# Patient Record
Sex: Female | Born: 1935 | Race: White | Hispanic: No | Marital: Married | Smoking: Former smoker
Health system: Southern US, Community
[De-identification: ages and names within clinical notes are randomized; demographics above are authoritative.]

## PROBLEM LIST (undated history)

## (undated) DIAGNOSIS — I10 Essential (primary) hypertension: Secondary | ICD-10-CM

## (undated) DIAGNOSIS — K2970 Gastritis, unspecified, without bleeding: Secondary | ICD-10-CM

## (undated) DIAGNOSIS — N200 Calculus of kidney: Secondary | ICD-10-CM

## (undated) DIAGNOSIS — T7840XA Allergy, unspecified, initial encounter: Secondary | ICD-10-CM

## (undated) DIAGNOSIS — D649 Anemia, unspecified: Secondary | ICD-10-CM

## (undated) DIAGNOSIS — E079 Disorder of thyroid, unspecified: Secondary | ICD-10-CM

## (undated) DIAGNOSIS — Z8719 Personal history of other diseases of the digestive system: Secondary | ICD-10-CM

## (undated) DIAGNOSIS — F419 Anxiety disorder, unspecified: Secondary | ICD-10-CM

## (undated) DIAGNOSIS — E213 Hyperparathyroidism, unspecified: Secondary | ICD-10-CM

## (undated) DIAGNOSIS — E669 Obesity, unspecified: Secondary | ICD-10-CM

## (undated) DIAGNOSIS — M1990 Unspecified osteoarthritis, unspecified site: Secondary | ICD-10-CM

## (undated) DIAGNOSIS — F329 Major depressive disorder, single episode, unspecified: Secondary | ICD-10-CM

## (undated) DIAGNOSIS — Z9889 Other specified postprocedural states: Secondary | ICD-10-CM

## (undated) DIAGNOSIS — G544 Lumbosacral root disorders, not elsewhere classified: Secondary | ICD-10-CM

## (undated) DIAGNOSIS — F32A Depression, unspecified: Secondary | ICD-10-CM

## (undated) DIAGNOSIS — M810 Age-related osteoporosis without current pathological fracture: Secondary | ICD-10-CM

## (undated) DIAGNOSIS — E785 Hyperlipidemia, unspecified: Secondary | ICD-10-CM

## (undated) DIAGNOSIS — K5730 Diverticulosis of large intestine without perforation or abscess without bleeding: Secondary | ICD-10-CM

## (undated) DIAGNOSIS — J479 Bronchiectasis, uncomplicated: Secondary | ICD-10-CM

## (undated) DIAGNOSIS — H409 Unspecified glaucoma: Secondary | ICD-10-CM

## (undated) DIAGNOSIS — K219 Gastro-esophageal reflux disease without esophagitis: Secondary | ICD-10-CM

## (undated) DIAGNOSIS — H269 Unspecified cataract: Secondary | ICD-10-CM

## (undated) DIAGNOSIS — G4730 Sleep apnea, unspecified: Secondary | ICD-10-CM

## (undated) DIAGNOSIS — R112 Nausea with vomiting, unspecified: Secondary | ICD-10-CM

## (undated) DIAGNOSIS — E210 Primary hyperparathyroidism: Secondary | ICD-10-CM

## (undated) DIAGNOSIS — E119 Type 2 diabetes mellitus without complications: Secondary | ICD-10-CM

## (undated) HISTORY — DX: Hyperparathyroidism, unspecified: E21.3

## (undated) HISTORY — PX: EYE SURGERY: SHX253

## (undated) HISTORY — DX: Anxiety disorder, unspecified: F41.9

## (undated) HISTORY — DX: Disorder of thyroid, unspecified: E07.9

## (undated) HISTORY — DX: Calculus of kidney: N20.0

## (undated) HISTORY — DX: Gastro-esophageal reflux disease without esophagitis: K21.9

## (undated) HISTORY — DX: Bronchiectasis, uncomplicated: J47.9

## (undated) HISTORY — DX: Unspecified osteoarthritis, unspecified site: M19.90

## (undated) HISTORY — DX: Diverticulosis of large intestine without perforation or abscess without bleeding: K57.30

## (undated) HISTORY — DX: Personal history of other diseases of the digestive system: Z87.19

## (undated) HISTORY — DX: Type 2 diabetes mellitus without complications: E11.9

## (undated) HISTORY — DX: Lumbosacral root disorders, not elsewhere classified: G54.4

## (undated) HISTORY — PX: UPPER GASTROINTESTINAL ENDOSCOPY: SHX188

## (undated) HISTORY — DX: Hyperlipidemia, unspecified: E78.5

## (undated) HISTORY — DX: Unspecified cataract: H26.9

## (undated) HISTORY — DX: Anemia, unspecified: D64.9

## (undated) HISTORY — DX: Depression, unspecified: F32.A

## (undated) HISTORY — DX: Essential (primary) hypertension: I10

## (undated) HISTORY — DX: Allergy, unspecified, initial encounter: T78.40XA

## (undated) HISTORY — DX: Unspecified glaucoma: H40.9

## (undated) HISTORY — DX: Sleep apnea, unspecified: G47.30

## (undated) HISTORY — DX: Gastritis, unspecified, without bleeding: K29.70

---
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
## 1972-10-02 HISTORY — PX: APPENDECTOMY: SHX54

## 1972-10-02 HISTORY — PX: TUBAL LIGATION: SHX77

## 1986-10-02 HISTORY — PX: BREAST BIOPSY: SHX20

## 1998-03-12 ENCOUNTER — Other Ambulatory Visit: Admission: RE | Admit: 1998-03-12 | Discharge: 1998-03-12 | Payer: Self-pay | Admitting: Obstetrics and Gynecology

## 1998-08-16 ENCOUNTER — Other Ambulatory Visit: Admission: RE | Admit: 1998-08-16 | Discharge: 1998-08-16 | Payer: Self-pay | Admitting: *Deleted

## 1998-10-02 HISTORY — PX: CHOLECYSTECTOMY: SHX55

## 1998-10-02 HISTORY — PX: ROTATOR CUFF REPAIR: SHX139

## 1998-10-08 ENCOUNTER — Encounter: Payer: Self-pay | Admitting: General Surgery

## 1998-10-13 ENCOUNTER — Ambulatory Visit (HOSPITAL_COMMUNITY): Admission: RE | Admit: 1998-10-13 | Discharge: 1998-10-14 | Payer: Self-pay | Admitting: General Surgery

## 1999-02-25 ENCOUNTER — Observation Stay (HOSPITAL_COMMUNITY): Admission: RE | Admit: 1999-02-25 | Discharge: 1999-02-26 | Payer: Self-pay | Admitting: Orthopedic Surgery

## 1999-06-02 ENCOUNTER — Other Ambulatory Visit: Admission: RE | Admit: 1999-06-02 | Discharge: 1999-06-02 | Payer: Self-pay | Admitting: Obstetrics and Gynecology

## 2000-06-25 ENCOUNTER — Other Ambulatory Visit: Admission: RE | Admit: 2000-06-25 | Discharge: 2000-06-25 | Payer: Self-pay | Admitting: Obstetrics and Gynecology

## 2000-06-29 ENCOUNTER — Encounter (INDEPENDENT_AMBULATORY_CARE_PROVIDER_SITE_OTHER): Payer: Self-pay | Admitting: Specialist

## 2000-06-29 ENCOUNTER — Other Ambulatory Visit: Admission: RE | Admit: 2000-06-29 | Discharge: 2000-06-29 | Payer: Self-pay | Admitting: Obstetrics and Gynecology

## 2001-06-20 ENCOUNTER — Encounter: Payer: Self-pay | Admitting: Orthopedic Surgery

## 2001-06-20 ENCOUNTER — Encounter: Admission: RE | Admit: 2001-06-20 | Discharge: 2001-06-20 | Payer: Self-pay | Admitting: Orthopedic Surgery

## 2001-06-26 ENCOUNTER — Other Ambulatory Visit: Admission: RE | Admit: 2001-06-26 | Discharge: 2001-06-26 | Payer: Self-pay | Admitting: Obstetrics and Gynecology

## 2002-06-30 ENCOUNTER — Other Ambulatory Visit: Admission: RE | Admit: 2002-06-30 | Discharge: 2002-06-30 | Payer: Self-pay | Admitting: Obstetrics and Gynecology

## 2002-10-02 HISTORY — PX: KNEE ARTHROPLASTY: SHX992

## 2002-11-19 DIAGNOSIS — K5732 Diverticulitis of large intestine without perforation or abscess without bleeding: Secondary | ICD-10-CM | POA: Insufficient documentation

## 2003-07-02 ENCOUNTER — Encounter: Payer: Self-pay | Admitting: Orthopedic Surgery

## 2003-07-03 ENCOUNTER — Other Ambulatory Visit: Admission: RE | Admit: 2003-07-03 | Discharge: 2003-07-03 | Payer: Self-pay | Admitting: Obstetrics and Gynecology

## 2003-07-06 ENCOUNTER — Inpatient Hospital Stay (HOSPITAL_COMMUNITY): Admission: RE | Admit: 2003-07-06 | Discharge: 2003-07-11 | Payer: Self-pay | Admitting: Orthopedic Surgery

## 2006-10-02 HISTORY — PX: COLONOSCOPY: SHX174

## 2006-10-02 HISTORY — PX: ESOPHAGOGASTRODUODENOSCOPY: SHX1529

## 2006-10-02 HISTORY — PX: LUMBAR FUSION: SHX111

## 2007-01-01 ENCOUNTER — Encounter: Admission: RE | Admit: 2007-01-01 | Discharge: 2007-01-01 | Payer: Self-pay | Admitting: Rheumatology

## 2007-02-12 ENCOUNTER — Encounter: Admission: RE | Admit: 2007-02-12 | Discharge: 2007-02-12 | Payer: Self-pay | Admitting: Rheumatology

## 2007-03-05 ENCOUNTER — Encounter: Admission: RE | Admit: 2007-03-05 | Discharge: 2007-03-05 | Payer: Self-pay | Admitting: Rheumatology

## 2007-03-25 ENCOUNTER — Encounter: Admission: RE | Admit: 2007-03-25 | Discharge: 2007-03-25 | Payer: Self-pay | Admitting: Rheumatology

## 2007-04-23 ENCOUNTER — Encounter: Admission: RE | Admit: 2007-04-23 | Discharge: 2007-04-23 | Payer: Self-pay | Admitting: Rheumatology

## 2007-06-20 ENCOUNTER — Ambulatory Visit (HOSPITAL_COMMUNITY): Admission: RE | Admit: 2007-06-20 | Discharge: 2007-06-20 | Payer: Self-pay | Admitting: Internal Medicine

## 2007-07-09 ENCOUNTER — Ambulatory Visit: Payer: Self-pay | Admitting: Gastroenterology

## 2007-07-17 ENCOUNTER — Ambulatory Visit: Payer: Self-pay | Admitting: Gastroenterology

## 2007-07-17 DIAGNOSIS — K2970 Gastritis, unspecified, without bleeding: Secondary | ICD-10-CM | POA: Insufficient documentation

## 2007-07-17 DIAGNOSIS — K2990 Gastroduodenitis, unspecified, without bleeding: Secondary | ICD-10-CM

## 2007-07-17 DIAGNOSIS — K5730 Diverticulosis of large intestine without perforation or abscess without bleeding: Secondary | ICD-10-CM

## 2007-07-17 HISTORY — DX: Diverticulosis of large intestine without perforation or abscess without bleeding: K57.30

## 2007-07-17 HISTORY — DX: Gastritis, unspecified, without bleeding: K29.70

## 2007-08-22 ENCOUNTER — Inpatient Hospital Stay (HOSPITAL_COMMUNITY): Admission: RE | Admit: 2007-08-22 | Discharge: 2007-08-28 | Payer: Self-pay | Admitting: Neurosurgery

## 2007-08-27 ENCOUNTER — Ambulatory Visit: Payer: Self-pay | Admitting: Physical Medicine & Rehabilitation

## 2007-10-03 HISTORY — PX: PARATHYROIDECTOMY: SHX19

## 2007-10-17 ENCOUNTER — Encounter (INDEPENDENT_AMBULATORY_CARE_PROVIDER_SITE_OTHER): Payer: Self-pay | Admitting: Surgery

## 2007-10-17 ENCOUNTER — Ambulatory Visit (HOSPITAL_COMMUNITY): Admission: RE | Admit: 2007-10-17 | Discharge: 2007-10-18 | Payer: Self-pay | Admitting: Surgery

## 2007-12-11 DIAGNOSIS — K620 Anal polyp: Secondary | ICD-10-CM | POA: Insufficient documentation

## 2007-12-11 DIAGNOSIS — F32A Depression, unspecified: Secondary | ICD-10-CM | POA: Insufficient documentation

## 2007-12-11 DIAGNOSIS — K589 Irritable bowel syndrome without diarrhea: Secondary | ICD-10-CM | POA: Insufficient documentation

## 2007-12-11 DIAGNOSIS — Z85820 Personal history of malignant melanoma of skin: Secondary | ICD-10-CM | POA: Insufficient documentation

## 2007-12-11 DIAGNOSIS — N200 Calculus of kidney: Secondary | ICD-10-CM | POA: Insufficient documentation

## 2007-12-11 DIAGNOSIS — E785 Hyperlipidemia, unspecified: Secondary | ICD-10-CM | POA: Insufficient documentation

## 2007-12-11 DIAGNOSIS — F341 Dysthymic disorder: Secondary | ICD-10-CM

## 2007-12-11 DIAGNOSIS — F329 Major depressive disorder, single episode, unspecified: Secondary | ICD-10-CM | POA: Insufficient documentation

## 2007-12-11 DIAGNOSIS — I10 Essential (primary) hypertension: Secondary | ICD-10-CM | POA: Insufficient documentation

## 2007-12-11 DIAGNOSIS — K621 Rectal polyp: Secondary | ICD-10-CM

## 2008-01-01 ENCOUNTER — Encounter: Admission: RE | Admit: 2008-01-01 | Discharge: 2008-01-01 | Payer: Self-pay | Admitting: Surgery

## 2009-06-01 ENCOUNTER — Encounter (HOSPITAL_COMMUNITY): Admission: RE | Admit: 2009-06-01 | Discharge: 2009-08-06 | Payer: Self-pay | Admitting: Surgery

## 2009-07-12 ENCOUNTER — Encounter (INDEPENDENT_AMBULATORY_CARE_PROVIDER_SITE_OTHER): Payer: Self-pay | Admitting: Surgery

## 2009-07-12 ENCOUNTER — Ambulatory Visit (HOSPITAL_COMMUNITY): Admission: RE | Admit: 2009-07-12 | Discharge: 2009-07-13 | Payer: Self-pay | Admitting: Surgery

## 2009-09-06 ENCOUNTER — Encounter: Admission: RE | Admit: 2009-09-06 | Discharge: 2009-09-06 | Payer: Self-pay | Admitting: Internal Medicine

## 2009-09-17 ENCOUNTER — Encounter: Admission: RE | Admit: 2009-09-17 | Discharge: 2009-09-17 | Payer: Self-pay | Admitting: Internal Medicine

## 2009-12-13 ENCOUNTER — Ambulatory Visit (HOSPITAL_COMMUNITY): Admission: RE | Admit: 2009-12-13 | Discharge: 2009-12-13 | Payer: Self-pay | Admitting: Surgery

## 2010-01-31 ENCOUNTER — Ambulatory Visit (HOSPITAL_COMMUNITY): Admission: RE | Admit: 2010-01-31 | Discharge: 2010-01-31 | Payer: Self-pay | Admitting: Surgery

## 2010-02-02 ENCOUNTER — Ambulatory Visit (HOSPITAL_COMMUNITY): Admission: RE | Admit: 2010-02-02 | Discharge: 2010-02-02 | Payer: Self-pay | Admitting: Surgery

## 2010-05-18 ENCOUNTER — Ambulatory Visit (HOSPITAL_COMMUNITY): Admission: RE | Admit: 2010-05-18 | Discharge: 2010-05-18 | Payer: Self-pay | Admitting: Surgery

## 2010-06-03 ENCOUNTER — Encounter (INDEPENDENT_AMBULATORY_CARE_PROVIDER_SITE_OTHER): Payer: Self-pay | Admitting: Surgery

## 2010-06-03 ENCOUNTER — Ambulatory Visit (HOSPITAL_COMMUNITY): Admission: RE | Admit: 2010-06-03 | Discharge: 2010-06-04 | Payer: Self-pay | Admitting: Surgery

## 2010-10-23 ENCOUNTER — Encounter: Payer: Self-pay | Admitting: Surgery

## 2010-12-15 LAB — CALCIUM
Calcium: 10.1 mg/dL (ref 8.4–10.5)
Calcium: 9.9 mg/dL (ref 8.4–10.5)

## 2010-12-15 LAB — DIFFERENTIAL
Basophils Absolute: 0 10*3/uL (ref 0.0–0.1)
Basophils Relative: 0 % (ref 0–1)
Eosinophils Absolute: 0.2 10*3/uL (ref 0.0–0.7)
Lymphs Abs: 1.8 10*3/uL (ref 0.7–4.0)
Neutrophils Relative %: 55 % (ref 43–77)

## 2010-12-15 LAB — BASIC METABOLIC PANEL
CO2: 27 mEq/L (ref 19–32)
Calcium: 11.2 mg/dL — ABNORMAL HIGH (ref 8.4–10.5)
Chloride: 107 mEq/L (ref 96–112)
Creatinine, Ser: 1.01 mg/dL (ref 0.4–1.2)
Potassium: 4.5 mEq/L (ref 3.5–5.1)
Sodium: 140 mEq/L (ref 135–145)

## 2010-12-15 LAB — PROTIME-INR
INR: 1.06 (ref 0.00–1.49)
Prothrombin Time: 14 seconds (ref 11.6–15.2)

## 2010-12-15 LAB — URINALYSIS, ROUTINE W REFLEX MICROSCOPIC
Bilirubin Urine: NEGATIVE
Ketones, ur: NEGATIVE mg/dL
Nitrite: NEGATIVE
Protein, ur: NEGATIVE mg/dL
Urobilinogen, UA: 0.2 mg/dL (ref 0.0–1.0)

## 2010-12-15 LAB — SURGICAL PCR SCREEN: MRSA, PCR: NEGATIVE

## 2010-12-15 LAB — CBC
MCH: 31.7 pg (ref 26.0–34.0)
MCHC: 34.6 g/dL (ref 30.0–36.0)
Platelets: 278 10*3/uL (ref 150–400)
RBC: 4.71 MIL/uL (ref 3.87–5.11)

## 2010-12-16 LAB — CREATININE, SERUM
Creatinine, Ser: 1.24 mg/dL — ABNORMAL HIGH (ref 0.4–1.2)
GFR calc non Af Amer: 43 mL/min — ABNORMAL LOW (ref >60)

## 2010-12-16 LAB — BUN: BUN: 21 mg/dL (ref 6–23)

## 2010-12-20 LAB — MISCELLANEOUS TEST

## 2010-12-20 LAB — CBC
MCV: 91.3 fL (ref 78.0–100.0)
Platelets: 241 10*3/uL (ref 150–400)
RDW: 13 % (ref 11.5–15.5)
WBC: 4.2 10*3/uL (ref 4.0–10.5)

## 2010-12-20 LAB — PROTIME-INR: Prothrombin Time: 14.7 seconds (ref 11.6–15.2)

## 2010-12-20 LAB — BASIC METABOLIC PANEL
BUN: 16 mg/dL (ref 6–23)
Chloride: 105 mEq/L (ref 96–112)
Creatinine, Ser: 0.9 mg/dL (ref 0.4–1.2)
GFR calc non Af Amer: >60 mL/min (ref >60)
Glucose, Bld: 116 mg/dL — ABNORMAL HIGH (ref 70–99)

## 2011-01-05 LAB — CBC
HCT: 42.2 % (ref 36.0–46.0)
MCHC: 34.4 g/dL (ref 30.0–36.0)
MCV: 91.5 fL (ref 78.0–100.0)
RBC: 4.61 MIL/uL (ref 3.87–5.11)
WBC: 7.3 10*3/uL (ref 4.0–10.5)

## 2011-01-05 LAB — DIFFERENTIAL
Basophils Relative: 1 % (ref 0–1)
Eosinophils Absolute: 0.1 10*3/uL (ref 0.0–0.7)
Eosinophils Relative: 2 % (ref 0–5)
Lymphs Abs: 3 10*3/uL (ref 0.7–4.0)
Monocytes Absolute: 0.8 10*3/uL (ref 0.1–1.0)
Monocytes Relative: 11 % (ref 3–12)

## 2011-01-05 LAB — URINALYSIS, ROUTINE W REFLEX MICROSCOPIC
Glucose, UA: NEGATIVE mg/dL
Hgb urine dipstick: NEGATIVE
Ketones, ur: NEGATIVE mg/dL
Protein, ur: NEGATIVE mg/dL
Urobilinogen, UA: 1 mg/dL (ref 0.0–1.0)

## 2011-01-05 LAB — URINE MICROSCOPIC-ADD ON

## 2011-01-05 LAB — BASIC METABOLIC PANEL
CO2: 30 mEq/L (ref 19–32)
Chloride: 100 mEq/L (ref 96–112)
GFR calc Af Amer: >60 mL/min (ref >60)
Potassium: 4.6 mEq/L (ref 3.5–5.1)

## 2011-02-14 NOTE — Op Note (Signed)
[name], [name]                ACCOUNT NO.:  [id number]   MEDICAL RECORD NO.:  [id number]          PATIENT TYPE:  AMB   LOCATION:  DAY                          FACILITY:  [facility]   PHYSICIAN:  Velora Heckler, MD      DATE OF BIRTH:  07/05/1935   DATE OF PROCEDURE:  DATE OF DISCHARGE:                               OPERATIVE REPORT   PREOPERATIVE DIAGNOSIS:  Primary hyperparathyroidism.   POSTOPERATIVE DIAGNOSIS:  Primary hyperparathyroidism.   PROCEDURES:  1. Neck exploration.  2. Right inferior parathyroidectomy for adenoma.  3. Left superior parathyroidectomy.  4. Biopsy left inferior parathyroid.   SURGEON:  Velora Heckler, M.D., FACS   ASSISTANT:  RNFA.   ANESTHESIA:  General per Hezzie Bump. Okey Dupre, M.D.   ESTIMATED BLOOD LOSS:  Minimal.   PREPARATION:  Betadine.   COMPLICATIONS:  None.   INDICATIONS:  The patient is 76 year old white female from [place],  [place].  She is referred by Dr. Geoffry Paradise for primary  hyperparathyroidism.  Calcium levels have ranged from 10.8 to 11.2.  Intact PTH level was elevated to 103.  Sestamibi scan localized a left  inferior parathyroid adenoma.  The patient now comes to surgery for  parathyroidectomy.   BODY OF REPORT:  Procedure was done in OR #1 at [facility]  The patient was brought to the operating room and placed in  supine position on the operating room table.  Following administration  of general anesthesia, the patient was positioned and then prepped and  draped in the usual strict aseptic fashion.   After ascertaining that an adequate level of anesthesia had been  achieved, a left inferior neck incision was made with a #15 blade.  Dissection was carried through subcutaneous tissues and platysma.  Hemostasis was obtained with the electrocautery.  Skin flaps were  elevated cephalad and caudad.  A Weitlaner retractor was placed for  exposure.  Strap muscles were incised in the midline and  reflected  laterally.  The left thyroid lobe was exposed.  Inferior pole was  dissected out.  Venous tributaries were divided between small Ligaclips.  The area beneath the left thyroid lobe was then explored.   The tracheoesophageal groove was gently dissected out.  The thyroid  thymic tract was opened.  Dissection was carried posteriorly along the  side of the esophagus to the precervical fascia.  The retroesophageal  space was opened.  The carotid sheath was opened.  No obvious adenoma  was identified.  A normal parathyroid gland was identified at the  inferior pole of the left lobe of the thyroid.  Using a medium Ligaclip,  the gland was transected and a biopsy submitted to Pathology, which  confirmed parathyroid tissue.  The incision was slightly extended and  the entire left lobe was mobilized.  Superior pole vessels were  mobilized.  Dissection superiorly revealed what appeared to be a  slightly enlarged parathyroid gland.  This was gently dissected out.  Vascular pedicle was divided between Ligaclips and the gland was  excised.  It did contain a nodular density measuring  approximately 3 mm  in diameter.  The entire left superior parathyroid gland was submitted  to Pathology for frozen section.  This confirmed parathyroid tissue, but  evidence of adenoma was not identified.   Therefore, we proceeded with exploration of the right neck.  The  incision was extended across the midline.  Strap muscles were reflected  to the right and the right lobe was exposed.  Venous tributaries were  divided between small Ligaclips.  The gland was mobilized inferiorly,  and the area beneath the right lobe of the gland was explored.  In the  tracheoesophageal groove, a soft-tissue mass was identified.  This was  gently dissected out and mobilized from surrounding tissues.  Vascular  pedicle was divided between medium Ligaclips and the mass was excised.  This measured approximately 1.5 cm in greatest  dimension.  It was  submitted to Pathology.  The gland weighed over 1500 mg and had the  appearance of parathyroid adenoma.  The right neck was irrigated and  good hemostasis noted.  Surgicel was placed in the operative field.  Left side was irrigated and good hemostasis noted.  Surgicel was placed  on the left side of the neck, as well.   Strap muscles were then reapproximated in the midline with interrupted 3-  0 Vicryl sutures.  Platysma was closed with interrupted 3-0 Vicryl  sutures.  Skin was closed with running 4-0 Monocryl subcuticular suture.  Wound was washed and dried, and benzoin and Steri-Strips were applied.  Sterile dressings were applied.  The patient was awakened from  anesthesia and brought to the recovery room in stable condition.  The  patient tolerated the procedure well.      Velora Heckler, MD  Electronically Signed     TMG/MEDQ  D:  10/17/2007  T:  10/17/2007  Job:  [id number]   cc:   Geoffry Paradise, M.D.  Fax: [phone]   Hewitt Shorts, M.D.  Fax: [phone]

## 2011-02-14 NOTE — Assessment & Plan Note (Signed)
[facility]                         GASTROENTEROLOGY OFFICE NOTE   NAME:[name], [name]                       MRN:          [id number]  DATE:07/09/2007                            DOB:          1935/09/05    Mrs. [name] is a 76 year old white female referred through the courtesy  of Dr. Jacky Kindle for evaluation of guaiac-positive stools on  recent  Hemoccults on June 04, 2007.  A CBC at that time was normal.   [name] really has no GI complaints except for alternating diarrhea and  constipation, chronic IBS.  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  Her last colonoscopy  was approximately 4 years ago.  She currently does have some  intermittent rectal bleeding and has perianal polyps for which I have  referred her previously to Dr. Kendrick Ranch for evaluation.  Apparently, he  did not feel that she needed excision of these polyps.  She has  additionally seen Dr. Ginette Pitman and has had cholecystectomy within the  last several years.  The patient will occasionally have some bright red  blood with frequent stools, but denies melena or blood mixed in her  stools.  She has no abdominal pain but does have chronic acid reflux  with belching, burping, substernal pain, unless she takes Nexium 40 mg a  day.  She also had endoscopy done approximately 5 years ago.  She has  had previous cholecystectomy, and has known fatty infiltration of her  liver, and has slightly abnormal liver function tests.  I do not think  she has had previous liver biopsy.  She currently is asymptomatic  without other GI complaints.  Her appetite is good and her weight is  stable, and she denies food intolerances.  She does have degenerative  arthritis and chronic low back pain.  She has recently been on NSAIDS,  but these have been discontinued.  She currently is on a  steroid dose  pack per Dr. Kellie Simmering, and has an appointment with the neurosurgeon for  her lumbosacral spondylosis.  She is has had previous knee surgery by  Dr. Lequita Halt in October of 2004.   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  Additional  medical problems include 10 years of hypertension, hypercholesterolemia,  chronic anxiety and depression, recurrent kidney stones.   MEDICATIONS:  1. Avapro 150 mg a day.  2. Nexium 40 mg a day.  3. Triamterine/hydrochlorothiazide 37.5 mg a day.  4. Temazepam 15 mg at bedtime.  5. Cymbalta 60 mg a day.  6. Prednisone dose pack currently.  7. Darvocet-N 100 p.r.n.   She has severe nausea with CODEINE use.   FAMILY HISTORY:  Unremarkable in terms of any known gastrointestinal  problems.   SOCIAL HISTORY:  She is married and  lives with her husband.  She has 1  year of college education, is retired from her job teaching school.  She  does not smoke and uses ethanol rarely.  She denies problems with  ethanol or drug dependency.   REVIEW OF SYSTEMS:  Remarkable for chronic low back pain and various  arthralgias.  She also complains of chronic fatigue, but denies any  currently cardiovascular, pulmonary, or genitourinary problems.  She  recently been diagnosed with mild hypercalcemia, and has been diagnosed  with hypoparathyroidism, and has a parathyroid adenoma detected on  scanning and is scheduled to see Dr. Gerrit Friends in December.   Review of systems, otherwise, noncontributory.   PHYSICAL EXAMINATION:  She is a healthy-appearing white female,  appearing her stated age, in no acute distress.  She is 5 feet 2-1/2 inches tall and weighs 231 pounds.  Blood pressure  122/70 and pulse was 80 and regular.  I could not appreciate stigmata of chronic liver disease or thyromegaly.  Her  chest was entirely clear to percussion and auscultation.  She appeared to be in a regular rhythm without murmurs, gallops, or  rubs.  I could not appreciate hepatosplenomegaly, abdominal masses or  tenderness.  Bowel sounds were normal.  Peripheral extremities were unremarkable.  Mental status was clear.  Inspection of the rectum did show what appeared to be a posterior anal  polyp without maceration or bleeding.  Also with some external  hemorrhoidal tissue, but no fissures or fistulae.  Rectal exam showed no masses or tenderness with normal-colored stool  which was trace guaiac positive.   ASSESSMENT:  1. Guaiac-positive stools, probably secondary to recurrent polyposis      in a 76 year old lady with a history of previous carcinoma in situ.  2. Guaiac-positive stool, possibly from heavy non-steroidal anti-      inflammatory drug use because of chronic low back pain.  She is      currently is on steroid dose pack.  3. Chronic gastroesophageal reflux disease with need for chronic      proton pump inhibitor therapy.  4. Status post cholecystectomy with long history of fatty infiltration      of the liver.  5. Degenerative arthritis involving her joints and spine.  6. Parathyroid adenoma with mild hypercalcemia.  7. History of recurrent kidney stones, perhaps related to #6.  8. Chronic anxiety and depression.  9. History of hyperlipidemia.  10.Status post right knee replacement.   RECOMMENDATIONS:  1. Outpatient endoscopy colonoscopy at her convenience.  2. High-fiber diet as tolerated.  3. Continue with multiple medications per Dr. Jacky Kindle.  4. The patient has been urged to keep appointment with Dr. Gerrit Friends      scheduled for December.     Vania Rea. Jarold Motto, MD, Caleen Essex, FAGA  Electronically Signed    DRP/MedQ  DD: 07/09/2007  DT: 07/09/2007  Job #: [id number]   cc:   Geoffry Paradise, M.D.  Aundra Dubin, M.D.  Ollen Gross, M.D.  Velora Heckler, MD

## 2011-02-14 NOTE — H&P (Signed)
NAMEJAKAYA, [name]                ACCOUNT NO.:  [id number]   MEDICAL RECORD NO.:  [id number]          PATIENT TYPE:  INP   LOCATION:  3031                         FACILITY:  [facility]   PHYSICIAN:  [name], M.D.DATE OF BIRTH:  May 31, 1935   DATE OF ADMISSION:  08/22/2007  DATE OF DISCHARGE:                              HISTORY & PHYSICAL   HISTORY OF PRESENT ILLNESS:  The patient is a 76 year old right-handed  white female who was evaluated for low back and bilateral lumbar  radicular pain becoming worse through the left lower extremity as  compared to the right lower extremity that is extending up into the  buttock, posterior thigh, and into the leg associated with numbness and  tingling, particularly in the left foot, treated with several courses of  prednisone without relief.  This patient was studied with MRI scan which  revealed anterolisthesis at L4 and L5.  X-rays show a dynamic grade 1  listhesis, and the MRI further reveals significant left L4-L5 facet  capsular hypertrophy with ligamentum flavum hypertrophy and possibly an  underlying synovial cyst with left L4-L5 lateral recess stenosis.  The  patient is admitted now for decompression and stabilization.   PAST MEDICAL HISTORY:  Notable for history of hypertension for 10 years,  osteoarthritis, a melanoma removed from her back in 1994, and  gastroesophageal reflux disease.  In August 2008, Dr. Geoffry Paradise,  her primary physician, found a calcium of 10.9.  He had her undergo a  parathyroid scan which showed a solitary parathyroid adenoma, and she is  to see Dr. Darnell Level for consultation.  However, her calcium was  checked this morning prior to surgery is 10.1.  Dr. Hart Robinsons,  the anesthesiologist, feels that we can proceed with surgery.  She does  not describe any history of myocardial infarction, stroke, diabetes, or  lung disease.   PREVIOUS SURGICAL HISTORY:  Two cesarean sections in 1991 and  1994,  removal of cyst from the left breast in 1998, removal of a melanoma in  1994, cholecystectomy in 1998, LASIK surgery bilaterally in 1999, right  rotator cuff surgery in 2000, right total knee replacement in 2004, left  upper leg vein laser surgery 2006, and left lower leg laser vein surgery  in 2006.   She reports allergies to CODEINE causing nausea and vomiting, IV  CONTRAST causing hives, itching, and shortness of breath.   MEDICATIONS:  1. Avapro 150 mg daily.  2. Triamcinolone/hydrochlorothiazide 37.5/uncertain dose half-a-tablet      daily.  3. Nexium 40 mg daily.  4. Temazepam 50 mg q.h.s.  5. Cymbalta 60 mg daily.  6. Cosopt eye drops in each eye b.i.d.  7. Darvocet p.r.n.  8. Ibuprofen p.r.n.   FAMILY HISTORY:  Mother died at age 66; she had hypertension,  osteoarthritis, and trigeminal neuralgia which I operated on 20 years  ago.  Father died at age 54; had hypertension, diabetes, and mental  disorder.  There is also a family history of stroke.   SOCIAL HISTORY:  The patient is a retired Academic librarian.  She is  married.  She does not smoke.  She did smoke from age 30 to age 65.  She  quit over 20 years ago.  She does not have a history of substance abuse.  She does drink alcohol, but just socially.   REVIEW OF SYSTEMS:  Notable for what is described in the history of  present illness and past medical history.  It is otherwise normal,  otherwise notable only for a 25-pound weight loss due to diminished  appetite secondary to some of for medications.   PHYSICAL EXAMINATION:  GENERAL:  The patient is an obese white female in  no acute distress.  VITAL SIGNS:  Temperature is 97.5, pulse 85, blood pressure 142/87,  respiratory rate 20, height 5 feet, and weight 140 kg.  LUNGS:  Clear to auscultation.  She has symmetrical respiratory  excursion.  HEART:  Regular rate and rhythm.  Normal S1 and S2.  There is no murmur.  EXTREMITIES:  No clubbing, cyanosis,  or edema.  MUSCULOSKELETAL:  No tenderness to palpation of the lumbar spinous  process or paralumbar musculature.  Straight leg raising bilaterally.  Her mobility is limited in the low back.  Flexion is limited at 60  degrees and extension is limited to 5 or 10 degrees, both are limited by  pain.  NEUROLOGIC:  Shows 5/5 strength in the lower extremities including the  area of iliopsoas, quadriceps, gastrocnemius, extensor hallucis longus,  and plantar flexor bilaterally.  Sensation is intact to pinprick in the  upper and lower extremities.  Reflexes are absent at biceps, brachialis,  triceps, and minimally at the quadriceps and gastrocnemius, symmetrical  bilaterally.  Toes are downgoing bilaterally.  She has normal gait and  stance.   IMPRESSION:  The patient with low back and bilateral lumbar radicular  pain, left worse than right.  Motor and sensory function intact.  She  has left L4-L5 lateral recess stenosis and dynamic degenerative grade 1  spondylolisthesis at L4-L5 and a possibly left L4-L5 synovial cyst.   PLAN:  The patient is admitted for bilateral L4-L5 lumbar laminotomy,  foraminotomy, facetectomy, and resection of left L4-L5 synovial cyst,  and bilateral L4-L5 posterior lumbar interbody fusion with interbody  instruments and bone graft and bilateral L4-L5 posterolateral  arthrodesis with posterior instrumentation and bone graft.  We discussed  the alternatives for surgery, nature of the surgical procedure itself,  difficulties with surgery, hospitalization stay and rehabilitation,  limitations postoperatively, and risks that include infection, bleeding,  possible transfusion, the risk of nerve dysfunction, weakness, numbness  or paresthesias, the risk of dural tear or leak with  possible need for  further surgery, risk of failure of the arthrodesis with possible need  for further surgery, anesthetic risks, myocardial infarction, stroke,  pneumonia and death.  After  discussing all of these, the patient would  like to have a surgery and is admitted for such.      [name], M.D.  Electronically Signed     RWN/MEDQ  D:  08/22/2007  T:  08/23/2007  Job:  [id number]

## 2011-02-14 NOTE — Op Note (Signed)
[name], [name]                ACCOUNT NO.:  [id number]   MEDICAL RECORD NO.:  [id number]          PATIENT TYPE:  INP   LOCATION:  3031                         FACILITY:  [facility]   PHYSICIAN:  Hewitt Shorts, M.D.DATE OF BIRTH:  10-14-1934   DATE OF PROCEDURE:  08/22/2007  DATE OF DISCHARGE:                               OPERATIVE REPORT   PREOPERATIVE DIAGNOSES:  Left L4-5 synovial cyst; bilateral L4-5 lateral  recess stenosis, left worse than right; L4-5 dynamic degenerative  spondylolisthesis grade 1, and neurogenic claudication.   POSTOPERATIVE DIAGNOSES:  Left L4-5 synovial cyst; bilateral L4-5  lateral recess stenosis, left worse than right; L4-5 dynamic  degenerative spondylolisthesis grade 1, and neurogenic claudication.   PROCEDURES:  Bilateral L4-5 lumbar laminotomies, facetectomy,  foraminotomy, and microsurgical removal of partially calcified left L4-5  synovial cyst with microdissection and decompression of the exiting L4  and L5 nerve roots bilaterally; bilateral L4-5 posterior lumbar  interbody fusion with interbody PEEK implants, mosaic synthetic bone  graft and bone marrow aspirate; and bilateral L4-5 posterolateral  arthrodesis with radius posterior instrumentation and mosaic with bone  marrow aspirate.   SURGEON:  Hewitt Shorts, M.D.   ASSISTANT:  Nelia Shi. Georgina Peer and Stefani Dama, M.D.   ANESTHESIA:  General endotracheal.   INDICATIONS:  The patient is a 76 year old woman who presented with low  back and bilateral lumbar radicular pain, left worse than right.  She is  found to have bilateral L4-5 lateral recess stenosis, worse on the left  than the right with a large synovial cyst on the left, and with a  dynamic degenerative spondylolisthesis at L4 and L5, grade 1.  Decision  was made to proceed with decompression arthrodesis.   PROCEDURE:  The patient was brought to the operating room and placed  under general endotracheal anesthesia.   The patient was turned to a  prone position.  Lumbar region was prepped with Betadine scrub and  solution and draped in a sterile fashion.  The midline was infiltrated  with local anesthetic with epinephrine and a midline incision was made  and carried down to the subcutaneous tissue.  Bipolar coated  electrocautery was used to maintain hemostasis.  Dissection was carried  down through the lumbar fascia, which was incised bilaterally.  The  paraspinal muscles were dissected from the spinous process and lamina in  a subperiosteal fashion.  Self-retaining retractor was placed and x-rays  were taken and the L4-5 interlaminar space was identified.  With  magnification and microdissection and microsurgical technique, bilateral  L4-5 laminotomy, facetectomy, and foraminotomy were performed using the  X-Max drill and Kerrison punches.  Ligamentum flavum was thickened and  this was carefully removed, and on the left side, we found a large  partially calcified synovial cyst causing significant compression of the  lateral thecal sac as well as the exiting L5 and L4 nerve roots.  A  small synovial cyst was found on the right side.  Both were removed and  we were able to decompress the exiting L4 and L5 nerve roots  bilaterally.   We  then proceeded with the posterior lumbar interbody fusion.  The  overlying epidural veins were coagulated and then the annulus was  incised bilaterally.  A thorough discectomy was performed using a  variety of microcurette and pituitary rongeurs.  The cartilaginous  endplates of L4 and L5 were removed using microcurette and then we used  paddle curettes to scrape the cartilaginous endplates from the bony  surfaces down to a good bony surface for the arthrodesis.  We measured  the height of the interbody space and selected 13-mm in height implants  and we then probed the left L5 pedicle.  An aspirated bone marrow  aspirate was injected over a 15 mL strip of mosaic  synthetic bone graft.  Once that was saturated, the PEEK interbody implants were packed with  the mosaic with bone marrow aspirate, and then carefully retracting the  thecal sac, we placed the first implant on the right side and then we  came to the left side, packed additional mosaic in the midline, and then  placed a second implant to the left side.  Each of these was countersunk  to the posterior aspect of the vertebral body.  We then packed  additional mosaic with bone marrow aspirate lateral to each of the  implants within the disc space.  We then draped the C-arm fluoroscope.  It was brought into the field to provide guidance in placing the  posterior instrumentation.  Pedicle entry sites were selected  bilaterally at L4 as well as on the right side of the L5.  Each of the  pedicles was probed and examined with a ball probe.  Each of the  pedicles was tapped with a 5.25 mm tap.  Again, examined with the ball  probe, no cutouts were found, and we then placed 5.75 x 35 mm screws  bilaterally at each level.  Once all 4 screws were placed, an AP view  was taken as well and the screws were felt to be in good condition.  Then we selected 30-mm prelordosed rods.  They were placed within the  screw heads and secured with locking caps, which were subsequently  tightened to get the counter torque.  We previously exposed the  transverse process of L4 and L5 bilaterally.  These were decorticated  and then we packed mosaic with bone marrow aspirate in the lateral  gutters over the transverse processes and intertransverse space.  The  wound was irrigated numerous times through the procedure with saline and  subsequently with bacitracin solution.  Good hemostasis was confirmed  and then we proceeded with closure.  The deep fascia was closed with  interrupted undyed #1 Vicryl suture.  Scarpa fascia was closed with  interrupted undyed #1 Vicryl suture.  The subcutaneous and subcuticular  was closed  with interrupted inverted 2-0 undyed Vicryl sutures.  The  skin edges were closed with surgical staples.  The wound was dressed  with Adaptic and sterile gauze.  Procedure was tolerated well.  The  estimated blood loss was 250 mL.  We were able to return 60 mL of cell  saver blood to the patient.  Sponge and needle count were correct.  Following surgery, the patient was to be turned back to the supine  position, to be reversed from the anesthetic, extubated, and transferred  to the recovery room for further care.      Hewitt Shorts, M.D.  Electronically Signed     RWN/MEDQ  D:  08/22/2007  T:  08/23/2007  Job:  [id number]

## 2011-02-17 NOTE — H&P (Signed)
NAME:  [name], [name]                          ACCOUNT NO.:  [id number]   MEDICAL RECORD NO.:  [id number]                   PATIENT TYPE:  INP   LOCATION:  NA                                   FACILITY:  [facility]   PHYSICIAN:  Ollen Gross, M.D.                 DATE OF BIRTH:  12/27/1934   DATE OF ADMISSION:  07/06/2003  DATE OF DISCHARGE:                                HISTORY & PHYSICAL   DATE OF OFFICE VISIT HISTORY AND PHYSICAL:  June 30, 2003.   CHIEF COMPLAINT:  Right knee pain.   HISTORY OF PRESENT ILLNESS:  The patient is a 76 year old female who was  seen by Dr. Lequita Halt for ongoing right knee pain.  The patient has been  followed and found to have known arthritis in the right knee.  It has  progressively been getting worse over the past several months.  She has  undergone a series of Synvisc injections back in March of this year which  only gave her temporary relief for about a month now.  She is at a stage  where she is having pain on a daily basis and including pain at night.  It  is interfering with her activities of daily living and having a negative  effect on her lifestyle.  She was seen in the office and found to have end-  stage arthritis of the right knee with bone-on-bone changes.  It is felt she  would benefit from undergoing knee replacement.  The risks and benefits of  the procedure have been discussed with the patient, and she elects to  proceed with surgery.  She has been seen preoperatively by Dr. Jacky Kindle, who  felt she was medically stable for up and coming surgery.   ALLERGIES:  IVP DYE causes hives.   INTOLERANCES:  CODEINE makes her very sick (patient believes she has  tolerated hydrocodone in the past without difficulty.   CURRENT MEDICATIONS:  1. Avapro 150 mg daily.  2. Nexium 40 mg twice a day.  3. Triamterene/hydrochlorothiazide 37.5, 1/2 tablet daily.  4. Over-the-counter Claritin daily.  5. Ibuprofen, stopped prior to surgery.  6.  Acetaminophen p.r.n. pain.  7. Crestor 10 mg daily.   PAST MEDICAL HISTORY:  1. History of bronchitis in 2002.  2. History of pneumonia in 1993.  3. Hypertension.  4. Hypercholesterolemia.  5. Hiatal hernia.  6. Reflux disease.  7. Diverticulitis.  8. History of mild fatty liver with elevated liver function test in the     past.  9. History of urinary tract infections.  10.      History of cystitis.  11.      History of renal calculi.  12.      History of melanoma.  13.      Postmenopausal.  14.      Benign cystic breast disease.   PAST SURGICAL HISTORY:  1. Tonsillectomy  and adenoidectomy in 1945.  2. Cesarean section in 1971 and 1974.  3. Left breast cyst removal in 1988.  4. Right rotator cuff repair in 2000.  5. Cholecystectomy.  6. Melanoma excision in 1994.  7. Right knee arthroscopy in 2002.   SOCIAL HISTORY:  Married.  She is retired since 2000.  She previously worked  as a Academic librarian.  One-fourth pack per day smoker for approximately  30 years.  One to two drinks of wine per week.  She has two children.  Her  spouse will be assisting with her care after surgery.  She lives in a two-  story home with 14 steps going upstairs.   REVIEW OF SYSTEMS:  GENERAL:  No fevers, chills, or night sweats.  NEUROLOGIC:  No seizures, syncope, or paralysis.  RESPIRATORY:  She does  have an occasional productive cough.  Again, she denies any fevers, chills,  or night sweats at this time.  No shortness of breath.  CARDIOVASCULAR:  No  shortness of breath at rest.  She does have some occasional shortness of  breath on exercise.  No chest pain.  GASTROINTESTINAL:  She has had a  history of elevated liver function tests which have been up and down in the  past with a history of a mild fatty liver.  No nausea, vomiting, diarrhea,  or constipation.  No blood or mucous in the stool.  GENITOURINARY:  Some  occasional hematuria, none recently.  No dysuria or discharge.   MUSCULOSKELETAL:  Pertinent to that of the right knee found in the history  of present illness.   PHYSICAL EXAMINATION:  VITAL SIGNS:  Pulse 72, respirations 16, blood  pressure 120/72.  GENERAL:  The patient is a 76 year old white female, well-nourished, well-  developed, overweight.  In no acute distress.  She is alert and oriented and  cooperative.  Pleasant at the time of exam.  HEENT:  Normocephalic, atraumatic.  Pupils round and reactive.  EOMs are  intact.  NECK:  Supple.  No carotid bruits.  CHEST:  Clear to auscultation anterior and posterior chest walls.  HEART:  Regular rate and rhythm, without murmur.  S1, S2 noted.  ABDOMEN:  Soft, round, protuberant abdomen.  Bowel sounds are present.  RECTAL, BREASTS, GENITALIA:  Not done.  Not pertinent to present illness.  EXTREMITIES:  Significant to that of the right knee.  Range of motion lacks  about 5 degrees of full extension with flexion of up to 115 degrees.  There  is no instability.  She is tender over the medial joint line.  No effusion.   IMPRESSION:  1. Osteoarthritis right knee.  2. History of bronchitis in 2002.  3. History of pneumonia in 1993.  4. Hypertension.  5. Hypercholesterolemia.  6. Hiatal hernia.  7. Gastroesophageal reflux disease.  8. Diverticulitis (no recent flares).  9. Hemorrhoids.  10.      History of elevated liver function tests (mild fatty liver).  11.      History of urinary tract infections.  12.      History of cystitis.  13.      History of renal calculi.  14.      Postmenopausal.  15.      Fibrocystic breast disease.  16.      History of melanoma.   PLAN:  The patient will be admitted to [facility] to undergo a  right total knee arthroplasty per Dr. Lequita Halt.  The patient's medical  physician is  Dr. Jacky Kindle.  He will be notified of the room number on  admission and will be consulted if needed for any medical assistance with  this patient throughout the hospital  course.    Alexzandrew L. Julien Girt, P.A.              Ollen Gross, M.D.    ALP/MEDQ  D:  07/05/2003  T:  07/05/2003  Job:  [id number]   cc:   Geoffry Paradise, M.D.  [address]  [place]  [place] [address]  Fax: [phone]

## 2011-02-17 NOTE — Discharge Summary (Signed)
[name], [name] NO.:  [id number]   MEDICAL RECORD NO.:  [id number]          PATIENT TYPE:  INP   LOCATION:  3031                         FACILITY:  [facility]   PHYSICIAN:  Hewitt Shorts, M.D.DATE OF BIRTH:  Feb 14, 1935   DATE OF ADMISSION:  08/22/2007  DATE OF DISCHARGE:  08/28/2007                               DISCHARGE SUMMARY   The patient is a 76 year old woman who presented with low back and  bilateral lumbar radicular pain worse to the left lower extremity as  compared to the right lower extremity.  She had a left L4-5 synovial  cyst and an anterolisthesis at L4-5 with lateral recess stenosis.  The  patient is admitted for decompression and arthrodesis.   General examination was unremarkable.  Neurologic examination showed  good strength.  Further details of her admission history and physical  examination included in her admission note.   Hospital Course:  The patient is admitted and underwent a bilateral L4-5 lumbar  decompression and recession of the left L4-5 calcified synovial cyst  with decompression of the nerve roots and L4-5 posterior lumbar  interbody fusion, bilateral L4-L5 L4-L5 posterolateral arthrodesis.  Following surgery she did well initially, but gradually had some nerve  root irritation with cramps and discomfort in the lower extremities.  Progress as far as ambulation was gradual and physical therapy and  occupational therapy were consulted.  Physical medicine and  rehabilitation were consulted, but the patient made sufficient progress  such that she was able to be discharged to home.  Her wound was healing  well.  She is afebrile.  She is given instructions regarding wound care  and activities following discharge.  Dr.  Willaim Rayas was the discharging  physician and gave her prescriptions including Valium, Flexeril, and  Darvocet for muscle spasms and pain.  She is to return to the office in  about 3 weeks for follow up.  Her staples  were removed prior to  discharge.   DISCHARGE DIAGNOSES:  1. Degenerative spondylolisthesis.  2. Lumbar stenosis.  3. Lumbar synovial cyst.  4. Lumbar radiculopathy.      Hewitt Shorts, M.D.  Electronically Signed     RWN/MEDQ  D:  09/18/2007  T:  09/18/2007  Job:  [id number]   cc:   Hewitt Shorts, M.D.

## 2011-02-17 NOTE — Discharge Summary (Signed)
NAME:  [name], [name]                          ACCOUNT NO.:  [id number]   MEDICAL RECORD NO.:  [id number]                   PATIENT TYPE:  INP   LOCATION:  0467                                 FACILITY:  [facility]   PHYSICIAN:  Ollen Gross, M.D.                 DATE OF BIRTH:  1935/09/22   DATE OF ADMISSION:  07/06/2003  DATE OF DISCHARGE:  07/11/2003                                 DISCHARGE SUMMARY   ADMITTING DIAGNOSES:  1. Osteoarthritis right knee.  2. History of bronchitis in 2002.  3. History of pneumonia in 1993.  4. Hypertension.  5. Hypercholesterolemia.  6. Hiatal hernia.  7. Gastroesophageal reflux disease.  8. Diverticulitis (no recent flares).  9. Hemorrhoids.  10.      History of elevated liver function tests (mild fatty liver).  11.      History of urinary tract infection.  12.      History of cystitis.  13.      History of renal calculi.  14.      Postmenopausal.  15.      Fibrocystic breast disease.  16.      History of melanoma.   DISCHARGE DIAGNOSES:  1. Osteoarthritis right knee status post right total knee arthroplasty.  2. Postoperative hyponatremia, improving.  3. History of bronchitis in 2002.  4. History of pneumonia in 1993.  5. Hypertension.  6. Hypercholesterolemia.  7. Hiatal hernia.  8. Gastroesophageal reflux disease.  9. Diverticulitis (no recent flares).  10.      Hemorrhoids.  11.      History of elevated liver function tests (mild fatty liver).  12.      History of urinary tract infection.  13.      History of cystitis.  14.      History of renal calculi.  15.      Postmenopausal.  16.      Fibrocystic breast disease.  17.      History of melanoma.   PROCEDURE:  The patient was taken to the OR on July 06, 2003 and underwent  a right total knee arthroplasty.  Surgeon:  Dr. Homero Fellers Aluisio.  Assistant:  Avel Peace, P.A.-C.  Anesthesia:  Spinal.  Minimal blood loss.  Hemovac  drain x1.  Tourniquet time 42 minutes at 350 mmHg.   BRIEF HISTORY:  The patient is a 76 year old female well-known to Dr. Ollen Gross, has a known history of end-stage arthritis involving the right  knee, involves both the medial and patellofemoral components.  The pain has  been refractory to nonoperative management including injections.  It was  felt she would benefit from undergoing total knee.  Risks and benefits  discussed and the patient was subsequently admitted to the hospital.   CONSULTS:  Rehabilitation services.   LABORATORY DATA:  CBC on admission:  Hemoglobin 13.1, hematocrit 38.1, white  cell count 7.7, red cell  count 4.26, differential all within normal limits.  Postoperative H&H 11.9 and 34.0.  Last noted H&H 10.5 and 30.1.  PT/PTT on  admission were 13.6 and 36 respectively with an INR of 1.1.  Serial protimes  followed; last noted PT/INR 17.6 and 1.7.  Chem panel on admission all  within normal limits with the exception of elevated glucose at 146.  Serial  BMETs were followed; sodium did drop from 144 down to 129, was back up to  130.  Had a drop in chloride from 105 to 93, back up to 95.  Glucose  went  down from 146 down to 115.  Urinalysis on admission negative.  Blood  group/type B positive.   EKG dated July 02, 2003:  Normal sinus rhythm, cannot rule out anterior  infarct age undetermined; abnormal EKG/tracing rate is slower; confirmed by  Dr. Verdis Prime.  Chest x-ray dated July 02, 2003:  No active disease.   HOSPITAL COURSE:  The patient was admitted to [facility], taken to  the OR, underwent the above-stated procedure without complications.  The  patient tolerated the procedure well and later sent to the recovery room and  then to the orthopedic floor to continue postoperative care.  Vital signs  were followed.  The patient was given 24 hours of postoperative IV  antibiotics in the form of Ancef.  Placed on Coumadin for DVT prophylaxis.  Started on PCA and p.o. analgesia for pain control  following surgery.  Placed weightbearing as tolerated to the right lower extremity.  PT and OT  were consulted postoperatively.  Hemovac drained placed at the time of  surgery was pulled on postoperative day #1.  She was noted to have fluid  overload following surgery; KVO fluids, I&Os were followed.  She started to  diurese the fluid.  Also had a drop in sodium.  Serial BMETs were followed.  Sodium did drop down as low as 129 but was already coming back up to 130 and  improving.  Dressing change initiated on postoperative day #2.  Incision was  healing well.  She was encouraged to take p.o. medications.  She was weaned  off her PCA.  She did have a mild headache postoperative day #2 which was  felt to be due to the spinal.  Encourage p.o. fluid intake.  From a therapy  standpoint the patient initially slow to progress with physical therapy,  ambulating only approximately 25 feet by postoperative day #2 but then did  very well by postoperative day #3 of 100 feet and postoperative day #4 of  200 feet.  She had been seen in consultation by rehab services  postoperatively who felt that she may benefit from inpatient rehab stay  versus a short [facility] stay.  By postoperative day #4 her headache was  improving.  She did very well with physical therapy and felt at that point  since she progressed well did not need inpatient rehab.  Therefore,  discharge planning started to make arrangements for home health.  Once all  arrangements were made and by postoperative day #5 she was doing much  better, tolerating p.o. medications, progressing well with physical therapy,  and was discharged home.   DISCHARGE MEDICATIONS AND PLAN:  1. The patient was discharged home on July 11, 2003.  2. Discharge diagnoses:  Please see above.  3. Discharge medications:  Coumadin, Percocet, Valium.  4. Diet:  As tolerated.  5. Follow-up:  Two weeks. 6. Activity:  Home health PT  and home health nursing through [facility].  She is weightbearing as tolerated.  7. Diet:  Low sodium/low cholesterol diet.    DISPOSITION:  Home.   CONDITION UPON DISCHARGE:  Improving.     Alexzandrew L. Julien Girt, P.A.              Ollen Gross, M.D.    ALP/MEDQ  D:  08/12/2003  T:  08/12/2003  Job:  [id number]   cc:   Geoffry Paradise, M.D.  [address]  [place]  [place] [address]  Fax: [phone]

## 2011-02-17 NOTE — Op Note (Signed)
NAME:  [name], [name]                          ACCOUNT NO.:  [id number]   MEDICAL RECORD NO.:  [id number]                   PATIENT TYPE:  INP   LOCATION:  0007                                 FACILITY:  [facility]   PHYSICIAN:  Ollen Gross, M.D.                 DATE OF BIRTH:  September 12, 1935   DATE OF PROCEDURE:  07/06/2003  DATE OF DISCHARGE:                                 OPERATIVE REPORT   PREOPERATIVE DIAGNOSIS:  Osteoarthritis, right knee.   POSTOPERATIVE DIAGNOSIS:  Osteoarthritis, right knee.   PROCEDURE:  Right total knee arthroplasty.   SURGEON:  Gus Rankin. Aluisio, M.D.   ASSISTANT:  Alexzandrew L. Julien Girt, P.A.   ANESTHESIA:  Spinal.   ESTIMATED BLOOD LOSS:  Minimal.   DRAIN:  Hemovac x 1.   TOURNIQUET TIME:  42 minutes at 350 mmHg.   COMPLICATIONS:  None.   CONDITION:  Stable to recovery.   BRIEF CLINICAL NOTE:  [name] is a 76 year old female with end-stage  osteoarthritis of the right knee, especially involving the medial and  patellofemoral compartments.  She has had pain refractory to nonoperative  management including injections and presents now for total knee  arthroplasty.   PROCEDURE IN DETAIL:  After the successful administration of spinal  anesthetic, a tourniquet is placed high on the right thigh and right lower  extremity prepped and draped in the usual sterile fashion.  Extremity is  wrapped in Esmarch, knee flexed, and tourniquet inflated to 350 mmHg.  Standard midline incision is made with a 10 blade through a very thick layer  of subcutaneous tissue to the level of the extensor mechanism, and a fresh  blade is used to make a medial parapatellar arthrotomy.  Soft tissue over  the proximal and medial tibia is subperiosteally elevated to the joint line  with a knife into the semimembranosus bursa with a curved osteotome.  Soft  tissue over the proximal and lateral tibia is also elevated with attention  being paid to avoiding the patellar tendon  on the tibial tubercle.  The  patella is everted, knee flexed, and ACL and PCL removed.  The drill is used  to create a starting hole in the distal femur; canal is irrigated, and a  five degree right valgus alignment guide is placed.  Referencing off the  posterior condyles, rotation is marked and a block pinned to remove 10 mm  off the distal femur.  Distal femoral resection is made with an oscillating  saw.  Sizing block is placed; size 3 is the most appropriate.  Rotation  corresponds at the epicondylar axis.  The AP cutting block is placed and  anterior and posterior cuts made.   Tibia is subluxed forward and the menisci are removed.  Extramedullary  tibial alignment guide is placed referencing proximally at the medial aspect  of the tibial tubercle and distally along the second metatarsal axis and  tibial crest.  The block is pinned to remove 10 mm off the nondeficient  lateral side.  Tibia resection is made with an oscillating saw.  Sizing  block is placed.  Size 3 is the most appropriate, and then the proximal  tibia is prepared with the modular drill and keel punch.   The femoral preparation is completed with the intercondylar and chamfer  cuts.   Size 3 posterior stabilized femoral trial and size 3 mobile bearing trial  and a 10 mm posterior stabilized rotating platform insert trial placed.  With the 10, she had some hyperextension.  With the 12.5, she had full  extension and excellent varus and valgus balance throughout full range of  motion.  The patella was then everted, thickness measured to be 22 mm, free  hand resection taken to 13 mm.  The 35 template is placed, lug holes  drilled, trial patella placed, and it tracks normally.  Osteophytes are then  removed off the posterior femur with the trials in place.  All trials are  removed and the cut bone surfaces prepared with pulsatile lavage.  Cement is  mixed and once ready for implantation, a size 3 mobile bearing tibial  tray,  size 3 posterior stabilized femur, and 35 patella are cemented into place  and patella is held with the clamp.  Trial 12.5 mm insert is placed, knee  held in full extension, and all extruded cement removed.  Once the cement is  fully hardened, then the permanent 12.5 mm posterior stabilized rotating  platform insert is placed.  The wound is copiously irrigated with antibiotic  solution and then the extensor mechanism closed over a Hemovac drain with  interrupted #1 PDS.  Tourniquet is released for a total time of 42 minutes.  Minor bleeding stopped with cautery.  Flexion against gravity is 115  degrees, at which point her calf is touching her posterior thigh.  The subcu  tissue is closed with interrupted 2-0 Vicryl, subcuticular with running 4-0  Monocryl.  Hemovac is hooked to suction and Steri-Strips and a bulky sterile  dressing applied.  She is placed into a knee immobilizer, awakened, and  transported to recovery in stable condition.                                               Ollen Gross, M.D.    FA/MEDQ  D:  07/06/2003  T:  07/06/2003  Job:  [id number]

## 2011-06-21 LAB — COMPREHENSIVE METABOLIC PANEL
AST: 26
BUN: 19
CO2: 26
Chloride: 105
Creatinine, Ser: 1.02
GFR calc Af Amer: >60
GFR calc non Af Amer: 54 — ABNORMAL LOW
Glucose, Bld: 99
Total Bilirubin: 0.8

## 2011-06-21 LAB — DIFFERENTIAL
Basophils Absolute: 0
Eosinophils Absolute: 0.3
Eosinophils Relative: 4
Lymphocytes Relative: 42
Neutrophils Relative %: 41 — ABNORMAL LOW

## 2011-06-21 LAB — URINALYSIS, ROUTINE W REFLEX MICROSCOPIC
Bilirubin Urine: NEGATIVE
Protein, ur: NEGATIVE
Urobilinogen, UA: 0.2

## 2011-06-21 LAB — CBC
HCT: 39.2
Hemoglobin: 13.7
MCHC: 34.9
MCV: 88
RBC: 4.45
WBC: 7.1

## 2011-06-21 LAB — PROTIME-INR
INR: 1
Prothrombin Time: 13.6

## 2011-07-11 LAB — BASIC METABOLIC PANEL
BUN: 21
BUN: 17
CO2: 22
CO2: 30
CO2: 30
Calcium: 8.6
Calcium: 10.9 — ABNORMAL HIGH
Chloride: 101
Chloride: 96
Chloride: 101
Creatinine, Ser: 1.13
Creatinine, Ser: 1
GFR calc Af Amer: 58 — ABNORMAL LOW
GFR calc Af Amer: >60
GFR calc Af Amer: >60
GFR calc non Af Amer: 48 — ABNORMAL LOW
GFR calc non Af Amer: 55 — ABNORMAL LOW
Glucose, Bld: 167 — ABNORMAL HIGH
Glucose, Bld: 118 — ABNORMAL HIGH
Potassium: 4.2
Potassium: 4.6
Potassium: 4.2
Sodium: 133 — ABNORMAL LOW
Sodium: 134 — ABNORMAL LOW
Sodium: 138

## 2011-07-11 LAB — ABO/RH: ABO/RH(D): B POS

## 2011-07-11 LAB — TYPE AND SCREEN
ABO/RH(D): B POS
Antibody Screen: NEGATIVE

## 2011-07-11 LAB — CBC
HCT: 42.7
Hemoglobin: 11.3 — ABNORMAL LOW
Hemoglobin: 14.8
MCHC: 34.4
MCHC: 34.6
MCV: 91.7
MCV: 90.4
Platelets: 423 — ABNORMAL HIGH
RBC: 3.57 — ABNORMAL LOW
RBC: 4.72
RDW: 12.6
WBC: 8.9

## 2011-07-11 LAB — CALCIUM: Calcium: 10.1

## 2011-09-11 DIAGNOSIS — H401190 Primary open-angle glaucoma, unspecified eye, stage unspecified: Secondary | ICD-10-CM | POA: Insufficient documentation

## 2013-10-23 ENCOUNTER — Encounter: Payer: Self-pay | Admitting: Gastroenterology

## 2013-10-31 ENCOUNTER — Encounter: Payer: Self-pay | Admitting: *Deleted

## 2013-11-11 ENCOUNTER — Encounter: Payer: Self-pay | Admitting: Gastroenterology

## 2013-11-11 ENCOUNTER — Ambulatory Visit (INDEPENDENT_AMBULATORY_CARE_PROVIDER_SITE_OTHER): Payer: Medicare PPO | Admitting: Gastroenterology

## 2013-11-11 VITALS — BP 110/70 | HR 80 | Ht 62.5 in | Wt 258.2 lb

## 2013-11-11 DIAGNOSIS — K5730 Diverticulosis of large intestine without perforation or abscess without bleeding: Secondary | ICD-10-CM

## 2013-11-11 DIAGNOSIS — R05 Cough: Secondary | ICD-10-CM

## 2013-11-11 DIAGNOSIS — K219 Gastro-esophageal reflux disease without esophagitis: Secondary | ICD-10-CM

## 2013-11-11 DIAGNOSIS — D649 Anemia, unspecified: Secondary | ICD-10-CM

## 2013-11-11 DIAGNOSIS — R059 Cough, unspecified: Secondary | ICD-10-CM

## 2013-11-11 MED ORDER — MOVIPREP 100 G PO SOLR: 1.0000 | Freq: Once | ORAL | Status: DC

## 2013-11-11 NOTE — Progress Notes (Signed)
History of Present Illness:  This is a 79 year old Caucasian female who has no GI complaints.  She recently had physical exam in January with Dr. Reynaldo Minium and was found to have a hemoglobin 11.3 with microcytic indices.  Her last colonoscopy was 8 years ago and was unremarkable.  Hemoccult cards were obtained and were  negative.  She denies lower GI complaints having regular bowel movements without melena or hematochezia.  She does have a chronic nonproductive cough for several months with some associated hoarseness but no typical regurgitation or burning substernal chest pain.  Endoscopy in the past has shown a hiatal hernia and acid reflux.  She been on Nexium 40 mg a day for several years.  If she does not take Nexium, she does have typical acid reflux symptoms.  She has a long involved past medical history of fibromyalgia, has had previous cholecystectomy, has a history of hyperlipidemia and hypertension, previous surgery for hyperparathyroidism, surgery for melanoma, and multiple orthopedic and back surgeries.  She also has chronic anxiety and depression, previous history of kidney stones, and previous appendectomy.  She denies any dysphagia for solids or liquids, hepatobiliary, or lower gastrointestinal symptoms.  She also denies abuse of alcohol, cigarettes, but does take frequent ibuprofen 200 mg every 6 hours as needed for arthritic pain.  She additionally suffers from obesity.  I have reviewed this patient's present history, medical and surgical past history, allergies and medications.     ROS:   All systems were reviewed and are negative unless otherwise stated in the HPI.  Allergies  Allergen Reactions  . Iohexol      Code: HIVES    Outpatient Prescriptions Prior to Visit  Medication Sig Dispense Refill  . esomeprazole (NEXIUM) 40 MG capsule Take 40 mg by mouth daily at 12 noon.      Marland Kitchen losartan (COZAAR) 50 MG tablet Take 50 mg by mouth daily.       No facility-administered medications  prior to visit.   Past Medical History  Diagnosis Date  . Diverticulosis of colon (without mention of hemorrhage) 07/17/2007  . Gastritis 07/17/2007  . GERD (gastroesophageal reflux disease)   . Hyperlipemia   . Osteoarthritis   . Renal calculus, right     severe  . History of fatty infiltration of liver   . Glaucoma   . Hypertension    Past Surgical History  Procedure Laterality Date  . Colonoscopy  2008  . Esophagogastroduodenoscopy  2008  . Lumbar fusion  2008  . Knee arthroplasty  2004    RIGHT-TOTAL   . Cholecystectomy  2000  . Rotator cuff repair  2000    RIGHT  . Breast biopsy  1988    LEFT--BENIGN   . Cesarean section  1974  . Appendectomy  1974  . Tubal ligation  1974  . Cesarean section  1971  . Parathyroidectomy  2009    X 2--RIGHT INFERIOR AND LEFT SUPERIOR   History   Social History  . Marital Status: Married    Spouse Name: N/A    Number of Children: 2  . Years of Education: N/A   Occupational History  . Medical tech    Social History Main Topics  . Smoking status: Former Research scientist (life sciences)  . Smokeless tobacco: Never Used  . Alcohol Use: Yes  . Drug Use: No  . Sexual Activity: None   Other Topics Concern  . None   Social History Narrative  . None   Family History  Problem Relation Age  of Onset  . Colon polyps Mother        Physical Exam: Blood pressure 110/70, pulse 80 and regular, and weight 258 with a BMI of 46.44.  Emanation oral pharyngeal exam is unremarkable and his ASA class I.  I cannot appreciate thyromegaly or any neck masses. General well developed well nourished patient in no acute distress, appearing their stated age Eyes PERRLA, no icterus, fundoscopic exam per opthamologist Skin no lesions noted Neck supple, no adenopathy, no thyroid enlargement, no tenderness Chest clear to percussion and auscultation Heart no significant murmurs, gallops or rubs noted. Abdomen no hepatosplenomegaly masses or tenderness, BS  normal  Extremities no acute joint lesions, edema, phlebitis or evidence of cellulitis.  Trace edema noted no cellulitis or phlebitis. Neurologic patient oriented x 3, cranial nerves intact, no focal neurologic deficits noted. Psychological mental status normal and normal affect.  Assessment and plan: New-onset iron deficiency anemia of unexplained etiology.  I think her chronic cough is related to uncontrolled acid reflux, and I've schedule her for followup endoscopic exam and have changed her to Dexilant 60 mg a day for longer acting acid suppression.  She also is due for followup colonoscopy which is been scheduled at her convenience with nurse anesthesia attendance.  Lab  Check today for an anemia profile for iron levels.  At time of endoscopy she should also have small bowel biopsy to exclude celiac disease.  Standard antireflux maneuvers reviewed with patient.  She is otherwise continue medications as listed and reviewed per primary care.  CC Dr. Burnard Bunting at [facility]

## 2013-11-11 NOTE — Patient Instructions (Signed)

## 2013-11-19 ENCOUNTER — Encounter: Payer: Medicare PPO | Admitting: Internal Medicine

## 2013-12-09 ENCOUNTER — Ambulatory Visit: Payer: Medicare PPO | Admitting: Internal Medicine

## 2014-03-16 ENCOUNTER — Encounter: Payer: Self-pay | Admitting: *Deleted

## 2014-03-17 ENCOUNTER — Ambulatory Visit (INDEPENDENT_AMBULATORY_CARE_PROVIDER_SITE_OTHER): Payer: Medicare PPO | Admitting: Neurology

## 2014-03-17 ENCOUNTER — Encounter: Payer: Self-pay | Admitting: Neurology

## 2014-03-17 ENCOUNTER — Encounter (INDEPENDENT_AMBULATORY_CARE_PROVIDER_SITE_OTHER): Payer: Self-pay

## 2014-03-17 VITALS — BP 125/79 | HR 73 | Ht 62.75 in | Wt 249.0 lb

## 2014-03-17 DIAGNOSIS — G609 Hereditary and idiopathic neuropathy, unspecified: Secondary | ICD-10-CM

## 2014-03-17 DIAGNOSIS — M5416 Radiculopathy, lumbar region: Secondary | ICD-10-CM

## 2014-03-17 DIAGNOSIS — IMO0002 Reserved for concepts with insufficient information to code with codable children: Secondary | ICD-10-CM

## 2014-03-17 DIAGNOSIS — IMO0001 Reserved for inherently not codable concepts without codable children: Secondary | ICD-10-CM

## 2014-03-17 DIAGNOSIS — M791 Myalgia, unspecified site: Secondary | ICD-10-CM

## 2014-03-17 NOTE — Patient Instructions (Addendum)
Overall you are doing fairly well but I do want to suggest a few things today:   Remember to drink plenty of fluid, eat healthy meals and do not skip any meals. Try to eat protein with a every meal and eat a healthy snack such as fruit or nuts in between meals. Try to keep a regular sleep-wake schedule and try to exercise daily, particularly in the form of walking, 20-30 minutes a day, if you can.   As far as diagnostic testing:  1)Please have some blood work completed today 2)I would like you to have an EMG/Nerve conduction study. Please schedule this when you check out  Follow up as needed. Please call us with any interim questions, concerns, problems, updates or refill requests.   My clinical assistant and will answer any of your questions and relay your messages to me and also relay most of my messages to you.   Our phone number is [phone]. We also have an after hours call service for urgent matters and there is a physician on-call for urgent questions. For any emergencies you know to call 911 or go to the nearest emergency room

## 2014-03-17 NOTE — Progress Notes (Signed)
[facility]    Marney Treloar:  Dr Janann Colonel Referring Jaskaran Dauzat: Geoffery Lyons, MD Primary Care Physician:  Geoffery Lyons, MD  CC:  dizziness  HPI:  [name] is a 79 y.o. female here as a referral from Dr. Reynaldo Minium for dizziness. Notes symptoms started around 1 year ago, comes and goes. Happens more when standing or changing position. Notes feeling like she is going to pass out. Also, feels unsteady when she walks. Has had 3 falls in the past 2 years. Hydrates well. No palpitations. No change in vision, no graying of her vision. Has recently started  using a cane. Has more difficulty walking at night when it is dark.   Has lumbar back surgery x 2 (2011, 2013), related to herniated disc and stenosis. Has had numbness and burning sensation radiating from lumbar spine down the lateral portion of her leg to the toes. Will get occasional stabbing pains.   Patient denies DM, CVA, TIAs. Has HTN, well controlled. Recently diagnosed with anemia, (reports Hb went from 14 down to 9). They suspect it is iron deficiency but workup is ongoing. Currently taking iron supplement. Scheduled for a colonoscopy.   Review of Systems: Out of a complete 14 system review, the patient complains of only the following symptoms, and all other reviewed systems are negative. + fatigue, anemia, numbness, weakness, restless legs, cough, feeling cold, joint pain, decreased energy  History   Social History  . Marital Status: Married    Spouse Name: N/A    Number of Children: 2  . Years of Education: N/A   Occupational History  . Medical tech    Social History Main Topics  . Smoking status: Former Research scientist (life sciences)  . Smokeless tobacco: Never Used  . Alcohol Use: Yes  . Drug Use: No  . Sexual Activity: Not on file   Other Topics Concern  . Not on file   Social History Narrative   Married, with 2 children   Right handed   Some college   No caffeine          Family History  Problem  Relation Age of Onset  . Colon polyps Mother   . Hypertension Mother   . Osteoarthritis Mother   . Diabetes Father   . Stroke Father     Past Medical History  Diagnosis Date  . Diverticulosis of colon (without mention of hemorrhage) 07/17/2007  . Gastritis 07/17/2007  . GERD (gastroesophageal reflux disease)   . Hyperlipemia   . Osteoarthritis   . Renal calculus, right     severe  . History of fatty infiltration of liver   . Glaucoma   . Hypertension   . Hyperparathyroidism     Past Surgical History  Procedure Laterality Date  . Colonoscopy  2008  . Esophagogastroduodenoscopy  2008  . Lumbar fusion  2008  . Knee arthroplasty  2004    RIGHT-TOTAL   . Cholecystectomy  2000  . Rotator cuff repair  2000    RIGHT  . Breast biopsy  1988    LEFT--BENIGN   . Cesarean section  1974  . Appendectomy  1974  . Tubal ligation  1974  . Cesarean section  1971  . Parathyroidectomy  2009    X 2--RIGHT INFERIOR AND LEFT SUPERIOR    Current Outpatient Prescriptions  Medication Sig Dispense Refill  . docusate sodium (STOOL SOFTENER) 100 MG capsule Take 100 mg by mouth daily as needed for mild constipation.      . dorzolamide-timolol (  COSOPT) 22.3-6.8 MG/ML ophthalmic solution       . DULoxetine (CYMBALTA) 30 MG capsule       . esomeprazole (NEXIUM) 40 MG capsule Take 40 mg by mouth daily at 12 noon.      Marland Kitchen ibuprofen (ADVIL,MOTRIN) 200 MG tablet Take 200 mg by mouth every 6 (six) hours as needed.      Marland Kitchen losartan (COZAAR) 50 MG tablet Take 50 mg by mouth daily.      Marland Kitchen LUMIGAN 0.01 % SOLN       . POLY-IRON 150 150 MG capsule       . triamcinolone cream (KENALOG) 0.1 %       . triamterene-hydrochlorothiazide (MAXZIDE-25) 37.5-25 MG per tablet       . MOVIPREP 100 G SOLR Take 1 kit (200 g total) by mouth once.  1 kit  0   No current facility-administered medications for this visit.    Allergies as of 03/17/2014 - Review Complete 03/17/2014  Allergen Reaction Noted  . Codeine  Nausea And Vomiting 03/17/2014  . Iohexol  02/21/2007    Vitals: BP 125/79  Pulse 73  Ht 5' 2.75" (1.594 m)  Wt 249 lb (112.946 kg)  BMI 44.45 kg/m2 Last Weight:  Wt Readings from Last 1 Encounters:  03/17/14 249 lb (112.946 kg)   Last Height:   Ht Readings from Last 1 Encounters:  03/17/14 5' 2.75" (1.594 m)     Physical exam: Exam: Gen: NAD, conversant Eyes: anicteric sclerae, moist conjunctivae HENT: Atraumatic, oropharynx clear Neck: Trachea midline; supple,  Lungs: CTA, no wheezing, rales, rhonic                          CV: RRR, no MRG Abdomen: Soft, non-tender;  Extremities: non-pitting edematous LE Skin: Normal temperature, no rash,  Psych: Appropriate affect, pleasant  Neuro: MS: AA&Ox3, appropriately interactive, normal affect   Speech: fluent w/o paraphasic error  Memory: good recent and remote recall  CN: PERRL, EOMI no nystagmus, no ptosis, sensation intact to LT V1-V3 bilat, face symmetric, no weakness, hearing grossly intact, palate elevates symmetrically, shoulder shrug 5/5 bilat,  tongue protrudes midline, no fasiculations noted.  Motor: normal bulk and tone Strength: 5/5  In all extremities  Reflexes: decreased patellar bilat, absent AJ bilat, symmetrical, bilat downgoing toes  Sens: decreased PP, temp, vibration, LT proprioception bilateral LE  Gait: slow, slightly wide based, antalgic favoring right leg, unable to tandem, +Romberg   Assessment:  After physical and neurologic examination, review of laboratory studies, imaging, neurophysiology testing and pre-existing records, assessment will be reviewed on the problem list.  Plan:  Treatment plan and additional workup will be reviewed under Problem List.  1)Gait instability 2)Dizziness 3)Fatigue 4)Anemia  78y/o woman presenting for initial evaluation of gait instability and dizziness. Suspect her symptoms are likely multifactorial and related to her anemia, underlying peripheral  neuropathy, her history of knee surgery and a suspected lumbar radiculopathy. Unclear etiology of anemia, currently undergoing workup. Will check B12, MMA, TSH, HbA1c, SPEP for neuropathy workup. Will check EMG/NCS. Discussed PT referral but patient wishes to hold off at this time. Follow up once workup completed.   Jim Like, DO  [facility] [address] [place], [place] [address]  Phone [phone] Fax [phone]

## 2014-03-19 LAB — METHYLMALONIC ACID, SERUM: METHYLMALONIC ACID: 377 nmol/L (ref 0–378)

## 2014-03-19 LAB — PROTEIN ELECTROPHORESIS
A/G Ratio: 1.1 (ref 0.7–2.0)
ALPHA 1: 0.3 g/dL (ref 0.1–0.4)
ALPHA 2: 1 g/dL (ref 0.4–1.2)
Albumin ELP: 3.4 g/dL (ref 3.2–5.6)
Beta: 1.1 g/dL (ref 0.6–1.3)
GLOBULIN, TOTAL: 3.1 g/dL (ref 2.0–4.5)
Gamma Globulin: 0.8 g/dL (ref 0.5–1.6)
TOTAL PROTEIN: 6.5 g/dL (ref 6.0–8.5)

## 2014-03-19 LAB — HGB A1C W/O EAG: Hgb A1c MFr Bld: 6.8 % — ABNORMAL HIGH (ref 4.8–5.6)

## 2014-03-19 LAB — TSH: TSH: 2.68 u[IU]/mL (ref 0.450–4.500)

## 2014-03-19 LAB — VITAMIN B12: VITAMIN B 12: 567 pg/mL (ref 211–946)

## 2014-03-19 LAB — CK: CK TOTAL: 119 U/L (ref 24–173)

## 2014-03-19 NOTE — Progress Notes (Signed)
Quick Note:  Spoke with patient and informed her of elevated blood work, instructed patient to follow up with her PCP for further evaluation and to call back with any questions or concerns. ______

## 2014-04-13 ENCOUNTER — Ambulatory Visit (INDEPENDENT_AMBULATORY_CARE_PROVIDER_SITE_OTHER): Payer: Self-pay | Admitting: Neurology

## 2014-04-13 ENCOUNTER — Ambulatory Visit (INDEPENDENT_AMBULATORY_CARE_PROVIDER_SITE_OTHER): Payer: Medicare PPO | Admitting: Radiology

## 2014-04-13 ENCOUNTER — Encounter: Payer: Self-pay | Admitting: Neurology

## 2014-04-13 DIAGNOSIS — IMO0002 Reserved for concepts with insufficient information to code with codable children: Secondary | ICD-10-CM

## 2014-04-13 DIAGNOSIS — G544 Lumbosacral root disorders, not elsewhere classified: Secondary | ICD-10-CM

## 2014-04-13 DIAGNOSIS — M5416 Radiculopathy, lumbar region: Secondary | ICD-10-CM

## 2014-04-13 DIAGNOSIS — G609 Hereditary and idiopathic neuropathy, unspecified: Secondary | ICD-10-CM

## 2014-04-13 HISTORY — DX: Lumbosacral root disorders, not elsewhere classified: G54.4

## 2014-04-13 NOTE — Procedures (Signed)
HISTORY:  [name] is a 79 year old patient with a prior lumbosacral spine procedure at the L2-3 and L4-5 levels 2 years ago. Within the last 8 months, she has noted some numbness that includes the feet bilaterally, with some low back pain and discomfort mainly going down the left leg. The patient is being evaluated for a possible peripheral neuropathy or a lumbosacral radiculopathy.  NERVE CONDUCTION STUDIES:  Nerve conduction studies were performed on the left upper extremity. The distal motor latencies and motor amplitudes for the median and ulnar nerves were within normal limits. The F wave latencies and nerve conduction velocities for these nerves were also normal. The sensory latencies for the median and ulnar nerves were normal.  Nerve conduction studies were performed on both lower extremities. The distal motor latencies and motor amplitudes for the peroneal and posterior tibial nerves were within normal limits. The nerve conduction velocities for these nerves were also normal. The F wave latencies were normal for the peroneal and posterior tibial nerves bilaterally. The sensory latencies for the peroneal nerves were within normal limits.   EMG STUDIES:  EMG study was performed on the let lower extremity:  The tibialis anterior muscle reveals 2 to 5K motor units with decreased recruitment. 2+ fibrillations and positive waves were seen. The peroneus tertius muscle reveals 1 to 3K motor units with moderately decreased recruitment. 2+ fibrillations and positive waves were seen. The medial gastrocnemius muscle reveals 2 to 4K motor units with full recruitment. No fibrillations or positive waves were seen. The vastus lateralis muscle reveals 2 to 4K motor units with full recruitment. No fibrillations or positive waves were seen. The iliopsoas muscle reveals 2 to 4K motor units with full recruitment. No fibrillations or positive waves were seen. The biceps femoris muscle (long head)  reveals 2 to 4K motor units with mildly decreased recruitment. 2+ fibrillations and positive waves were seen. The lumbosacral paraspinal muscles were tested at 3 levels, and revealed 2+ fibrillations and positive waves levels tested. There was good relaxation.  EMG study was performed on the right lower extremity:  The tibialis anterior muscle reveals 2 to 4K motor units with full recruitment. No fibrillations or positive waves were seen. The peroneus tertius muscle reveals 2 to 5K motor units with decreased recruitment. No fibrillations or positive waves were seen. The medial gastrocnemius muscle reveals up to 6K motor units with decreased recruitment. 2+ fibrillations and positive waves were seen. The vastus lateralis muscle reveals 2 to 4K motor units with full recruitment. No fibrillations or positive waves were seen. The iliopsoas muscle reveals 2 to 4K motor units with full recruitment. No fibrillations or positive waves were seen. The biceps femoris muscle (long head) reveals 2 to 4K motor units with full recruitment. No fibrillations or positive waves were seen. The lumbosacral paraspinal muscles were tested at 3 levels, and revealed no abnormalities of insertional activity at the upper level tested. One plus fibrillations and positive waves were seen at the middle level, 2+ fibrillations and positive waves were seen at the lower level. There was good relaxation.   IMPRESSION:  Nerve conduction studies done on the left upper extremity and on both lower extremities were unremarkable. There is no evidence of a peripheral neuropathy. EMG evaluation of the left lower extremity shows findings most consistent with an acute L5 radiculopathy. EMG evaluation of the right lower extremity shows findings most consistent with an acute and chronic S1 radiculopathy. No other significant abnormalities were seen.  Bonner Puna  Jannifer Franklin MD 04/13/2014 2:06 PM  [facility] [address]  [place], [place] [address]  Phone [phone] Fax [phone]

## 2014-04-14 ENCOUNTER — Other Ambulatory Visit: Payer: Self-pay | Admitting: Neurology

## 2014-04-14 DIAGNOSIS — M5416 Radiculopathy, lumbar region: Secondary | ICD-10-CM

## 2014-04-16 ENCOUNTER — Telehealth: Payer: Self-pay | Admitting: *Deleted

## 2014-04-16 NOTE — Telephone Encounter (Signed)
Message copied by Vivi Barrack on Thu Apr 16, 2014  3:46 PM ------      Message from: Drema Dallas      Created: Tue Apr 14, 2014  4:47 PM       Please let her know her recent EMG/NCS shows some possible nerve involvement and I would like to get a MRI of the lumbar spine to evaluate further. Thanks. ------

## 2014-04-16 NOTE — Telephone Encounter (Signed)
Spoke to patient and she is aware of the EMG/NCS results. Patient verbalizes understating that she will be getting a call regurading having a MRI to evaluate her further.

## 2014-04-17 ENCOUNTER — Encounter (HOSPITAL_COMMUNITY): Payer: Self-pay

## 2014-04-17 ENCOUNTER — Ambulatory Visit (HOSPITAL_COMMUNITY)
Admission: RE | Admit: 2014-04-17 | Discharge: 2014-04-17 | Disposition: A | Discharge status: Home / Self Care | Payer: Medicare PPO | Source: Ambulatory Visit | Attending: Internal Medicine | Admitting: Internal Medicine

## 2014-04-17 DIAGNOSIS — M810 Age-related osteoporosis without current pathological fracture: Secondary | ICD-10-CM | POA: Insufficient documentation

## 2014-04-17 MED ORDER — ZOLEDRONIC ACID 5 MG/100ML IV SOLN
5.0000 mg | Freq: Once | INTRAVENOUS | Status: AC
  Administered 2014-04-17: 5 mg via INTRAVENOUS
  Filled 2014-04-17: qty 100

## 2014-04-17 MED ORDER — SODIUM CHLORIDE 0.9 % IV SOLN
Freq: Once | INTRAVENOUS | Status: AC
  Administered 2014-04-17: 11:00:00 via INTRAVENOUS

## 2014-04-17 NOTE — Progress Notes (Signed)
Pt received first Reclast infusion today.  Pt tolerated well, no adverse reactions following infusion.  Pt was given handout on Reclast and informed that she may experience some body aches after receiving med and recommended OTC motrin or tylenol for that. Pt voiced understanding.

## 2014-04-17 NOTE — Discharge Instructions (Signed)

## 2014-05-02 ENCOUNTER — Ambulatory Visit
Admission: RE | Admit: 2014-05-02 | Discharge: 2014-05-02 | Disposition: A | Discharge status: Home / Self Care | Payer: Medicare PPO | Source: Ambulatory Visit | Attending: Neurology | Admitting: Neurology

## 2014-05-02 DIAGNOSIS — M549 Dorsalgia, unspecified: Secondary | ICD-10-CM

## 2014-05-02 DIAGNOSIS — M5416 Radiculopathy, lumbar region: Secondary | ICD-10-CM

## 2014-06-24 ENCOUNTER — Telehealth: Payer: Self-pay | Admitting: Neurology

## 2014-06-24 NOTE — Telephone Encounter (Signed)
Patient calling regarding MRI results.  Please call anytime and may leave detailed message on voicemail.

## 2014-06-24 NOTE — Telephone Encounter (Signed)
See phone note 9/23.

## 2014-06-25 NOTE — Telephone Encounter (Signed)
Returned patients call and discussed MRI results. Based on MRI and EMG/NCS results she will follow up with her back surgeon for further evaluation.

## 2014-11-06 DIAGNOSIS — H4011X2 Primary open-angle glaucoma, moderate stage: Secondary | ICD-10-CM | POA: Diagnosis not present

## 2014-11-06 DIAGNOSIS — H2513 Age-related nuclear cataract, bilateral: Secondary | ICD-10-CM | POA: Diagnosis not present

## 2014-11-06 DIAGNOSIS — H35372 Puckering of macula, left eye: Secondary | ICD-10-CM | POA: Diagnosis not present

## 2014-11-06 DIAGNOSIS — H25013 Cortical age-related cataract, bilateral: Secondary | ICD-10-CM | POA: Diagnosis not present

## 2014-11-30 DIAGNOSIS — H25012 Cortical age-related cataract, left eye: Secondary | ICD-10-CM | POA: Diagnosis not present

## 2014-11-30 DIAGNOSIS — H5213 Myopia, bilateral: Secondary | ICD-10-CM | POA: Diagnosis not present

## 2014-11-30 DIAGNOSIS — H2511 Age-related nuclear cataract, right eye: Secondary | ICD-10-CM | POA: Diagnosis not present

## 2014-11-30 DIAGNOSIS — Z9889 Other specified postprocedural states: Secondary | ICD-10-CM | POA: Diagnosis not present

## 2014-11-30 DIAGNOSIS — H25011 Cortical age-related cataract, right eye: Secondary | ICD-10-CM | POA: Diagnosis not present

## 2014-11-30 DIAGNOSIS — H2512 Age-related nuclear cataract, left eye: Secondary | ICD-10-CM | POA: Diagnosis not present

## 2014-12-08 DIAGNOSIS — H2511 Age-related nuclear cataract, right eye: Secondary | ICD-10-CM | POA: Diagnosis not present

## 2014-12-09 DIAGNOSIS — H2512 Age-related nuclear cataract, left eye: Secondary | ICD-10-CM | POA: Diagnosis not present

## 2014-12-09 DIAGNOSIS — H25012 Cortical age-related cataract, left eye: Secondary | ICD-10-CM | POA: Diagnosis not present

## 2014-12-15 DIAGNOSIS — H2512 Age-related nuclear cataract, left eye: Secondary | ICD-10-CM | POA: Diagnosis not present

## 2015-02-01 DIAGNOSIS — N6001 Solitary cyst of right breast: Secondary | ICD-10-CM | POA: Diagnosis not present

## 2015-03-15 DIAGNOSIS — H4011X2 Primary open-angle glaucoma, moderate stage: Secondary | ICD-10-CM | POA: Diagnosis not present

## 2015-04-14 DIAGNOSIS — Z79899 Other long term (current) drug therapy: Secondary | ICD-10-CM | POA: Diagnosis not present

## 2015-04-14 DIAGNOSIS — Z6841 Body Mass Index (BMI) 40.0 and over, adult: Secondary | ICD-10-CM | POA: Diagnosis not present

## 2015-04-14 DIAGNOSIS — M810 Age-related osteoporosis without current pathological fracture: Secondary | ICD-10-CM | POA: Diagnosis not present

## 2015-05-14 ENCOUNTER — Encounter (HOSPITAL_COMMUNITY): Payer: Self-pay

## 2015-05-14 ENCOUNTER — Ambulatory Visit (HOSPITAL_COMMUNITY)
Admission: RE | Admit: 2015-05-14 | Discharge: 2015-05-14 | Disposition: A | Discharge status: Home / Self Care | Payer: Medicare PPO | Source: Ambulatory Visit | Attending: Internal Medicine | Admitting: Internal Medicine

## 2015-05-14 DIAGNOSIS — M810 Age-related osteoporosis without current pathological fracture: Secondary | ICD-10-CM | POA: Diagnosis not present

## 2015-05-14 HISTORY — DX: Age-related osteoporosis without current pathological fracture: M81.0

## 2015-05-14 HISTORY — DX: Obesity, unspecified: E66.9

## 2015-05-14 MED ORDER — ZOLEDRONIC ACID 5 MG/100ML IV SOLN
5.0000 mg | Freq: Once | INTRAVENOUS | Status: AC
  Administered 2015-05-14: 5 mg via INTRAVENOUS
  Filled 2015-05-14: qty 100

## 2015-05-14 MED ORDER — SODIUM CHLORIDE 0.9 % IV SOLN
Freq: Once | INTRAVENOUS | Status: AC
  Administered 2015-05-14: 250 mL via INTRAVENOUS

## 2015-05-14 NOTE — Discharge Instructions (Signed)
Drink fluids/water as tolerated over next 72hrs °Tylenol or Ibuprofen OTC as directed °Continue calcium and Vit D as directed by your MDZoledronic Acid injection (Paget's Disease, Osteoporosis) °What is this medicine? °ZOLEDRONIC ACID (ZOE le dron ik AS id) lowers the amount of calcium loss from bone. It is used to treat Paget's disease and osteoporosis in women. °This medicine may be used for other purposes; ask your health care provider or pharmacist if you have questions. °COMMON BRAND NAME(S): Reclast, Zometa °What should I tell my health care provider before I take this medicine? °They need to know if you have any of these conditions: °-aspirin-sensitive asthma °-cancer, especially if you are receiving medicines used to treat cancer °-dental disease or wear dentures °-infection °-kidney disease °-low levels of calcium in the blood °-past surgery on the parathyroid gland or intestines °-receiving corticosteroids like dexamethasone or prednisone °-an unusual or allergic reaction to zoledronic acid, other medicines, foods, dyes, or preservatives °-pregnant or trying to get pregnant °-breast-feeding °How should I use this medicine? °This medicine is for infusion into a vein. It is given by a health care professional in a hospital or clinic setting. °Talk to your pediatrician regarding the use of this medicine in children. This medicine is not approved for use in children. °Overdosage: If you think you have taken too much of this medicine contact a poison control center or emergency room at once. °NOTE: This medicine is only for you. Do not share this medicine with others. °What if I miss a dose? °It is important not to miss your dose. Call your doctor or health care professional if you are unable to keep an appointment. °What may interact with this medicine? °-certain antibiotics given by injection °-NSAIDs, medicines for pain and inflammation, like ibuprofen or naproxen °-some diuretics like bumetanide,  furosemide °-teriparatide °This list may not describe all possible interactions. Give your health care provider a list of all the medicines, herbs, non-prescription drugs, or dietary supplements you use. Also tell them if you smoke, drink alcohol, or use illegal drugs. Some items may interact with your medicine. °What should I watch for while using this medicine? °Visit your doctor or health care professional for regular checkups. It may be some time before you see the benefit from this medicine. Do not stop taking your medicine unless your doctor tells you to. Your doctor may order blood tests or other tests to see how you are doing. °Women should inform their doctor if they wish to become pregnant or think they might be pregnant. There is a potential for serious side effects to an unborn child. Talk to your health care professional or pharmacist for more information. °You should make sure that you get enough calcium and vitamin D while you are taking this medicine. Discuss the foods you eat and the vitamins you take with your health care professional. °Some people who take this medicine have severe bone, joint, and/or muscle pain. This medicine may also increase your risk for jaw problems or a broken thigh bone. Tell your doctor right away if you have severe pain in your jaw, bones, joints, or muscles. Tell your doctor if you have any pain that does not go away or that gets worse. °Tell your dentist and dental surgeon that you are taking this medicine. You should not have major dental surgery while on this medicine. See your dentist to have a dental exam and fix any dental problems before starting this medicine. Take good care of your teeth while on   this medicine. Make sure you see your dentist for regular follow-up appointments. °What side effects may I notice from receiving this medicine? °Side effects that you should report to your doctor or health care professional as soon as possible: °-allergic reactions  like skin rash, itching or hives, swelling of the face, lips, or tongue °-anxiety, confusion, or depression °-breathing problems °-changes in vision °-eye pain °-feeling faint or lightheaded, falls °-jaw pain, especially after dental work °-mouth sores °-muscle cramps, stiffness, or weakness °-trouble passing urine or change in the amount of urine °Side effects that usually do not require medical attention (report to your doctor or health care professional if they continue or are bothersome): °-bone, joint, or muscle pain °-constipation °-diarrhea °-fever °-hair loss °-irritation at site where injected °-loss of appetite °-nausea, vomiting °-stomach upset °-trouble sleeping °-trouble swallowing °-weak or tired °This list may not describe all possible side effects. Call your doctor for medical advice about side effects. You may report side effects to FDA at 1-800-FDA-1088. °Where should I keep my medicine? °This drug is given in a hospital or clinic and will not be stored at home. °NOTE: This sheet is a summary. It may not cover all possible information. If you have questions about this medicine, talk to your doctor, pharmacist, or health care provider. °© 2015, [employer]. (2013-03-03 10:03:48) ° °

## 2015-06-14 DIAGNOSIS — E785 Hyperlipidemia, unspecified: Secondary | ICD-10-CM | POA: Diagnosis not present

## 2015-06-14 DIAGNOSIS — M810 Age-related osteoporosis without current pathological fracture: Secondary | ICD-10-CM | POA: Diagnosis not present

## 2015-06-14 DIAGNOSIS — I10 Essential (primary) hypertension: Secondary | ICD-10-CM | POA: Diagnosis not present

## 2015-06-16 DIAGNOSIS — H4011X2 Primary open-angle glaucoma, moderate stage: Secondary | ICD-10-CM | POA: Diagnosis not present

## 2015-06-16 DIAGNOSIS — H40053 Ocular hypertension, bilateral: Secondary | ICD-10-CM | POA: Diagnosis not present

## 2015-06-17 DIAGNOSIS — D539 Nutritional anemia, unspecified: Secondary | ICD-10-CM | POA: Diagnosis not present

## 2015-06-17 DIAGNOSIS — M810 Age-related osteoporosis without current pathological fracture: Secondary | ICD-10-CM | POA: Diagnosis not present

## 2015-06-17 DIAGNOSIS — M47817 Spondylosis without myelopathy or radiculopathy, lumbosacral region: Secondary | ICD-10-CM | POA: Diagnosis not present

## 2015-06-17 DIAGNOSIS — R05 Cough: Secondary | ICD-10-CM | POA: Diagnosis not present

## 2015-06-17 DIAGNOSIS — E1129 Type 2 diabetes mellitus with other diabetic kidney complication: Secondary | ICD-10-CM | POA: Diagnosis not present

## 2015-06-17 DIAGNOSIS — I129 Hypertensive chronic kidney disease with stage 1 through stage 4 chronic kidney disease, or unspecified chronic kidney disease: Secondary | ICD-10-CM | POA: Diagnosis not present

## 2015-06-17 DIAGNOSIS — N183 Chronic kidney disease, stage 3 (moderate): Secondary | ICD-10-CM | POA: Diagnosis not present

## 2015-06-17 DIAGNOSIS — Z0000 Encounter for general adult medical examination without abnormal findings: Secondary | ICD-10-CM | POA: Diagnosis not present

## 2015-06-21 DIAGNOSIS — Z1212 Encounter for screening for malignant neoplasm of rectum: Secondary | ICD-10-CM | POA: Diagnosis not present

## 2015-06-29 DIAGNOSIS — E8770 Fluid overload, unspecified: Secondary | ICD-10-CM | POA: Diagnosis not present

## 2015-06-29 DIAGNOSIS — I509 Heart failure, unspecified: Secondary | ICD-10-CM | POA: Diagnosis not present

## 2015-06-29 DIAGNOSIS — Z6841 Body Mass Index (BMI) 40.0 and over, adult: Secondary | ICD-10-CM | POA: Diagnosis not present

## 2015-06-29 DIAGNOSIS — R05 Cough: Secondary | ICD-10-CM | POA: Diagnosis not present

## 2015-06-29 DIAGNOSIS — I10 Essential (primary) hypertension: Secondary | ICD-10-CM | POA: Diagnosis not present

## 2015-07-01 DIAGNOSIS — I129 Hypertensive chronic kidney disease with stage 1 through stage 4 chronic kidney disease, or unspecified chronic kidney disease: Secondary | ICD-10-CM | POA: Diagnosis not present

## 2015-07-01 DIAGNOSIS — I509 Heart failure, unspecified: Secondary | ICD-10-CM | POA: Diagnosis not present

## 2015-07-01 DIAGNOSIS — D539 Nutritional anemia, unspecified: Secondary | ICD-10-CM | POA: Diagnosis not present

## 2015-07-01 DIAGNOSIS — M810 Age-related osteoporosis without current pathological fracture: Secondary | ICD-10-CM | POA: Diagnosis not present

## 2015-07-01 DIAGNOSIS — R05 Cough: Secondary | ICD-10-CM | POA: Diagnosis not present

## 2015-07-01 DIAGNOSIS — E1129 Type 2 diabetes mellitus with other diabetic kidney complication: Secondary | ICD-10-CM | POA: Diagnosis not present

## 2015-07-01 DIAGNOSIS — F329 Major depressive disorder, single episode, unspecified: Secondary | ICD-10-CM | POA: Diagnosis not present

## 2015-07-01 DIAGNOSIS — N183 Chronic kidney disease, stage 3 (moderate): Secondary | ICD-10-CM | POA: Diagnosis not present

## 2015-07-02 ENCOUNTER — Telehealth: Payer: Self-pay | Admitting: Cardiovascular Disease

## 2015-07-02 NOTE — Telephone Encounter (Signed)
07/02/2015 Received fax referral from [facility] for upcoming appointment with Dr. Oval Linsey on 07/21/2015 @ 3pm.  cbr

## 2015-07-08 ENCOUNTER — Telehealth: Payer: Self-pay | Admitting: Cardiovascular Disease

## 2015-07-08 NOTE — Telephone Encounter (Signed)
Received records from [facility] for appointment on 07/21/15 with Dr Oval Linsey.  Records given to [facility] (medical records) for Dr Blenda Mounts schedule on 07/21/15. lp

## 2015-07-21 ENCOUNTER — Ambulatory Visit (INDEPENDENT_AMBULATORY_CARE_PROVIDER_SITE_OTHER): Payer: Medicare PPO | Admitting: Cardiovascular Disease

## 2015-07-21 ENCOUNTER — Encounter: Payer: Self-pay | Admitting: Cardiovascular Disease

## 2015-07-21 VITALS — BP 120/70 | HR 71 | Ht 63.0 in | Wt 248.3 lb

## 2015-07-21 DIAGNOSIS — R011 Cardiac murmur, unspecified: Secondary | ICD-10-CM | POA: Diagnosis not present

## 2015-07-21 DIAGNOSIS — R600 Localized edema: Secondary | ICD-10-CM | POA: Diagnosis not present

## 2015-07-21 MED ORDER — LOSARTAN POTASSIUM 100 MG PO TABS: 100.0000 mg | ORAL_TABLET | Freq: Every day | ORAL | Status: DC

## 2015-07-21 NOTE — Patient Instructions (Signed)
Medication Instructions:  STOP Triamterene HCT INCREASE Losartan to 100 mg. You may take 2 tablets of your current prescription until that bottle runs out. A new prescription, with updated instructions, has been sent to your pharmacy electronically.  Labwork: NONE  Testing/Procedures: Your physician has requested that you have an echocardiogram. Echocardiography is a painless test that uses sound waves to create images of your heart. It provides your doctor with information about the size and shape of your heart and how well your heart's chambers and valves are working. This procedure takes approximately one hour. There are no restrictions for this procedure.  This test will be done at our [facility] location. The address is [address], [address]. The phone number is [phone].  Follow-Up: Dr Oval Linsey recommends that you schedule a follow-up appointment in 3 months.  If you need a refill on your cardiac medications before your next appointment, please call your pharmacy.  **Please check your blood pressure daily for the next 2 weeks. If your blood pressures consistently stay around 140/90 or higher please call our office to speak with a nurse.

## 2015-07-21 NOTE — Progress Notes (Signed)
Cardiology Office Note   Date:  07/21/2015   ID:  [name], DOB [birth year], MRN [id number]  PCP:  Geoffery Lyons, MD  Cardiologist:   Sharol Harness, MD   No chief complaint on file.     History of Present Illness: [name] is a 80 y.o. female with hypertension, hyperlipidemia, diabetes mellitus type hyperparathyroidism and morbid obesitywho presents for an evaluation of heart failure.  [name] saw Dr. Burnard Bunting on 07/01/15.  At that appointment Dr. Reynaldo Minium was concerned that [name] may have heart failure and referred her to cardiology for further evaluation.  He was suspicious of diastolic heart failure.  [name] had a visit with Dr. Reynaldo Minium a couple weeks ago and reported a dry, hacking cough.  She thought it may have been due to allergies.  However, Dr. Reynaldo Minium was concerned that her symptoms could have been due to heart failure .  She notes fatigue, shortness of breath and cough.  Her weight increased by 13 pounds in one week and was referred for CXR.  On CXR she was noted to have pulmonary edema.  She was started on lasix in addition to her baseline diuretic.  She was also started on a potassium supplement.  She thinks that her weight is back to baseline.  She still is coughing and has a hard time sleeping at night due to the need to urinate.  The cough is non-productive.  [name] reports shortness of breath with exertion.  She particurly notices this with climbing her stairs.  She endorses orthopnea that has improved somewhat since starting the diuretic.  She denies PND but has noted LE edema.  She denies chest pain, lightheadedness or dizziness.  She does not get much exercise.  She tried to do aerobics at senior center but was unable to keep up.  She is considering joining th [employer] so that she can do water aerobics.  Past Medical History  Diagnosis Date  . Diverticulosis of colon (without mention of hemorrhage) 07/17/2007  . Gastritis  07/17/2007  . GERD (gastroesophageal reflux disease)   . Hyperlipemia   . Osteoarthritis   . Renal calculus, right     severe  . History of fatty infiltration of liver   . Glaucoma   . Hypertension   . Hyperparathyroidism ([place])   . Lumbosacral root lesions, not elsewhere classified 04/13/2014  . Osteoporosis   . Obese     Past Surgical History  Procedure Laterality Date  . Colonoscopy  2008  . Esophagogastroduodenoscopy  2008  . Lumbar fusion  2008  . Knee arthroplasty  2004    RIGHT-TOTAL   . Cholecystectomy  2000  . Rotator cuff repair  2000    RIGHT  . Breast biopsy  1988    LEFT--BENIGN   . Cesarean section  1974  . Appendectomy  1974  . Tubal ligation  1974  . Cesarean section  1971  . Parathyroidectomy  2009    X 2--RIGHT INFERIOR AND LEFT SUPERIOR     Current Outpatient Prescriptions  Medication Sig Dispense Refill  . albuterol (PROVENTIL HFA;VENTOLIN HFA) 108 (90 BASE) MCG/ACT inhaler Inhale 2 puffs into the lungs 4 (four) times daily.    . Calcium Carbonate-Vitamin D (CALCIUM-VITAMIN D) 500-200 MG-UNIT per tablet Take 1 tablet by mouth 2 (two) times daily.    Marland Kitchen docusate sodium (STOOL SOFTENER) 100 MG capsule Take 100 mg by mouth daily as needed for mild constipation.    Marland Kitchen  dorzolamide-timolol (COSOPT) 22.3-6.8 MG/ML ophthalmic solution Place 1 drop into both eyes 2 (two) times daily.     . DULoxetine (CYMBALTA) 60 MG capsule Take 60 mg by mouth daily.    Marland Kitchen esomeprazole (NEXIUM) 40 MG capsule Take 40 mg by mouth daily at 12 noon.    . furosemide (LASIX) 40 MG tablet Take 40 mg by mouth daily.    Marland Kitchen ibuprofen (ADVIL,MOTRIN) 200 MG tablet Take 200 mg by mouth every 6 (six) hours as needed for mild pain.     Marland Kitchen losartan (COZAAR) 50 MG tablet Take 50 mg by mouth daily.    Marland Kitchen LUMIGAN 0.01 % SOLN Place 1 drop into both eyes at bedtime.     Marland Kitchen POLY-IRON 150 150 MG capsule Take 150 mg by mouth 2 (two) times daily.     . potassium chloride (K-DUR) 10 MEQ tablet Take 10 mEq  by mouth daily.    Marland Kitchen triamterene-hydrochlorothiazide (MAXZIDE-25) 37.5-25 MG per tablet Take half (1/2) a tablet alternating with one (1) tablet by mouth daily.    . zoledronic acid (RECLAST) 5 MG/100ML SOLN injection Inject 5 mg into the vein yearly. (Start June 2015)     No current facility-administered medications for this visit.    Allergies:   Codeine and Iohexol    Social History:  The patient  reports that she has quit smoking. She has never used smokeless tobacco. She reports that she drinks alcohol. She reports that she does not use illicit drugs.   Family History:  The patient's  family history includes Colon polyps in her mother; Diabetes in her father; Hypertension in her mother; Osteoarthritis in her mother; Stroke in her father.    ROS:  Please see the history of present illness.   Otherwise, review of systems are positive for neuropathy and falls.   All other systems are reviewed and negative.    PHYSICAL EXAM: VS:  BP 120/70 mmHg  Pulse 71  Ht 5\' 3"  (1.6 m)  Wt 112.628 kg (248 lb 4.8 oz)  BMI 44.00 kg/m2 , BMI Body mass index is 44 kg/(m^2). GENERAL:  Well appearing HEENT:  Pupils equal round and reactive, fundi not visualized, oral mucosa unremarkable NECK:  Difficult to assess JVD 2/2 body habitus.  Neck veins do not appear to be elevated., carotid upstroke brisk and symmetric, no bruits, no thyromegaly LYMPHATICS:  No cervical adenopathy LUNGS:  Clear to auscultation bilaterally HEART:  RRR.  PMI not displaced or sustained,S1 and S2 within normal limits, no S3, no S4, no clicks, no rubs, III/VI early-peaking crescendo-decrescendo systolic murmur at the LUSB ABD:  Flat, positive bowel sounds normal in frequency in pitch, no bruits, no rebound, no guarding, no midline pulsatile mass, no hepatomegaly, no splenomegaly EXT:  2 plus pulses throughout, trace edema to the ankles bilaterally, no cyanosis no clubbing SKIN:  No rashes no nodules NEURO:  Cranial nerves II  through XII grossly intact, motor grossly intact throughout PSYCH:  Cognitively intact, oriented to person place and time    EKG:  EKG is ordered today. The ekg ordered today demonstrates sinus rhythm at 71 bpm.   Recent Labs: No results found for requested labs within last 365 days.   07/16/15: Na 140, K 4.9, BUN 20, Cr 1.4  AST 18, ALT 14 WBC 8.5, Hgb 11.2, hct 33.1, plt 481 Chol 238, tri 162, hdl 59, ldl 147 TSH 1.12  Lipid Panel No results found for: CHOL, TRIG, HDL, CHOLHDL, VLDL, LDLCALC, LDLDIRECT  Wt Readings from Last 3 Encounters:  07/21/15 112.628 kg (248 lb 4.8 oz)  05/14/15 113.501 kg (250 lb 3.6 oz)  03/17/14 112.946 kg (249 lb)      ASSESSMENT AND PLAN:   # LE edema/murmur: [name]' symptoms are certainly concerning for heart failure.  She also has a murmur on exam that is concerning for aortic stenosis.  It sounds mild to moderate, which is unlikely to be causing her symptoms..  We will obtain an echo to assess for heart failure and valvular heart disease.  Continue lasix at current dose as she appears near euvolemic.  # Hypertension: BP is well-controlled today.  However, she is on multiple diuretics and complains of nocutria.  We will stop HCTZ/Triemterene and increase losartan to 100 mg daily.  She will check her BP over the next two weeks.  If her BP is consistently >140/90, she will call us and we will adjust her antihypertensives.  # Morbid obesity: [name] is not getting much exercise.  She is limited by gait instability and pain.  We discussed an aquatic aerobics class and she is considering starting this.  We discussed the recommendation to get 30-40 minutes of exercise most days of the week.  Current medicines are reviewed at length with the patient today.  The patient does not have concerns regarding medicines.  The following changes have been made:  Stop hctz/triameterene and increase losartan to 100 mg daily.  Labs/ tests ordered today  include:  No orders of the defined types were placed in this encounter.     Disposition:   FU with Joyce Kentner C. Oval Linsey, MD in 3 months.   Signed, Sharol Harness, MD  07/21/2015 2:47 PM    [place]

## 2015-07-22 ENCOUNTER — Encounter: Payer: Self-pay | Admitting: Cardiovascular Disease

## 2015-08-04 ENCOUNTER — Encounter: Payer: Self-pay | Admitting: Cardiovascular Disease

## 2015-08-04 ENCOUNTER — Telehealth: Payer: Self-pay | Admitting: Cardiovascular Disease

## 2015-08-04 DIAGNOSIS — R0602 Shortness of breath: Secondary | ICD-10-CM | POA: Diagnosis not present

## 2015-08-04 DIAGNOSIS — Z79899 Other long term (current) drug therapy: Secondary | ICD-10-CM

## 2015-08-04 DIAGNOSIS — G479 Sleep disorder, unspecified: Secondary | ICD-10-CM

## 2015-08-04 DIAGNOSIS — R609 Edema, unspecified: Secondary | ICD-10-CM | POA: Diagnosis not present

## 2015-08-04 DIAGNOSIS — R05 Cough: Secondary | ICD-10-CM | POA: Diagnosis not present

## 2015-08-04 DIAGNOSIS — R058 Other specified cough: Secondary | ICD-10-CM

## 2015-08-04 NOTE — Telephone Encounter (Signed)
Notified patient She will get lab tommorow  [address].

## 2015-08-04 NOTE — Telephone Encounter (Signed)
Patient states she has a dry cough and is coughing so much she cannot sleep.  Can Dr. Oval Linsey call her in something or does she need to see her PCP?

## 2015-08-04 NOTE — Telephone Encounter (Signed)
Returned call to patient. She states swelling better since going from 20mg  to 40mg  lasix daily, still having residual dry cough though, not sleeping well at night d/t this. This has been ongoing for about 2 months. She denies worsening SOB, worsening cough, but states no improvement either. Pt considered calling Dr. Reynaldo Minium, but we are following her for HF.   She has echocardiogram scheduled tomorrow. Wants to know if anything further recommended.  Considered advising temporary increase of lasix, but no recent BMET in chart.  Informed pt I would route to Dr. Oval Linsey for advice.

## 2015-08-04 NOTE — Telephone Encounter (Signed)
Please have her get a BMP and BNP drawn.  Based on my last note she was euvolemic, so I am concerned about increasing her lasix more.  With these labs and her echo we will be better able to tell if her cough is from heart failure.

## 2015-08-05 ENCOUNTER — Other Ambulatory Visit: Payer: Self-pay

## 2015-08-05 ENCOUNTER — Ambulatory Visit (HOSPITAL_COMMUNITY): Payer: Medicare PPO | Attending: Internal Medicine

## 2015-08-05 DIAGNOSIS — R011 Cardiac murmur, unspecified: Secondary | ICD-10-CM

## 2015-08-05 DIAGNOSIS — E119 Type 2 diabetes mellitus without complications: Secondary | ICD-10-CM | POA: Insufficient documentation

## 2015-08-05 DIAGNOSIS — Z6841 Body Mass Index (BMI) 40.0 and over, adult: Secondary | ICD-10-CM | POA: Insufficient documentation

## 2015-08-05 DIAGNOSIS — Z87891 Personal history of nicotine dependence: Secondary | ICD-10-CM | POA: Diagnosis not present

## 2015-08-05 DIAGNOSIS — I10 Essential (primary) hypertension: Secondary | ICD-10-CM | POA: Insufficient documentation

## 2015-08-05 DIAGNOSIS — E669 Obesity, unspecified: Secondary | ICD-10-CM | POA: Diagnosis not present

## 2015-08-05 DIAGNOSIS — R600 Localized edema: Secondary | ICD-10-CM | POA: Diagnosis not present

## 2015-08-05 DIAGNOSIS — I517 Cardiomegaly: Secondary | ICD-10-CM | POA: Diagnosis not present

## 2015-08-05 DIAGNOSIS — E785 Hyperlipidemia, unspecified: Secondary | ICD-10-CM | POA: Insufficient documentation

## 2015-08-05 LAB — BASIC METABOLIC PANEL
BUN: 15 mg/dL (ref 7–25)
CHLORIDE: 101 mmol/L (ref 98–110)
CO2: 26 mmol/L (ref 20–31)
CREATININE: 1.09 mg/dL — AB (ref 0.60–0.93)
Calcium: 9.2 mg/dL (ref 8.6–10.4)
Glucose, Bld: 116 mg/dL — ABNORMAL HIGH (ref 65–99)
Potassium: 4.9 mmol/L (ref 3.5–5.3)
SODIUM: 138 mmol/L (ref 135–146)

## 2015-08-06 LAB — BRAIN NATRIURETIC PEPTIDE: BRAIN NATRIURETIC PEPTIDE: 78.6 pg/mL (ref 0.0–100.0)

## 2015-08-09 ENCOUNTER — Telehealth: Payer: Self-pay | Admitting: *Deleted

## 2015-08-09 NOTE — Telephone Encounter (Signed)
Left message to call back - regards labs Release to my chart

## 2015-08-09 NOTE — Telephone Encounter (Signed)
Left message to cal back 08/11/15

## 2015-08-09 NOTE — Telephone Encounter (Signed)
-----   Message from Skeet Latch, MD sent at 08/09/2015  3:23 PM EST ----- Normal echol

## 2015-08-09 NOTE — Telephone Encounter (Signed)
-----   Message from Skeet Latch, MD sent at 08/09/2015  8:10 AM EST ----- Renal function and electrolytes stable.

## 2015-08-11 ENCOUNTER — Telehealth: Payer: Self-pay | Admitting: *Deleted

## 2015-08-11 NOTE — Telephone Encounter (Signed)
Spoke to patient. Result given . Verbalized understanding  

## 2015-08-11 NOTE — Telephone Encounter (Signed)
Returning your call. °

## 2015-08-11 NOTE — Telephone Encounter (Signed)
Open error 

## 2015-08-18 DIAGNOSIS — H43393 Other vitreous opacities, bilateral: Secondary | ICD-10-CM | POA: Diagnosis not present

## 2015-08-18 DIAGNOSIS — H35372 Puckering of macula, left eye: Secondary | ICD-10-CM | POA: Diagnosis not present

## 2015-08-18 DIAGNOSIS — H401132 Primary open-angle glaucoma, bilateral, moderate stage: Secondary | ICD-10-CM | POA: Diagnosis not present

## 2015-08-18 DIAGNOSIS — H04123 Dry eye syndrome of bilateral lacrimal glands: Secondary | ICD-10-CM | POA: Diagnosis not present

## 2015-08-31 NOTE — Progress Notes (Signed)
Cardiology Office Note   Date:  09/01/2015   ID:  [name], DOB [birth year], MRN [id number]  PCP:  Geoffery Lyons, MD  Cardiologist:   Sharol Harness, MD   Chief Complaint  Patient presents with  . Follow-up    3 mo//ECHO//pt c/o cough and SOB on exertion/pt states swelling has subsided      Patient ID: [name] is a 80 y.o. female with hypertension, hyperlipidemia, diabetes mellitus type hyperparathyroidism and morbid obesitywho presents for an evaluation of heart failure.    Interval History 09/01/15: At her last appointment [name] was referred to echocardiography for evaluation of her aortic valve.  Her antihypertensive regimen was also altered to stop HCTZ/triemterene and start losartan.   Her echo was unremarkable.  BNP was within normal limits.  Her renal function and electrolytes have been stable on losartan.  She continues to have poorly controlled blood pressures at home. She reports that typically her blood pressure is in the [id number] systolic but has been as high as 170. She denies headache or vision changes.  Her lower extremity edema has improved since starting Lasix.  [name]' main concern is her chronic cough that has not improved.She continues to have a hacking cough that is worse with deep inspiration.It is mildly productive of clear sputum. It awakens her from sleep and she notes sinus drainage. She denies any fever or chills. She also denies any sick contacts. Since been ongoing for several months now t is driving her crazy.She is reluctant to take over-the-counter medicines for fear that it may interact with her prescription medications.  She reports a smoking history and quit smoking 2 years ago.  She denies any recent weight loss.   History of Present Illness 07/21/15: [name] saw Dr. Burnard Bunting on 07/01/15.  At that appointment Dr. Reynaldo Minium was concerned that [name] may have heart failure and referred her to cardiology for further  evaluation.  He was suspicious of diastolic heart failure.  [name] had a visit with Dr. Reynaldo Minium a couple weeks ago and reported a dry, hacking cough.  She thought it may have been due to allergies.  However, Dr. Reynaldo Minium was concerned that her symptoms could have been due to heart failure .  She notes fatigue, shortness of breath and cough.  Her weight increased by 13 pounds in one week and was referred for CXR.  On CXR she was noted to have pulmonary edema.  She was started on lasix in addition to her baseline diuretic.  She was also started on a potassium supplement.  She thinks that her weight is back to baseline.  She still is coughing and has a hard time sleeping at night due to the need to urinate.  The cough is non-productive.  [name] reports shortness of breath with exertion.  She particurly notices this with climbing her stairs.  She endorses orthopnea that has improved somewhat since starting the diuretic.  She denies PND but has noted LE edema.  She denies chest pain, lightheadedness or dizziness.  She does not get much exercise.  She tried to do aerobics at senior center but was unable to keep up.  She is considering joining th [employer] so that she can do water aerobics.  Past Medical History  Diagnosis Date  . Diverticulosis of colon (without mention of hemorrhage) 07/17/2007  . Gastritis 07/17/2007  . GERD (gastroesophageal reflux disease)   . Hyperlipemia   . Osteoarthritis   .  Renal calculus, right     severe  . History of fatty infiltration of liver   . Glaucoma   . Hypertension   . Hyperparathyroidism ([place])   . Lumbosacral root lesions, not elsewhere classified 04/13/2014  . Osteoporosis   . Obese     Past Surgical History  Procedure Laterality Date  . Colonoscopy  2008  . Esophagogastroduodenoscopy  2008  . Lumbar fusion  2008  . Knee arthroplasty  2004    RIGHT-TOTAL   . Cholecystectomy  2000  . Rotator cuff repair  2000    RIGHT  . Breast biopsy  1988     LEFT--BENIGN   . Cesarean section  1974  . Appendectomy  1974  . Tubal ligation  1974  . Cesarean section  1971  . Parathyroidectomy  2009    X 2--RIGHT INFERIOR AND LEFT SUPERIOR     Current Outpatient Prescriptions  Medication Sig Dispense Refill  . albuterol (PROVENTIL HFA;VENTOLIN HFA) 108 (90 BASE) MCG/ACT inhaler Inhale 2 puffs into the lungs 4 (four) times daily.    . Calcium Carbonate-Vitamin D (CALCIUM-VITAMIN D) 500-200 MG-UNIT per tablet Take 1 tablet by mouth 2 (two) times daily.    Marland Kitchen docusate sodium (STOOL SOFTENER) 100 MG capsule Take 100 mg by mouth daily as needed for mild constipation.    . dorzolamide-timolol (COSOPT) 22.3-6.8 MG/ML ophthalmic solution Place 1 drop into both eyes 2 (two) times daily.     . DULoxetine (CYMBALTA) 60 MG capsule Take 60 mg by mouth daily.    Marland Kitchen esomeprazole (NEXIUM) 40 MG capsule Take 40 mg by mouth daily at 12 noon.    . furosemide (LASIX) 40 MG tablet Take 40 mg by mouth daily.    Marland Kitchen ibuprofen (ADVIL,MOTRIN) 200 MG tablet Take 200 mg by mouth every 6 (six) hours as needed for mild pain.     Marland Kitchen losartan (COZAAR) 100 MG tablet Take 1 tablet (100 mg total) by mouth daily. 30 tablet 11  . LUMIGAN 0.01 % SOLN Place 1 drop into both eyes at bedtime.     Marland Kitchen POLY-IRON 150 150 MG capsule Take 150 mg by mouth 2 (two) times daily.     . potassium chloride (K-DUR) 10 MEQ tablet Take 10 mEq by mouth daily.    . zoledronic acid (RECLAST) 5 MG/100ML SOLN injection Inject 5 mg into the vein yearly. [facility] June 2015)    . amLODipine (NORVASC) 5 MG tablet Take 1 tablet (5 mg total) by mouth daily. 30 tablet 2  . fluticasone (FLONASE) 50 MCG/ACT nasal spray Place 2 sprays into both nostrils daily. 16 g 0   No current facility-administered medications for this visit.    Allergies:   Codeine and Iohexol    Social History:  The patient  reports that she has quit smoking. She has never used smokeless tobacco. She reports that she drinks alcohol. She reports  that she does not use illicit drugs.   Family History:  The patient's  family history includes Colon polyps in her mother; Diabetes in her father; Hernia in her father; Hypertension in her mother; Liver disease in her paternal grandmother; Osteoarthritis in her maternal grandmother and mother; Stroke in her father, maternal grandfather, and paternal grandfather.    ROS:  Please see the history of present illness.   Otherwise, review of systems are positive for neuropathy and falls.   All other systems are reviewed and negative.    PHYSICAL EXAM: VS:  BP 158/82 mmHg  Pulse  76  Ht 5\' 3"  (1.6 m)  Wt 118.253 kg (260 lb 11.2 oz)  BMI 46.19 kg/m2 , BMI Body mass index is 46.19 kg/(m^2). GENERAL:  Well appearing.  Frequent coughing throughout the interview. HEENT:  Pupils equal round and reactive, fundi not visualized, oral mucosa unremarkable NECK:  Difficult to assess JVD 2/2 body habitus.  Neck veins do not appear to be elevated., carotid upstroke brisk and symmetric, no bruits, no thyromegaly LYMPHATICS:  No cervical adenopathy LUNGS:  Clear to auscultation bilaterally HEART:  RRR.  PMI not displaced or sustained,S1 and S2 within normal limits, no S3, no S4, no clicks, no rubs, III/VI early-peaking crescendo-decrescendo systolic murmur at the LUSB ABD:  Flat, positive bowel sounds normal in frequency in pitch, no bruits, no rebound, no guarding, no midline pulsatile mass, no hepatomegaly, no splenomegaly EXT:  2 plus pulses throughout, no edema, cyanosis no clubbing SKIN:  No rashes no nodules NEURO:  Cranial nerves II through XII grossly intact, motor grossly intact throughout PSYCH:  Cognitively intact, oriented to person place and time    EKG:  EKG is not ordered today.   Recent Labs: 08/04/2015: BUN 15; Creat 1.09*; Potassium 4.9; Sodium 138   07/16/15: Na 140, K 4.9, BUN 20, Cr 1.4  AST 18, ALT 14 WBC 8.5, Hgb 11.2, hct 33.1, plt 481 Chol 238, tri 162, hdl 59, ldl 147 TSH  1.12  Lipid Panel No results found for: CHOL, TRIG, HDL, CHOLHDL, VLDL, LDLCALC, LDLDIRECT    Wt Readings from Last 3 Encounters:  09/01/15 118.253 kg (260 lb 11.2 oz)  07/21/15 112.628 kg (248 lb 4.8 oz)  05/14/15 113.501 kg (250 lb 3.6 oz)      ASSESSMENT AND PLAN:  # Chronic cough:  [name]' cough is not due to heart failure. Given that she reports a lot of postnasal drainage we will start fluticasone nasal spray2 puffs in each nostril daily.  We will also refer her for a CT of the chest to evaluate for malignancy or laryngeal nerve involvement.  # LE edema: Improved on Lasix.There is no evidence that [name] has heart failure, as her echo is normal and her BNP was not elevated. Lasix seems to be doing a better job of controlling her edema, which is likely due to venous stasis.  Given that her renal function and electrolytes have been stable on this medication we will not make any changes at this time. She appears to be euvolemic today.  # Hypertension: BP is poorly-controlled today.  He will continue losartan and Lasix. We will add amlodipine 5 mg daily at bedtime, as she reports that her blood pressure is usually worse later in the day.   # Morbid obesity: [name] is not getting much exercise.  She is limited by gait instability and pain.  We discussed an aquatic aerobics class and she is considering starting this.  We discussed the recommendation to get 30-40 minutes of exercise most days of the week.  Current medicines are reviewed at length with the patient today.  The patient does not have concerns regarding medicines.  The following changes have been made:  Stop hctz/triameterene and increase losartan to 100 mg daily.  Labs/ tests ordered today include:   Orders Placed This Encounter  Procedures  . CT Chest Wo Contrast     Disposition:   FU with Robb Sibal C. Oval Linsey, MD in January.   Signed, Sharol Harness, MD  09/01/2015 9:36 AM    [facility]

## 2015-09-01 ENCOUNTER — Ambulatory Visit (INDEPENDENT_AMBULATORY_CARE_PROVIDER_SITE_OTHER): Payer: Medicare PPO | Admitting: Cardiovascular Disease

## 2015-09-01 ENCOUNTER — Encounter: Payer: Self-pay | Admitting: Cardiovascular Disease

## 2015-09-01 VITALS — BP 158/82 | HR 76 | Ht 63.0 in | Wt 260.7 lb

## 2015-09-01 DIAGNOSIS — R05 Cough: Secondary | ICD-10-CM

## 2015-09-01 DIAGNOSIS — I10 Essential (primary) hypertension: Secondary | ICD-10-CM | POA: Diagnosis not present

## 2015-09-01 DIAGNOSIS — R053 Chronic cough: Secondary | ICD-10-CM

## 2015-09-01 DIAGNOSIS — R600 Localized edema: Secondary | ICD-10-CM | POA: Diagnosis not present

## 2015-09-01 DIAGNOSIS — Z87891 Personal history of nicotine dependence: Secondary | ICD-10-CM

## 2015-09-01 MED ORDER — AMLODIPINE BESYLATE 5 MG PO TABS: 5.0000 mg | ORAL_TABLET | Freq: Every day | ORAL | Status: DC

## 2015-09-01 MED ORDER — FLUTICASONE PROPIONATE 50 MCG/ACT NA SUSP: 2.0000 | Freq: Every day | NASAL | Status: AC

## 2015-09-01 NOTE — Patient Instructions (Addendum)
Medication Instructions:  START AMLODIPINE 5 MG AT BEDTIME, LET us KNOW IF YOU HAVE ANY PROBLEMS TAKING   START FLONASE DAILY, ADDITIONAL REFILLS FROM PRIMARY CARE PHYSICIAN   Labwork: NONE  Testing/Procedures: CT OF THE CHEST WITHOUT CONTRAST  Follow-Up: Your physician recommends that you schedule a follow-up appointment in: IN January   If you need a refill on your cardiac medications before your next appointment, please call your pharmacy.

## 2015-09-01 NOTE — Addendum Note (Signed)
Addended by: Alvina Filbert B on: 09/01/2015 09:40 AM   Modules accepted: Orders, Medications

## 2015-09-23 ENCOUNTER — Ambulatory Visit (INDEPENDENT_AMBULATORY_CARE_PROVIDER_SITE_OTHER)
Admission: RE | Admit: 2015-09-23 | Discharge: 2015-09-23 | Disposition: A | Discharge status: Home / Self Care | Payer: Medicare PPO | Source: Ambulatory Visit | Attending: Cardiovascular Disease | Admitting: Cardiovascular Disease

## 2015-09-23 DIAGNOSIS — R05 Cough: Secondary | ICD-10-CM

## 2015-09-23 DIAGNOSIS — J479 Bronchiectasis, uncomplicated: Secondary | ICD-10-CM | POA: Diagnosis not present

## 2015-09-23 DIAGNOSIS — Z87891 Personal history of nicotine dependence: Secondary | ICD-10-CM | POA: Diagnosis not present

## 2015-09-23 DIAGNOSIS — R053 Chronic cough: Secondary | ICD-10-CM

## 2015-09-30 ENCOUNTER — Telehealth: Payer: Self-pay | Admitting: *Deleted

## 2015-09-30 NOTE — Telephone Encounter (Signed)
-----   Message from Skeet Latch, MD sent at 09/29/2015  7:56 AM EST ----- CT showed pulmonary nodules that are stable from 2011.  It also showed some changes in bases of her lungs that may be causing her cough.  Please refer to Pulmonary.

## 2015-09-30 NOTE — Telephone Encounter (Signed)
Spoke to husband . Patient is sleeping. Patient has a appointment 10/01/15- results will be given at that time.

## 2015-10-01 ENCOUNTER — Ambulatory Visit (INDEPENDENT_AMBULATORY_CARE_PROVIDER_SITE_OTHER): Payer: Medicare PPO | Admitting: Cardiovascular Disease

## 2015-10-01 ENCOUNTER — Encounter: Payer: Self-pay | Admitting: Cardiovascular Disease

## 2015-10-01 VITALS — BP 122/70 | HR 71 | Ht 62.0 in | Wt 255.0 lb

## 2015-10-01 DIAGNOSIS — E785 Hyperlipidemia, unspecified: Secondary | ICD-10-CM | POA: Diagnosis not present

## 2015-10-01 DIAGNOSIS — I10 Essential (primary) hypertension: Secondary | ICD-10-CM | POA: Diagnosis not present

## 2015-10-01 DIAGNOSIS — J479 Bronchiectasis, uncomplicated: Secondary | ICD-10-CM | POA: Diagnosis not present

## 2015-10-01 DIAGNOSIS — Z79899 Other long term (current) drug therapy: Secondary | ICD-10-CM | POA: Diagnosis not present

## 2015-10-01 HISTORY — DX: Bronchiectasis, uncomplicated: J47.9

## 2015-10-01 MED ORDER — PRAVASTATIN SODIUM 10 MG PO TABS: 10.0000 mg | ORAL_TABLET | ORAL | Status: DC

## 2015-10-01 NOTE — Patient Instructions (Signed)
Medication Instructions:  START Pravastatin 10 mg - take 1 tablet 3 times weekly - Monday, Wednesday, and Friday  >>A new prescription has been sent to the pharmacy electronically.  Labwork: Your physician recommends that you return for lab work in 6 weeks - FASTING.  Testing/Procedures: NONE  Follow-Up: Dr Oval Linsey recommends that you schedule a follow-up appointment in 6 months. You will receive a reminder letter in the mail two months in advance. If you don't receive a letter, please call our office to schedule the follow-up appointment.  If you need a refill on your cardiac medications before your next appointment, please call your pharmacy.

## 2015-10-01 NOTE — Progress Notes (Signed)
Cardiology Office Note   Date:  10/01/2015   ID:  [name], DOB 02-27-1935, MRN [id number]  PCP:  Geoffery Lyons, MD  Cardiologist:   Sharol Harness, MD   Chief Complaint  Patient presents with  . Follow-up  . Shortness of Breath  . Edema      Patient ID: [name] is a 80 y.o. female with hypertension, hyperlipidemia, diabetes mellitus type hyperparathyroidism and morbid obesitywho presents for follow up on cough and shortness of breath.    Interval history 10/01/15: At her last appointment [name] was started on Flonase for chronic cough. She was also sent for a CT scan of the chest that showed pulmonary nodules that were unchanged and bronchiectasis of the lower lobes.  It also showed coronary atherosclerosis.  Her blood pressure was high so amlodipine was added to her regimen. She was also encouraged to increase her exercise.  [name] continues to have a daily hacking cough that is occasionally productive. It awakens her from sleep and is worse when taking a deep breath.  She has been otherwise well and denies chest pain.  She continues to have exertional dyspnea.  Her edema is improved on lasix.  She has been intolerant of atorvastatin and rosuvastatin due to myalgias.  She has not tried pravastatin.  She has a history of rectal bleeding on aspirin.  Interval History 09/01/15: At her last appointment [name] was referred to echocardiography for evaluation of her aortic valve.  Her antihypertensive regimen was also altered to stop HCTZ/triemterene and start losartan.   Her echo was unremarkable.  BNP was within normal limits.  Her renal function and electrolytes have been stable on losartan.  She continues to have poorly controlled blood pressures at home. She reports that typically her blood pressure is in the [id number] systolic but has been as high as 170. She denies headache or vision changes.  Her lower extremity edema has improved since starting  Lasix.  [name]' main concern is her chronic cough that has not improved.She continues to have a hacking cough that is worse with deep inspiration.It is mildly productive of clear sputum. It awakens her from sleep and she notes sinus drainage. She denies any fever or chills. She also denies any sick contacts. Since been ongoing for several months now t is driving her crazy.She is reluctant to take over-the-counter medicines for fear that it may interact with her prescription medications.  She reports a smoking history and quit smoking 2 years ago.  She denies any recent weight loss.   History of Present Illness 07/21/15: [name] saw Dr. Burnard Bunting on 07/01/15.  At that appointment Dr. Reynaldo Minium was concerned that [name] may have heart failure and referred her to cardiology for further evaluation.  He was suspicious of diastolic heart failure.  [name] had a visit with Dr. Reynaldo Minium a couple weeks ago and reported a dry, hacking cough.  She thought it may have been due to allergies.  However, Dr. Reynaldo Minium was concerned that her symptoms could have been due to heart failure .  She notes fatigue, shortness of breath and cough.  Her weight increased by 13 pounds in one week and was referred for CXR.  On CXR she was noted to have pulmonary edema.  She was started on lasix in addition to her baseline diuretic.  She was also started on a potassium supplement.  She thinks that her weight is back to baseline.  She  still is coughing and has a hard time sleeping at night due to the need to urinate.  The cough is non-productive.  [name] reports shortness of breath with exertion.  She particurly notices this with climbing her stairs.  She endorses orthopnea that has improved somewhat since starting the diuretic.  She denies PND but has noted LE edema.  She denies chest pain, lightheadedness or dizziness.  She does not get much exercise.  She tried to do aerobics at senior center but was unable to keep  up.  She is considering joining th [employer] so that she can do water aerobics.  Past Medical History  Diagnosis Date  . Diverticulosis of colon (without mention of hemorrhage) 07/17/2007  . Gastritis 07/17/2007  . GERD (gastroesophageal reflux disease)   . Hyperlipemia   . Osteoarthritis   . Renal calculus, right     severe  . History of fatty infiltration of liver   . Glaucoma   . Hypertension   . Hyperparathyroidism ([place])   . Lumbosacral root lesions, not elsewhere classified 04/13/2014  . Osteoporosis   . Obese   . Bronchiectasis ([place]) 10/01/2015    Past Surgical History  Procedure Laterality Date  . Colonoscopy  2008  . Esophagogastroduodenoscopy  2008  . Lumbar fusion  2008  . Knee arthroplasty  2004    RIGHT-TOTAL   . Cholecystectomy  2000  . Rotator cuff repair  2000    RIGHT  . Breast biopsy  1988    LEFT--BENIGN   . Cesarean section  1974  . Appendectomy  1974  . Tubal ligation  1974  . Cesarean section  1971  . Parathyroidectomy  2009    X 2--RIGHT INFERIOR AND LEFT SUPERIOR     Current Outpatient Prescriptions  Medication Sig Dispense Refill  . albuterol (PROVENTIL HFA;VENTOLIN HFA) 108 (90 BASE) MCG/ACT inhaler Inhale 2 puffs into the lungs 4 (four) times daily.    Marland Kitchen amLODipine (NORVASC) 5 MG tablet Take 5 mg by mouth at bedtime.    . Calcium Carbonate-Vitamin D (CALCIUM-VITAMIN D) 500-200 MG-UNIT per tablet Take 1 tablet by mouth 2 (two) times daily.    Marland Kitchen docusate sodium (STOOL SOFTENER) 100 MG capsule Take 100 mg by mouth daily as needed for mild constipation.    . dorzolamide-timolol (COSOPT) 22.3-6.8 MG/ML ophthalmic solution Place 1 drop into both eyes 2 (two) times daily.     Marland Kitchen esomeprazole (NEXIUM) 40 MG capsule Take 40 mg by mouth daily at 12 noon.    . fluticasone (FLONASE) 50 MCG/ACT nasal spray Place 2 sprays into both nostrils daily. 16 g 0  . furosemide (LASIX) 40 MG tablet Take 40 mg by mouth daily.    Marland Kitchen ibuprofen (ADVIL,MOTRIN) 200 MG tablet  Take 200 mg by mouth every 6 (six) hours as needed for mild pain.     Marland Kitchen losartan (COZAAR) 100 MG tablet Take 1 tablet (100 mg total) by mouth daily. 30 tablet 11  . LUMIGAN 0.01 % SOLN Place 1 drop into both eyes at bedtime.     Marland Kitchen POLY-IRON 150 150 MG capsule Take 150 mg by mouth 2 (two) times daily.     . potassium chloride (K-DUR) 10 MEQ tablet Take 10 mEq by mouth daily.    . zoledronic acid (RECLAST) 5 MG/100ML SOLN injection Inject 5 mg into the vein yearly. [facility] June 2015)    . pravastatin (PRAVACHOL) 10 MG tablet Take 1 tablet (10 mg total) by mouth 3 (three) times a  week. Monday, Wednesday, and Friday 15 tablet 11   No current facility-administered medications for this visit.    Allergies:   Codeine and Iohexol    Social History:  The patient  reports that she has quit smoking. She has never used smokeless tobacco. She reports that she drinks alcohol. She reports that she does not use illicit drugs.   Family History:  The patient's  family history includes Colon polyps in her mother; Diabetes in her father; Hernia in her father; Hypertension in her mother; Liver disease in her paternal grandmother; Osteoarthritis in her maternal grandmother and mother; Stroke in her father, maternal grandfather, and paternal grandfather.    ROS:  Please see the history of present illness.   Otherwise, review of systems are positive for neuropathy and falls.   All other systems are reviewed and negative.    PHYSICAL EXAM: VS:  BP 122/70 mmHg  Pulse 71  Ht 5\' 2"  (1.575 m)  Wt 115.667 kg (255 lb)  BMI 46.63 kg/m2 , BMI Body mass index is 46.63 kg/(m^2). GENERAL:  Well appearing.  Frequent coughing throughout the interview. HEENT:  Pupils equal round and reactive, fundi not visualized, oral mucosa unremarkable NECK:  Difficult to assess JVD 2/2 body habitus.  Neck veins do not appear to be elevated., carotid upstroke brisk and symmetric, no bruits, no thyromegaly LYMPHATICS:  No cervical  adenopathy LUNGS:  Clear to auscultation bilaterally HEART:  RRR.  PMI not displaced or sustained,S1 and S2 within normal limits, no S3, no S4, no clicks, no rubs, III/VI early-peaking crescendo-decrescendo systolic murmur at the LUSB ABD:  Flat, positive bowel sounds normal in frequency in pitch, no bruits, no rebound, no guarding, no midline pulsatile mass, no hepatomegaly, no splenomegaly EXT:  2 plus pulses throughout, no edema, cyanosis no clubbing SKIN:  No rashes no nodules NEURO:  Cranial nerves II through XII grossly intact, motor grossly intact throughout PSYCH:  Cognitively intact, oriented to person place and time   EKG:  EKG is not ordered today.  Echo 08/05/15: Study Conclusions  - Left ventricle: The cavity size was normal. Wall thickness was normal. Systolic function was normal. The estimated ejection fraction was in the range of 60% to 65%. Left ventricular diastolic function parameters were normal. - Left atrium: The atrium was mildly to moderately dilated.   Recent Labs: 08/04/2015: BUN 15; Creat 1.09*; Potassium 4.9; Sodium 138   07/16/15: Na 140, K 4.9, BUN 20, Cr 1.4  AST 18, ALT 14 WBC 8.5, Hgb 11.2, hct 33.1, plt 481 Chol 238, tri 162, hdl 59, ldl 147 TSH 1.12  Lipid Panel No results found for: CHOL, TRIG, HDL, CHOLHDL, VLDL, LDLCALC, LDLDIRECT    Wt Readings from Last 3 Encounters:  10/01/15 115.667 kg (255 lb)  09/01/15 118.253 kg (260 lb 11.2 oz)  07/21/15 112.628 kg (248 lb 4.8 oz)      ASSESSMENT AND PLAN:  # Coronary calcification: Incidentally noted on CT and indicates that she has some CAD.  Given that her ASCVD 10 year risk is 62.9%, we recommended starting spirin and an. She has been intolerant of atorvastatin and rosuvastatin. We will start pravastatin 10 mg every Monday, Wednesday and Friday.    # Bronchiectasis, chronic cough: it is possible that the bronchiectasis is the cause of her chronic cough. We have previously ruled out  heart failure, and she has not improved with fluticasone nasal spray. We will refer her to pulmonology for evaluation and management of her bronchiectasis.  #  LE edema: Improved on Lasix.There is no evidence that Ms. Beitel has heart failure, as her echo is normal and her BNP was not elevated. Lasix seems to be doing a better job of controlling her edema, which is likely due to venous stasis.    # Hypertension: BP is well-controlled today.  She will continue losartan and Lasix.   # Morbid obesity: Ms. Redhead is not getting much exercise.  She is limited by gait instability and pain.  We discussed an aquatic aerobics class and she is considering starting this.  We discussed the recommendation to get 30-40 minutes of exercise most days of the week.  Current medicines are reviewed at length with the patient today.  The patient does not have concerns regarding medicines.  The following changes have been made:  Stop hctz/triameterene and increase losartan to 100 mg daily.  Labs/ tests ordered today include:   Orders Placed This Encounter  Procedures  . Lipid panel  . Hepatic function panel  . Ambulatory referral to Pulmonology     Disposition:   FU with Kinsler Soeder C. Oval Linsey, MD in 6 months.   Signed, Sharol Harness, MD  10/01/2015 12:24 PM    [place]

## 2015-10-07 ENCOUNTER — Ambulatory Visit (INDEPENDENT_AMBULATORY_CARE_PROVIDER_SITE_OTHER): Payer: Medicare Other | Admitting: Internal Medicine

## 2015-10-07 ENCOUNTER — Encounter: Payer: Self-pay | Admitting: Internal Medicine

## 2015-10-07 VITALS — BP 114/66 | HR 74 | Ht 62.5 in | Wt 254.4 lb

## 2015-10-07 DIAGNOSIS — J479 Bronchiectasis, uncomplicated: Secondary | ICD-10-CM

## 2015-10-07 MED ORDER — PREDNISONE 10 MG PO TABS: ORAL_TABLET | ORAL | Status: DC

## 2015-10-07 NOTE — Progress Notes (Signed)
Subjective:    Patient ID: [name], female    DOB: 23-Nov-1934,  MRN: [id number]  HPI  89 yowf quit light smoking 09/01/14 with h/o bad pna as child but recovered completely and did but did develop  tendency to bad bronchitis as adult maybe once a year with onset typical pattern summer of 2016 but assoc with sob  > eval  by Cards and referred to pulmonary clinic 10/07/2015 .   10/07/2015 1st [facility] office visit/ [name]   Chief Complaint  Patient presents with  . Pulmonary Consult    Referred by Dr Oval Linsey. Pt c/o DOE with walking up stairs or when she gets in a hurry for the past yr- worse x 6 months. She has had cough for 6 months- only prod in the am with yellow sputum. Cough is esp worse when she lies down.   doe x one year gradually worse esp since started cough/ x 6 m/ worse in amds/  no better with inhalers / cough worse supine assoc with sense of nasal congestion. Sob only with exertion = MMRC1 = can walk nl pace, flat grade, can't hurry or go uphills or steps s sob saba did not help/ note symptoms developed while on timoptic eyedrops    No obvious   patterns in day to day or daytime variabilty or assoc   cp or chest tightness, subjective wheeze.   No unusual exp hx or h/o childhood pna/ asthma or knowledge of premature birth.  Sleeping ok without nocturnal  or early am exacerbation  of respiratory  c/o's or need for noct saba. Also denies any obvious fluctuation of symptoms with weather or environmental changes or other aggravating or alleviating factors except as outlined above   Current Medications, Allergies, Complete Past Medical History, Past Surgical History, Family History, and Social History were reviewed in [employer] record.              Review of Systems  Constitutional: Negative for fever, chills and unexpected weight change.  HENT: Positive for congestion. Negative for dental problem, ear pain, nosebleeds, postnasal drip,  rhinorrhea, sinus pressure, sneezing, sore throat, trouble swallowing and voice change.   Eyes: Negative for visual disturbance.  Respiratory: Positive for cough and shortness of breath. Negative for choking.   Cardiovascular: Positive for leg swelling. Negative for chest pain.  Gastrointestinal: Negative for vomiting, abdominal pain and diarrhea.  Genitourinary: Negative for difficulty urinating.       Acid heartburn  Musculoskeletal: Positive for arthralgias.  Skin: Negative for rash.  Neurological: Negative for tremors, syncope and headaches.  Hematological: Does not bruise/bleed easily.       Objective:   Physical Exam   Hoarse amb obese wf nad  Wt Readings from Last 3 Encounters:  10/07/15 254 lb 6.4 oz (115.395 kg)  10/01/15 255 lb (115.667 kg)  09/01/15 260 lb 11.2 oz (118.253 kg)    Vital signs reviewed   HEENT: nl dentition, turbinates, and oropharynx. Nl external ear canals without cough reflex   NECK :  without JVD/Nodes/TM/ nl carotid upstrokes bilaterally   LUNGS: no acc muscle use,  Nl contour chest which is clear to A and P bilaterally without cough on insp or exp maneuvers   CV:  RRR  no s3 or murmur or increase in P2, no edema   ABD:  soft and nontender with nl inspiratory excursion in the supine position. No bruits or organomegaly, bowel sounds nl  MS:  Nl  gait/ ext warm without deformities, calf tenderness, cyanosis or clubbing No obvious joint restrictions   SKIN: warm and dry without lesions    NEURO:  alert, approp, nl sensorium with  no motor deficits     I personally reviewed images and agree with radiology impression as follows:  CT Chest   09/23/15 1. No change in numerous small pulmonary nodules in the lungs bilaterally since 2011. These are considered benign at this time, and no future imaging followup is recommended. 2. Mild cylindrical bronchiectasis in the lower lobes of the lungs bilaterally.            Assessment &  Plan:

## 2015-10-07 NOTE — Patient Instructions (Addendum)
Continue nexium 40 mg Take 30-60 min before first meal of the day and pepcid ac 20 mg at bedtime and add chlorpheniramine 4 mg 1-2 next if still coughing at bedtime   Call your eye doctor to see if can try betoptic for the timolol component   GERD (REFLUX)  is an extremely common cause of respiratory symptoms just like yours , many times with no obvious heartburn at all.    It can be treated with medication, but also with lifestyle changes including elevation of the head of your bed (ideally with 6 inch  bed blocks),  Smoking cessation, avoidance of late meals, excessive alcohol, and avoid fatty foods, chocolate, peppermint, colas, red wine, and acidic juices such as orange juice.  NO MINT OR MENTHOL PRODUCTS SO NO COUGH DROPS  USE SUGARLESS CANDY INSTEAD (Jolley ranchers or Stover's or Life Savers) or even ice chips will also do - the key is to swallow to prevent all throat clearing. NO OIL BASED VITAMINS - use powdered substitutes.  Prednisone 10 mg take  4 each am x 2 days,   2 each am x 2 days,  1 each am x 2 days and stop   Please schedule a follow up office visit in 2  weeks, sooner if needed  Late add Sinus ct /allergy profile next

## 2015-10-08 ENCOUNTER — Encounter: Payer: Self-pay | Admitting: Internal Medicine

## 2015-10-08 DIAGNOSIS — E669 Obesity, unspecified: Secondary | ICD-10-CM | POA: Insufficient documentation

## 2015-10-08 NOTE — Assessment & Plan Note (Signed)
Confirmed on CT chest 09/23/15   DDX of  difficult airways management all start with A and  include Adherence, Ace Inhibitors, Acid Reflux, Active Sinus Disease, Alpha 1 Antitripsin deficiency, Anxiety masquerading as Airways dz,  ABPA,  allergy(esp in young), Aspiration (esp in elderly), Adverse effects of meds,  Active smokers, A bunch of PE's (a small clot burden can't cause this syndrome unless there is already severe underlying pulm or vascular dz with poor reserve) plus two Bs  = Bronchiectasis and Beta blocker use..and one C= CHF   Adherence is always the initial "prime suspect" and is a multilayered concern that requires a "trust but verify" approach in every patient - starting with knowing how to use medications, especially inhalers, correctly, keeping up with refills and understanding the fundamental difference between maintenance and prns vs those medications only taken for a very short course and then stopped and not refilled.   ? Acid (or non-acid) GERD > always difficult to exclude as up to 75% of pts in some series report no assoc GI/ Heartburn symptoms> rec max (24h)  acid suppression and diet restrictions/ reviewed and instructions given in writing.   ? Allergy/abpa  asthma component > not clear by exam /hx needs IgE to complete the w/u as well as pfts   ? Active sinus ct > needs to schedule at next of if not better  ? Anxiety > usually at the bottom of this list of usual suspects but should be   higher on this pt's based on H and P    ? BBeffects > timolol can cause refractory airway symptoms and note this all started on timoptic  ? chf > excluded by cards w/u just completed   For now focus on rx of gerd ? Get off timoptic and change to betoptic in short run to see if changes symptoms  I had an extended discussion with the patient reviewing all relevant studies completed to date and  Lasting 25  minutes of a 60 minute visit    Each maintenance medication was reviewed in  detail including most importantly the difference between maintenance and prns and under what circumstances the prns are to be triggered using an action plan format that is not reflected in the computer generated alphabetically organized AVS.    Please see instructions for details which were reviewed in writing and the patient given a copy highlighting the part that I personally wrote and discussed at today's ov.

## 2015-10-08 NOTE — Assessment & Plan Note (Signed)
Body mass index is 45.76    Lab Results  Component Value Date   TSH 2.680 03/17/2014     Contributing to gerd tendency/ doe/reviewed the need and the process to achieve and maintain neg calorie balance > defer f/u primary care including intermittently monitoring thyroid status

## 2015-10-22 ENCOUNTER — Other Ambulatory Visit: Payer: Self-pay | Admitting: Internal Medicine

## 2015-10-22 ENCOUNTER — Other Ambulatory Visit (INDEPENDENT_AMBULATORY_CARE_PROVIDER_SITE_OTHER): Payer: Medicare Other

## 2015-10-22 ENCOUNTER — Ambulatory Visit (INDEPENDENT_AMBULATORY_CARE_PROVIDER_SITE_OTHER): Payer: Medicare Other | Admitting: Internal Medicine

## 2015-10-22 ENCOUNTER — Encounter: Payer: Self-pay | Admitting: Internal Medicine

## 2015-10-22 ENCOUNTER — Ambulatory Visit: Payer: Medicare PPO | Admitting: Cardiovascular Disease

## 2015-10-22 VITALS — BP 104/70 | HR 72 | Ht 62.5 in | Wt 254.0 lb

## 2015-10-22 DIAGNOSIS — J479 Bronchiectasis, uncomplicated: Secondary | ICD-10-CM

## 2015-10-22 DIAGNOSIS — J45991 Cough variant asthma: Secondary | ICD-10-CM | POA: Diagnosis not present

## 2015-10-22 LAB — CBC WITH DIFFERENTIAL/PLATELET
BASOS ABS: 0.1 10*3/uL (ref 0.0–0.1)
Basophils Relative: 0.9 % (ref 0.0–3.0)
Eosinophils Absolute: 0.3 10*3/uL (ref 0.0–0.7)
Eosinophils Relative: 2.8 % (ref 0.0–5.0)
HCT: 35.8 % — ABNORMAL LOW (ref 36.0–46.0)
HEMOGLOBIN: 11.2 g/dL — AB (ref 12.0–15.0)
LYMPHS ABS: 2.1 10*3/uL (ref 0.7–4.0)
Lymphocytes Relative: 19.9 % (ref 12.0–46.0)
MCHC: 31.3 g/dL (ref 30.0–36.0)
MCV: 79 fl (ref 78.0–100.0)
MONO ABS: 1.1 10*3/uL — AB (ref 0.1–1.0)
MONOS PCT: 10.1 % (ref 3.0–12.0)
NEUTROS PCT: 66.3 % (ref 43.0–77.0)
Neutro Abs: 6.9 10*3/uL (ref 1.4–7.7)
Platelets: 466 10*3/uL — ABNORMAL HIGH (ref 150.0–400.0)
RBC: 4.53 Mil/uL (ref 3.87–5.11)
RDW: 15.8 % — ABNORMAL HIGH (ref 11.5–15.5)
WBC: 10.4 10*3/uL (ref 4.0–10.5)

## 2015-10-22 MED ORDER — MOMETASONE FURO-FORMOTEROL FUM 100-5 MCG/ACT IN AERO: INHALATION_SPRAY | RESPIRATORY_TRACT | Status: DC

## 2015-10-22 NOTE — Progress Notes (Signed)
Subjective:    Patient ID: [name], female    DOB: 09-24-1935,  MRN: [id number]    Brief patient profile:  24 yowf quit light smoking 09/01/14 with h/o bad pna as child age 81  but recovered completely and did but did develop  tendency to bad bronchitis as adult maybe once a year with onset typical pattern summer of 2016 but assoc with sob  > eval  by Cards and referred to pulmonary clinic 10/07/2015 .    History of Present Illness  10/07/2015 1st [facility] office visit/ [place]   Chief Complaint  Patient presents with  . Pulmonary Consult    Referred by Dr Oval Linsey. Pt c/o DOE with walking up stairs or when she gets in a hurry for the past yr- worse x 6 months. She has had cough for 6 months- only prod in the am with yellow sputum. Cough is esp worse when she lies down.   doe x one year gradually worse esp since started cough/ x 6 m/ worse in ams/  no better with inhalers / cough worse supine assoc with sense of nasal congestion. Sob only with exertion = MMRC1 = can walk nl pace, flat grade, can't hurry or go uphills or steps s sob saba did not help/ note symptoms developed while on timoptic eyedrops   rec Continue nexium 40 mg Take 30-60 min before first meal of the day and pepcid ac 20 mg at bedtime and add chlorpheniramine 4 mg 1-2 next if still coughing at bedtime  Call your eye doctor to see if can try betoptic for the timolol component  GERD  Prednisone 10 mg take  4 each am x 2 days,   2 each am x 2 days,  1 each am x 2 days and stop  Please schedule a follow up office visit in 2  weeks, sooner if needed  Late add Sinus ct /allergy profile next    10/22/2015  f/u ov/Tali Cleaves re:  Chief Complaint  Patient presents with  . Follow-up    Breathing is unchanged. Her cough had improved after last visit but started getting worse over the past wk. Cough is non prod.   noct cough better but not gone, did not try h1 / d/c'd timoptic and substituted per ophth   No obvious day to  day or daytime variability or assoc cp or chest tightness, subjective wheeze or overt sinus or hb symptoms. No unusual exp hx or h/o childhood pna/ asthma or knowledge of premature birth.   Also denies any obvious fluctuation of symptoms with weather or environmental changes or other aggravating or alleviating factors except as outlined above   Current Medications, Allergies, Complete Past Medical History, Past Surgical History, Family History, and Social History were reviewed in [employer] record.  ROS  The following are not active complaints unless bolded sore throat, dysphagia, dental problems, itching, sneezing,  nasal congestion or excess/ purulent secretions, ear ache,   fever, chills, sweats, unintended wt loss, classically pleuritic or exertional cp, hemoptysis,  orthopnea pnd or leg swelling, presyncope, palpitations, abdominal pain, anorexia, nausea, vomiting, diarrhea  or change in bowel or bladder habits, change in stools or urine, dysuria,hematuria,  rash, arthralgias, visual complaints, headache, numbness, weakness or ataxia or problems with walking or coordination,  change in mood/affect or memory.                   Objective:   Physical Exam   Mildly  hoarse amb obese wf nad  10/22/2015       254   10/07/15 254 lb 6.4 oz (115.395 kg)  10/01/15 255 lb (115.667 kg)  09/01/15 260 lb 11.2 oz (118.253 kg)    Vital signs reviewed   HEENT: nl dentition, turbinates, and oropharynx. Nl external ear canals without cough reflex   NECK :  without JVD/Nodes/TM/ nl carotid upstrokes bilaterally   LUNGS: no acc muscle use,  Nl contour chest  - late exp rhonchi / coughing   CV:  RRR  no s3 or murmur or increase in P2, no edema   ABD:  soft and nontender with nl inspiratory excursion in the supine position. No bruits or organomegaly, bowel sounds nl  MS:  Nl gait/ ext warm without deformities, calf tenderness, cyanosis or clubbing No obvious joint  restrictions   SKIN: warm and dry without lesions    NEURO:  alert, approp, nl sensorium with  no motor deficits     I personally reviewed images and agree with radiology impression as follows:  CT Chest   09/23/15 1. No change in numerous small pulmonary nodules in the lungs bilaterally since 2011. These are considered benign at this time, and no future imaging followup is recommended. 2. Mild cylindrical bronchiectasis in the lower lobes of the lungs bilaterally.            Assessment & Plan:

## 2015-10-22 NOTE — Patient Instructions (Addendum)
For drainage / throat tickle try take CHLORPHENIRAMINE  4 mg - take one every 4 hours as needed - available over the counter- may cause drowsiness so start with just a bedtime dose or two and see how you tolerate it before trying in daytime    dulera 100 Take 2 puffs first thing in am and then another 2 puffs about 12 hours later.   Work on inhaler technique:  relax and gently blow all the way out then take a nice smooth deep breath back in, triggering the inhaler at same time you start breathing in.  Hold for up to 5 seconds if you can. Blow out thru nose. Rinse and gargle with water when done   Please see patient coordinator before you leave today  to schedule sinsu ct  Please remember to go to the lab department downstairs for your tests - we will call you with the results when they are available.  Please schedule a follow up office visit in 6 weeks, call sooner if needed with pfts

## 2015-10-23 ENCOUNTER — Encounter: Payer: Self-pay | Admitting: Internal Medicine

## 2015-10-23 DIAGNOSIS — J45991 Cough variant asthma: Secondary | ICD-10-CM | POA: Insufficient documentation

## 2015-10-23 NOTE — Assessment & Plan Note (Signed)
The most common causes of chronic cough in immunocompetent adults include the following: upper airway cough syndrome (UACS), previously referred to as postnasal drip syndrome (PNDS), which is caused by variety of rhinosinus conditions; (2) asthma; (3) GERD; (4) chronic bronchitis from cigarette smoking or other inhaled environmental irritants; (5) nonasthmatic eosinophilic bronchitis; and (6) bronchiectasis.   These conditions, singly or in combination, have accounted for up to 94% of the causes of chronic cough in prospective studies.   Other conditions have constituted no >6% of the causes in prospective studies These have included bronchogenic carcinoma, chronic interstitial pneumonia, sarcoidosis, left ventricular failure, ACEI-induced cough, and aspiration from a condition associated with pharyngeal dysfunction.    Chronic cough is often simultaneously caused by more than one condition. A single cause has been found from 38 to 82% of the time, multiple causes from 18 to 62%. Multiply caused cough has been the result of three diseases up to 42% of the time.      At this point not clear but she did appear to respond transiently to pred suggesting component of asthma or obst bronchietasis/eos bronchitis or rhinitis/ sinusitis (assoc with elevated eos noted also)  rec try dulera/ max rx for gerd and pnds and return for pfts  To sort out  I had an extended discussion with the patient reviewing all relevant studies completed to date and  lasting 15 to 20 minutes of a 25 minute visit    Each maintenance medication was reviewed in detail including most importantly the difference between maintenance and prns and under what circumstances the prns are to be triggered using an action plan format that is not reflected in the computer generated alphabetically organized AVS.    Please see instructions for details which were reviewed in writing and the patient given a copy highlighting the part that I  personally wrote and discussed at today's ov.

## 2015-10-23 NOTE — Assessment & Plan Note (Signed)
Confirmed on CT chest 09/23/15   May have component of obst bronchiectasis contributing to cough   Will try dulera 100 2bid   - The proper method of use, as well as anticipated side effects, of a metered-dose inhaler are discussed and demonstrated to the patient. Improved effectiveness after extensive coaching during this visit to a level of approximately 75 % from a baseline of 50 %

## 2015-10-26 ENCOUNTER — Ambulatory Visit (INDEPENDENT_AMBULATORY_CARE_PROVIDER_SITE_OTHER)
Admission: RE | Admit: 2015-10-26 | Discharge: 2015-10-26 | Disposition: A | Discharge status: Home / Self Care | Payer: Medicare Other | Source: Ambulatory Visit | Attending: Internal Medicine | Admitting: Internal Medicine

## 2015-10-26 DIAGNOSIS — J479 Bronchiectasis, uncomplicated: Secondary | ICD-10-CM | POA: Diagnosis not present

## 2015-10-26 DIAGNOSIS — J45991 Cough variant asthma: Secondary | ICD-10-CM

## 2015-10-26 NOTE — Progress Notes (Signed)
Quick Note:  Spoke with pt and notified of results per Dr. Wert. Pt verbalized understanding and denied any questions.  ______ 

## 2015-10-27 LAB — RESPIRATORY ALLERGY PROFILE REGION II ~~LOC~~
Allergen, Cedar tree, t12: <0.1 kU/L
Allergen, Cottonwood, t14: <0.1 kU/L
Allergen, D pternoyssinus,d7: <0.1 kU/L
Allergen, Mouse Urine Protein, e78: <0.1 kU/L
Cat Dander: <0.1 kU/L
D. farinae: <0.1 kU/L
IGE (IMMUNOGLOBULIN E), SERUM: 60 kU/L (ref <115)
Johnson Grass: <0.1 kU/L
Oak: <0.1 kU/L
Pecan/Hickory Tree IgE: <0.1 kU/L
Rough Pigweed  IgE: <0.1 kU/L
Sheep Sorrel IgE: <0.1 kU/L

## 2015-12-17 ENCOUNTER — Ambulatory Visit (INDEPENDENT_AMBULATORY_CARE_PROVIDER_SITE_OTHER): Payer: Medicare Other | Admitting: Internal Medicine

## 2015-12-17 ENCOUNTER — Encounter: Payer: Self-pay | Admitting: Internal Medicine

## 2015-12-17 VITALS — BP 104/62 | HR 74 | Ht 61.5 in | Wt 252.0 lb

## 2015-12-17 DIAGNOSIS — J45991 Cough variant asthma: Secondary | ICD-10-CM

## 2015-12-17 DIAGNOSIS — J479 Bronchiectasis, uncomplicated: Secondary | ICD-10-CM

## 2015-12-17 LAB — PULMONARY FUNCTION TEST
DL/VA % PRED: 96 %
DL/VA: 4.29 ml/min/mmHg/L
DLCO COR % PRED: 71 %
DLCO COR: 14.95 ml/min/mmHg
DLCO UNC % PRED: 69 %
DLCO unc: 14.42 ml/min/mmHg
FEF 25-75 POST: 2.43 L/s
FEF 25-75 Pre: 2.15 L/sec
FEF2575-%CHANGE-POST: 13 %
FEF2575-%PRED-POST: 178 %
FEF2575-%PRED-PRE: 157 %
FEV1-%Change-Post: -2 %
FEV1-%Pred-Post: 114 %
FEV1-%Pred-Pre: 116 %
FEV1-Post: 2.02 L
FEV1-Pre: 2.06 L
FEV1FVC-%CHANGE-POST: 0 %
FEV1FVC-%PRED-PRE: 115 %
FEV6-%CHANGE-POST: -4 %
FEV6-%Pred-Post: 102 %
FEV6-%Pred-Pre: 107 %
FEV6-PRE: 2.41 L
FEV6-Post: 2.3 L
FEV6FVC-%PRED-PRE: 105 %
FEV6FVC-%Pred-Post: 105 %
FVC-%Change-Post: -2 %
FVC-%PRED-POST: 99 %
FVC-%Pred-Pre: 101 %
FVC-Post: 2.36 L
FVC-Pre: 2.41 L
POST FEV1/FVC RATIO: 86 %
Post FEV6/FVC ratio: 100 %
Pre FEV1/FVC ratio: 86 %
Pre FEV6/FVC Ratio: 100 %
RV % pred: 69 %
RV: 1.56 L
TLC % pred: 83 %
TLC: 3.9 L

## 2015-12-17 NOTE — Progress Notes (Signed)
PFT done today. 12/17/2015

## 2015-12-17 NOTE — Progress Notes (Signed)
Subjective:    Patient ID: [name], female    DOB: 01-02-1935,  MRN: [id number]    Brief patient profile:  57 yowf quit light smoking 09/01/14 with h/o bad pna as child age 81  but recovered completely and did but did develop  tendency to bad bronchitis as adult maybe once a year with onset typical pattern summer of 2016 but assoc with sob  > eval  by Cards and referred to pulmonary clinic 10/07/2015  With no evidence of significant airflow obst by pfts 12/17/2015     History of Present Illness  10/07/2015 1st [facility] office visit/ [name]   Chief Complaint  Patient presents with  . Pulmonary Consult    Referred by Dr Oval Linsey. Pt c/o DOE with walking up stairs or when she gets in a hurry for the past yr- worse x 6 months. She has had cough for 6 months- only prod in the am with yellow sputum. Cough is esp worse when she lies down.   doe x one year gradually worse esp since started cough/ x 6 m/ worse in ams/  no better with inhalers / cough worse supine assoc with sense of nasal congestion. Sob only with exertion = MMRC1 = can walk nl pace, flat grade, can't hurry or go uphills or steps s sob saba did not help/ note symptoms developed while on timoptic eyedrops   rec Continue nexium 40 mg Take 30-60 min before first meal of the day and pepcid ac 20 mg at bedtime and add chlorpheniramine 4 mg 1-2 next if still coughing at bedtime  Call your eye doctor to see if can try betoptic for the timolol component  GERD  Prednisone 10 mg take  4 each am x 2 days,   2 each am x 2 days,  1 each am x 2 days and stop  Please schedule a follow up office visit in 2  weeks, sooner if needed  Late add Sinus ct /allergy profile next    10/22/2015  f/u ov/[name] re: bronchiectasis  Chief Complaint  Patient presents with  . Follow-up    Breathing is unchanged. Her cough had improved after last visit but started getting worse over the past wk. Cough is non prod.   noct cough better but not gone, did  not try h1 / d/c'd timoptic and substituted per ophth rec For drainage / throat tickle try take CHLORPHENIRAMINE  4 mg - take one every 4 hours as needed   Dulera 100 Take 2 puffs first thing in am and then another 2 puffs about 12 hours later.  Work on inhaler technique:     schedule sinus ct > neg       12/17/2015  f/u ov/[name] re:  Bronchiectasis s airflow obst on pfts  Chief Complaint  Patient presents with  . Follow-up    PFT done today.  Breathing is unchanged. She is coughing less. No new co's today.   some extra mucus in am, turned green x 2 weeks  Doe = MMRC1 = can walk nl pace, flat grade, can't hurry or go uphills or steps s sob      No obvious day to day or daytime variability or assoc cp or chest tightness, subjective wheeze or overt sinus or hb symptoms. No unusual exp hx or h/o childhood pna/ asthma or knowledge of premature birth.   Also denies any obvious fluctuation of symptoms with weather or environmental changes or other aggravating or alleviating  factors except as outlined above   Current Medications, Allergies, Complete Past Medical History, Past Surgical History, Family History, and Social History were reviewed in [employer] record.  ROS  The following are not active complaints unless bolded sore throat, dysphagia, dental problems, itching, sneezing,  nasal congestion or excess/ purulent secretions, ear ache,   fever, chills, sweats, unintended wt loss, classically pleuritic or exertional cp, hemoptysis,  orthopnea pnd or leg swelling, presyncope, palpitations, abdominal pain, anorexia, nausea, vomiting, diarrhea  or change in bowel or bladder habits, change in stools or urine, dysuria,hematuria,  rash, arthralgias, visual complaints, headache, numbness, weakness or ataxia or problems with walking or coordination,  change in mood/affect or memory.                   Objective:   Physical Exam   Mildly hoarse amb obese wf  nad  12/17/2015        253 10/22/2015       254   10/07/15 254 lb 6.4 oz (115.395 kg)  10/01/15 255 lb (115.667 kg)  09/01/15 260 lb 11.2 oz (118.253 kg)    Vital signs reviewed   HEENT: nl dentition, turbinates, and oropharynx. Nl external ear canals without cough reflex   NECK :  without JVD/Nodes/TM/ nl carotid upstrokes bilaterally   LUNGS: no acc muscle use,  Nl contour chest  - min bilateral  exp rhonchi     CV:  RRR  no s3 or murmur or increase in P2, no edema   ABD:  soft and nontender with nl inspiratory excursion in the supine position. No bruits or organomegaly, bowel sounds nl  MS:  Nl gait/ ext warm without deformities, calf tenderness, cyanosis or clubbing No obvious joint restrictions   SKIN: warm and dry without lesions    NEURO:  alert, approp, nl sensorium with  no motor deficits     I personally reviewed images and agree with radiology impression as follows:  CT Chest   09/23/15 1. No change in numerous small pulmonary nodules in the lungs bilaterally since 2011. These are considered benign at this time, and no future imaging followup is recommended. 2. Mild cylindrical bronchiectasis in the lower lobes of the lungs bilaterally.            Assessment & Plan:   Outpatient Encounter Prescriptions as of 12/17/2015  Medication Sig  . Calcium Carbonate-Vitamin D (CALCIUM-VITAMIN D) 500-200 MG-UNIT per tablet Take 1 tablet by mouth 2 (two) times daily.  Marland Kitchen docusate sodium (STOOL SOFTENER) 100 MG capsule Take 100 mg by mouth daily as needed for mild constipation.  . dorzolamide (TRUSOPT) 2 % ophthalmic solution as directed.  Marland Kitchen esomeprazole (NEXIUM) 40 MG capsule Take 40 mg by mouth daily before breakfast.   . famotidine (PEPCID) 20 MG tablet Take 20 mg by mouth at bedtime.  . fluticasone (FLONASE) 50 MCG/ACT nasal spray Place 2 sprays into both nostrils daily.  Marland Kitchen ibuprofen (ADVIL,MOTRIN) 200 MG tablet Take 200 mg by mouth every 6 (six) hours as needed  for mild pain.   Marland Kitchen losartan (COZAAR) 100 MG tablet Take 1 tablet (100 mg total) by mouth daily.  Marland Kitchen LUMIGAN 0.01 % SOLN As directed  . mometasone-formoterol (DULERA) 100-5 MCG/ACT AERO Take 2 puffs first thing in am and then another 2 puffs about 12 hours later.  Marland Kitchen POLY-IRON 150 150 MG capsule Take 150 mg by mouth 2 (two) times daily.   . pravastatin (PRAVACHOL) 10 MG tablet Take  1 tablet (10 mg total) by mouth 3 (three) times a week. Monday, Wednesday, and Friday  . triamterene-hydrochlorothiazide (MAXZIDE-25) 37.5-25 MG tablet Take 1 tablet by mouth daily.  . zoledronic acid (RECLAST) 5 MG/100ML SOLN injection Inject 5 mg into the vein yearly. [facility] June 2015)  . [DISCONTINUED] furosemide (LASIX) 40 MG tablet Take 40 mg by mouth daily.  . [DISCONTINUED] potassium chloride (K-DUR) 10 MEQ tablet Take 10 mEq by mouth daily. Reported on 12/17/2015   No facility-administered encounter medications on file as of 12/17/2015.

## 2015-12-17 NOTE — Patient Instructions (Addendum)
For drainage / throat tickle try take CHLORPHENIRAMINE  4 mg - take one every 4 hours as needed - available over the counter- may cause drowsiness so start with just a bedtime dose or two and see how you tolerate it before trying in daytime    Hold off dulera 100 and if getting worse go ahead and restart Take 2 puffs first thing in am and then another 2 puffs about 12 hours later.   Work on inhaler technique:  relax and gently blow all the way out then take a nice smooth deep breath back in, triggering the inhaler at same time you start breathing in.  Hold for up to 5 seconds if you can. Blow out thru nose. Rinse and gargle with water when done   If you are satisfied with your treatment plan,  let your doctor know and he/she can either refill your medications or you can return here when your prescription runs out.     If in any way you are not 100% satisfied,  please tell us.  If 100% better, tell your friends!  Pulmonary follow up is as needed

## 2015-12-21 NOTE — Assessment & Plan Note (Signed)
pfts 12/17/2015 with ERV 15% c/w effects of obesity/ reviewed with pt   Body mass index is 46.85    Lab Results  Component Value Date   TSH 2.680 03/17/2014     Contributing to gerd tendency/ doe/reviewed the need and the process to achieve and maintain neg calorie balance > defer f/u primary care including intermittently monitoring thyroid status

## 2015-12-21 NOTE — Assessment & Plan Note (Addendum)
10/22/2015    try dulera 100 2bid  - Allergy profile 10/22/2015 >  Eos 0.3 /  IgE  60 with neg RAST  - Sinus CT 10/26/2015 :  Neg - PFT's  12/17/2015  wnl except for ERV 15%    > ok to try off dulera and restart for any flare  - The proper method of use, as well as anticipated side effects, of a metered-dose inhaler are discussed and demonstrated to the patient. Improved effectiveness after extensive coaching during this visit to a level of approximately 75 % from a baseline of 50 %   I had an extended final summary discussion with the patient reviewing all relevant studies completed to date and  lasting 15 to 20 minutes of a 25 minute visit on the following issues:    Not clear how much asthma she has but clearly there is no significant obst bronchiectasis here and the best way to sort out asthma issue is a trial off the dulera if see if flares and if so restart then return here if not satisfied with rx.   Each maintenance medication was reviewed in detail including most importantly the difference between maintenance and as needed and under what circumstances the prns are to be used.  Please see instructions for details which were reviewed in writing and the patient given a copy.

## 2016-04-27 ENCOUNTER — Other Ambulatory Visit (HOSPITAL_COMMUNITY): Payer: Self-pay | Admitting: *Deleted

## 2016-04-28 ENCOUNTER — Ambulatory Visit (HOSPITAL_COMMUNITY)
Admission: RE | Admit: 2016-04-28 | Discharge: 2016-04-28 | Disposition: A | Discharge status: Home / Self Care | Payer: Medicare Other | Source: Ambulatory Visit | Attending: Internal Medicine | Admitting: Internal Medicine

## 2016-04-28 DIAGNOSIS — M810 Age-related osteoporosis without current pathological fracture: Secondary | ICD-10-CM | POA: Insufficient documentation

## 2016-04-28 MED ORDER — ZOLEDRONIC ACID 5 MG/100ML IV SOLN
5.0000 mg | Freq: Once | INTRAVENOUS | Status: AC
  Administered 2016-04-28: 5 mg via INTRAVENOUS

## 2016-04-28 MED ORDER — ZOLEDRONIC ACID 5 MG/100ML IV SOLN
INTRAVENOUS | Status: AC
  Administered 2016-04-28: 5 mg via INTRAVENOUS
  Filled 2016-04-28: qty 100

## 2016-05-01 ENCOUNTER — Ambulatory Visit: Payer: Medicare Other | Admitting: Cardiovascular Disease

## 2016-05-01 NOTE — Progress Notes (Deleted)
Cardiology Office Note   Date:  05/01/2016   ID:  [name], DOB 09-24-1935, MRN [id number]  PCP:  Geoffery Lyons, MD  Cardiologist:   Skeet Latch, MD   No chief complaint on file.    History of Present Illness: [name] is a 81 y.o. female with hypertension, hyperlipidemia, diabetes mellitus type hyperparathyroidism and morbid obesitywho presents for follow up.  Ms. [name] was initially seen 07/2015 due to lower extremity edema and shortness of breath. She was referred for an echo 08/2015 that revealed LVEF 60-65% with normal diastolic function.  BNP was within normal limits.  HCTZ/triemterene was discontinued and she was switched to losartan, amlodipine, and lasix.  She was referred for a CT scan to evaluate her chronic cough which showed coronary calcification.  She has previously been intolerant of statins, so she was started on pravastatin.  She has a history of rectal bleeding on aspirin.  Ms. [name] was also referred to Pulmonology for evaluation of her cough and shortness of breath.  She saw Dr. Melvyn Novas who felt that GERD may be contributing.  She was also started on [facility] for her bronchiectasis.  ?Water aerobics.    Past Medical History:  Diagnosis Date  . Bronchiectasis ([place]) 10/01/2015  . Diverticulosis of colon (without mention of hemorrhage) 07/17/2007  . Gastritis 07/17/2007  . GERD (gastroesophageal reflux disease)   . Glaucoma   . History of fatty infiltration of liver   . Hyperlipemia   . Hyperparathyroidism ([place])   . Hypertension   . Lumbosacral root lesions, not elsewhere classified 04/13/2014  . Obese   . Osteoarthritis   . Osteoporosis   . Renal calculus, right    severe    Past Surgical History:  Procedure Laterality Date  . APPENDECTOMY  1974  . BREAST BIOPSY  1988   LEFT--BENIGN   . CESAREAN SECTION  1974  . CESAREAN SECTION  1971  . CHOLECYSTECTOMY  2000  . COLONOSCOPY  2008  . ESOPHAGOGASTRODUODENOSCOPY  2008  . KNEE  ARTHROPLASTY  2004   RIGHT-TOTAL   . LUMBAR FUSION  2008  . PARATHYROIDECTOMY  2009   X 2--RIGHT INFERIOR AND LEFT SUPERIOR  . ROTATOR CUFF REPAIR  2000   RIGHT  . TUBAL LIGATION  1974     Current Outpatient Prescriptions  Medication Sig Dispense Refill  . Calcium Carbonate-Vitamin D (CALCIUM-VITAMIN D) 500-200 MG-UNIT per tablet Take 1 tablet by mouth 2 (two) times daily.    Marland Kitchen docusate sodium (STOOL SOFTENER) 100 MG capsule Take 100 mg by mouth daily as needed for mild constipation.    . dorzolamide (TRUSOPT) 2 % ophthalmic solution as directed.    Marland Kitchen esomeprazole (NEXIUM) 40 MG capsule Take 40 mg by mouth daily before breakfast.     . famotidine (PEPCID) 20 MG tablet Take 20 mg by mouth at bedtime.    . fluticasone (FLONASE) 50 MCG/ACT nasal spray Place 2 sprays into both nostrils daily. 16 g 0  . ibuprofen (ADVIL,MOTRIN) 200 MG tablet Take 200 mg by mouth every 6 (six) hours as needed for mild pain.     Marland Kitchen losartan (COZAAR) 100 MG tablet Take 1 tablet (100 mg total) by mouth daily. 30 tablet 11  . LUMIGAN 0.01 % SOLN As directed    . mometasone-formoterol (DULERA) 100-5 MCG/ACT AERO Take 2 puffs first thing in am and then another 2 puffs about 12 hours later. 1 Inhaler 11  . POLY-IRON 150 150 MG capsule Take 150  mg by mouth 2 (two) times daily.     . pravastatin (PRAVACHOL) 10 MG tablet Take 1 tablet (10 mg total) by mouth 3 (three) times a week. Monday, Wednesday, and Friday 15 tablet 11  . triamterene-hydrochlorothiazide (MAXZIDE-25) 37.5-25 MG tablet Take 1 tablet by mouth daily.    . zoledronic acid (RECLAST) 5 MG/100ML SOLN injection Inject 5 mg into the vein yearly. (Start June 2015)     No current facility-administered medications for this visit.     Allergies:   Codeine and Iohexol    Social History:  The patient  reports that she quit smoking about 20 months ago. Her smoking use included Cigarettes. She has a 3.75 pack-year smoking history. She has never used smokeless  tobacco. She reports that she drinks alcohol. She reports that she does not use drugs.   Family History:  The patient's  family history includes Colon polyps in her mother; Diabetes in her father; Hernia in her father; Hypertension in her mother; Liver disease in her paternal grandmother; Osteoarthritis in her maternal grandmother and mother; Stroke in her father, maternal grandfather, and paternal grandfather.    ROS:  Please see the history of present illness.   Otherwise, review of systems are positive for neuropathy and falls.   All other systems are reviewed and negative.    PHYSICAL EXAM: VS:  There were no vitals taken for this visit. , BMI There is no height or weight on file to calculate BMI. GENERAL:  Well appearing.  Frequent coughing throughout the interview. HEENT:  Pupils equal round and reactive, fundi not visualized, oral mucosa unremarkable NECK:  Difficult to assess JVD 2/2 body habitus.  Neck veins do not appear to be elevated., carotid upstroke brisk and symmetric, no bruits, no thyromegaly LYMPHATICS:  No cervical adenopathy LUNGS:  Clear to auscultation bilaterally HEART:  RRR.  PMI not displaced or sustained,S1 and S2 within normal limits, no S3, no S4, no clicks, no rubs, III/VI early-peaking crescendo-decrescendo systolic murmur at the LUSB ABD:  Flat, positive bowel sounds normal in frequency in pitch, no bruits, no rebound, no guarding, no midline pulsatile mass, no hepatomegaly, no splenomegaly EXT:  2 plus pulses throughout, no edema, cyanosis no clubbing SKIN:  No rashes no nodules NEURO:  Cranial nerves II through XII grossly intact, motor grossly intact throughout PSYCH:  Cognitively intact, oriented to person place and time   EKG:  EKG is not ordered today.  Echo 08/05/15: Study Conclusions  - Left ventricle: The cavity size was normal. Wall thickness was normal. Systolic function was normal. The estimated ejection fraction was in the range of 60% to  65%. Left ventricular diastolic function parameters were normal. - Left atrium: The atrium was mildly to moderately dilated.   Recent Labs: 08/04/2015: Brain Natriuretic Peptide 78.6; BUN 15; Creat 1.09; Potassium 4.9; Sodium 138 10/22/2015: Hemoglobin 11.2; Platelets 466.0   07/16/15: Na 140, K 4.9, BUN 20, Cr 1.4  AST 18, ALT 14 WBC 8.5, Hgb 11.2, hct 33.1, plt 481 Chol 238, tri 162, hdl 59, ldl 147 TSH 1.12  Lipid Panel No results found for: CHOL, TRIG, HDL, CHOLHDL, VLDL, LDLCALC, LDLDIRECT    Wt Readings from Last 3 Encounters:  04/28/16 245 lb (111.1 kg)  12/17/15 252 lb (114.3 kg)  10/22/15 254 lb (115.2 kg)      ASSESSMENT AND PLAN:  # Coronary calcification: Incidentally noted on CT and indicates that she has some CAD.  Given that her ASCVD 10 year risk is  62.9%, we recommended starting spirin and an. She has been intolerant of atorvastatin and rosuvastatin. We will start pravastatin 10 mg every Monday, Wednesday and Friday.    # Bronchiectasis, chronic cough: it is possible that the bronchiectasis is the cause of her chronic cough. We have previously ruled out heart failure, and she has not improved with fluticasone nasal spray. We will refer her to pulmonology for evaluation and management of her bronchiectasis.  # LE edema: Improved on Lasix.There is no evidence that Ms. Laur has heart failure, as her echo is normal and her BNP was not elevated. Lasix seems to be doing a better job of controlling her edema, which is likely due to venous stasis.    # Hypertension: BP is well-controlled today.  She will continue losartan and Lasix.   # Morbid obesity: Ms. Torner is not getting much exercise.  She is limited by gait instability and pain.  We discussed an aquatic aerobics class and she is considering starting this.  We discussed the recommendation to get 30-40 minutes of exercise most days of the week.  Current medicines are reviewed at length with the patient today.   The patient does not have concerns regarding medicines.  The following changes have been made:  Stop hctz/triameterene and increase losartan to 100 mg daily.  Labs/ tests ordered today include:   No orders of the defined types were placed in this encounter.    Disposition:   FU with Orie Cuttino C. Oval Linsey, MD in 6 months.   Signed, Skeet Latch, MD  05/01/2016 5:46 AM    [place]

## 2016-06-07 ENCOUNTER — Ambulatory Visit: Payer: Medicare Other | Admitting: Cardiovascular Disease

## 2016-06-09 ENCOUNTER — Other Ambulatory Visit: Payer: Self-pay | Admitting: Orthopaedic Surgery

## 2016-06-09 DIAGNOSIS — M5137 Other intervertebral disc degeneration, lumbosacral region: Secondary | ICD-10-CM

## 2016-06-20 ENCOUNTER — Ambulatory Visit
Admission: RE | Admit: 2016-06-20 | Discharge: 2016-06-20 | Disposition: A | Discharge status: Home / Self Care | Payer: Medicare Other | Source: Ambulatory Visit | Attending: Orthopaedic Surgery | Admitting: Orthopaedic Surgery

## 2016-06-20 DIAGNOSIS — M5137 Other intervertebral disc degeneration, lumbosacral region: Secondary | ICD-10-CM

## 2016-06-30 ENCOUNTER — Ambulatory Visit (INDEPENDENT_AMBULATORY_CARE_PROVIDER_SITE_OTHER): Payer: Medicare Other | Admitting: Cardiovascular Disease

## 2016-06-30 VITALS — BP 116/64 | HR 72 | Ht 62.0 in | Wt 249.0 lb

## 2016-06-30 DIAGNOSIS — I259 Chronic ischemic heart disease, unspecified: Secondary | ICD-10-CM | POA: Diagnosis not present

## 2016-06-30 DIAGNOSIS — I10 Essential (primary) hypertension: Secondary | ICD-10-CM

## 2016-06-30 DIAGNOSIS — E7800 Pure hypercholesterolemia, unspecified: Secondary | ICD-10-CM | POA: Diagnosis not present

## 2016-06-30 NOTE — Patient Instructions (Signed)
Medication Instructions:  Your physician recommends that you continue on your current medications as directed. Please refer to the Current Medication list given to you today.  Labwork: NONE  Testing/Procedures: NONE  Follow-Up: Your physician wants you to follow-up in: [place] You will receive a reminder letter in the mail two months in advance. If you don't receive a letter, please call our office to schedule the follow-up appointment.  Your physician wants you to follow-up in: [place] You will receive a reminder letter in the mail two months in advance. If you don't receive a letter, please call our office to schedule the follow-up appointment.  If you need a refill on your cardiac medications before your next appointment, please call your pharmacy.

## 2016-06-30 NOTE — Progress Notes (Signed)
Cardiology Office Note   Date:  06/30/2016   ID:  [name], DOB 1935-09-04, MRN [id number]  PCP:  Geoffery Lyons, MD  Cardiologist:   Skeet Latch, MD   Chief Complaint  Patient presents with  . Follow-up    pt denied chest pain, pt c/o swelling in legs nad feet,and SOB     Patient ID: [name] is a 81 y.o. female with asymptomatic coronary calcification, hypertension, hyperlipidemia, diabetes mellitus type hyperparathyroidism and morbid obesity who presents for follow up.  [name] was initially seen a 07/2015 with shortness of breath and cough. Her PCP was concerned that she may have had heart failure. She had an echo 08/05/15 revealed LVEF 60-65%.  BNP was unremarkable.  She has struggled with poorly controlled hypertension and HCTZ/triamterene was switched to losartan.  Lasix was added to her regimen with improvement in her lower extremity edema.  She was previously intolerant of atorvastatin and rosuvastatin so she was started on pravastatin 3 times weekly.  She continued to complain of a nagging cough so she was started on Flonase and had a chest CT that showed pulmonary nodules but no cause for cough. She was referred to pulmonology and saw Dr. Melvyn Novas on 10/2015.  He was concerned that her symptoms may be attributable to GERD and started her on a PPI.  He felt that there was a component of obstructive bronchiectasis and started her on Dulera.  However, she continues to have a cough.  She feels like she has a cough reflex that won't stop.   She notes that her breathing has been good.  She denies chest pain.  She hasn't noticed any lower extremity edema recently.  She was started on Lyrica and developed lower extremity edema.  This improved after stopping the Lyrica.  [name] hasn't been exercising much lately.  She has severe back pain and has follow up with her surgeon later this week.  She recently had an MRI and my need surgery.  She has fallen twice in the last  year and feel steady on her feet.  She tripped walking across the floor. In the past she tried taking cholesterol medication but was unable to take pravastatin, rosuvastatin or atorvastatin.  She has rectal bleeding every time she tries to take aspirin.  Past Medical History:  Diagnosis Date  . Bronchiectasis ([place]) 10/01/2015  . Diverticulosis of colon (without mention of hemorrhage) 07/17/2007  . Gastritis 07/17/2007  . GERD (gastroesophageal reflux disease)   . Glaucoma   . History of fatty infiltration of liver   . Hyperlipemia   . Hyperparathyroidism ([place])   . Hypertension   . Lumbosacral root lesions, not elsewhere classified 04/13/2014  . Obese   . Osteoarthritis   . Osteoporosis   . Renal calculus, right    severe    Past Surgical History:  Procedure Laterality Date  . APPENDECTOMY  1974  . BREAST BIOPSY  1988   LEFT--BENIGN   . CESAREAN SECTION  1974  . CESAREAN SECTION  1971  . CHOLECYSTECTOMY  2000  . COLONOSCOPY  2008  . ESOPHAGOGASTRODUODENOSCOPY  2008  . KNEE ARTHROPLASTY  2004   RIGHT-TOTAL   . LUMBAR FUSION  2008  . PARATHYROIDECTOMY  2009   X 2--RIGHT INFERIOR AND LEFT SUPERIOR  . ROTATOR CUFF REPAIR  2000   RIGHT  . TUBAL LIGATION  1974     Current Outpatient Prescriptions  Medication Sig Dispense Refill  . Calcium Carbonate-Vitamin D (  CALCIUM-VITAMIN D) 500-200 MG-UNIT per tablet Take 1 tablet by mouth 2 (two) times daily.    Marland Kitchen docusate sodium (STOOL SOFTENER) 100 MG capsule Take 100 mg by mouth daily as needed for mild constipation.    . dorzolamide (TRUSOPT) 2 % ophthalmic solution as directed.    Marland Kitchen esomeprazole (NEXIUM) 40 MG capsule Take 40 mg by mouth daily before breakfast.     . fluticasone (FLONASE) 50 MCG/ACT nasal spray Place 2 sprays into both nostrils daily. 16 g 0  . ibuprofen (ADVIL,MOTRIN) 200 MG tablet Take 200 mg by mouth every 6 (six) hours as needed for mild pain.     Marland Kitchen losartan (COZAAR) 100 MG tablet Take 1 tablet (100 mg total)  by mouth daily. 30 tablet 11  . LUMIGAN 0.01 % SOLN As directed    . POLY-IRON 150 150 MG capsule Take 150 mg by mouth 2 (two) times daily.     . traMADol (ULTRAM) 50 MG tablet Take 1 tablet by mouth as needed.    . triamterene-hydrochlorothiazide (MAXZIDE-25) 37.5-25 MG tablet Take 1 tablet by mouth daily.    . zoledronic acid (RECLAST) 5 MG/100ML SOLN injection Inject 5 mg into the vein yearly. (Start June 2015)     No current facility-administered medications for this visit.     Allergies:   Codeine and Iohexol    Social History:  The patient  reports that she quit smoking about 21 months ago. Her smoking use included Cigarettes. She has a 3.75 pack-year smoking history. She has never used smokeless tobacco. She reports that she drinks alcohol. She reports that she does not use drugs.   Family History:  The patient's  family history includes Colon polyps in her mother; Diabetes in her father; Hernia in her father; Hypertension in her mother; Liver disease in her paternal grandmother; Osteoarthritis in her maternal grandmother and mother; Stroke in her father, maternal grandfather, and paternal grandfather.    ROS:  Please see the history of present illness.   Otherwise, review of systems are positive for neuropathy and falls.   All other systems are reviewed and negative.    PHYSICAL EXAM: VS:  BP 116/64   Pulse 72   Ht 5\' 2"  (1.575 m)   Wt 249 lb (112.9 kg)   BMI 45.54 kg/m  , BMI Body mass index is 45.54 kg/m. GENERAL:  Well appearing.  Frequent coughing throughout the interview. HEENT:  Pupils equal round and reactive, fundi not visualized, oral mucosa unremarkable NECK:  Difficult to assess JVD 2/2 body habitus.  Neck veins do not appear to be elevated., carotid upstroke brisk and symmetric, no bruits LYMPHATICS:  No cervical adenopathy LUNGS:  Clear to auscultation bilaterally HEART:  RRR.  PMI not displaced or sustained,S1 and S2 within normal limits, no S3, no S4, no  clicks, no rubs, III/VI early-peaking crescendo-decrescendo systolic murmur at the LUSB ABD:  Flat, positive bowel sounds normal in frequency in pitch, no bruits, no rebound, no guarding, no midline pulsatile mass, no hepatomegaly, no splenomegaly EXT:  2 plus pulses throughout, no edema, cyanosis no clubbing SKIN:  No rashes no nodules NEURO:  Cranial nerves II through XII grossly intact, motor grossly intact throughout PSYCH:  Cognitively intact, oriented to person place and time   EKG:  EKG is ordered today. 06/30/16: Sinus rhythm rate 72 bpm.  LAFB.   Echo 08/05/15: Study Conclusions  - Left ventricle: The cavity size was normal. Wall thickness was normal. Systolic function was normal. The  estimated ejection fraction was in the range of 60% to 65%. Left ventricular diastolic function parameters were normal. - Left atrium: The atrium was mildly to moderately dilated.   Recent Labs: 08/04/2015: Brain Natriuretic Peptide 78.6; BUN 15; Creat 1.09; Potassium 4.9; Sodium 138 10/22/2015: Hemoglobin 11.2; Platelets 466.0   07/16/15: Na 140, K 4.9, BUN 20, Cr 1.4  AST 18, ALT 14 WBC 8.5, Hgb 11.2, hct 33.1, plt 481 Chol 238, tri 162, hdl 59, ldl 147 TSH 1.12  Lipid Panel No results found for: CHOL, TRIG, HDL, CHOLHDL, VLDL, LDLCALC, LDLDIRECT    Wt Readings from Last 3 Encounters:  06/30/16 249 lb (112.9 kg)  04/28/16 245 lb (111.1 kg)  12/17/15 252 lb (114.3 kg)      ASSESSMENT AND PLAN:  # Coronary calcification: Incidentally noted on CT and indicates that she has some CAD.  Given that her ASCVD 10 year risk is 62.9%, we recommended starting aspirin and a statin.  She is unable to tolerate aspirin due to bleeding.  She has not tolerated pravastatin, atorvastatin or rosuvastatin.    # LE edema: Improved on Lasix.  BNP is normal so there does not seem to be a heart failure component.    # Hypertension: BP is well-controlled on losartan, HCTZ and triamterene.  # Morbid  obesity: [name] is not getting much exercise.  She is limited by gait instability and pain.  She will get in ijections in her SI joints and hopes to be able to start exercising after that.   Current medicines are reviewed at length with the patient today.  The patient does not have concerns regarding medicines.  The following changes have been made:  None  Labs/ tests ordered today include:   No orders of the defined types were placed in this encounter.    Disposition:   FU with Bebe Moncure C. Oval Linsey, MD in 1 year and with Rosaria Ferries in 6 months.   Signed, Skeet Latch, MD  06/30/2016 10:07 AM    [place]

## 2016-07-04 ENCOUNTER — Encounter: Payer: Self-pay | Admitting: Cardiovascular Disease

## 2016-07-21 ENCOUNTER — Other Ambulatory Visit: Payer: Self-pay | Admitting: Cardiovascular Disease

## 2016-07-24 NOTE — Telephone Encounter (Signed)
Please review for refill. Thanks!  

## 2016-09-07 ENCOUNTER — Other Ambulatory Visit (HOSPITAL_COMMUNITY): Payer: Self-pay | Admitting: Surgery

## 2016-09-07 DIAGNOSIS — E349 Endocrine disorder, unspecified: Secondary | ICD-10-CM

## 2016-09-12 ENCOUNTER — Ambulatory Visit (HOSPITAL_COMMUNITY)
Admission: RE | Admit: 2016-09-12 | Discharge: 2016-09-12 | Disposition: A | Discharge status: Home / Self Care | Payer: Medicare Other | Source: Ambulatory Visit | Attending: Surgery | Admitting: Surgery

## 2016-09-12 DIAGNOSIS — E349 Endocrine disorder, unspecified: Secondary | ICD-10-CM | POA: Insufficient documentation

## 2016-09-12 MED ORDER — TECHNETIUM TC 99M SESTAMIBI GENERIC - CARDIOLITE
24.6000 | Freq: Once | INTRAVENOUS | Status: AC | PRN
  Administered 2016-09-12: 24.6 via INTRAVENOUS

## 2016-09-18 ENCOUNTER — Other Ambulatory Visit: Payer: Self-pay | Admitting: Surgery

## 2016-09-18 DIAGNOSIS — D351 Benign neoplasm of parathyroid gland: Secondary | ICD-10-CM

## 2016-09-22 ENCOUNTER — Ambulatory Visit
Admission: RE | Admit: 2016-09-22 | Discharge: 2016-09-22 | Disposition: A | Discharge status: Home / Self Care | Payer: Medicare Other | Source: Ambulatory Visit | Attending: Surgery | Admitting: Surgery

## 2016-09-22 DIAGNOSIS — D351 Benign neoplasm of parathyroid gland: Secondary | ICD-10-CM

## 2016-10-05 ENCOUNTER — Other Ambulatory Visit: Payer: Self-pay | Admitting: Surgery

## 2016-10-05 DIAGNOSIS — E041 Nontoxic single thyroid nodule: Secondary | ICD-10-CM

## 2016-10-10 ENCOUNTER — Other Ambulatory Visit: Payer: Medicare Other

## 2016-10-10 ENCOUNTER — Other Ambulatory Visit (HOSPITAL_COMMUNITY)
Admission: RE | Admit: 2016-10-10 | Discharge: 2016-10-10 | Disposition: A | Discharge status: Home / Self Care | Payer: Medicare Other | Source: Ambulatory Visit | Attending: General Surgery | Admitting: General Surgery

## 2016-10-10 ENCOUNTER — Ambulatory Visit
Admission: RE | Admit: 2016-10-10 | Discharge: 2016-10-10 | Disposition: A | Discharge status: Home / Self Care | Payer: Medicare Other | Source: Ambulatory Visit | Attending: Surgery | Admitting: Surgery

## 2016-10-10 DIAGNOSIS — E041 Nontoxic single thyroid nodule: Secondary | ICD-10-CM

## 2016-10-20 DIAGNOSIS — G609 Hereditary and idiopathic neuropathy, unspecified: Secondary | ICD-10-CM | POA: Insufficient documentation

## 2016-10-20 DIAGNOSIS — G894 Chronic pain syndrome: Secondary | ICD-10-CM | POA: Insufficient documentation

## 2016-11-16 ENCOUNTER — Other Ambulatory Visit: Payer: Self-pay | Admitting: Surgery

## 2016-11-16 DIAGNOSIS — E042 Nontoxic multinodular goiter: Secondary | ICD-10-CM

## 2016-11-17 ENCOUNTER — Other Ambulatory Visit: Payer: Self-pay | Admitting: Surgery

## 2016-11-17 NOTE — Progress Notes (Signed)
Pt called and 13 hour prep explained to pt. Called prep in [facility].

## 2016-11-21 ENCOUNTER — Other Ambulatory Visit: Payer: Self-pay | Admitting: Surgery

## 2016-11-21 ENCOUNTER — Ambulatory Visit
Admission: RE | Admit: 2016-11-21 | Discharge: 2016-11-21 | Disposition: A | Discharge status: Home / Self Care | Payer: Medicare Other | Source: Ambulatory Visit | Attending: Surgery | Admitting: Surgery

## 2016-12-27 ENCOUNTER — Ambulatory Visit: Payer: Self-pay | Admitting: Surgery

## 2017-01-01 ENCOUNTER — Encounter (HOSPITAL_COMMUNITY): Payer: Self-pay

## 2017-01-01 ENCOUNTER — Ambulatory Visit (HOSPITAL_COMMUNITY)
Admission: RE | Admit: 2017-01-01 | Discharge: 2017-01-01 | Disposition: A | Discharge status: Home / Self Care | Payer: Medicare Other | Source: Ambulatory Visit | Attending: Anesthesiology | Admitting: Anesthesiology

## 2017-01-01 ENCOUNTER — Encounter (HOSPITAL_COMMUNITY)
Admission: RE | Admit: 2017-01-01 | Discharge: 2017-01-01 | Disposition: A | Discharge status: Home / Self Care | Payer: Medicare Other | Source: Ambulatory Visit | Attending: Surgery | Admitting: Surgery

## 2017-01-01 ENCOUNTER — Encounter (HOSPITAL_COMMUNITY): Payer: Self-pay | Admitting: *Deleted

## 2017-01-01 DIAGNOSIS — Z01818 Encounter for other preprocedural examination: Secondary | ICD-10-CM | POA: Diagnosis not present

## 2017-01-01 DIAGNOSIS — M810 Age-related osteoporosis without current pathological fracture: Secondary | ICD-10-CM | POA: Diagnosis not present

## 2017-01-01 DIAGNOSIS — E210 Primary hyperparathyroidism: Secondary | ICD-10-CM

## 2017-01-01 DIAGNOSIS — M1990 Unspecified osteoarthritis, unspecified site: Secondary | ICD-10-CM | POA: Insufficient documentation

## 2017-01-01 DIAGNOSIS — Z01812 Encounter for preprocedural laboratory examination: Secondary | ICD-10-CM | POA: Insufficient documentation

## 2017-01-01 DIAGNOSIS — E213 Hyperparathyroidism, unspecified: Secondary | ICD-10-CM | POA: Insufficient documentation

## 2017-01-01 DIAGNOSIS — I10 Essential (primary) hypertension: Secondary | ICD-10-CM | POA: Insufficient documentation

## 2017-01-01 HISTORY — DX: Primary hyperparathyroidism: E21.0

## 2017-01-01 LAB — BASIC METABOLIC PANEL
ANION GAP: 6 (ref 5–15)
BUN: 18 mg/dL (ref 6–20)
CALCIUM: 9.7 mg/dL (ref 8.9–10.3)
CO2: 26 mmol/L (ref 22–32)
Chloride: 105 mmol/L (ref 101–111)
Creatinine, Ser: 1.35 mg/dL — ABNORMAL HIGH (ref 0.44–1.00)
GFR, EST AFRICAN AMERICAN: 42 mL/min — AB (ref >60)
GFR, EST NON AFRICAN AMERICAN: 36 mL/min — AB (ref >60)
Glucose, Bld: 130 mg/dL — ABNORMAL HIGH (ref 65–99)
POTASSIUM: 4.3 mmol/L (ref 3.5–5.1)
Sodium: 137 mmol/L (ref 135–145)

## 2017-01-01 LAB — CBC
HEMATOCRIT: 30.8 % — AB (ref 36.0–46.0)
HEMOGLOBIN: 9.5 g/dL — AB (ref 12.0–15.0)
MCH: 24.8 pg — ABNORMAL LOW (ref 26.0–34.0)
MCHC: 30.8 g/dL (ref 30.0–36.0)
MCV: 80.4 fL (ref 78.0–100.0)
Platelets: 412 10*3/uL — ABNORMAL HIGH (ref 150–400)
RBC: 3.83 MIL/uL — AB (ref 3.87–5.11)
RDW: 15.2 % (ref 11.5–15.5)
WBC: 6.7 10*3/uL (ref 4.0–10.5)

## 2017-01-01 LAB — GLUCOSE, CAPILLARY: Glucose-Capillary: 120 mg/dL — ABNORMAL HIGH (ref 65–99)

## 2017-01-01 NOTE — Progress Notes (Signed)
EKG-06/30/2016-epic  08/05/15- ECHO-epic  06/30/2016- LOV- Cardiology- epic

## 2017-01-01 NOTE — H&P (Signed)
General Surgery [facility]  [place] 12/27/2016 11:15 AM Location: [facility] Patient #: [id number] DOB: 11/08/1934 Married / Language: Cleophus Molt / Race: White Female   History of Present Illness Earnstine Regal MD; 12/27/2016 11:50 AM) The patient is a 82 year old female who presents with primary hyperparathyroidism.  Patient returns to discuss her studies for evaluation of recurrent primary hyperparathyroidism. Patient had undergone previous neck exploration with removal of a right inferior parathyroid gland. She also had removal of a left superior parathyroid gland and biopsy of a left inferior gland. Nuclear medicine parathyroid scan suggested the presence of a right superior parathyroid adenoma. Ultrasound examination demonstrated thyroid nodules. Subsequent fine-needle aspiration biopsy was benign. CT scan of the neck suggested a right sided thyroid nodule versus right parathyroid adenoma. Patient comes to discuss the results of the studies.   Problem List/Past Medical Earnstine Regal, MD; 12/27/2016 11:52 AM) ELEVATED PARATHYROID HORMONE (E34.9)  HYPERCALCEMIA (E83.52)  PRIMARY HYPERPARATHYROIDISM (E21.0)  MULTIPLE THYROID NODULES (E52.7)   Past Surgical History Earnstine Regal, MD; 12/27/2016 11:52 AM) Cesarean Section - 1  Knee Surgery  Right. Shoulder Surgery  Right. Spinal Surgery - Lower Back  Thyroid Surgery   Diagnostic Studies History Earnstine Regal, MD; 12/27/2016 11:52 AM) Colonoscopy  5-10 years ago Mammogram  1-3 years ago Pap Smear  1-5 years ago  Allergies Earnstine Regal, MD; 12/27/2016 11:52 AM) Codeine and Related  Vomiting, Nausea. ASPIRIN  Rash. Allergies Reconciled  Iodinated Diagnostic Agents   Medication History (Yasmin Izola Price, CMA; 12/27/2016 11:17 AM) Calcium Carbonate-Vitamin D (500-200MG -UNIT Tablet, Oral daily) Active. Colace (100MG  Capsule, Oral as needed) Active. Trusopt (2%  Solution, Ophthalmic daily) Active. NexIUM (40MG  Capsule DR, Oral daily) Active. Flonase (50MCG/ACT Suspension, Nasal as needed) Active. Cozaar (100MG  Tablet, Oral daily) Active. Poly-Iron 150 (150MG  Capsule, Oral every other day) Active. Ultram (50MG  Tablet, Oral two times daily) Active. Maxzide-25 (37.5-25MG  Tablet, Oral as needed) Active. Zoledronic Acid (5MG /100ML Solution, Intravenous once a year) Active. Medications Reconciled  Social History Earnstine Regal, MD; 12/27/2016 11:52 AM) Alcohol use  Occasional alcohol use. Caffeine use  Carbonated beverages, Coffee. No drug use  Tobacco use  Former smoker.  Family History Earnstine Regal, MD; 12/27/2016 11:52 AM) Arthritis  Mother. Depression  Father. Diabetes Mellitus  Father. Heart Disease  Father. Hypertension  Father, Mother. Migraine Headache  Daughter, Son. Thyroid problems  Mother.  Pregnancy / Birth History Earnstine Regal, MD; 12/27/2016 11:52 AM) Age at menarche  70 years. Age of menopause  27-50 Length (months) of breastfeeding  3-6 Maternal age  13-30 Para  2 Regular periods   Other Problems Earnstine Regal, MD; 12/27/2016 11:52 AM) Arthritis  Back Pain  Bladder Problems  Depression  Gastroesophageal Reflux Disease  Hypercholesterolemia  Kidney Stone   Vitals (Yasmin Roberson CMA; 12/27/2016 11:22 AM) 12/27/2016 11:22 AM Weight: 228.2 lb Height: 63in Body Surface Area: 2.05 m Body Mass Index: 40.42 kg/m  Temp.: 58F  Pulse: 79 (Regular)  BP: 118/66 (Sitting, Left Arm, Standard)       Physical Exam Earnstine Regal MD; 12/27/2016 11:51 AM) The physical exam findings are as follows: Note:General - appears comfortable, no distress; not diaphorectic  HEENT - normocephalic; sclerae clear, gaze conjugate; mucous membranes moist, dentition good; voice normal  Neck - asymmetric on extension; no palpable anterior or posterior cervical adenopathy; no palpable  masses in the thyroid bed  Chest - clear bilaterally without rhonchi, rales, or  wheeze  Cor - regular rhythm with normal rate; no significant murmur  Ext - non-tender with moderate edema or lymphedema  Neuro - grossly intact; no tremor    Assessment & Plan Earnstine Regal MD; 12/27/2016 11:52 AM) PRIMARY HYPERPARATHYROIDISM (E21.0) Current Plans Patient presents to discuss recent diagnostic studies regarding primary hyperparathyroidism. While the studies are not definitive, it appears that there may be a right superior parathyroid adenoma. The patient and I have discussed the Exploration for right superior parathyroidectomy. We discussed the possibility of biopsy or excision of a right thyroid nodule. We discussed risk and benefits of the procedure as well as scheduling her for an overnight stay for observation and laboratory work on the following morning. She understands and wishes to proceed with surgery.  The risks and benefits of the procedure have been discussed at length with the patient. The patient understands the proposed procedure, potential alternative treatments, and the course of recovery to be expected. All of the patient's questions have been answered at this time. The patient wishes to proceed with surgery.  Earnstine Regal, MD, [facility] Office: [phone]

## 2017-01-01 NOTE — Progress Notes (Signed)
01-01-17 CBC, BMP faxed to Dr. Harlow Asa via [facility]

## 2017-01-01 NOTE — Patient Instructions (Signed)
GODDESS GEBBIA  01/01/2017   Your procedure is scheduled on: 01/04/2017    Report to [facility] Main  Entrance take [place]  elevators to 3rd floor to  [place] at   [place] AM.  Call this number if you have problems the morning of surgery [phone]   Remember: ONLY 1 PERSON MAY GO WITH YOU TO SHORT STAY TO GET  READY MORNING OF YOUR SURGERY.  Do not eat food or drink liquids :After Midnight.     Take these medicines the morning of surgery with A SIP OF WATER: Eye drops as usual , Nexium                                 You may not have any metal on your body including hair pins and              piercings  Do not wear jewelry, make-up, lotions, powders or perfumes, deodorant             Do not wear nail polish.  Do not shave  48 hours prior to surgery.     Do not bring valuables to the hospital. Portal.  Contacts, dentures or bridgework may not be worn into surgery.  Leave suitcase in the car. After surgery it may be brought to your room.         Special Instructions: N/A              Please read over the following fact sheets you were given: _____________________________________________________________________             [facility] - Preparing for Surgery Before surgery, you can play an important role.  Because skin is not sterile, your skin needs to be as free of germs as possible.  You can reduce the number of germs on your skin by washing with CHG (chlorahexidine gluconate) soap before surgery.  CHG is an antiseptic cleaner which kills germs and bonds with the skin to continue killing germs even after washing. Please DO NOT use if you have an allergy to CHG or antibacterial soaps.  If your skin becomes reddened/irritated stop using the CHG and inform your nurse when you arrive at Short Stay. Do not shave (including legs and underarms) for at least 48 hours prior to the first CHG shower.  You  may shave your face/neck. Please follow these instructions carefully:  1.  Shower with CHG Soap the night before surgery and the  morning of Surgery.  2.  If you choose to wash your hair, wash your hair first as usual with your  normal  shampoo.  3.  After you shampoo, rinse your hair and body thoroughly to remove the  shampoo.                           4.  Use CHG as you would any other liquid soap.  You can apply chg directly  to the skin and wash                       Gently with a scrungie or clean washcloth.  5.  Apply the CHG Soap  to your body ONLY FROM THE NECK DOWN.   Do not use on face/ open                           Wound or open sores. Avoid contact with eyes, ears mouth and genitals (private parts).                       Wash face,  Genitals (private parts) with your normal soap.             6.  Wash thoroughly, paying special attention to the area where your surgery  will be performed.  7.  Thoroughly rinse your body with warm water from the neck down.  8.  DO NOT shower/wash with your normal soap after using and rinsing off  the CHG Soap.                9.  Pat yourself dry with a clean towel.            10.  Wear clean pajamas.            11.  Place clean sheets on your bed the night of your first shower and do not  sleep with pets. Day of Surgery : Do not apply any lotions/deodorants the morning of surgery.  Please wear clean clothes to the hospital/surgery center.  FAILURE TO FOLLOW THESE INSTRUCTIONS MAY RESULT IN THE CANCELLATION OF YOUR SURGERY PATIENT SIGNATURE_________________________________  NURSE SIGNATURE__________________________________  ________________________________________________________________________

## 2017-01-02 LAB — HEMOGLOBIN A1C
Hgb A1c MFr Bld: 6.2 % — ABNORMAL HIGH (ref 4.8–5.6)
Mean Plasma Glucose: 131 mg/dL

## 2017-01-04 ENCOUNTER — Encounter (HOSPITAL_COMMUNITY): Payer: Self-pay

## 2017-01-04 ENCOUNTER — Ambulatory Visit (HOSPITAL_COMMUNITY): Payer: Medicare Other | Admitting: Certified Registered Nurse Anesthetist

## 2017-01-04 ENCOUNTER — Encounter (HOSPITAL_COMMUNITY)
Admission: RE | Disposition: A | Discharge status: Home / Self Care | Payer: Self-pay | Source: Ambulatory Visit | Attending: Surgery

## 2017-01-04 ENCOUNTER — Observation Stay (HOSPITAL_COMMUNITY)
Admission: RE | Admit: 2017-01-04 | Discharge: 2017-01-05 | Disposition: A | Discharge status: Home / Self Care | Payer: Medicare Other | Source: Ambulatory Visit | Attending: Surgery | Admitting: Surgery

## 2017-01-04 DIAGNOSIS — Z885 Allergy status to narcotic agent status: Secondary | ICD-10-CM | POA: Diagnosis not present

## 2017-01-04 DIAGNOSIS — Z87891 Personal history of nicotine dependence: Secondary | ICD-10-CM | POA: Insufficient documentation

## 2017-01-04 DIAGNOSIS — Z818 Family history of other mental and behavioral disorders: Secondary | ICD-10-CM | POA: Insufficient documentation

## 2017-01-04 DIAGNOSIS — Z8349 Family history of other endocrine, nutritional and metabolic diseases: Secondary | ICD-10-CM | POA: Diagnosis not present

## 2017-01-04 DIAGNOSIS — K219 Gastro-esophageal reflux disease without esophagitis: Secondary | ICD-10-CM | POA: Insufficient documentation

## 2017-01-04 DIAGNOSIS — Z91041 Radiographic dye allergy status: Secondary | ICD-10-CM | POA: Diagnosis not present

## 2017-01-04 DIAGNOSIS — Z888 Allergy status to other drugs, medicaments and biological substances status: Secondary | ICD-10-CM | POA: Diagnosis not present

## 2017-01-04 DIAGNOSIS — E042 Nontoxic multinodular goiter: Secondary | ICD-10-CM | POA: Insufficient documentation

## 2017-01-04 DIAGNOSIS — E210 Primary hyperparathyroidism: Principal | ICD-10-CM | POA: Insufficient documentation

## 2017-01-04 DIAGNOSIS — M549 Dorsalgia, unspecified: Secondary | ICD-10-CM | POA: Diagnosis not present

## 2017-01-04 DIAGNOSIS — Z8261 Family history of arthritis: Secondary | ICD-10-CM | POA: Diagnosis not present

## 2017-01-04 DIAGNOSIS — Z79899 Other long term (current) drug therapy: Secondary | ICD-10-CM | POA: Insufficient documentation

## 2017-01-04 DIAGNOSIS — Z8249 Family history of ischemic heart disease and other diseases of the circulatory system: Secondary | ICD-10-CM | POA: Insufficient documentation

## 2017-01-04 DIAGNOSIS — Z87442 Personal history of urinary calculi: Secondary | ICD-10-CM | POA: Insufficient documentation

## 2017-01-04 DIAGNOSIS — Z886 Allergy status to analgesic agent status: Secondary | ICD-10-CM | POA: Diagnosis not present

## 2017-01-04 DIAGNOSIS — E7800 Pure hypercholesterolemia, unspecified: Secondary | ICD-10-CM | POA: Diagnosis not present

## 2017-01-04 DIAGNOSIS — F329 Major depressive disorder, single episode, unspecified: Secondary | ICD-10-CM | POA: Diagnosis not present

## 2017-01-04 DIAGNOSIS — Z833 Family history of diabetes mellitus: Secondary | ICD-10-CM | POA: Diagnosis not present

## 2017-01-04 DIAGNOSIS — E213 Hyperparathyroidism, unspecified: Secondary | ICD-10-CM | POA: Diagnosis present

## 2017-01-04 DIAGNOSIS — Z820 Family history of epilepsy and other diseases of the nervous system: Secondary | ICD-10-CM | POA: Insufficient documentation

## 2017-01-04 HISTORY — PX: PARATHYROIDECTOMY: SHX19

## 2017-01-04 HISTORY — DX: Other specified postprocedural states: Z98.890

## 2017-01-04 HISTORY — DX: Other specified postprocedural states: R11.2

## 2017-01-04 SURGERY — PARATHYROIDECTOMY
Anesthesia: General

## 2017-01-04 MED ORDER — LACTATED RINGERS IV SOLN
INTRAVENOUS | Status: DC
  Administered 2017-01-04: 1000 mL via INTRAVENOUS
  Administered 2017-01-04: 11:00:00 via INTRAVENOUS

## 2017-01-04 MED ORDER — LOSARTAN POTASSIUM 50 MG PO TABS
100.0000 mg | ORAL_TABLET | Freq: Every day | ORAL | Status: DC
  Administered 2017-01-04: 100 mg via ORAL
  Filled 2017-01-04: qty 2

## 2017-01-04 MED ORDER — PROPOFOL 10 MG/ML IV BOLUS
INTRAVENOUS | Status: DC | PRN
  Administered 2017-01-04: 150 mg via INTRAVENOUS

## 2017-01-04 MED ORDER — BUPIVACAINE HCL 0.25 % IJ SOLN
INTRAMUSCULAR | Status: DC | PRN
  Administered 2017-01-04: 10 mL

## 2017-01-04 MED ORDER — ONDANSETRON HCL 4 MG/2ML IJ SOLN
INTRAMUSCULAR | Status: AC
  Filled 2017-01-04: qty 2

## 2017-01-04 MED ORDER — MIDAZOLAM HCL 2 MG/2ML IJ SOLN: 0.5000 mg | Freq: Once | INTRAMUSCULAR | Status: DC | PRN

## 2017-01-04 MED ORDER — FENTANYL CITRATE (PF) 100 MCG/2ML IJ SOLN
INTRAMUSCULAR | Status: DC | PRN
  Administered 2017-01-04: 100 ug via INTRAVENOUS

## 2017-01-04 MED ORDER — PROMETHAZINE HCL 25 MG/ML IJ SOLN: 6.2500 mg | INTRAMUSCULAR | Status: DC | PRN

## 2017-01-04 MED ORDER — SUFENTANIL CITRATE 50 MCG/ML IV SOLN
INTRAVENOUS | Status: DC | PRN
  Administered 2017-01-04 (×2): 5 ug via INTRAVENOUS

## 2017-01-04 MED ORDER — PROPOFOL 10 MG/ML IV BOLUS
INTRAVENOUS | Status: AC
  Filled 2017-01-04: qty 20

## 2017-01-04 MED ORDER — HYDROMORPHONE HCL 1 MG/ML IJ SOLN
0.2500 mg | INTRAMUSCULAR | Status: DC | PRN
  Administered 2017-01-04 (×4): 0.25 mg via INTRAVENOUS

## 2017-01-04 MED ORDER — TRIAMTERENE-HCTZ 37.5-25 MG PO TABS
1.0000 | ORAL_TABLET | Freq: Every day | ORAL | Status: DC
  Administered 2017-01-04: 1 via ORAL
  Filled 2017-01-04 (×3): qty 1

## 2017-01-04 MED ORDER — MIDAZOLAM HCL 2 MG/2ML IJ SOLN
INTRAMUSCULAR | Status: AC
  Filled 2017-01-04: qty 2

## 2017-01-04 MED ORDER — DEXAMETHASONE SODIUM PHOSPHATE 10 MG/ML IJ SOLN
INTRAMUSCULAR | Status: AC
  Filled 2017-01-04: qty 1

## 2017-01-04 MED ORDER — ONDANSETRON HCL 4 MG/2ML IJ SOLN
INTRAMUSCULAR | Status: DC | PRN
  Administered 2017-01-04: 4 mg via INTRAVENOUS

## 2017-01-04 MED ORDER — LATANOPROST 0.005 % OP SOLN
1.0000 [drp] | Freq: Every day | OPHTHALMIC | Status: DC
  Administered 2017-01-04: 1 [drp] via OPHTHALMIC
  Filled 2017-01-04: qty 2.5

## 2017-01-04 MED ORDER — SUGAMMADEX SODIUM 200 MG/2ML IV SOLN
INTRAVENOUS | Status: DC | PRN
  Administered 2017-01-04: 400 mg via INTRAVENOUS

## 2017-01-04 MED ORDER — BUPIVACAINE HCL (PF) 0.25 % IJ SOLN
INTRAMUSCULAR | Status: AC
  Filled 2017-01-04: qty 30

## 2017-01-04 MED ORDER — LIDOCAINE 2% (20 MG/ML) 5 ML SYRINGE
INTRAMUSCULAR | Status: AC
  Filled 2017-01-04: qty 5

## 2017-01-04 MED ORDER — MIDAZOLAM HCL 5 MG/5ML IJ SOLN
INTRAMUSCULAR | Status: DC | PRN
  Administered 2017-01-04: 2 mg via INTRAVENOUS

## 2017-01-04 MED ORDER — CHLORHEXIDINE GLUCONATE CLOTH 2 % EX PADS: 6.0000 | MEDICATED_PAD | Freq: Once | CUTANEOUS | Status: DC

## 2017-01-04 MED ORDER — DEXAMETHASONE SODIUM PHOSPHATE 4 MG/ML IJ SOLN
INTRAMUSCULAR | Status: DC | PRN
  Administered 2017-01-04: 10 mg via INTRAVENOUS

## 2017-01-04 MED ORDER — HYDROCODONE-ACETAMINOPHEN 5-325 MG PO TABS
1.0000 | ORAL_TABLET | ORAL | Status: DC | PRN
  Administered 2017-01-04 (×3): 1 via ORAL
  Filled 2017-01-04 (×3): qty 1

## 2017-01-04 MED ORDER — ROCURONIUM BROMIDE 50 MG/5ML IV SOSY
PREFILLED_SYRINGE | INTRAVENOUS | Status: AC
  Filled 2017-01-04: qty 5

## 2017-01-04 MED ORDER — LIDOCAINE 2% (20 MG/ML) 5 ML SYRINGE
INTRAMUSCULAR | Status: DC | PRN
  Administered 2017-01-04: 50 mg via INTRAVENOUS

## 2017-01-04 MED ORDER — DORZOLAMIDE HCL 2 % OP SOLN
1.0000 [drp] | Freq: Two times a day (BID) | OPHTHALMIC | Status: DC
  Administered 2017-01-04: 1 [drp] via OPHTHALMIC
  Filled 2017-01-04: qty 10

## 2017-01-04 MED ORDER — ACETAMINOPHEN 325 MG PO TABS
650.0000 mg | ORAL_TABLET | Freq: Four times a day (QID) | ORAL | Status: DC | PRN
  Administered 2017-01-04: 650 mg via ORAL
  Filled 2017-01-04: qty 2

## 2017-01-04 MED ORDER — ROCURONIUM BROMIDE 50 MG/5ML IV SOSY
PREFILLED_SYRINGE | INTRAVENOUS | Status: DC | PRN
  Administered 2017-01-04: 10 mg via INTRAVENOUS
  Administered 2017-01-04: 50 mg via INTRAVENOUS

## 2017-01-04 MED ORDER — ONDANSETRON 4 MG PO TBDP: 4.0000 mg | ORAL_TABLET | Freq: Four times a day (QID) | ORAL | Status: DC | PRN

## 2017-01-04 MED ORDER — HYDROMORPHONE HCL 1 MG/ML IJ SOLN
INTRAMUSCULAR | Status: AC
  Filled 2017-01-04: qty 1

## 2017-01-04 MED ORDER — CEFAZOLIN SODIUM-DEXTROSE 2-4 GM/100ML-% IV SOLN
2.0000 g | INTRAVENOUS | Status: AC
  Administered 2017-01-04: 2 g via INTRAVENOUS
  Filled 2017-01-04: qty 100

## 2017-01-04 MED ORDER — PHENYLEPHRINE 40 MCG/ML (10ML) SYRINGE FOR IV PUSH (FOR BLOOD PRESSURE SUPPORT)
PREFILLED_SYRINGE | INTRAVENOUS | Status: DC | PRN
  Administered 2017-01-04 (×2): 40 ug via INTRAVENOUS
  Administered 2017-01-04: 80 ug via INTRAVENOUS

## 2017-01-04 MED ORDER — SUFENTANIL CITRATE 50 MCG/ML IV SOLN
INTRAVENOUS | Status: AC
  Filled 2017-01-04: qty 1

## 2017-01-04 MED ORDER — ONDANSETRON HCL 4 MG/2ML IJ SOLN: 4.0000 mg | Freq: Four times a day (QID) | INTRAMUSCULAR | Status: DC | PRN

## 2017-01-04 MED ORDER — MEPERIDINE HCL 50 MG/ML IJ SOLN: 6.2500 mg | INTRAMUSCULAR | Status: DC | PRN

## 2017-01-04 MED ORDER — HYDROMORPHONE HCL 1 MG/ML IJ SOLN
1.0000 mg | INTRAMUSCULAR | Status: DC | PRN
Start: 2017-01-04 — End: 2017-01-05

## 2017-01-04 MED ORDER — SODIUM CHLORIDE 0.9 % IR SOLN
Status: DC | PRN
  Administered 2017-01-04: 1000 mL

## 2017-01-04 MED ORDER — ACETAMINOPHEN 650 MG RE SUPP: 650.0000 mg | Freq: Four times a day (QID) | RECTAL | Status: DC | PRN

## 2017-01-04 MED ORDER — FENTANYL CITRATE (PF) 250 MCG/5ML IJ SOLN
INTRAMUSCULAR | Status: AC
  Filled 2017-01-04: qty 5

## 2017-01-04 MED ORDER — KCL IN DEXTROSE-NACL 20-5-0.45 MEQ/L-%-% IV SOLN
INTRAVENOUS | Status: DC
  Administered 2017-01-04: 15:00:00 via INTRAVENOUS
  Filled 2017-01-04 (×2): qty 1000

## 2017-01-04 MED ORDER — LACTATED RINGERS IV SOLN: INTRAVENOUS | Status: DC

## 2017-01-04 SURGICAL SUPPLY — 35 items
ATTRACTOMAT 16X20 MAGNETIC DRP (DRAPES) ×3 IMPLANT
BLADE HEX COATED 2.75 (ELECTRODE) ×3 IMPLANT
BLADE SURG 15 STRL LF DISP TIS (BLADE) ×1 IMPLANT
BLADE SURG 15 STRL SS (BLADE) ×2
CHLORAPREP W/TINT 26ML (MISCELLANEOUS) ×6 IMPLANT
CLIP TI MEDIUM 6 (CLIP) ×6 IMPLANT
CLIP TI WIDE RED SMALL 6 (CLIP) ×6 IMPLANT
CLOSURE WOUND 1/2 X4 (GAUZE/BANDAGES/DRESSINGS) ×1
DERMABOND ADVANCED (GAUZE/BANDAGES/DRESSINGS)
DERMABOND ADVANCED .7 DNX12 (GAUZE/BANDAGES/DRESSINGS) IMPLANT
DRAPE LAPAROTOMY T 98X78 PEDS (DRAPES) ×3 IMPLANT
ELECT PENCIL ROCKER SW 15FT (MISCELLANEOUS) ×3 IMPLANT
ELECT REM PT RETURN 15FT ADLT (MISCELLANEOUS) ×3 IMPLANT
GAUZE SPONGE 4X4 16PLY XRAY LF (GAUZE/BANDAGES/DRESSINGS) ×3 IMPLANT
GLOVE SURG ORTHO 8.0 STRL STRW (GLOVE) ×3 IMPLANT
GOWN STRL REUS W/TWL XL LVL3 (GOWN DISPOSABLE) ×9 IMPLANT
HEMOSTAT SURGICEL 2X4 FIBR (HEMOSTASIS) ×3 IMPLANT
ILLUMINATOR WAVEGUIDE N/F (MISCELLANEOUS) ×3 IMPLANT
KIT BASIN OR (CUSTOM PROCEDURE TRAY) ×3 IMPLANT
LIGHT WAVEGUIDE WIDE FLAT (MISCELLANEOUS) IMPLANT
NEEDLE HYPO 25X1 1.5 SAFETY (NEEDLE) ×3 IMPLANT
PACK BASIC VI WITH GOWN DISP (CUSTOM PROCEDURE TRAY) ×3 IMPLANT
STAPLER VISISTAT 35W (STAPLE) IMPLANT
STRIP CLOSURE SKIN 1/2X4 (GAUZE/BANDAGES/DRESSINGS) ×2 IMPLANT
SUT MNCRL AB 4-0 PS2 18 (SUTURE) ×3 IMPLANT
SUT SILK 2 0 (SUTURE)
SUT SILK 2-0 18XBRD TIE 12 (SUTURE) IMPLANT
SUT SILK 3 0 (SUTURE)
SUT SILK 3-0 18XBRD TIE 12 (SUTURE) IMPLANT
SUT VIC AB 3-0 SH 18 (SUTURE) ×3 IMPLANT
SYR BULB IRRIGATION 50ML (SYRINGE) ×3 IMPLANT
SYR CONTROL 10ML LL (SYRINGE) ×3 IMPLANT
TOWEL OR 17X26 10 PK STRL BLUE (TOWEL DISPOSABLE) ×3 IMPLANT
TOWEL OR NON WOVEN STRL DISP B (DISPOSABLE) ×3 IMPLANT
YANKAUER SUCT BULB TIP 10FT TU (MISCELLANEOUS) ×3 IMPLANT

## 2017-01-04 NOTE — Brief Op Note (Signed)
01/04/2017  11:14 AM  PATIENT:  Gaspar Bidding  82 y.o. female  PRE-OPERATIVE DIAGNOSIS:  Primary hyperparathyroidism  POST-OPERATIVE DIAGNOSIS:  Primary hyperparathyroidism  PROCEDURE:  1. Right neck exploration  2. Multiple thyroid biopsies  3. Right thyroid lobectomy  SURGEON:  Surgeon(s) and Role:    * Armandina Gemma, MD - Primary  ANESTHESIA:   general  EBL:  Total I/O In: 1000 [I.V.:1000] Out: 5 [Blood:5]  BLOOD ADMINISTERED:none  DRAINS: none   LOCAL MEDICATIONS USED:  MARCAINE     SPECIMEN:  Excision  DISPOSITION OF SPECIMEN:  PATHOLOGY  COUNTS:  YES  TOURNIQUET:  * No tourniquets in log *  DICTATION: .Other Dictation: Dictation Number [id number]  PLAN OF CARE: Admit for overnight observation  PATIENT DISPOSITION:  PACU - hemodynamically stable.   Delay start of Pharmacological VTE agent (>24hrs) due to surgical blood loss or risk of bleeding: yes  Earnstine Regal, MD, [facility] Office: [phone]

## 2017-01-04 NOTE — Anesthesia Preprocedure Evaluation (Signed)
Anesthesia Evaluation  Patient identified by MRN, date of birth, ID band Patient awake    Reviewed: Allergy & Precautions, NPO status , Patient's Chart, lab work & pertinent test results  History of Anesthesia Complications (+) PONV  Airway Mallampati: I  TM Distance: >3 FB Neck ROM: Full    Dental  (+) Dental Advisory Given, Caps   Pulmonary Recent URI , Resolved, former smoker (quit 2015),    breath sounds clear to auscultation       Cardiovascular hypertension, Pt. on medications (-) angina Rhythm:Regular Rate:Normal  ' 16 ECHO: EF 60-65%, valves OK   Neuro/Psych Depression Recent vertigo Chronic back pain    GI/Hepatic Neg liver ROS, GERD  Medicated, Controlled and Poorly Controlled,  Endo/Other  negative endocrine ROSMorbid obesity  Renal/GU stones     Musculoskeletal  (+) Arthritis ,   Abdominal   Peds  Hematology  (+) Blood dyscrasia (Hb 9.5), anemia ,   Anesthesia Other Findings   Reproductive/Obstetrics                             Anesthesia Physical Anesthesia Plan  ASA: III  Anesthesia Plan: General   Post-op Pain Management:    Induction: Intravenous  Airway Management Planned: Oral ETT  Additional Equipment:   Intra-op Plan:   Post-operative Plan: Extubation in OR  Informed Consent: I have reviewed the patients History and Physical, chart, labs and discussed the procedure including the risks, benefits and alternatives for the proposed anesthesia with the patient or authorized representative who has indicated his/her understanding and acceptance.   Dental advisory given  Plan Discussed with: CRNA and Surgeon  Anesthesia Plan Comments: (Plan routine monitors, GETA)        Anesthesia Quick Evaluation

## 2017-01-04 NOTE — Transfer of Care (Signed)
Immediate Anesthesia Transfer of Care Note  Patient: [name]  Procedure(s) Performed: Procedure(s): RIGHT NECK EXPLORATION WITH PARATHYROIDECTOMY (N/A)  Patient Location: PACU  Anesthesia Type:General  Level of Consciousness:  sedated, patient cooperative and responds to stimulation  Airway & Oxygen Therapy:Patient Spontanous Breathing and Patient connected to face mask oxgen  Post-op Assessment:  Report given to PACU RN and Post -op Vital signs reviewed and stable  Post vital signs:  Reviewed and stable  Last Vitals:  Vitals:   01/04/17 0730  BP: 131/68  Pulse: 80  Resp: 16  Temp: 38.1 C    Complications: No apparent anesthesia complications

## 2017-01-04 NOTE — Interval H&P Note (Signed)
History and Physical Interval Note:  01/04/2017 8:41 AM  [name]  has presented today for surgery, with the diagnosis of Primary hyperparathyroidism.  The various methods of treatment have been discussed with the patient and family. After consideration of risks, benefits and other options for treatment, the patient has consented to    Procedure(s): RIGHT NECK EXPLORATION WITH PARATHYROIDECTOMY (N/A) as a surgical intervention .    The patient's history has been reviewed, patient examined, no change in status, stable for surgery.  I have reviewed the patient's chart and labs.  Questions were answered to the patient's satisfaction.    Earnstine Regal, MD, [facility] Office: [place]

## 2017-01-04 NOTE — Anesthesia Procedure Notes (Signed)
Procedure Name: Intubation Date/Time: 01/04/2017 9:24 AM Performed by: Deliah Boston Pre-anesthesia Checklist: Patient identified, Emergency Drugs available, Suction available and Patient being monitored Patient Re-evaluated:Patient Re-evaluated prior to inductionOxygen Delivery Method: Circle system utilized Preoxygenation: Pre-oxygenation with 100% oxygen Intubation Type: IV induction Ventilation: Mask ventilation without difficulty Laryngoscope Size: Mac and 3 Grade View: Grade I Tube type: Oral Tube size: 7.0 mm Number of attempts: 1 Airway Equipment and Method: Stylet and Oral airway Placement Confirmation: ETT inserted through vocal cords under direct vision,  positive ETCO2 and breath sounds checked- equal and bilateral Secured at: 22 cm Tube secured with: Tape Dental Injury: Teeth and Oropharynx as per pre-operative assessment

## 2017-01-04 NOTE — Anesthesia Postprocedure Evaluation (Signed)
Anesthesia Post Note  Patient: [name]  Procedure(s) Performed: Procedure(s) (LRB): RIGHT NECK EXPLORATION WITH PARATHYROIDECTOMY (N/A)  Patient location during evaluation: PACU Anesthesia Type: General Level of consciousness: awake and alert, oriented and patient cooperative Pain management: pain level controlled Vital Signs Assessment: post-procedure vital signs reviewed and stable Respiratory status: spontaneous breathing, nonlabored ventilation, respiratory function stable and patient connected to nasal cannula oxygen Cardiovascular status: blood pressure returned to baseline and stable Postop Assessment: no signs of nausea or vomiting Anesthetic complications: no       Last Vitals:  Vitals:   01/04/17 1215 01/04/17 1237  BP: (!) 158/81 (!) 159/79  Pulse: 85   Resp:  16  Temp: 36.4 C 36.7 C    Last Pain:  Vitals:   01/04/17 1215  TempSrc:   PainSc: 6                  Yatzary Merriweather,E. Aloysuis Ribaudo

## 2017-01-05 DIAGNOSIS — E210 Primary hyperparathyroidism: Secondary | ICD-10-CM | POA: Diagnosis not present

## 2017-01-05 LAB — BASIC METABOLIC PANEL
Anion gap: 7 (ref 5–15)
BUN: 18 mg/dL (ref 6–20)
CHLORIDE: 103 mmol/L (ref 101–111)
CO2: 26 mmol/L (ref 22–32)
CREATININE: 1.37 mg/dL — AB (ref 0.44–1.00)
Calcium: 10.1 mg/dL (ref 8.9–10.3)
GFR calc non Af Amer: 36 mL/min — ABNORMAL LOW (ref >60)
GFR, EST AFRICAN AMERICAN: 41 mL/min — AB (ref >60)
Glucose, Bld: 137 mg/dL — ABNORMAL HIGH (ref 65–99)
POTASSIUM: 5.3 mmol/L — AB (ref 3.5–5.1)
Sodium: 136 mmol/L (ref 135–145)

## 2017-01-05 MED ORDER — HYDROCODONE-ACETAMINOPHEN 5-325 MG PO TABS: 1.0000 | ORAL_TABLET | ORAL | 0 refills | Status: DC | PRN

## 2017-01-05 NOTE — Discharge Summary (Signed)
Physician Discharge Summary [facility]  Patient ID: [name] MRN: [id number] DOB/AGE: 80-Jun-1936 80 y.o.  Admit date: 01/04/2017 Discharge date: 01/05/2017  Admission Diagnoses:  Primary hyperparathyroidism, multiple thyroid nodules  Discharge Diagnoses:  Principal Problem:   Hyperparathyroidism, primary ([place]) Active Problems:   Hyperparathyroidism [facility])   Discharged Condition: good  Hospital Course: Patient was admitted for observation following parathyroid and thyroid surgery.  Post op course was uncomplicated.  Pain was well controlled.  Tolerated diet.  Post op calcium level on morning following surgery was 10.1 mg/dl.  Patient was prepared for discharge home on POD#1.  Consults: None  Treatments: surgery: right thyroid lobectomy, neck exploration  Discharge Exam: Blood pressure (!) 112/45, pulse 87, temperature 98.5 F (36.9 C), temperature source Oral, resp. rate 16, height 5\' 3"  (1.6 m), weight 103.9 kg (229 lb), SpO2 100 %. HEENT - clear Neck - wound dry and intact; voice essentially normal, mild raspiness Chest - clear bilaterally Cor - RRR   Disposition: Home  Discharge Instructions    Diet - low sodium heart healthy    Complete by:  As directed    Discharge instructions    Complete by:  As directed    [facility]  THYROID & PARATHYROID SURGERY:  POST-OP INSTRUCTIONS  Always review your discharge instruction sheet from the facility where your surgery was performed.  A prescription for pain medication may be given to you upon discharge.  Take your pain medication as prescribed.  If narcotic pain medicine is not needed, then you may take acetaminophen (Tylenol) or ibuprofen (Advil) as needed.  Take your usually prescribed medications unless otherwise directed.  If you need a refill on your pain medication, please contact our office during regular business hours.  Prescriptions will not be processed by our office  after 5 pm or on weekends.  Start with a light diet upon arrival home, such as soup and crackers or toast.  Be sure to drink plenty of fluids daily.  Resume your normal diet the day after surgery.  Most patients will experience some swelling and bruising on the chest and neck area.  Ice packs will help.  Swelling and bruising can take several days to resolve.   It is common to experience some constipation after surgery.  Increasing fluid intake and taking a stool softener (Colace) will usually help or prevent this problem.  A mild laxative (Milk of Magnesia or Miralax) should be taken according to package directions if there has been no bowel movement after 48 hours.  You have steri-strips and a gauze dressing over your incision.  You may remove the gauze bandage on the second day after surgery, and you may shower at that time.  Leave your steri-strips (small skin tapes) in place directly over the incision.  These strips should remain on the skin for 5-7 days and then be removed.  You may get them wet in the shower and pat them dry.  You may resume regular (light) daily activities beginning the next day - such as daily self-care, walking, climbing stairs - gradually increasing activities as tolerated.  You may have sexual intercourse when it is comfortable.  Refrain from any heavy lifting or straining until approved by your doctor.  You may drive when you no longer are taking prescription pain medication, you can comfortably wear a seatbelt, and you can safely maneuver your car and apply brakes.  You should see your doctor in the office for a follow-up  appointment approximately three weeks after your surgery.  Make sure that you call for this appointment within a day or two after you arrive home to insure a convenient appointment time.  WHEN TO CALL YOUR DOCTOR: -- Fever greater than 101.5 -- Inability to urinate -- Nausea and/or vomiting - persistent -- Extreme swelling or bruising -- Continued  bleeding from incision -- Increased pain, redness, or drainage from the incision -- Difficulty swallowing or breathing -- Muscle cramping or spasms -- Numbness or tingling in hands or around lips  The clinic staff is available to answer your questions during regular business hours.  Please don't hesitate to call and ask to speak to one of the nurses if you have concerns.  Earnstine Regal, MD, [facility] Office: [phone]  Website: [email or web address]   Increase activity slowly    Complete by:  As directed    Remove dressing in 24 hours    Complete by:  As directed      Allergies as of 01/05/2017      Reactions   Codeine Nausea And Vomiting   Iohexol Hives, Other (See Comments)   REACTION: " TROUBLE BREATHING"   Aspirin    In high doses bleeding   Fentanyl    Altered mental state      Medication List    TAKE these medications   calcium-vitamin D 500-200 MG-UNIT tablet Take 1 tablet by mouth every other day.   diphenhydrAMINE 25 MG tablet Commonly known as:  BENADRYL Take 25-50 mg by mouth at bedtime as needed for sleep.   dorzolamide 2 % ophthalmic solution Commonly known as:  TRUSOPT Place 1 drop into both eyes 2 (two) times daily.   esomeprazole 40 MG capsule Commonly known as:  NEXIUM Take 40 mg by mouth daily before breakfast.   fluticasone 50 MCG/ACT nasal spray Commonly known as:  FLONASE Place 2 sprays into both nostrils daily. What changed:  when to take this  reasons to take this   HYDROcodone-acetaminophen 5-325 MG tablet Commonly known as:  NORCO/VICODIN Take 1-2 tablets by mouth every 4 (four) hours as needed for moderate pain.   ibuprofen 200 MG tablet Commonly known as:  ADVIL,MOTRIN Take 400 mg by mouth 2 (two) times daily as needed for headache, mild pain or moderate pain.   losartan 100 MG tablet Commonly known as:  COZAAR TAKE 1 TABLET EACH DAY.   LUMIGAN 0.01 %  Soln Generic drug:  bimatoprost Place 1 drop into both eyes at bedtime. As directed   POLY-IRON 150 150 MG capsule Generic drug:  iron polysaccharides Take 150 mg by mouth daily.   RECLAST 5 MG/100ML Soln injection Generic drug:  zoledronic acid Inject 5 mg into the vein yearly. (Start June 2015)   SALONPAS GEL EX Apply 1 application topically at bedtime as needed (pain).   STOOL SOFTENER 100 MG capsule Generic drug:  docusate sodium Take 100 mg by mouth daily as needed for mild constipation.   traMADol 50 MG tablet Commonly known as:  ULTRAM Take 50 mg by mouth at bedtime.   triamterene-hydrochlorothiazide 37.5-25 MG tablet Commonly known as:  MAXZIDE-25 Take 1 tablet by mouth daily.        Earnstine Regal, MD, [facility] Office: [phone]   Signed: Earnstine Regal 01/05/2017, 8:38 AM

## 2017-01-05 NOTE — Progress Notes (Signed)
Discharge instructions reviewed with patient and husband. Patient verbalized understanding. Patient to be discharged via private vehicle.

## 2017-01-05 NOTE — Op Note (Signed)
[name], [name] NO.:  [id number]  MEDICAL RECORD NO.:  [id number]  LOCATION:                                FACILITY:  [facility]  PHYSICIAN:  Earnstine Regal, MD      DATE OF BIRTH:  1935/06/22  DATE OF PROCEDURE:  01/04/2017                              OPERATIVE REPORT   PREOPERATIVE DIAGNOSIS:  Recurrent primary hyperparathyroidism.  POSTOPERATIVE DIAGNOSIS:  Recurrent primary hyperparathyroidism.  PROCEDURES: 1. Right neck exploration. 2. Multiple right thyroid biopsies. 3. Right thyroid lobectomy.  SURGEON:  Earnstine Regal, M.D.  ANESTHESIA:  General.  ESTIMATED BLOOD LOSS:  Minimal.  PREPARATION:  ChloraPrep.  COMPLICATIONS:  None.  INDICATIONS:  The patient is a 82 year old female who had previously undergone neck exploration with removal of a right inferior parathyroid gland.  She had also undergone excision of a left superior parathyroid gland and biopsy of a left inferior parathyroid gland.  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  The patient does have multiple thyroid nodules.  She now comes to Surgery for neck exploration and parathyroidectomy.  DESCRIPTION OF PROCEDURE:  Procedure was done in OR #5 at the [facility].  The patient was brought to the operating room, placed in supine position on the operating room table.  Following administration of general anesthesia, the patient was positioned and then prepped and draped in the usual aseptic fashion.  After ascertaining that an adequate level of anesthesia had been achieved, the previous anterior cervical incision was reopened on the right side with a #15 blade.  An incision was extended slightly laterally.  Dissection was carried through subcutaneous tissues and platysma.  Skin flaps were elevated circumferentially and a Weitlaner retractor placed for exposure.  Strap muscles were incised in the midline.  Thyroid  gland was identified and strap muscles were reflected to the right, exposing the right thyroid lobe.  Exploration reveals a nodular mass on the surface of the thyroid gland.  This was excised with the electrocautery used for hemostasis.  It was submitted to Pathology.  Dr. Claudette Laws in Pathology confirms thyroid tissue.  Further exploration reveals a second nodule of thyroid tissue extending posteriorly at the level of the tubercle of Zuckerkandl.  It was gently dissected out, taking care to avoid the adjacent recurrent laryngeal nerve.  It was excised and submitted to Pathology.  Dr. Saralyn Pilar felt as though there may be a small area of parathyroid tissue, but the majority of the specimen represented thyroid tissue.  Further dissection allows for complete mobilization of the superior pole of the right lobe of the thyroid.  No obvious adenoma was identified, although there was a nodule palpable in the superior pole.  Therefore, the superior pole was amputated using the electrocautery for hemostasis. The superior pole was sectioned on the table and there is approximately a 5 to 6 mm rounded nodule in the superior pole.  This was marked with a suture and submitted for frozen section.  Dr. Saralyn Pilar again confirmed the presence of thyroid tissue.  Decision was made to proceed with right thyroid lobectomy.  Right lobe was mobilized anteriorly.  Branches of the inferior thyroid artery were divided between small Ligaclips.  Recurrent laryngeal nerve was preserved along its course.  Ligament of Gwenlyn Found was released with the electrocautery.  Inferior venous tributaries were divided between medium Ligaclips.  Gland was dissected off the trachea and mobilized across the midline.  Isthmus was transected at its junction with the left thyroid lobe with the electrocautery.  The right lobe was submitted to Pathology for permanent review.  Neck was irrigated with warm saline and good hemostasis was  noted throughout the operative field.  Fibrillar was placed throughout the operative field.  Strap muscles were reapproximated in the midline with interrupted 3-0 Vicryl sutures.  Platysma was closed with interrupted 3- 0 Vicryl sutures.  Skin was anesthetized with local anesthetic.  Skin edges were reapproximated with a running 4-0 Monocryl subcuticular suture.  Wound was washed and dried and Steri-Strips were applied. Sterile dressings were applied.  The patient was awakened from anesthesia and brought to the recovery room.  The patient tolerated the procedure well.   Earnstine Regal, MD, [facility] Office: [phone]   TMG/MEDQ  D:  01/04/2017  T:  01/04/2017  Job:  [id number]

## 2017-04-30 ENCOUNTER — Other Ambulatory Visit (HOSPITAL_COMMUNITY): Payer: Self-pay | Admitting: *Deleted

## 2017-05-01 ENCOUNTER — Ambulatory Visit (HOSPITAL_COMMUNITY): Admission: RE | Admit: 2017-05-01 | Payer: Medicare Other | Source: Ambulatory Visit

## 2017-05-15 ENCOUNTER — Other Ambulatory Visit: Payer: Self-pay | Admitting: Surgery

## 2017-05-15 DIAGNOSIS — E042 Nontoxic multinodular goiter: Secondary | ICD-10-CM

## 2017-05-18 ENCOUNTER — Ambulatory Visit
Admission: RE | Admit: 2017-05-18 | Discharge: 2017-05-18 | Disposition: A | Discharge status: Home / Self Care | Payer: Medicare Other | Source: Ambulatory Visit | Attending: Surgery | Admitting: Surgery

## 2017-05-18 DIAGNOSIS — E042 Nontoxic multinodular goiter: Secondary | ICD-10-CM

## 2017-09-18 ENCOUNTER — Institutional Professional Consult (permissible substitution): Payer: Medicare Other | Admitting: Pulmonary Disease

## 2017-10-16 ENCOUNTER — Encounter: Payer: Self-pay | Admitting: Pulmonary Disease

## 2017-10-16 ENCOUNTER — Ambulatory Visit: Payer: Medicare Other | Admitting: Pulmonary Disease

## 2017-10-16 VITALS — BP 120/78 | HR 63 | Ht 63.0 in | Wt 243.6 lb

## 2017-10-16 DIAGNOSIS — Z6841 Body Mass Index (BMI) 40.0 and over, adult: Secondary | ICD-10-CM | POA: Diagnosis not present

## 2017-10-16 DIAGNOSIS — R29818 Other symptoms and signs involving the nervous system: Secondary | ICD-10-CM | POA: Diagnosis not present

## 2017-10-16 DIAGNOSIS — R05 Cough: Secondary | ICD-10-CM

## 2017-10-16 DIAGNOSIS — R058 Other specified cough: Secondary | ICD-10-CM

## 2017-10-16 NOTE — Progress Notes (Signed)
[facility]  Chief Complaint  Patient presents with  . sleep consult    Pt referred by Dr. Burnard Bunting MD. Pt's husband states pt snores loudly, gasps for air, and stops breathing several times during the night. Pt has some SOB with exertion, productive cough-yellow. Pt takes several naps during the day she states it is due to medication change.    Vital signs: BP 120/78 (BP Location: Left Arm, Cuff Size: Normal)   Pulse 63   Ht 5\' 3"  (1.6 m)   Wt 243 lb 9.6 oz (110.5 kg)   SpO2 94%   BMI 43.15 kg/m   History of Present Illness: [name] is a 83 y.o. female for evaluation of sleep problems.  Her husband has been concerned about her snoring.  This has been getting worse.  She falls asleep all the time during the day.  She will also stop breathing.  She has noticed more trouble with depression.  She gets intermittent cough and globus sensation when her allergies and sinuses flare up.   She goes to sleep at 12 am.  She falls asleep after 20 minutes.  She wakes up 2 to 3 times to use the bathroom.  She gets out of bed at 10 am.  She feels tired in the morning.  She denies morning headache.  She has been using trazodone for the past two months.  This helps her fall asleep, but she still wakes up frequently to use the bathroom or because she can't catch her breath.  She has trouble sleeping on her back.  Her mouth gets dry at night.  She drinks coffee and tea in the morning.  She denies sleep walking, sleep talking, bruxism, or nightmares.  There is no history of restless legs.  She denies sleep hallucinations, sleep paralysis, or cataplexy.  The Epworth score is 16 out of 24.    Physical Exam:  General - pleasant Eyes - pupils reactive ENT - no sinus tenderness, no oral exudate, no LAN, MP 3, decreased AP diameter, low laying soft palate Cardiac - regular, no murmur Chest - no wheeze, rales Abd - soft, non tender Ext - no edema Skin  - no rashes Neuro - normal strength Psych - normal mood  Discussion: She has snoring, sleep disruption, witnessed apnea, and daytime sleepiness.  She has history of refractory hypertension and depression.  I am concerned she could have sleep apnea.  We discussed how sleep apnea can affect various health problems, including risks for hypertension, cardiovascular disease, and diabetes.  We also discussed how sleep disruption can increase risks for accidents, such as while driving.  Weight loss as a means of improving sleep apnea was also reviewed.  Additional treatment options discussed were CPAP therapy, oral appliance, and surgical intervention.  She also has symptoms consistent with upper airway cough syndrome.  Assessment/Plan:  Suspected sleep apnea. - will arrange for home sleep study  Morbid obesity. - discussed the importance of weight loss  Upper airway cough syndrome. - improved recently - can use nasal irrigation, nasal steroids prn   Patient Instructions  Will arrange for home sleep study Will call to arrange for follow up after sleep study reviewed     Chesley Mires, MD [place] 10/16/2017, 10:24 AM Pager:  [phone]  Flow Sheet  Pulmonary tests: CT chest 09/23/15 >> multiple pulmonary nodules stable since 2011, mild cylindrical BTX lower lobes, atherosclerosis PFT 12/17/15 >> FEV1 2.06 (116%), FEV1% 86,  TLC 3.90 (83%), DLCO 69%  Sleep tests:  Cardiac tests: Echo 08/05/15 >> EF 60 to 65%  Review of Systems: Constitutional: Positive for appetite change, fatigue and unexpected weight change. Negative for fever.  HENT: Positive for congestion, postnasal drip, sinus pressure and sneezing. Negative for dental problem, ear pain, nosebleeds, rhinorrhea, sore throat and trouble swallowing.   Eyes: Negative for redness and itching.  Respiratory: Positive for cough and shortness of breath. Negative for chest tightness and wheezing.     Cardiovascular: Positive for leg swelling. Negative for palpitations.  Gastrointestinal: Negative for nausea and vomiting.  Genitourinary: Negative for dysuria.  Musculoskeletal: Negative for joint swelling.  Skin: Negative for rash.  Allergic/Immunologic: Positive for environmental allergies. Negative for food allergies and immunocompromised state.  Neurological: Negative for headaches.  Hematological: Does not bruise/bleed easily.  Psychiatric/Behavioral: Negative for dysphoric mood. The patient is nervous/anxious.    Past Medical History: She  has a past medical history of Bronchiectasis ([place]) (10/01/2015), Diverticulosis of colon (without mention of hemorrhage) (07/17/2007), Gastritis (07/17/2007), GERD (gastroesophageal reflux disease), Glaucoma, History of fatty infiltration of liver, Hyperlipemia, Hyperparathyroidism ([place]), Hypertension, Lumbosacral root lesions, not elsewhere classified (04/13/2014), Obese, Osteoarthritis, Osteoporosis, PONV (postoperative nausea and vomiting), and Renal calculus, right.  Past Surgical History: She  has a past surgical history that includes Colonoscopy (2008); Esophagogastroduodenoscopy (2008); Lumbar fusion (2008); Knee Arthroplasty (2004); Cholecystectomy (2000); Rotator cuff repair (2000); Breast biopsy (1988); Cesarean section (1974); Appendectomy (1974); Tubal ligation (1974); Cesarean section (1971); Parathyroidectomy (2009); Eye surgery; and Parathyroidectomy (N/A, 01/04/2017).  Family History: Her family history includes Colon polyps in her mother; Diabetes in her father; Hernia in her father; Hypertension in her mother; Liver disease in her paternal grandmother; Osteoarthritis in her maternal grandmother and mother; Stroke in her father, maternal grandfather, and paternal grandfather.  Social History: She  reports that she quit smoking about 3 years ago. Her smoking use included cigarettes. She has a 3.75 pack-year smoking history. she has never  used smokeless tobacco. She reports that she drinks about 0.6 oz of alcohol per week. She reports that she does not use drugs.  Medications: Allergies as of 10/16/2017      Reactions   Codeine Nausea And Vomiting   Iohexol Hives, Other (See Comments)   REACTION: " TROUBLE BREATHING"   Aspirin    In high doses bleeding   Fentanyl    Altered mental state      Medication List        Accurate as of 10/16/17 10:24 AM. Always use your most recent med list.          calcium-vitamin D 500-200 MG-UNIT tablet Take 1 tablet by mouth every other day.   dorzolamide 2 % ophthalmic solution Commonly known as:  TRUSOPT Place 1 drop into both eyes 2 (two) times daily.   DULoxetine 30 MG capsule Commonly known as:  CYMBALTA Take 30 mg by mouth daily.   esomeprazole 40 MG capsule Commonly known as:  NEXIUM Take 40 mg by mouth daily before breakfast.   fluticasone 50 MCG/ACT nasal spray Commonly known as:  FLONASE Place 2 sprays into both nostrils daily.   ibuprofen 200 MG tablet Commonly known as:  ADVIL,MOTRIN Take 400 mg by mouth 2 (two) times daily as needed for headache, mild pain or moderate pain.   losartan 100 MG tablet Commonly known as:  COZAAR TAKE 1 TABLET EACH DAY.   LUMIGAN 0.01 % Soln Generic drug:  bimatoprost Place 1 drop into both eyes at  bedtime. As directed   POLY-IRON 150 150 MG capsule Generic drug:  iron polysaccharides Take 150 mg by mouth daily.   RECLAST 5 MG/100ML Soln injection Generic drug:  zoledronic acid Inject 5 mg into the vein yearly. (Start June 2015)   STOOL SOFTENER 100 MG capsule Generic drug:  docusate sodium Take 100 mg by mouth daily as needed for mild constipation.   triamterene-hydrochlorothiazide 37.5-25 MG tablet Commonly known as:  MAXZIDE-25 Take 1 tablet by mouth daily.

## 2017-10-16 NOTE — Progress Notes (Signed)
   Subjective:    Patient ID: [name], female    DOB: Feb 20, 1935, 83 y.o.   MRN: [id number]  HPI    Review of Systems  Constitutional: Positive for appetite change, fatigue and unexpected weight change. Negative for fever.  HENT: Positive for congestion, postnasal drip, sinus pressure and sneezing. Negative for dental problem, ear pain, nosebleeds, rhinorrhea, sore throat and trouble swallowing.   Eyes: Negative for redness and itching.  Respiratory: Positive for cough and shortness of breath. Negative for chest tightness and wheezing.   Cardiovascular: Positive for leg swelling. Negative for palpitations.  Gastrointestinal: Negative for nausea and vomiting.  Genitourinary: Negative for dysuria.  Musculoskeletal: Negative for joint swelling.  Skin: Negative for rash.  Allergic/Immunologic: Positive for environmental allergies. Negative for food allergies and immunocompromised state.  Neurological: Negative for headaches.  Hematological: Does not bruise/bleed easily.  Psychiatric/Behavioral: Negative for dysphoric mood. The patient is nervous/anxious.        Objective:   Physical Exam        Assessment & Plan:

## 2017-10-16 NOTE — Patient Instructions (Signed)
Will arrange for home sleep study Will call to arrange for follow up after sleep study reviewed  

## 2017-11-08 DIAGNOSIS — G4733 Obstructive sleep apnea (adult) (pediatric): Secondary | ICD-10-CM | POA: Diagnosis not present

## 2017-11-10 ENCOUNTER — Telehealth: Payer: Self-pay | Admitting: Pulmonary Disease

## 2017-11-10 DIAGNOSIS — G4733 Obstructive sleep apnea (adult) (pediatric): Secondary | ICD-10-CM | POA: Diagnosis not present

## 2017-11-10 NOTE — Telephone Encounter (Signed)
HST 11/08/17 >> AHI 74.9, SaO2 low 68%.   Will have my nurse inform pt that sleep study shows severe sleep apnea.  Options are 1) CPAP now, 2) ROV first.  If She is agreeable to CPAP, then please send order for auto CPAP range 5 to 15 cm H2O with heated humidity and mask of choice.  Have download sent 1 month after starting CPAP and set up ROV 2 months after starting CPAP.  ROV can be with me or NP.

## 2017-11-12 NOTE — Telephone Encounter (Signed)
Spoke with pt. She is aware of her sleep study results. Pt would rather come in for an OV. She has been scheduled to see VS on 11/27/2017 at 11:30am. Nothing further was needed at this time.

## 2017-11-15 ENCOUNTER — Other Ambulatory Visit: Payer: Self-pay | Admitting: Neurology

## 2017-11-15 ENCOUNTER — Other Ambulatory Visit: Payer: Self-pay | Admitting: *Deleted

## 2017-11-15 DIAGNOSIS — R29818 Other symptoms and signs involving the nervous system: Secondary | ICD-10-CM

## 2017-11-15 DIAGNOSIS — R413 Other amnesia: Secondary | ICD-10-CM

## 2017-11-27 ENCOUNTER — Ambulatory Visit: Payer: Medicare Other | Admitting: Pulmonary Disease

## 2017-11-27 ENCOUNTER — Encounter: Payer: Self-pay | Admitting: Pulmonary Disease

## 2017-11-27 VITALS — BP 110/76 | HR 83 | Ht 63.0 in | Wt 241.6 lb

## 2017-11-27 DIAGNOSIS — G4733 Obstructive sleep apnea (adult) (pediatric): Secondary | ICD-10-CM | POA: Diagnosis not present

## 2017-11-27 DIAGNOSIS — R058 Other specified cough: Secondary | ICD-10-CM

## 2017-11-27 DIAGNOSIS — Z6841 Body Mass Index (BMI) 40.0 and over, adult: Secondary | ICD-10-CM

## 2017-11-27 DIAGNOSIS — R05 Cough: Secondary | ICD-10-CM | POA: Diagnosis not present

## 2017-11-27 DIAGNOSIS — Z7189 Other specified counseling: Secondary | ICD-10-CM

## 2017-11-27 NOTE — Patient Instructions (Signed)
Will arrange for CPAP set up  Follow up in 2 months after CPAP set up 

## 2017-11-27 NOTE — Progress Notes (Signed)
[facility]  Chief Complaint  Patient presents with  . Follow-up    pt would like to further discuss home sleep study. Pt and Pt's husband has had a head cold in last 7 days    Vital signs: BP 110/76 (BP Location: Left Wrist, Cuff Size: Normal)   Pulse 83   Ht 5\' 3"  (1.6 m)   Wt 241 lb 9.6 oz (109.6 kg)   SpO2 99%   BMI 42.80 kg/m   History of Present Illness: [name] is a 83 y.o. female with obstructive sleep apnea.  She is here to review her home sleep study.  This showed severe sleep apnea.  She had several questions about CPAP set up.  She is concerned about wearing a mask at night.  Physical Exam:  General - pleasant Eyes - pupils reactive ENT - no sinus tenderness, no oral exudate, no LAN, MP 3, decreased AP diameter, low laying soft palate Cardiac - regular, no murmur Chest - no wheeze, rales Abd - soft, non tender Ext - no edema Skin - no rashes Neuro - normal strength Psych - normal mood  Assessment/Plan:  Obstructive sleep apnea. - We discussed how sleep apnea can affect various health problems, including risks for hypertension, cardiovascular disease, and diabetes.  We also discussed how sleep disruption can increase risks for accidents, such as while driving.  Weight loss as a means of improving sleep apnea was also reviewed.  Additional treatment options discussed were CPAP therapy, oral appliance, and surgical intervention. - will arrange for auto CPAP set up  Morbid obesity. - discussed importance of weight loss  Upper airway cough syndrome. - prn nasal irrigation, and nasal steroid - explained how some of her nocturnal cough could be related to her sleep apnea   Patient Instructions  Will arrange for CPAP set up  Follow up in 2 months after CPAP set up   Chesley Mires, MD [place] 11/27/2017, 11:54 AM Pager:  [phone]  Flow Sheet  Pulmonary tests: CT chest 09/23/15  >> multiple pulmonary nodules stable since 2011, mild cylindrical BTX lower lobes, atherosclerosis PFT 12/17/15 >> FEV1 2.06 (116%), FEV1% 86, TLC 3.90 (83%), DLCO 69%  Sleep tests: HST 11/08/17 >> AHI 74.9, SaO2 low 68%.  Cardiac tests: Echo 08/05/15 >> EF 60 to 65%  Past Medical History: She  has a past medical history of Bronchiectasis ([place]) (10/01/2015), Diverticulosis of colon (without mention of hemorrhage) (07/17/2007), Gastritis (07/17/2007), GERD (gastroesophageal reflux disease), Glaucoma, History of fatty infiltration of liver, Hyperlipemia, Hyperparathyroidism ([place]), Hypertension, Lumbosacral root lesions, not elsewhere classified (04/13/2014), Obese, Osteoarthritis, Osteoporosis, PONV (postoperative nausea and vomiting), and Renal calculus, right.  Past Surgical History: She  has a past surgical history that includes Colonoscopy (2008); Esophagogastroduodenoscopy (2008); Lumbar fusion (2008); Knee Arthroplasty (2004); Cholecystectomy (2000); Rotator cuff repair (2000); Breast biopsy (1988); Cesarean section (1974); Appendectomy (1974); Tubal ligation (1974); Cesarean section (1971); Parathyroidectomy (2009); Eye surgery; and Parathyroidectomy (N/A, 01/04/2017).  Family History: Her family history includes Colon polyps in her mother; Diabetes in her father; Hernia in her father; Hypertension in her mother; Liver disease in her paternal grandmother; Osteoarthritis in her maternal grandmother and mother; Stroke in her father, maternal grandfather, and paternal grandfather.  Social History: She  reports that she quit smoking about 3 years ago. Her smoking use included cigarettes. She has a 3.75 pack-year smoking history. she has never used smokeless tobacco. She reports that she drinks about 0.6 oz of alcohol  per week. She reports that she does not use drugs.  Medications: Allergies as of 11/27/2017      Reactions   Codeine Nausea And Vomiting   Iohexol Hives, Other (See Comments)    REACTION: " TROUBLE BREATHING"   Aspirin    In high doses bleeding   Fentanyl    Altered mental state      Medication List        Accurate as of 11/27/17 11:54 AM. Always use your most recent med list.          calcium-vitamin D 500-200 MG-UNIT tablet Take 1 tablet by mouth every other day.   dorzolamide 2 % ophthalmic solution Commonly known as:  TRUSOPT Place 1 drop into both eyes 2 (two) times daily.   DULoxetine 30 MG capsule Commonly known as:  CYMBALTA Take 30 mg by mouth daily.   esomeprazole 40 MG capsule Commonly known as:  NEXIUM Take 40 mg by mouth daily before breakfast.   fluticasone 50 MCG/ACT nasal spray Commonly known as:  FLONASE Place 2 sprays into both nostrils daily.   ibuprofen 200 MG tablet Commonly known as:  ADVIL,MOTRIN Take 400 mg by mouth 2 (two) times daily as needed for headache, mild pain or moderate pain.   losartan 100 MG tablet Commonly known as:  COZAAR TAKE 1 TABLET EACH DAY.   LUMIGAN 0.01 % Soln Generic drug:  bimatoprost Place 1 drop into both eyes at bedtime. As directed   POLY-IRON 150 150 MG capsule Generic drug:  iron polysaccharides Take 150 mg by mouth daily.   RECLAST 5 MG/100ML Soln injection Generic drug:  zoledronic acid Inject 5 mg into the vein yearly. (Start June 2015)   STOOL SOFTENER 100 MG capsule Generic drug:  docusate sodium Take 100 mg by mouth daily as needed for mild constipation.   triamterene-hydrochlorothiazide 37.5-25 MG tablet Commonly known as:  MAXZIDE-25 Take 1 tablet by mouth daily.

## 2017-11-28 ENCOUNTER — Ambulatory Visit
Admission: RE | Admit: 2017-11-28 | Discharge: 2017-11-28 | Disposition: A | Discharge status: Home / Self Care | Payer: Medicare Other | Source: Ambulatory Visit | Attending: Neurology | Admitting: Neurology

## 2017-11-28 DIAGNOSIS — R413 Other amnesia: Secondary | ICD-10-CM

## 2017-12-18 ENCOUNTER — Other Ambulatory Visit (HOSPITAL_COMMUNITY): Payer: Self-pay | Admitting: Internal Medicine

## 2017-12-18 DIAGNOSIS — G459 Transient cerebral ischemic attack, unspecified: Secondary | ICD-10-CM

## 2017-12-19 ENCOUNTER — Ambulatory Visit (HOSPITAL_COMMUNITY)
Admission: RE | Admit: 2017-12-19 | Discharge: 2017-12-19 | Disposition: A | Discharge status: Home / Self Care | Payer: Medicare Other | Source: Ambulatory Visit | Attending: Vascular Surgery | Admitting: Vascular Surgery

## 2017-12-19 DIAGNOSIS — I6523 Occlusion and stenosis of bilateral carotid arteries: Secondary | ICD-10-CM | POA: Diagnosis not present

## 2017-12-19 DIAGNOSIS — G459 Transient cerebral ischemic attack, unspecified: Secondary | ICD-10-CM | POA: Diagnosis not present

## 2017-12-19 DIAGNOSIS — I69820 Aphasia following other cerebrovascular disease: Secondary | ICD-10-CM | POA: Insufficient documentation

## 2017-12-27 ENCOUNTER — Telehealth: Payer: Self-pay | Admitting: Pulmonary Disease

## 2017-12-27 NOTE — Telephone Encounter (Signed)
Called and spoke to pt. Pt states she is not wearing her CPAP at all, pt tried it for 5 min one night and has not tried it again. Provided pt with information and educated pt on importance of compliance not only for insurance but for her health as well. Pt states she will re-try CPAP. Pt also c/o non prod cough and states she feels like she may have some mucus in chest but unable to expectorate anything. Pt states she feels this is also a hindrance to wearing her CPAP because the cough is constant. Pt states she had the cough at the last OV in 11/2017 but it has progressively gotten worse. Pt has tried OTC cough suppressants but nothing has helped per pt.   Dr. Halford Chessman please advise if anything can be prescribed for cough and if ok to push back pt's f/u OV from 01/29/2018 to 03/04/2018 (first available with [facility]) for pt's CPAP compliance appt. Thanks.

## 2017-12-28 MED ORDER — BENZONATATE 100 MG PO CAPS: 100.0000 mg | ORAL_CAPSULE | Freq: Three times a day (TID) | ORAL | 2 refills | Status: DC | PRN

## 2017-12-28 NOTE — Telephone Encounter (Signed)
Called and spoke with pt's spouse Delfino Lovett letting him know we were going to send an Rx of tessalon in to pt's pharmacy to see if this would help with pt's cough.  Also advised Richard to see if he can get pt to use the CPAP but if pt still was refusing it, we could discuss other options at pt's OV.  Richard expressed understanding and stated to me they were needing to reschedule pt's OV that was currently scheduled with [facility] 01/29/18 due to pt having another appt on that same day at the same time.  I rescheduled pt's appt with TP 01/30/18 at 10:30.  Script for [employer] sent in to pt's pharmacy.  Nothing further needed at this time.

## 2017-12-28 NOTE — Telephone Encounter (Signed)
Can send script for tessalon 100 mg tid prn, #30 with 2 refills.

## 2018-01-29 ENCOUNTER — Ambulatory Visit: Payer: Medicare Other | Admitting: Pulmonary Disease

## 2018-01-30 ENCOUNTER — Ambulatory Visit: Payer: Medicare Other | Admitting: Adult Health

## 2018-01-30 ENCOUNTER — Encounter: Payer: Self-pay | Admitting: Adult Health

## 2018-01-30 DIAGNOSIS — J209 Acute bronchitis, unspecified: Secondary | ICD-10-CM

## 2018-01-30 DIAGNOSIS — G4733 Obstructive sleep apnea (adult) (pediatric): Secondary | ICD-10-CM | POA: Diagnosis not present

## 2018-01-30 MED ORDER — AZITHROMYCIN 250 MG PO TABS: ORAL_TABLET | ORAL | 0 refills | Status: AC

## 2018-01-30 MED ORDER — BENZONATATE 100 MG PO CAPS: 100.0000 mg | ORAL_CAPSULE | Freq: Three times a day (TID) | ORAL | 3 refills | Status: DC | PRN

## 2018-01-30 NOTE — Progress Notes (Signed)
Reviewed and agree with assessment/plan.   Jemmie Rhinehart, MD [facility] 09/27/2016, 12:24 PM Pager:  [phone]  

## 2018-01-30 NOTE — Patient Instructions (Addendum)
Continue on CPAP At bedtime  .  Wear each night for at least 4hr each night.  Work on healthy weight .  Do not drive if sleepy   Zpack take as directed  Mucinex DM Twice daily  As needed  Cough/congestion  Tessalon Three times a day  As needed  Cough.  Begin Zyrtec 10mg  At bedtime  .  Please contact office for sooner follow up if symptoms do not improve or worsen or seek emergency care   Follow up with Dr. Halford Chessman  In 4 months and As needed

## 2018-01-30 NOTE — Assessment & Plan Note (Signed)
Flare   Plan  Zpack and Mucinex DM  Check CXR

## 2018-01-30 NOTE — Addendum Note (Signed)
Addended by: Parke Poisson E on: 01/30/2018 12:46 PM   Modules accepted: Orders

## 2018-01-30 NOTE — Assessment & Plan Note (Signed)
Significant improvement on CPAP  encouarged on compliance   Plan  Patient Instructions  Continue on CPAP At bedtime  .  Wear each night for at least 4hr each night.  Work on healthy weight .  Do not drive if sleepy   Zpack take as directed  Mucinex DM Twice daily  As needed  Cough/congestion  Tessalon Three times a day  As needed  Cough.  Begin Zyrtec 10mg  At bedtime  .  Please contact office for sooner follow up if symptoms do not improve or worsen or seek emergency care   Follow up with Dr. Halford Chessman  In 4 months and As needed

## 2018-01-30 NOTE — Progress Notes (Signed)
@Patient  ID: [name], female    DOB: 1935-07-19, 83 y.o.   MRN: [id number]  Chief Complaint  Patient presents with  . Follow-up    OSA     Referring provider: Burnard Bunting, MD  HPI: 83 yo female followed for OSA and Bronchiectasis   Pulmonary tests: CT chest 09/23/15 >> multiple pulmonary nodules stable since 2011, mild cylindrical BTX lower lobes, atherosclerosis PFT 12/17/15 >> FEV1 2.06 (116%), FEV1% 86, TLC 3.90 (83%), DLCO 69%  Sleep tests: HST 11/08/17 >> AHI 74.9, SaO2 low 68%.  Cardiac tests: Echo 08/05/15 >> EF 60 to 65%  01/30/2018 Follow up : OSA  Patient presents for a follow-up for sleep apnea.  She was recently started on CPAP for severe OSA . She says she is trying to wear her CPAP more.  Feels like that she is starting to get more used to it.  Download shows good compliance with 73% usage.  For average of 5 hours.  Patient is on AutoSet 5 to 15 cm H2O.  AHI 2.5.  Positive leaks. Decreased daytime sleepiness.  Feels more active .   Patient complains over the last 6 to 8 weeks that she has had a increased cough.  Over the last couple weeks is been more productive with thick yellow mucus.  She also has postnasal drainage and throat clearing.  She denies any hemoptysis chest pain orthopnea or PND.   Allergies  Allergen Reactions  . Codeine Nausea And Vomiting  . Iohexol Hives and Other (See Comments)    REACTION: " TROUBLE BREATHING"  . Aspirin     In high doses bleeding  . Fentanyl     Altered mental state    Immunization History  Administered Date(s) Administered  . Influenza Split 10/07/2015, 06/16/2017  . Pneumococcal Conjugate-13 11/02/2017    Past Medical History:  Diagnosis Date  . Bronchiectasis ([place]) 10/01/2015  . Diverticulosis of colon (without mention of hemorrhage) 07/17/2007  . Gastritis 07/17/2007  . GERD (gastroesophageal reflux disease)   . Glaucoma   . History of fatty infiltration of liver   . Hyperlipemia   .  Hyperparathyroidism ([place])   . Hypertension   . Lumbosacral root lesions, not elsewhere classified 04/13/2014  . Obese   . Osteoarthritis   . Osteoporosis   . PONV (postoperative nausea and vomiting)    Not in last several surgeries  . Renal calculus, right    severe    Tobacco History: Social History   Tobacco Use  Smoking Status Former Smoker  . Packs/day: 0.25  . Years: 15.00  . Pack years: 3.75  . Types: Cigarettes  . Last attempt to quit: 09/01/2014  . Years since quitting: 3.4  Smokeless Tobacco Never Used   Counseling given: Not Answered   Outpatient Encounter Medications as of 01/30/2018  Medication Sig  . benzonatate (TESSALON) 100 MG capsule Take 1 capsule (100 mg total) by mouth 3 (three) times daily as needed for cough.  . Calcium Carbonate-Vitamin D (CALCIUM-VITAMIN D) 500-200 MG-UNIT per tablet Take 1 tablet by mouth every other day.   . docusate sodium (STOOL SOFTENER) 100 MG capsule Take 100 mg by mouth daily as needed for mild constipation.  . dorzolamide (TRUSOPT) 2 % ophthalmic solution Place 1 drop into both eyes 2 (two) times daily.   . DULoxetine (CYMBALTA) 30 MG capsule Take 30 mg by mouth daily.  Marland Kitchen esomeprazole (NEXIUM) 40 MG capsule Take 40 mg by mouth daily before breakfast.   . fluticasone (  FLONASE) 50 MCG/ACT nasal spray Place 2 sprays into both nostrils daily. (Patient taking differently: Place 2 sprays into both nostrils at bedtime as needed for allergies. )  . ibuprofen (ADVIL,MOTRIN) 200 MG tablet Take 400 mg by mouth 2 (two) times daily as needed for headache, mild pain or moderate pain.   Marland Kitchen losartan (COZAAR) 100 MG tablet TAKE 1 TABLET EACH DAY.  Marland Kitchen LUMIGAN 0.01 % SOLN Place 1 drop into both eyes at bedtime. As directed  . POLY-IRON 150 150 MG capsule Take 150 mg by mouth daily.   Marland Kitchen triamterene-hydrochlorothiazide (MAXZIDE-25) 37.5-25 MG tablet Take 1 tablet by mouth daily.  . zoledronic acid (RECLAST) 5 MG/100ML SOLN injection Inject 5 mg into  the vein yearly. [facility] June 2015)  . azithromycin (ZITHROMAX Z-PAK) 250 MG tablet Take 2 tablets (500 mg) on  Day 1,  followed by 1 tablet (250 mg) once daily on Days 2 through 5.   No facility-administered encounter medications on file as of 01/30/2018.      Review of Systems  Constitutional:   No  weight loss, night sweats,  Fevers, chills, fatigue, or  lassitude.  HEENT:   No headaches,  Difficulty swallowing,  Tooth/dental problems, or  Sore throat,                No sneezing, itching, ear ache, + nasal congestion, post nasal drip,   CV:  No chest pain,  Orthopnea, PND, swelling in lower extremities, anasarca, dizziness, palpitations, syncope.   GI  No heartburn, indigestion, abdominal pain, nausea, vomiting, diarrhea, change in bowel habits, loss of appetite, bloody stools.   Resp:    No chest wall deformity  Skin: no rash or lesions.  GU: no dysuria, change in color of urine, no urgency or frequency.  No flank pain, no hematuria   MS:  No joint pain or swelling.  No decreased range of motion.  No back pain.    Physical Exam  BP 122/82 (BP Location: Right Wrist, Cuff Size: Normal)   Pulse 78   Ht 5\' 3"  (1.6 m)   Wt 250 lb 9.6 oz (113.7 kg)   SpO2 98%   BMI 44.39 kg/m   GEN: A/Ox3; pleasant , NAD, elderly    HEENT:  [place]/AT,  EACs-clear, TMs-wnl, NOSE-clear, THROAT-clear, no lesions, no postnasal drip or exudate noted.   NECK:  Supple w/ fair ROM; no JVD; normal carotid impulses w/o bruits; no thyromegaly or nodules palpated; no lymphadenopathy.    RESP  Clear  P & A; w/o, wheezes/ rales/ or rhonchi. no accessory muscle use, no dullness to percussion  CARD:  RRR, no m/r/g, no peripheral edema, pulses intact, no cyanosis or clubbing.  GI:   Soft & nt; nml bowel sounds; no organomegaly or masses detected.   Musco: Warm bil, no deformities or joint swelling noted.   Neuro: alert, no focal deficits noted.    Skin: Warm, no lesions or rashes    Lab  Results:  BNP  ProBNP No results found for: PROBNP  Imaging: No results found.   Assessment & Plan:   OSA (obstructive sleep apnea) Significant improvement on CPAP  encouarged on compliance   Plan  Patient Instructions  Continue on CPAP At bedtime  .  Wear each night for at least 4hr each night.  Work on healthy weight .  Do not drive if sleepy   Zpack take as directed  Mucinex DM Twice daily  As needed  Cough/congestion  Tessalon Three times  a day  As needed  Cough.  Begin Zyrtec 10mg  At bedtime  .  Please contact office for sooner follow up if symptoms do not improve or worsen or seek emergency care   Follow up with Dr. Halford Chessman  In 4 months and As needed         Acute bronchitis Flare   Plan  Zpack and Mucinex DM  Check CXR      Rexene Edison, NP 01/30/2018

## 2018-05-15 ENCOUNTER — Other Ambulatory Visit (HOSPITAL_COMMUNITY): Payer: Self-pay

## 2018-05-16 ENCOUNTER — Ambulatory Visit (HOSPITAL_COMMUNITY)
Admission: RE | Admit: 2018-05-16 | Discharge: 2018-05-16 | Disposition: A | Discharge status: Home / Self Care | Payer: Medicare Other | Source: Ambulatory Visit | Attending: Internal Medicine | Admitting: Internal Medicine

## 2018-05-16 DIAGNOSIS — M810 Age-related osteoporosis without current pathological fracture: Secondary | ICD-10-CM | POA: Diagnosis not present

## 2018-05-16 MED ORDER — ZOLEDRONIC ACID 5 MG/100ML IV SOLN
INTRAVENOUS | Status: AC
  Filled 2018-05-16: qty 100

## 2018-05-16 MED ORDER — ZOLEDRONIC ACID 5 MG/100ML IV SOLN
5.0000 mg | Freq: Once | INTRAVENOUS | Status: AC
  Administered 2018-05-16: 13:00:00 5 mg via INTRAVENOUS

## 2018-06-27 ENCOUNTER — Other Ambulatory Visit: Payer: Self-pay | Admitting: General Surgery

## 2018-06-27 DIAGNOSIS — E042 Nontoxic multinodular goiter: Secondary | ICD-10-CM

## 2018-07-03 ENCOUNTER — Ambulatory Visit
Admission: RE | Admit: 2018-07-03 | Discharge: 2018-07-03 | Disposition: A | Discharge status: Home / Self Care | Payer: Medicare Other | Source: Ambulatory Visit | Attending: General Surgery | Admitting: General Surgery

## 2018-07-03 DIAGNOSIS — E042 Nontoxic multinodular goiter: Secondary | ICD-10-CM

## 2019-01-28 ENCOUNTER — Emergency Department (HOSPITAL_COMMUNITY): Payer: Medicare Other

## 2019-01-28 ENCOUNTER — Other Ambulatory Visit: Payer: Self-pay

## 2019-01-28 ENCOUNTER — Encounter (HOSPITAL_COMMUNITY): Payer: Self-pay

## 2019-01-28 ENCOUNTER — Inpatient Hospital Stay (HOSPITAL_COMMUNITY)
Admission: EM | Admit: 2019-01-28 | Discharge: 2019-01-30 | DRG: 392 | Disposition: A | Discharge status: Home / Self Care | Payer: Medicare Other | Attending: Family Medicine | Admitting: Family Medicine

## 2019-01-28 DIAGNOSIS — E039 Hypothyroidism, unspecified: Secondary | ICD-10-CM | POA: Diagnosis present

## 2019-01-28 DIAGNOSIS — Z8371 Family history of colonic polyps: Secondary | ICD-10-CM | POA: Diagnosis not present

## 2019-01-28 DIAGNOSIS — E785 Hyperlipidemia, unspecified: Secondary | ICD-10-CM | POA: Diagnosis present

## 2019-01-28 DIAGNOSIS — G8929 Other chronic pain: Secondary | ICD-10-CM | POA: Diagnosis present

## 2019-01-28 DIAGNOSIS — Z823 Family history of stroke: Secondary | ICD-10-CM

## 2019-01-28 DIAGNOSIS — D500 Iron deficiency anemia secondary to blood loss (chronic): Secondary | ICD-10-CM | POA: Diagnosis not present

## 2019-01-28 DIAGNOSIS — E860 Dehydration: Secondary | ICD-10-CM

## 2019-01-28 DIAGNOSIS — D649 Anemia, unspecified: Secondary | ICD-10-CM | POA: Diagnosis not present

## 2019-01-28 DIAGNOSIS — Z79890 Hormone replacement therapy (postmenopausal): Secondary | ICD-10-CM

## 2019-01-28 DIAGNOSIS — M810 Age-related osteoporosis without current pathological fracture: Secondary | ICD-10-CM | POA: Diagnosis present

## 2019-01-28 DIAGNOSIS — I129 Hypertensive chronic kidney disease with stage 1 through stage 4 chronic kidney disease, or unspecified chronic kidney disease: Secondary | ICD-10-CM | POA: Diagnosis present

## 2019-01-28 DIAGNOSIS — K760 Fatty (change of) liver, not elsewhere classified: Secondary | ICD-10-CM | POA: Diagnosis present

## 2019-01-28 DIAGNOSIS — Z79899 Other long term (current) drug therapy: Secondary | ICD-10-CM

## 2019-01-28 DIAGNOSIS — R062 Wheezing: Secondary | ICD-10-CM | POA: Diagnosis not present

## 2019-01-28 DIAGNOSIS — Z96651 Presence of right artificial knee joint: Secondary | ICD-10-CM | POA: Diagnosis present

## 2019-01-28 DIAGNOSIS — K219 Gastro-esophageal reflux disease without esophagitis: Secondary | ICD-10-CM | POA: Diagnosis present

## 2019-01-28 DIAGNOSIS — D631 Anemia in chronic kidney disease: Secondary | ICD-10-CM | POA: Diagnosis present

## 2019-01-28 DIAGNOSIS — E669 Obesity, unspecified: Secondary | ICD-10-CM | POA: Diagnosis present

## 2019-01-28 DIAGNOSIS — R195 Other fecal abnormalities: Secondary | ICD-10-CM | POA: Diagnosis not present

## 2019-01-28 DIAGNOSIS — N183 Chronic kidney disease, stage 3 unspecified: Secondary | ICD-10-CM

## 2019-01-28 DIAGNOSIS — F341 Dysthymic disorder: Secondary | ICD-10-CM

## 2019-01-28 DIAGNOSIS — N179 Acute kidney failure, unspecified: Secondary | ICD-10-CM | POA: Diagnosis not present

## 2019-01-28 DIAGNOSIS — K921 Melena: Secondary | ICD-10-CM

## 2019-01-28 DIAGNOSIS — Z8249 Family history of ischemic heart disease and other diseases of the circulatory system: Secondary | ICD-10-CM

## 2019-01-28 DIAGNOSIS — I10 Essential (primary) hypertension: Secondary | ICD-10-CM | POA: Diagnosis present

## 2019-01-28 DIAGNOSIS — R05 Cough: Secondary | ICD-10-CM | POA: Diagnosis not present

## 2019-01-28 DIAGNOSIS — Z7951 Long term (current) use of inhaled steroids: Secondary | ICD-10-CM

## 2019-01-28 DIAGNOSIS — F32A Depression, unspecified: Secondary | ICD-10-CM | POA: Diagnosis present

## 2019-01-28 DIAGNOSIS — Z6841 Body Mass Index (BMI) 40.0 and over, adult: Secondary | ICD-10-CM

## 2019-01-28 DIAGNOSIS — K5792 Diverticulitis of intestine, part unspecified, without perforation or abscess without bleeding: Secondary | ICD-10-CM | POA: Diagnosis not present

## 2019-01-28 DIAGNOSIS — M549 Dorsalgia, unspecified: Secondary | ICD-10-CM | POA: Diagnosis present

## 2019-01-28 DIAGNOSIS — F418 Other specified anxiety disorders: Secondary | ICD-10-CM | POA: Diagnosis present

## 2019-01-28 DIAGNOSIS — Z9049 Acquired absence of other specified parts of digestive tract: Secondary | ICD-10-CM

## 2019-01-28 DIAGNOSIS — I2510 Atherosclerotic heart disease of native coronary artery without angina pectoris: Secondary | ICD-10-CM | POA: Diagnosis present

## 2019-01-28 DIAGNOSIS — Z87891 Personal history of nicotine dependence: Secondary | ICD-10-CM

## 2019-01-28 DIAGNOSIS — Z791 Long term (current) use of non-steroidal anti-inflammatories (NSAID): Secondary | ICD-10-CM

## 2019-01-28 DIAGNOSIS — D509 Iron deficiency anemia, unspecified: Secondary | ICD-10-CM | POA: Diagnosis present

## 2019-01-28 DIAGNOSIS — R9431 Abnormal electrocardiogram [ECG] [EKG]: Secondary | ICD-10-CM | POA: Diagnosis present

## 2019-01-28 DIAGNOSIS — K5732 Diverticulitis of large intestine without perforation or abscess without bleeding: Secondary | ICD-10-CM | POA: Diagnosis present

## 2019-01-28 DIAGNOSIS — R059 Cough, unspecified: Secondary | ICD-10-CM

## 2019-01-28 DIAGNOSIS — Z833 Family history of diabetes mellitus: Secondary | ICD-10-CM

## 2019-01-28 DIAGNOSIS — Z981 Arthrodesis status: Secondary | ICD-10-CM

## 2019-01-28 DIAGNOSIS — F329 Major depressive disorder, single episode, unspecified: Secondary | ICD-10-CM | POA: Diagnosis present

## 2019-01-28 DIAGNOSIS — Z87442 Personal history of urinary calculi: Secondary | ICD-10-CM

## 2019-01-28 DIAGNOSIS — Z85820 Personal history of malignant melanoma of skin: Secondary | ICD-10-CM

## 2019-01-28 LAB — COMPREHENSIVE METABOLIC PANEL
ALT: 18 U/L (ref 0–44)
AST: 20 U/L (ref 15–41)
Albumin: 3.8 g/dL (ref 3.5–5.0)
Alkaline Phosphatase: 118 U/L (ref 38–126)
Anion gap: 10 (ref 5–15)
BUN: 27 mg/dL — ABNORMAL HIGH (ref 8–23)
CO2: 25 mmol/L (ref 22–32)
Calcium: 9.9 mg/dL (ref 8.9–10.3)
Chloride: 100 mmol/L (ref 98–111)
Creatinine, Ser: 1.93 mg/dL — ABNORMAL HIGH (ref 0.44–1.00)
GFR calc Af Amer: 28 mL/min — ABNORMAL LOW (ref >60)
GFR calc non Af Amer: 24 mL/min — ABNORMAL LOW (ref >60)
Glucose, Bld: 114 mg/dL — ABNORMAL HIGH (ref 70–99)
Potassium: 4.3 mmol/L (ref 3.5–5.1)
Sodium: 135 mmol/L (ref 135–145)
Total Bilirubin: 0.6 mg/dL (ref 0.3–1.2)
Total Protein: 7.3 g/dL (ref 6.5–8.1)

## 2019-01-28 LAB — CBC
HCT: 36 % (ref 36.0–46.0)
Hemoglobin: 10.8 g/dL — ABNORMAL LOW (ref 12.0–15.0)
MCH: 25.8 pg — ABNORMAL LOW (ref 26.0–34.0)
MCHC: 30 g/dL (ref 30.0–36.0)
MCV: 85.9 fL (ref 80.0–100.0)
Platelets: 444 10*3/uL — ABNORMAL HIGH (ref 150–400)
RBC: 4.19 MIL/uL (ref 3.87–5.11)
RDW: 15.9 % — ABNORMAL HIGH (ref 11.5–15.5)
WBC: 11.1 10*3/uL — ABNORMAL HIGH (ref 4.0–10.5)
nRBC: 0 % (ref 0.0–0.2)

## 2019-01-28 LAB — URINALYSIS, ROUTINE W REFLEX MICROSCOPIC
Bilirubin Urine: NEGATIVE
Glucose, UA: NEGATIVE mg/dL
Hgb urine dipstick: NEGATIVE
Ketones, ur: NEGATIVE mg/dL
Leukocytes,Ua: NEGATIVE
Nitrite: NEGATIVE
Protein, ur: NEGATIVE mg/dL
Specific Gravity, Urine: 1.006 (ref 1.005–1.030)
pH: 5 (ref 5.0–8.0)

## 2019-01-28 LAB — POC OCCULT BLOOD, ED: Fecal Occult Bld: POSITIVE — AB

## 2019-01-28 LAB — LIPASE, BLOOD: Lipase: 28 U/L (ref 11–51)

## 2019-01-28 MED ORDER — CIPROFLOXACIN IN D5W 400 MG/200ML IV SOLN: 400.0000 mg | Freq: Once | INTRAVENOUS | Status: DC

## 2019-01-28 MED ORDER — ONDANSETRON HCL 4 MG/2ML IJ SOLN
4.0000 mg | Freq: Once | INTRAMUSCULAR | Status: AC
  Administered 2019-01-28: 4 mg via INTRAVENOUS
  Filled 2019-01-28: qty 2

## 2019-01-28 MED ORDER — SODIUM CHLORIDE 0.9 % IV SOLN
80.0000 mg | Freq: Once | INTRAVENOUS | Status: AC
  Administered 2019-01-28: 80 mg via INTRAVENOUS
  Filled 2019-01-28 (×2): qty 80

## 2019-01-28 MED ORDER — PANTOPRAZOLE SODIUM 40 MG IV SOLR
40.0000 mg | Freq: Once | INTRAVENOUS | Status: AC
  Administered 2019-01-28: 18:00:00 40 mg via INTRAVENOUS
  Filled 2019-01-28: qty 40

## 2019-01-28 MED ORDER — LEVOTHYROXINE SODIUM 50 MCG PO TABS
50.0000 ug | ORAL_TABLET | Freq: Every day | ORAL | Status: DC
  Administered 2019-01-29: 22:00:00 50 ug via ORAL
  Filled 2019-01-28: qty 1

## 2019-01-28 MED ORDER — HYDRALAZINE HCL 20 MG/ML IJ SOLN: 5.0000 mg | INTRAMUSCULAR | Status: DC | PRN

## 2019-01-28 MED ORDER — SODIUM CHLORIDE 0.9 % IV SOLN
2.0000 g | INTRAVENOUS | Status: DC
  Administered 2019-01-29 (×2): 2 g via INTRAVENOUS
  Filled 2019-01-28: qty 20
  Filled 2019-01-28 (×2): qty 2

## 2019-01-28 MED ORDER — SODIUM CHLORIDE 0.9 % IV SOLN
INTRAVENOUS | Status: DC
  Administered 2019-01-29: 18:00:00 via INTRAVENOUS

## 2019-01-28 MED ORDER — CIPROFLOXACIN HCL 500 MG PO TABS: 500.0000 mg | ORAL_TABLET | Freq: Once | ORAL | Status: DC

## 2019-01-28 MED ORDER — HYDROCODONE-ACETAMINOPHEN 5-325 MG PO TABS: 1.0000 | ORAL_TABLET | ORAL | Status: DC | PRN

## 2019-01-28 MED ORDER — ONDANSETRON HCL 4 MG PO TABS: 4.0000 mg | ORAL_TABLET | Freq: Four times a day (QID) | ORAL | Status: DC | PRN

## 2019-01-28 MED ORDER — SODIUM CHLORIDE 0.9% FLUSH: 3.0000 mL | Freq: Once | INTRAVENOUS | Status: DC

## 2019-01-28 MED ORDER — METRONIDAZOLE IN NACL 5-0.79 MG/ML-% IV SOLN
500.0000 mg | Freq: Once | INTRAVENOUS | Status: AC
  Administered 2019-01-28: 500 mg via INTRAVENOUS
  Filled 2019-01-28: qty 100

## 2019-01-28 MED ORDER — NETARSUDIL-LATANOPROST 0.02-0.005 % OP SOLN: 1.0000 [drp] | Freq: Every day | OPHTHALMIC | Status: DC

## 2019-01-28 MED ORDER — METRONIDAZOLE IN NACL 5-0.79 MG/ML-% IV SOLN
500.0000 mg | Freq: Three times a day (TID) | INTRAVENOUS | Status: DC
  Administered 2019-01-29 – 2019-01-30 (×5): 500 mg via INTRAVENOUS
  Filled 2019-01-28 (×5): qty 100

## 2019-01-28 MED ORDER — METRONIDAZOLE IN NACL 5-0.79 MG/ML-% IV SOLN
500.0000 mg | Freq: Three times a day (TID) | INTRAVENOUS | Status: DC
  Administered 2019-01-28: 19:00:00 500 mg via INTRAVENOUS

## 2019-01-28 MED ORDER — ACETAMINOPHEN 650 MG RE SUPP: 650.0000 mg | Freq: Four times a day (QID) | RECTAL | Status: DC | PRN

## 2019-01-28 MED ORDER — METRONIDAZOLE 500 MG PO TABS: 500.0000 mg | ORAL_TABLET | Freq: Once | ORAL | Status: DC

## 2019-01-28 MED ORDER — GABAPENTIN 100 MG PO CAPS
100.0000 mg | ORAL_CAPSULE | Freq: Every day | ORAL | Status: DC
  Administered 2019-01-28 – 2019-01-29 (×2): 100 mg via ORAL
  Filled 2019-01-28 (×2): qty 1

## 2019-01-28 MED ORDER — PANTOPRAZOLE SODIUM 40 MG IV SOLR: 40.0000 mg | Freq: Two times a day (BID) | INTRAVENOUS | Status: DC

## 2019-01-28 MED ORDER — DULOXETINE HCL 30 MG PO CPEP
30.0000 mg | ORAL_CAPSULE | Freq: Every day | ORAL | Status: DC
  Administered 2019-01-28 – 2019-01-30 (×3): 30 mg via ORAL
  Filled 2019-01-28 (×3): qty 1

## 2019-01-28 MED ORDER — SODIUM CHLORIDE 0.9 % IV SOLN
8.0000 mg/h | INTRAVENOUS | Status: DC
  Administered 2019-01-29 (×3): 8 mg/h via INTRAVENOUS
  Filled 2019-01-28 (×4): qty 80

## 2019-01-28 MED ORDER — DORZOLAMIDE HCL 2 % OP SOLN
1.0000 [drp] | Freq: Two times a day (BID) | OPHTHALMIC | Status: DC
  Administered 2019-01-28 – 2019-01-30 (×4): 1 [drp] via OPHTHALMIC
  Filled 2019-01-28: qty 10

## 2019-01-28 MED ORDER — SODIUM CHLORIDE 0.9 % IV BOLUS
1000.0000 mL | Freq: Once | INTRAVENOUS | Status: AC
  Administered 2019-01-28: 1000 mL via INTRAVENOUS

## 2019-01-28 MED ORDER — ACETAMINOPHEN 325 MG PO TABS: 650.0000 mg | ORAL_TABLET | Freq: Four times a day (QID) | ORAL | Status: DC | PRN

## 2019-01-28 MED ORDER — ONDANSETRON HCL 4 MG/2ML IJ SOLN: 4.0000 mg | Freq: Four times a day (QID) | INTRAMUSCULAR | Status: DC | PRN

## 2019-01-28 NOTE — H&P (Signed)
History and Physical    SAYGE BRIENZA VOJ:[id number] DOB: 23-Mar-1935 DOA: 01/28/2019  PCP: Burnard Bunting, MD  Patient coming from: Home  Chief Complaint: Diarrhea  HPI: CLEON THOMA is a 84 y.o. female with medical history significant of hypothyroidism, HTN, diverticulosis, GERD, who presents with over a week of nausea, vomiting, diarrhea. She initially thought it was food poisoning after she had some takeout. She had fish and slaw, her husband did not have the slaw. She has lost her appetite and vomiting stopped about 3 days ago. She continues to have diarrhea, no bright red blood but the stool has been black. She took OTC anti-diarrheal without relief. No fevers, chills, nightsweats, chest pain, shortness of breath, new cough. She does admit to lower abdominal cramping.    ED Course: CT A/P revealed diverticulitis. FOBT positive. GI consulted. Cipro/flagyl and IVF started. GI PCR ordered and pending.   Review of Systems: As per HPI otherwise 10 point review of systems negative.   Past Medical History:  Diagnosis Date   Bronchiectasis ([place]) 10/01/2015   Diverticulosis of colon (without mention of hemorrhage) 07/17/2007   Gastritis 07/17/2007   GERD (gastroesophageal reflux disease)    Glaucoma    History of fatty infiltration of liver    Hyperlipemia    Hyperparathyroidism ([place])    Hypertension    Lumbosacral root lesions, not elsewhere classified 04/13/2014   Obese    Osteoarthritis    Osteoporosis    PONV (postoperative nausea and vomiting)    Not in last several surgeries   Renal calculus, right    severe    Past Surgical History:  Procedure Laterality Date   [place]   LEFT--BENIGN    [place]   COLONOSCOPY  2008   ESOPHAGOGASTRODUODENOSCOPY  2008   EYE SURGERY     Lasik, Cataract, Hx of Glaucoma   KNEE ARTHROPLASTY  2004   RIGHT-TOTAL     LUMBAR FUSION  2008   PARATHYROIDECTOMY  2009   X 2--RIGHT INFERIOR AND LEFT SUPERIOR   PARATHYROIDECTOMY N/A 01/04/2017   Procedure: RIGHT NECK EXPLORATION WITH PARATHYROIDECTOMY;  Surgeon: Armandina Gemma, MD;  Location: [facility];  Service: General;  Laterality: N/A;   ROTATOR CUFF REPAIR  2000   RIGHT   TUBAL LIGATION  1974     reports that she quit smoking about 4 years ago. Her smoking use included cigarettes. She has a 3.75 pack-year smoking history. She has never used smokeless tobacco. She reports current alcohol use of about 1.0 standard drinks of alcohol per week. She reports that she does not use drugs.  Allergies  Allergen Reactions   Codeine Nausea And Vomiting   Contrast Media [Iodinated Diagnostic Agents] Hives, Shortness Of Breath and Itching   Iohexol Hives and Other (See Comments)    REACTION: " TROUBLE BREATHING"   Aspirin     In high doses bleeding   Fentanyl     Altered mental state    Family History  Problem Relation Age of Onset   Colon polyps Mother    Hypertension Mother    Osteoarthritis Mother    Diabetes Father    Stroke Father    Hernia Father        Esophageal hernia   Osteoarthritis Maternal Grandmother    Stroke Maternal Grandfather    Liver disease Paternal Grandmother    Stroke Paternal Grandfather  Prior to Admission medications   Medication Sig Start Date End Date Taking? Authorizing Provider  Calcium Carbonate-Vitamin D (CALCIUM-VITAMIN D) 500-200 MG-UNIT per tablet Take 1 tablet by mouth every other day.    Yes [provider]  dorzolamide (TRUSOPT) 2 % ophthalmic solution Place 1 drop into both eyes 2 (two) times daily.  11/15/15  Yes [provider]  DULoxetine (CYMBALTA) 30 MG capsule Take 30 mg by mouth daily.   Yes [provider]  esomeprazole (NEXIUM) 40 MG capsule Take 40 mg by mouth daily before breakfast.    Yes [provider]  fluticasone (FLONASE) 50 MCG/ACT nasal spray  Place 2 sprays into both nostrils daily. Patient taking differently: Place 2 sprays into both nostrils at bedtime as needed for allergies.  09/01/15  Yes Skeet Latch, MD  gabapentin (NEURONTIN) 100 MG capsule Take 100 mg by mouth at bedtime. 11/21/18  Yes [provider]  ibuprofen (ADVIL,MOTRIN) 200 MG tablet Take 400 mg by mouth 2 (two) times daily.    Yes [provider]  levothyroxine (SYNTHROID) 50 MCG tablet Take 50 mcg by mouth daily. 12/18/18  Yes [provider]  losartan (COZAAR) 100 MG tablet TAKE 1 TABLET EACH DAY. Patient taking differently: Take 100 mg by mouth daily.  07/24/16  Yes Skeet Latch, MD  Multiple Vitamins-Minerals (PRESERVISION AREDS 2 PO) Take 1 capsule by mouth 2 (two) times daily.   Yes [provider]  ondansetron (ZOFRAN) 4 MG tablet Take 4 mg by mouth 4 (four) times daily as needed for nausea/vomiting. 01/25/19  Yes [provider]  ROCKLATAN 0.02-0.005 % SOLN Place 1 drop into both eyes at bedtime. 01/08/19  Yes [provider]  triamterene-hydrochlorothiazide (MAXZIDE-25) 37.5-25 MG tablet Take 1 tablet by mouth daily. 11/13/15  Yes [provider]  zoledronic acid (RECLAST) 5 MG/100ML SOLN injection Inject 5 mg into the vein yearly. (Start June 2015)   Yes [provider]  benzonatate (TESSALON) 100 MG capsule Take 1 capsule (100 mg total) by mouth 3 (three) times daily as needed for cough. Patient not taking: Reported on 01/28/2019 01/30/18   Melvenia Needles, NP    Physical Exam: Vitals:   01/28/19 1305 01/28/19 1345 01/28/19 1426 01/28/19 1653  BP: 122/76  (!) 111/45 (!) 131/43  Pulse: 73  73 77  Resp: 18  19 (!) 25  Temp: 99 F (37.2 C)     TempSrc: Oral     SpO2:  95% 98% 98%  Weight: 106.6 kg     Height: 5\' 3"  (1.6 m)        Constitutional: NAD, calm, comfortable Eyes: PERRL, lids and conjunctivae normal ENMT: Mucous membranes are moist. Posterior pharynx clear of any  exudate or lesions.Normal dentition.  Neck: normal, supple, no masses, no thyromegaly Respiratory: clear to auscultation bilaterally, no wheezing, no crackles. Normal respiratory effort. No accessory muscle use.  Cardiovascular: Regular rate and rhythm, no murmurs / rubs / gallops. No extremity edema.  Abdomen: TTP lower abdomen. Bowel sounds positive.  Musculoskeletal: no clubbing / cyanosis. No joint deformity upper and lower extremities. Normal muscle tone.  Skin: no rashes, lesions, ulcers Neurologic: CN 2-12 grossly intact. Strength 5/5 in all 4.  Psychiatric: Normal judgment and insight. Alert and oriented x 3. Normal mood.   Labs on Admission: I have personally reviewed following labs and imaging studies  CBC: Recent Labs  Lab 01/28/19 1339  WBC 11.1*  HGB 10.8*  HCT 36.0  MCV 85.9  PLT  726*   Basic Metabolic Panel: Recent Labs  Lab 01/28/19 1339  NA 135  K 4.3  CL 100  CO2 25  GLUCOSE 114*  BUN 27*  CREATININE 1.93*  CALCIUM 9.9   GFR: Estimated Creatinine Clearance: 26.7 mL/min (A) (by C-G formula based on SCr of 1.93 mg/dL (H)). Liver Function Tests: Recent Labs  Lab 01/28/19 1339  AST 20  ALT 18  ALKPHOS 118  BILITOT 0.6  PROT 7.3  ALBUMIN 3.8   Recent Labs  Lab 01/28/19 1339  LIPASE 28   No results for input(s): AMMONIA in the last 168 hours. Coagulation Profile: No results for input(s): INR, PROTIME in the last 168 hours. Cardiac Enzymes: No results for input(s): CKTOTAL, CKMB, CKMBINDEX, TROPONINI in the last 168 hours. BNP (last 3 results) No results for input(s): PROBNP in the last 8760 hours. HbA1C: No results for input(s): HGBA1C in the last 72 hours. CBG: No results for input(s): GLUCAP in the last 168 hours. Lipid Profile: No results for input(s): CHOL, HDL, LDLCALC, TRIG, CHOLHDL, LDLDIRECT in the last 72 hours. Thyroid Function Tests: No results for input(s): TSH, T4TOTAL, FREET4, T3FREE, THYROIDAB in the last 72 hours. Anemia  Panel: No results for input(s): VITAMINB12, FOLATE, FERRITIN, TIBC, IRON, RETICCTPCT in the last 72 hours. Urine analysis:    Component Value Date/Time   COLORURINE STRAW (A) 01/28/2019 1646   APPEARANCEUR CLEAR 01/28/2019 1646   LABSPEC 1.006 01/28/2019 1646   PHURINE 5.0 01/28/2019 1646   GLUCOSEU NEGATIVE 01/28/2019 1646   HGBUR NEGATIVE 01/28/2019 1646   BILIRUBINUR NEGATIVE 01/28/2019 1646   KETONESUR NEGATIVE 01/28/2019 1646   PROTEINUR NEGATIVE 01/28/2019 1646   UROBILINOGEN 0.2 06/01/2010 0915   NITRITE NEGATIVE 01/28/2019 1646   LEUKOCYTESUR NEGATIVE 01/28/2019 1646   Sepsis Labs: !!!!!!!!!!!!!!!!!!!!!!!!!!!!!!!!!!!!!!!!!!!! @LABRCNTIP (procalcitonin:4,lacticidven:4) )No results found for this or any previous visit (from the past 240 hour(s)).   Radiological Exams on Admission: Ct Abdomen Pelvis Wo Contrast  Result Date: 01/28/2019 CLINICAL DATA:  Nausea, vomiting, and diarrhea for the past 10 days. EXAM: CT ABDOMEN AND PELVIS WITHOUT CONTRAST TECHNIQUE: Multidetector CT imaging of the abdomen and pelvis was performed following the standard protocol without IV contrast. COMPARISON:  CT abdomen dated September 06, 2009. FINDINGS: Lower chest: No acute abnormality. Several 3-4 mm pulmonary nodules in the right lung base are unchanged since 2010, consistent with benign etiology. Hepatobiliary: No focal liver abnormality is seen. Status post cholecystectomy. Moderate common bile duct and mild central intrahepatic biliary dilatation is likely due to post cholecystectomy state. Pancreas: Unremarkable. No pancreatic ductal dilatation or surrounding inflammatory changes. Spleen: Normal in size without focal abnormality. Adrenals/Urinary Tract: The adrenal glands are unremarkable. Bilateral renal simple cysts. The dominant right renal cyst has decreased in size, now measuring 3.9 cm, previously 6.3 cm. There is a new 8 mm slightly hyperdense lesion arising from the midpole of the right kidney,  too small to characterize, but likely a hemorrhagic/proteinaceous cyst. Bilateral nonobstructive renal calculi again noted. No hydronephrosis. The bladder is unremarkable. Stomach/Bowel: Stomach is within normal limits. No obstruction. Extensive left-sided colonic diverticulosis with mild inflammatory changes surrounding a sigmoid diverticula (series 2, image 66). The appendix is surgically absent. Vascular/Lymphatic: Aortic atherosclerosis. No enlarged abdominal or pelvic lymph nodes. Reproductive: Small calcified posterior fibroid.  No adnexal mass. Other: No abdominal wall hernia or abnormality. No abdominopelvic ascites. No pneumoperitoneum. Musculoskeletal: No acute or significant osseous findings. Prior L2-L5 fusion with prior removal of the L5 screws. Severe degenerative disc disease at T12-L1. IMPRESSION:  1. Findings suspicious for mild sigmoid diverticulitis. No perforation or abscess. 2. Unchanged bilateral nonobstructive nephrolithiasis. 3.  Aortic atherosclerosis (ICD10-I70.0). Electronically Signed   By: Titus Dubin M.D.   On: 01/28/2019 16:50    EKG: Independently reviewed. Normal sinus rhythm  Assessment/Plan Principal Problem:   Acute diverticulitis Active Problems:   ANXIETY DEPRESSION   Essential hypertension   Melena   AKI (acute kidney injury) (HCC)   CKD (chronic kidney disease) stage 3, GFR 30-59 ml/min (HCC)   Hypothyroid   Acute sigmoid diverticulitis  -Cipro/flagyl -IVF   Melena  -GI consulted  -Trend CBC  -NPO -PPI  AKI on CKD stage 3 -Baseline Cr 1.3 -IVF -Hold Cozaar, maxzide -Trend BMP   HTN -Hold Cozaar, maxzide -Hydralazine IV prn   Depression -Continue Cymbalta  Hypothyroidism -Continue Synthroid   DVT prophylaxis: SCD Code Status: Full  Family Communication: None Disposition Plan: Pending improvement and GI consultation  Consults called: GI   Admission status: Inpatient   Severity of Illness: The appropriate patient status for  this patient is INPATIENT. Inpatient status is judged to be reasonable and necessary in order to provide the required intensity of service to ensure the patient's safety. The patient's presenting symptoms, physical exam findings, and initial radiographic and laboratory data in the context of their chronic comorbidities is felt to place them at high risk for further clinical deterioration. Furthermore, it is not anticipated that the patient will be medically stable for discharge from the hospital within 2 midnights of admission.  * I certify that at the point of admission it is my clinical judgment that the patient will require inpatient hospital care spanning beyond 2 midnights from the point of admission due to high intensity of service, high risk for further deterioration and high frequency of surveillance required.Dessa Phi, DO [facility] 01/28/2019, 5:42 PM    How to contact the [facility] Attending or Consulting provider [place] or covering provider during after hours [place], for this patient?  1. Check the care team in [facility], [facility], [facility] and look for a) attending/consulting [facility] provider listed and b) the [facility] team listed 2. Log into [email or web address] and use [place]'s universal password to access. If you do not have the password, please contact the hospital operator. 3. Locate the [facility] provider you are looking for under [facility] and page to a number that you can be directly reached. 4. If you still have difficulty reaching the provider, please page the [facility] (Director on Call) for the Hospitalists listed on amion for assistance.

## 2019-01-28 NOTE — ED Triage Notes (Signed)
Patient's husband reported that the patient has had N/v/D x 10 days and was told to take Pepto Bismol by her doctor last week. Patient's husband states she had black stools since,but has not had the Pepto Bismol for several days. Patient denies any pain.

## 2019-01-28 NOTE — ED Notes (Signed)
Pt unable to give stool sample at this time.  Pt sts "I think Im fresh out"  RN notified.

## 2019-01-28 NOTE — ED Provider Notes (Signed)
[facility] DEPT Provider Note   CSN: [id number] Arrival date & time: 01/28/19  1254    History   Chief Complaint Chief Complaint  Patient presents with   Emesis   Diarrhea    HPI [name] is a 84 y.o. female with history of obesity, hiatal hernia, GERD, HLD, HTN, hyperparathyroidism, chronic back pain status post multiple surgeries, sleep apnea, CAD is here for evaluation of nausea associated with painless diarrhea.  Onset approximately 10 days ago.  Initially had some episodes of nonbloody nonbilious emesis 1 week ago but this resolved.  The diarrhea is described as watery, large-volume, black.  She has at least 5 episodes of diarrhea in 1 day.  For the last few days she is felt slightly lightheaded, weak.  She is very thirsty.  Has had decreased oral intake because eating makes her nauseous.  Has been tolerating fluids without vomiting.  Remembers having coleslaw the night before everything started and thinks diarrhea may be related to this.  She called her PCP who prescribed her medicine for nausea and this morning RN told her to come to the ED.  She has been taking Pepto Bismol daily, last this morning. Last EGD/colonoscopy 2008 showed diverticulosis and mild gastritis.  S/p cholecystectomy and appendectomy. No associated fever, chills, abdominal pain, hematemesis, hematochezia, dysuria.   No associated cough, congestion, sore throat, chest pain, changes in her breathing.  No recent travel.  No sick contacts.  No exposure to confirmed or suspected COVID-19.  No recent antibiotics. HPI  Past Medical History:  Diagnosis Date   Bronchiectasis ([place]) 10/01/2015   Diverticulosis of colon (without mention of hemorrhage) 07/17/2007   Gastritis 07/17/2007   GERD (gastroesophageal reflux disease)    Glaucoma    History of fatty infiltration of liver    Hyperlipemia    Hyperparathyroidism ([place])    Hypertension    Lumbosacral root lesions, not  elsewhere classified 04/13/2014   Obese    Osteoarthritis    Osteoporosis    PONV (postoperative nausea and vomiting)    Not in last several surgeries   Renal calculus, right    severe    Patient Active Problem List   Diagnosis Date Noted   Acute diverticulitis 01/28/2019   Melena 01/28/2019   AKI (acute kidney injury) ([place]) 01/28/2019   CKD (chronic kidney disease) stage 3, GFR 30-59 ml/min ([place]) 01/28/2019   Acute bronchitis 01/30/2018   OSA (obstructive sleep apnea) 11/10/2017   Hyperparathyroidism ([place]) 01/04/2017   Hyperparathyroidism, primary ([place]) 01/01/2017   Cough variant asthma vs uacs vs obst bronchiectasis  10/23/2015   Severe obesity (BMI >= 40) ([place]) 10/08/2015   Bronchiectasis ([place]) 10/01/2015   Lumbosacral root lesions, not elsewhere classified 04/13/2014   HYPERLIPIDEMIA 12/11/2007   ANXIETY DEPRESSION 12/11/2007   Essential hypertension 12/11/2007   IRRITABLE BOWEL SYNDROME 12/11/2007   RECTAL POLYPS 12/11/2007   NEPHROLITHIASIS, RECURRENT 12/11/2007   MELANOMA, HX OF 12/11/2007   GASTRITIS 07/17/2007   DIVERTICULITIS OF COLON 11/19/2002    Past Surgical History:  Procedure Laterality Date   APPENDECTOMY  1974   BREAST BIOPSY  1988   LEFT--BENIGN    [facility]   COLONOSCOPY  2008   ESOPHAGOGASTRODUODENOSCOPY  2008   EYE SURGERY     Lasik, Cataract, Hx of Glaucoma   KNEE ARTHROPLASTY  2004   RIGHT-TOTAL    LUMBAR FUSION  2008   PARATHYROIDECTOMY  2009   X 2--RIGHT INFERIOR AND LEFT SUPERIOR   PARATHYROIDECTOMY N/A 01/04/2017   Procedure: RIGHT NECK EXPLORATION WITH PARATHYROIDECTOMY;  Surgeon: Armandina Gemma, MD;  Location: [facility];  Service: General;  Laterality: N/A;   ROTATOR CUFF REPAIR  2000   RIGHT   TUBAL LIGATION  1974     OB History   No obstetric history on file.      Home Medications    Prior to Admission medications   Medication  Sig Start Date End Date Taking? Authorizing Provider  Calcium Carbonate-Vitamin D (CALCIUM-VITAMIN D) 500-200 MG-UNIT per tablet Take 1 tablet by mouth every other day.    Yes [provider]  dorzolamide (TRUSOPT) 2 % ophthalmic solution Place 1 drop into both eyes 2 (two) times daily.  11/15/15  Yes [provider]  DULoxetine (CYMBALTA) 30 MG capsule Take 30 mg by mouth daily.   Yes [provider]  esomeprazole (NEXIUM) 40 MG capsule Take 40 mg by mouth daily before breakfast.    Yes [provider]  fluticasone (FLONASE) 50 MCG/ACT nasal spray Place 2 sprays into both nostrils daily. Patient taking differently: Place 2 sprays into both nostrils at bedtime as needed for allergies.  09/01/15  Yes Skeet Latch, MD  gabapentin (NEURONTIN) 100 MG capsule Take 100 mg by mouth at bedtime. 11/21/18  Yes [provider]  ibuprofen (ADVIL,MOTRIN) 200 MG tablet Take 400 mg by mouth 2 (two) times daily.    Yes [provider]  levothyroxine (SYNTHROID) 50 MCG tablet Take 50 mcg by mouth daily. 12/18/18  Yes [provider]  losartan (COZAAR) 100 MG tablet TAKE 1 TABLET EACH DAY. Patient taking differently: Take 100 mg by mouth daily.  07/24/16  Yes Skeet Latch, MD  Multiple Vitamins-Minerals (PRESERVISION AREDS 2 PO) Take 1 capsule by mouth 2 (two) times daily.   Yes [provider]  ondansetron (ZOFRAN) 4 MG tablet Take 4 mg by mouth 4 (four) times daily as needed for nausea/vomiting. 01/25/19  Yes [provider]  ROCKLATAN 0.02-0.005 % SOLN Place 1 drop into both eyes at bedtime. 01/08/19  Yes [provider]  triamterene-hydrochlorothiazide (MAXZIDE-25) 37.5-25 MG tablet Take 1 tablet by mouth daily. 11/13/15  Yes [provider]  zoledronic acid (RECLAST) 5 MG/100ML SOLN injection Inject 5 mg into the vein yearly. [facility] June 2015)   Yes [provider]    Family History Family History    Problem Relation Age of Onset   Colon polyps Mother    Hypertension Mother    Osteoarthritis Mother    Diabetes Father    Stroke Father    Hernia Father        Esophageal hernia   Osteoarthritis Maternal Grandmother    Stroke Maternal Grandfather    Liver disease Paternal Grandmother    Stroke Paternal Grandfather     Social History Social History   Tobacco Use   Smoking status: Former Smoker    Packs/day: 0.25    Years: 15.00    Pack years: 3.75    Types: Cigarettes    Last attempt to quit: 09/01/2014    Years since quitting: 4.4   Smokeless tobacco: Never Used  Substance Use Topics   Alcohol use: Yes    Alcohol/week: 1.0 standard drinks    Types: 1 Glasses of wine per week    Comment: ocassional   Drug use: No     Allergies   Codeine; Contrast media [iodinated diagnostic agents]; Iohexol; Aspirin; and  Fentanyl   Review of Systems Review of Systems  Constitutional:       Generalized weakness   Gastrointestinal: Positive for diarrhea, nausea and vomiting (resolved).       Black stool  Neurological: Positive for light-headedness.  All other systems reviewed and are negative.    Physical Exam Updated Vital Signs BP (!) 131/43 (BP Location: Right Wrist)    Pulse 77    Temp 99 F (37.2 C) (Oral)    Resp (!) 25    Ht 5\' 3"  (1.6 m)    Wt 106.6 kg    SpO2 98%    BMI 41.63 kg/m   Physical Exam Vitals signs and nursing note reviewed.  Constitutional:      Appearance: She is well-developed.     Comments: Elderly obese woman in NAD  HENT:     Head: Normocephalic and atraumatic.     Nose: Nose normal.     Mouth/Throat:     Mouth: Mucous membranes are dry.     Comments: Dry lips but MMM Eyes:     Conjunctiva/sclera: Conjunctivae normal.  Neck:     Musculoskeletal: Normal range of motion.  Cardiovascular:     Rate and Rhythm: Normal rate and regular rhythm.  Pulmonary:     Effort: Pulmonary effort is normal.     Breath sounds: Normal breath  sounds.  Abdominal:     Tenderness: There is no abdominal tenderness.     Comments: Obese soft abdomen. No G/R/R. No suprapubic or CVA tenderness. Negative Murphy's and McBurney's.   Genitourinary:    Rectum: Guaiac result positive.     Comments: Melenotic stool on DRE. External non bloody non tender hemorrhoids noted. Good rectal tone.  Musculoskeletal: Normal range of motion.     Comments: Transfers from chair into bed without assistance   Skin:    General: Skin is warm and dry.     Capillary Refill: Capillary refill takes less than 2 seconds.  Neurological:     Mental Status: She is alert.  Psychiatric:        Behavior: Behavior normal.      ED Treatments / Results  Labs (all labs ordered are listed, but only abnormal results are displayed) Labs Reviewed  COMPREHENSIVE METABOLIC PANEL - Abnormal; Notable for the following components:      Result Value   Glucose, Bld 114 (*)    BUN 27 (*)    Creatinine, Ser 1.93 (*)    GFR calc non Af Amer 24 (*)    GFR calc Af Amer 28 (*)    All other components within normal limits  CBC - Abnormal; Notable for the following components:   WBC 11.1 (*)    Hemoglobin 10.8 (*)    MCH 25.8 (*)    RDW 15.9 (*)    Platelets 444 (*)    All other components within normal limits  URINALYSIS, ROUTINE W REFLEX MICROSCOPIC - Abnormal; Notable for the following components:   Color, Urine STRAW (*)    All other components within normal limits  POC OCCULT BLOOD, ED - Abnormal; Notable for the following components:   Fecal Occult Bld POSITIVE (*)    All other components within normal limits  GASTROINTESTINAL PANEL BY PCR, STOOL (REPLACES STOOL CULTURE)  LIPASE, BLOOD    EKG EKG Interpretation  Date/Time:  Tuesday January 28 2019 14:32:47 EDT Ventricular Rate:  73 PR Interval:    QRS Duration: 93 QT Interval:  395 QTC Calculation: 436 R  Axis:   10 Text Interpretation:  Sinus rhythm Borderline prolonged PR interval Low voltage, precordial  leads Confirmed by Virgel Manifold [phone]) on 01/28/2019 2:37:00 PM   Radiology Ct Abdomen Pelvis Wo Contrast  Result Date: 01/28/2019 CLINICAL DATA:  Nausea, vomiting, and diarrhea for the past 10 days. EXAM: CT ABDOMEN AND PELVIS WITHOUT CONTRAST TECHNIQUE: Multidetector CT imaging of the abdomen and pelvis was performed following the standard protocol without IV contrast. COMPARISON:  CT abdomen dated September 06, 2009. FINDINGS: Lower chest: No acute abnormality. Several 3-4 mm pulmonary nodules in the right lung base are unchanged since 2010, consistent with benign etiology. Hepatobiliary: No focal liver abnormality is seen. Status post cholecystectomy. Moderate common bile duct and mild central intrahepatic biliary dilatation is likely due to post cholecystectomy state. Pancreas: Unremarkable. No pancreatic ductal dilatation or surrounding inflammatory changes. Spleen: Normal in size without focal abnormality. Adrenals/Urinary Tract: The adrenal glands are unremarkable. Bilateral renal simple cysts. The dominant right renal cyst has decreased in size, now measuring 3.9 cm, previously 6.3 cm. There is a new 8 mm slightly hyperdense lesion arising from the midpole of the right kidney, too small to characterize, but likely a hemorrhagic/proteinaceous cyst. Bilateral nonobstructive renal calculi again noted. No hydronephrosis. The bladder is unremarkable. Stomach/Bowel: Stomach is within normal limits. No obstruction. Extensive left-sided colonic diverticulosis with mild inflammatory changes surrounding a sigmoid diverticula (series 2, image 66). The appendix is surgically absent. Vascular/Lymphatic: Aortic atherosclerosis. No enlarged abdominal or pelvic lymph nodes. Reproductive: Small calcified posterior fibroid.  No adnexal mass. Other: No abdominal wall hernia or abnormality. No abdominopelvic ascites. No pneumoperitoneum. Musculoskeletal: No acute or significant osseous findings. Prior L2-L5 fusion with  prior removal of the L5 screws. Severe degenerative disc disease at T12-L1. IMPRESSION: 1. Findings suspicious for mild sigmoid diverticulitis. No perforation or abscess. 2. Unchanged bilateral nonobstructive nephrolithiasis. 3.  Aortic atherosclerosis (ICD10-I70.0). Electronically Signed   By: Titus Dubin M.D.   On: 01/28/2019 16:50    Procedures Procedures (including critical care time)  Medications Ordered in ED Medications  sodium chloride flush (NS) 0.9 % injection 3 mL ( Intravenous Canceled Entry 01/28/19 1437)  pantoprazole (PROTONIX) injection 40 mg (has no administration in time range)  metroNIDAZOLE (FLAGYL) IVPB 500 mg (has no administration in time range)  ciprofloxacin (CIPRO) IVPB 400 mg (has no administration in time range)  sodium chloride 0.9 % bolus 1,000 mL (0 mLs Intravenous Stopped 01/28/19 1437)  ondansetron (ZOFRAN) injection 4 mg (4 mg Intravenous Given 01/28/19 1341)     Initial Impression / Assessment and Plan / ED Course  I have reviewed the triage vital signs and the nursing notes.  Pertinent labs & imaging results that were available during my care of the patient were reviewed by me and considered in my medical decision making (see chart for details).  Clinical Course as of Jan 27 1741  Tue Jan 28, 2019  1439 Creatinine(!): 1.93 [CG]  1439 GFR, Est Non African American(!): 24 [CG]  1439 WBC(!): 11.1 [CG]  1529 Fecal Occult Blood, POC(!): POSITIVE [CG]  1653 May be falsely high given creatinine, dehydration  Hemoglobin(!): 10.8 [CG]  1655 IMPRESSION: 1. Findings suspicious for mild sigmoid diverticulitis. No perforation or abscess. 2. Unchanged bilateral nonobstructive nephrolithiasis. 3. Aortic atherosclerosis (ICD10-I70.0).  CT ABDOMEN PELVIS WO CONTRAST [CG]    Clinical Course User Index [CG] Kinnie Feil, PA-C      ddx includes viral gastroenteritis vs diverticulitis.  May be slightly dehydrated. S/p cholecystectomy and appendectomy.  No sick contacts. No recent antibiotics. Doubt C. Difficile diarrhea. She has no constitutional symptoms, abdominal pain and lower suspicion for mesenteric ischemia. Reports black stools but taking pepto bismol daily, will have to check hemoccult. Labs, UA, give IVF and zofran.  Attempt to collect stool sample for infectious diarrhea. Low threshold for CTAP given age and chronicity of symptoms.   1653: Work-up as above concerning for mild sigmoid diverticulitis, mild acute kidney injury likely due to GI losses.  Melanotic stool with positive Hemoccult.  Hemoglobin 10.8 in setting of elevated creatinine/dehydration this may be falsely high.  Will give antibiotics.  Has been seen [facility], pending consult. Will request admission/obs.  Final Clinical Impressions(s) / ED Diagnoses   1740: Accepted by Dr Maylene Roes. Spoke to [facility] Dr Lyndel Safe who is aware of patient. IV protonix, cipro, flagyl ordered.  Final diagnoses:  Diverticulitis  Acute kidney injury ([place])  Melena  Dehydration  Low hemoglobin    ED Discharge Orders    None       Arlean Hopping 01/28/19 1742    Virgel Manifold, MD 01/29/19 [phone]

## 2019-01-29 DIAGNOSIS — I10 Essential (primary) hypertension: Secondary | ICD-10-CM

## 2019-01-29 DIAGNOSIS — D500 Iron deficiency anemia secondary to blood loss (chronic): Secondary | ICD-10-CM

## 2019-01-29 DIAGNOSIS — R195 Other fecal abnormalities: Secondary | ICD-10-CM

## 2019-01-29 DIAGNOSIS — N183 Chronic kidney disease, stage 3 (moderate): Secondary | ICD-10-CM

## 2019-01-29 DIAGNOSIS — N179 Acute kidney failure, unspecified: Secondary | ICD-10-CM

## 2019-01-29 LAB — IRON AND TIBC
Iron: 27 ug/dL — ABNORMAL LOW (ref 28–170)
Saturation Ratios: 7 % — ABNORMAL LOW (ref 10.4–31.8)
TIBC: 381 ug/dL (ref 250–450)
UIBC: 354 ug/dL

## 2019-01-29 LAB — RETICULOCYTES
Immature Retic Fract: 19.9 % — ABNORMAL HIGH (ref 2.3–15.9)
RBC.: 3.81 MIL/uL — ABNORMAL LOW (ref 3.87–5.11)
Retic Count, Absolute: 48.8 10*3/uL (ref 19.0–186.0)
Retic Ct Pct: 1.3 % (ref 0.4–3.1)

## 2019-01-29 LAB — CBC
HCT: 34.3 % — ABNORMAL LOW (ref 36.0–46.0)
Hemoglobin: 10 g/dL — ABNORMAL LOW (ref 12.0–15.0)
MCH: 25.4 pg — ABNORMAL LOW (ref 26.0–34.0)
MCHC: 29.2 g/dL — ABNORMAL LOW (ref 30.0–36.0)
MCV: 87.1 fL (ref 80.0–100.0)
Platelets: 374 10*3/uL (ref 150–400)
RBC: 3.94 MIL/uL (ref 3.87–5.11)
RDW: 15.8 % — ABNORMAL HIGH (ref 11.5–15.5)
WBC: 7.2 10*3/uL (ref 4.0–10.5)
nRBC: 0 % (ref 0.0–0.2)

## 2019-01-29 LAB — BASIC METABOLIC PANEL
Anion gap: 8 (ref 5–15)
BUN: 23 mg/dL (ref 8–23)
CO2: 22 mmol/L (ref 22–32)
Calcium: 9.2 mg/dL (ref 8.9–10.3)
Chloride: 109 mmol/L (ref 98–111)
Creatinine, Ser: 1.79 mg/dL — ABNORMAL HIGH (ref 0.44–1.00)
GFR calc Af Amer: 30 mL/min — ABNORMAL LOW (ref >60)
GFR calc non Af Amer: 26 mL/min — ABNORMAL LOW (ref >60)
Glucose, Bld: 108 mg/dL — ABNORMAL HIGH (ref 70–99)
Potassium: 3.7 mmol/L (ref 3.5–5.1)
Sodium: 139 mmol/L (ref 135–145)

## 2019-01-29 LAB — VITAMIN B12: Vitamin B-12: 445 pg/mL (ref 180–914)

## 2019-01-29 LAB — FOLATE: Folate: 12.9 ng/mL (ref >5.9)

## 2019-01-29 LAB — FERRITIN: Ferritin: 14 ng/mL (ref 11–307)

## 2019-01-29 NOTE — Consult Note (Addendum)
Referring Provider: [facility] Primary Care Physician:  Burnard Bunting, MD Primary Gastroenterologist: Previously Dr. Sharlett Iles     Reason for Consultation:   Possible melena    ASSESSMENT / PLAN:    55.  84 year old female with several days of intermittent nausea, vomiting, diarrhea and lower abdominal pain.  CT scan suggesting mild sigmoid diverticulitis. Minimally elevated WBC in ED yesterday has normalized -continue IV antibiotics -Clear liquid diet.  -Eventual outpatient colonoscopy, her last one was in 2008  2. Nausea, vomiting, diarrhea x 1 week may be related to diverticulitis though diarrhea is an atypical manifestation.  Rule out infectious diarrhea.  Rule out GI bleed. Stool black at home but that was after taking Pepto-Bismol.  She is FOBT positive and does take NSAIDS (?GI bleed , perianal irritation from diarrhea).  -Stool studies ordered but she has not had any bowel movements in 24 hours.  Would discontinue stool study orders if no further diarrhea in the next 24 hours -Continue PPI.  Leaving her on drip for now but she is 24 hours out from any BMs making active bleed unlikely so can change to PO tomorrow if no signs of bleeding.  -BUN not elevated out of proportion to creatinine to suggest upper GI bleeding.  Her hemoglobin has declined from 10.8 overnight to 10.0 this a.m. but that was after IV fluids.  See #3.   3. Normocytic anemia.  Baseline hemoglobin hard to discern. In 2017 it was 11.2, it was 9.5 in April 2018.  She has CKD likely contributing to chronic anemia.   -Trend hemoglobin  -check iron studies.  -If hgb trends down and /or having recurrent black stools then will proceed with inpatient EGD  4.  AKI on CKD.  Presenting creatinine 1.93, better today.  Not back to baseline creatinine of 1.3   5.  CBD and intrahepatic biliary duct dilation favored to be secondary to postcholecystectomy state.  Her liver tests are normal .    HPI:     [name] is a 84 y.o. female with past medical history significant for GERD, hypothyroidism, chronic back pain.,  Hypertension, history of cholecystectomy, history of appendectomy, history of melanoma.  Last week patient developed acute nausea, vomiting, diarrhea.  She also describes onset of left lower quadrant discomfort.  Initially patient thought symptoms were related to food poisoning after eating out.  Her husband did not get sick consuming the same foods with the exception of coleslaw.  Patient called her physician and was prescribed something for nausea and diarrhea.  She apparently also took some bismuth.  Symptoms resolved for couple days though she really was not having much p.o. intake.  Following the weekend she had a recurrence of the nausea, vomiting and diarrhea.  The diarrhea was much worse this time.  Stools a lot more liquid and more frequent.  She also noticed to be black and explosive.  No upper abdominal pain.  Patient has taken ibuprofen a couple times a day for many years.  She does take Nexium every day for GERD.  Today patient does feel better.  She has not had a bowel movement since before coming to the emergency department yesterday.  No further nausea.  Stool studies have not been able to be collected due to lack of bowel movement.  Past Medical History:  Diagnosis Date  . Bronchiectasis ([place]) 10/01/2015  . Diverticulosis of colon (without mention of hemorrhage) 07/17/2007  . Gastritis 07/17/2007  . GERD (gastroesophageal reflux disease)   .  Glaucoma   . History of fatty infiltration of liver   . Hyperlipemia   . Hyperparathyroidism ([place])   . Hypertension   . Lumbosacral root lesions, not elsewhere classified 04/13/2014  . Obese   . Osteoarthritis   . Osteoporosis   . PONV (postoperative nausea and vomiting)    Not in last several surgeries  . Renal calculus, right    severe    Past Surgical History:  Procedure Laterality Date  . APPENDECTOMY  1974  . BREAST  BIOPSY  1988   LEFT--BENIGN   . CESAREAN SECTION  1974  . CESAREAN SECTION  1971  . CHOLECYSTECTOMY  2000  . COLONOSCOPY  2008  . ESOPHAGOGASTRODUODENOSCOPY  2008  . EYE SURGERY     Lasik, Cataract, Hx of Glaucoma  . KNEE ARTHROPLASTY  2004   RIGHT-TOTAL   . LUMBAR FUSION  2008  . PARATHYROIDECTOMY  2009   X 2--RIGHT INFERIOR AND LEFT SUPERIOR  . PARATHYROIDECTOMY N/A 01/04/2017   Procedure: RIGHT NECK EXPLORATION WITH PARATHYROIDECTOMY;  Surgeon: Armandina Gemma, MD;  Location: [facility];  Service: General;  Laterality: N/A;  . ROTATOR CUFF REPAIR  2000   RIGHT  . TUBAL LIGATION  1974    Prior to Admission medications   Medication Sig Start Date End Date Taking? Authorizing Provider  Calcium Carbonate-Vitamin D (CALCIUM-VITAMIN D) 500-200 MG-UNIT per tablet Take 1 tablet by mouth every other day.    Yes [provider]  dorzolamide (TRUSOPT) 2 % ophthalmic solution Place 1 drop into both eyes 2 (two) times daily.  11/15/15  Yes [provider]  DULoxetine (CYMBALTA) 30 MG capsule Take 30 mg by mouth daily.   Yes [provider]  esomeprazole (NEXIUM) 40 MG capsule Take 40 mg by mouth daily before breakfast.    Yes [provider]  fluticasone (FLONASE) 50 MCG/ACT nasal spray Place 2 sprays into both nostrils daily. Patient taking differently: Place 2 sprays into both nostrils at bedtime as needed for allergies.  09/01/15  Yes Skeet Latch, MD  gabapentin (NEURONTIN) 100 MG capsule Take 100 mg by mouth at bedtime. 11/21/18  Yes [provider]  ibuprofen (ADVIL,MOTRIN) 200 MG tablet Take 400 mg by mouth 2 (two) times daily.    Yes [provider]  levothyroxine (SYNTHROID) 50 MCG tablet Take 50 mcg by mouth daily. 12/18/18  Yes [provider]  losartan (COZAAR) 100 MG tablet TAKE 1 TABLET EACH DAY. Patient taking differently: Take 100 mg by mouth daily.  07/24/16  Yes Skeet Latch, MD  Multiple Vitamins-Minerals  (PRESERVISION AREDS 2 PO) Take 1 capsule by mouth 2 (two) times daily.   Yes [provider]  ondansetron (ZOFRAN) 4 MG tablet Take 4 mg by mouth 4 (four) times daily as needed for nausea/vomiting. 01/25/19  Yes [provider]  ROCKLATAN 0.02-0.005 % SOLN Place 1 drop into both eyes at bedtime. 01/08/19  Yes [provider]  triamterene-hydrochlorothiazide (MAXZIDE-25) 37.5-25 MG tablet Take 1 tablet by mouth daily. 11/13/15  Yes [provider]  zoledronic acid (RECLAST) 5 MG/100ML SOLN injection Inject 5 mg into the vein yearly. [facility] June 2015)   Yes [provider]    Current Facility-Administered Medications  Medication Dose Route Frequency Provider Last Rate Last Dose  . 0.9 %  sodium chloride infusion   Intravenous Continuous Dessa Phi, DO 100 mL/hr at 01/28/19 2059    . acetaminophen (TYLENOL) tablet 650 mg  650 mg Oral Q6H PRN Dessa Phi, DO  Or  . acetaminophen (TYLENOL) suppository 650 mg  650 mg Rectal Q6H PRN Dessa Phi, DO      . cefTRIAXone (ROCEPHIN) 2 g in sodium chloride 0.9 % 100 mL IVPB  2 g Intravenous Q24H Dessa Phi, DO   Stopped at 01/29/19 0400  . dorzolamide (TRUSOPT) 2 % ophthalmic solution 1 drop  1 drop Both Eyes BID Dessa Phi, DO   1 drop at 01/29/19 0943  . DULoxetine (CYMBALTA) DR capsule 30 mg  30 mg Oral Daily Dessa Phi, DO   30 mg at 01/29/19 0943  . gabapentin (NEURONTIN) capsule 100 mg  100 mg Oral QHS Dessa Phi, DO   100 mg at 01/28/19 2104  . hydrALAZINE (APRESOLINE) injection 5 mg  5 mg Intravenous Q4H PRN Dessa Phi, DO      . HYDROcodone-acetaminophen (NORCO/VICODIN) 5-325 MG per tablet 1-2 tablet  1-2 tablet Oral Q4H PRN Dessa Phi, DO      . levothyroxine (SYNTHROID) tablet 50 mcg  50 mcg Oral QHS Dessa Phi, DO      . metroNIDAZOLE (FLAGYL) IVPB 500 mg  500 mg Intravenous Q8H Dessa Phi, DO 100 mL/hr at 01/29/19 0802 500 mg at 01/29/19 0802  .  Netarsudil-Latanoprost 0.02-0.005 % SOLN 1 drop  1 drop Both Eyes QHS Dessa Phi, DO      . ondansetron [facility]) tablet 4 mg  4 mg Oral Q6H PRN Dessa Phi, DO       Or  . ondansetron [facility]) injection 4 mg  4 mg Intravenous Q6H PRN Dessa Phi, DO      . pantoprazole (PROTONIX) 80 mg in sodium chloride 0.9 % 250 mL (0.32 mg/mL) infusion  8 mg/hr Intravenous Continuous Dessa Phi, DO 25 mL/hr at 01/29/19 0802 8 mg/hr at 01/29/19 0802  . [START ON 02/01/2019] pantoprazole (PROTONIX) injection 40 mg  40 mg Intravenous Q12H Dessa Phi, DO      . sodium chloride flush (NS) 0.9 % injection 3 mL  3 mL Intravenous Once Dessa Phi, DO        Allergies as of 01/28/2019 - Review Complete 01/28/2019  Allergen Reaction Noted  . Codeine Nausea And Vomiting 03/17/2014  . Contrast media [iodinated diagnostic agents] Hives, Shortness Of Breath, and Itching 05/16/2018  . Iohexol Hives and Other (See Comments) 02/21/2007  . Aspirin  06/30/2016  . Fentanyl  10/20/2016    Family History  Problem Relation Age of Onset  . Colon polyps Mother   . Hypertension Mother   . Osteoarthritis Mother   . Diabetes Father   . Stroke Father   . Hernia Father        Esophageal hernia  . Osteoarthritis Maternal Grandmother   . Stroke Maternal Grandfather   . Liver disease Paternal Grandmother   . Stroke Paternal Grandfather     Social History   Socioeconomic History  . Marital status: Married    Spouse name: Not on file  . Number of children: 2  . Years of education: Not on file  . Highest education level: Not on file  Occupational History  . Occupation: Optician, dispensing  Social Needs  . Financial resource strain: Not on file  . Food insecurity:    Worry: Not on file    Inability: Not on file  . Transportation needs:    Medical: Not on file    Non-medical: Not on file  Tobacco Use  . Smoking status: Former Smoker    Packs/day: 0.25    Years: 15.00  Pack years: 3.75    Types:  Cigarettes    Last attempt to quit: 09/01/2014    Years since quitting: 4.4  . Smokeless tobacco: Never Used  Substance and Sexual Activity  . Alcohol use: Yes    Alcohol/week: 1.0 standard drinks    Types: 1 Glasses of wine per week    Comment: ocassional  . Drug use: No  . Sexual activity: Not on file  Lifestyle  . Physical activity:    Days per week: Not on file    Minutes per session: Not on file  . Stress: Not on file  Relationships  . Social connections:    Talks on phone: Not on file    Gets together: Not on file    Attends religious service: Not on file    Active member of club or organization: Not on file    Attends meetings of clubs or organizations: Not on file    Relationship status: Not on file  . Intimate partner violence:    Fear of current or ex partner: Not on file    Emotionally abused: Not on file    Physically abused: Not on file    Forced sexual activity: Not on file  Other Topics Concern  . Not on file  Social History Narrative   Married, with 2 children   Right handed   Some college   No caffeine      Epworth Sleepiness Scale = 7 (as of 07/21/2015)       Review of Systems: All systems reviewed and negative except where noted in HPI.  Physical Exam: Vital signs in last 24 hours: Temp:  [98.1 F (36.7 C)-99 F (37.2 C)] 98.2 F (36.8 C) (04/29 0551) Pulse Rate:  [73-79] 79 (04/29 0551) Resp:  [15-25] 20 (04/29 0551) BP: (109-135)/(43-76) 135/64 (04/29 0551) SpO2:  [92 %-98 %] 97 % (04/29 0551) Weight:  [106.6 kg] 106.6 kg (04/28 1305) Last BM Date: 01/28/19 General:   Alert, white female in NAD Psych:  Pleasant, cooperative. Normal mood and affect. Eyes:  Pupils equal, sclera clear, no icterus.   Conjunctiva pink. Ears:  Normal auditory acuity. Nose:  No deformity, discharge,  or lesions. Neck:  Supple; no masses Lungs:  Clear throughout to auscultation.   No wheezes, crackles, or rhonchi.  Heart:  Regular rate and rhythm; + murmur,  no lower extremity edema Abdomen:  Soft, non-distended, minimal LLQ tenderness, BS active, no palp mass    Rectal:  Deferred  Msk:  Symmetrical without gross deformities. . Neurologic:  Alert and  oriented x4;  grossly normal neurologically. Skin:  Intact without significant lesions or rashes.   Intake/Output from previous day: 04/28 0701 - 04/29 0700 In: 2066.6 [I.V.:866.6; IV Piggyback:1200] Out: -  Intake/Output this shift: Total I/O In: 169.5 [I.V.:69.5; IV Piggyback:100] Out: -   Lab Results: Recent Labs    01/28/19 1339 01/29/19 0619  WBC 11.1* 7.2  HGB 10.8* 10.0*  HCT 36.0 34.3*  PLT 444* 374   BMET Recent Labs    01/28/19 1339 01/29/19 0619  NA 135 139  K 4.3 3.7  CL 100 109  CO2 25 22  GLUCOSE 114* 108*  BUN 27* 23  CREATININE 1.93* 1.79*  CALCIUM 9.9 9.2   LFT Recent Labs    01/28/19 1339  PROT 7.3  ALBUMIN 3.8  AST 20  ALT 18  ALKPHOS 118  BILITOT 0.6   PT/INR No results for input(s): LABPROT, INR in the last 72 hours.  Hepatitis Panel No results for input(s): HEPBSAG, HCVAB, HEPAIGM, HEPBIGM in the last 72 hours.    Studies/Results: Ct Abdomen Pelvis Wo Contrast  Result Date: 01/28/2019 CLINICAL DATA:  Nausea, vomiting, and diarrhea for the past 10 days. EXAM: CT ABDOMEN AND PELVIS WITHOUT CONTRAST TECHNIQUE: Multidetector CT imaging of the abdomen and pelvis was performed following the standard protocol without IV contrast. COMPARISON:  CT abdomen dated September 06, 2009. FINDINGS: Lower chest: No acute abnormality. Several 3-4 mm pulmonary nodules in the right lung base are unchanged since 2010, consistent with benign etiology. Hepatobiliary: No focal liver abnormality is seen. Status post cholecystectomy. Moderate common bile duct and mild central intrahepatic biliary dilatation is likely due to post cholecystectomy state. Pancreas: Unremarkable. No pancreatic ductal dilatation or surrounding inflammatory changes. Spleen: Normal in size  without focal abnormality. Adrenals/Urinary Tract: The adrenal glands are unremarkable. Bilateral renal simple cysts. The dominant right renal cyst has decreased in size, now measuring 3.9 cm, previously 6.3 cm. There is a new 8 mm slightly hyperdense lesion arising from the midpole of the right kidney, too small to characterize, but likely a hemorrhagic/proteinaceous cyst. Bilateral nonobstructive renal calculi again noted. No hydronephrosis. The bladder is unremarkable. Stomach/Bowel: Stomach is within normal limits. No obstruction. Extensive left-sided colonic diverticulosis with mild inflammatory changes surrounding a sigmoid diverticula (series 2, image 66). The appendix is surgically absent. Vascular/Lymphatic: Aortic atherosclerosis. No enlarged abdominal or pelvic lymph nodes. Reproductive: Small calcified posterior fibroid.  No adnexal mass. Other: No abdominal wall hernia or abnormality. No abdominopelvic ascites. No pneumoperitoneum. Musculoskeletal: No acute or significant osseous findings. Prior L2-L5 fusion with prior removal of the L5 screws. Severe degenerative disc disease at T12-L1. IMPRESSION: 1. Findings suspicious for mild sigmoid diverticulitis. No perforation or abscess. 2. Unchanged bilateral nonobstructive nephrolithiasis. 3.  Aortic atherosclerosis (ICD10-I70.0). Electronically Signed   By: Titus Dubin M.D.   On: 01/28/2019 16:50     Tye Savoy, NP-C @  01/29/2019, 10:13 AM    Attending physician's note   I have taken a history, examined the patient and reviewed the chart. I agree with the Advanced Practitioner's note, impression and recommendations.  84 year old female with chronic kidney disease, chronic anemia, iron deficiency admitted with nausea, diarrhea and abdominal pain.  Mild sigmoid diverticulitis on CT abdomen and pelvis History of chronic NSAID use Hemoglobin is at baseline, will continue to monitor Can benefit from IV iron infusion given history of  iron  deficiency If no further significant decline in hemoglobin and no overt GI bleeding, will consider EGD and colonoscopy as outpatient in 4-6 weeks for evaluation of chronic anemia and heme positive stool Continue antibiotics Advance diet tomorrow as tolerated Avoid NSAID's PO PPI daily CBD dilation likely postcholecystectomy, can consider abdominal ultrasound in 3-6 months for follow-up  K. Denzil Magnuson , MD [phone]

## 2019-01-29 NOTE — Progress Notes (Signed)
PROGRESS NOTE    [name]  DGL:[id number] DOB: May 30, 1935 DOA: 01/28/2019 PCP: Burnard Bunting, MD   Brief Narrative: [name] is a 84 y.o. female with medical history significant of hypothyroidism, HTN, diverticulosis, GERD. Patient presented secondary to nausea, vomiting, diarrhea and has evidence of acute sigmoid diverticulitis. Started on antibiotics.   Assessment & Plan:   Principal Problem:   Acute diverticulitis Active Problems:   ANXIETY DEPRESSION   Essential hypertension   Melena   AKI (acute kidney injury) (HCC)   CKD (chronic kidney disease) stage 3, GFR 30-59 ml/min (HCC)   Hypothyroid  Acute sigmoid diverticulitis -Continue ciprofloxacin and flagyl -GI pathogen panel pending  Melena Hemoglobin stable. No BM overnight -GI recommendations  AKI on CKD stage 3 Baseline creatinine of 1.3. Creatinine of 1.93 on admission. Started on IV fluids. On Cozaar and Maxide as an outpatient which were held. Creatinine 1.79 today.  Essential hypertension Normotensive -Continue hydralazine IV prn  Depression -Continue Cymbalta  Hypothyroidism -Continue Synthroid   DVT prophylaxis: SCDs Code Status:   Code Status: Full Code Family Communication: None at bedside Disposition Plan: Discharge likely in 24-48 hours but depending on GI recommendations as well   Consultants:   [facility]  Procedures:   None  Antimicrobials:  Ciprofloxacin  Flagyl    Subjective: Abdominal pain improved from yesterday. No melena overnight.  Objective: Vitals:   01/28/19 1823 01/28/19 1853 01/28/19 2114 01/29/19 0551  BP: (!) 109/45 124/63 (!) 121/56 135/64  Pulse: 74 79 74 79  Resp: 15 16 (!) 21 20  Temp:  98.1 F (36.7 C) 98.1 F (36.7 C) 98.2 F (36.8 C)  TempSrc:  Oral Oral Oral  SpO2: 94% 92% 97% 97%  Weight:      Height:        Intake/Output Summary (Last 24 hours) at 01/29/2019 1627 Last data filed at 01/29/2019 0802 Gross per 24 hour  Intake  1236.04 ml  Output --  Net 1236.04 ml   Filed Weights   01/28/19 1305  Weight: 106.6 kg    Examination:  General exam: Appears calm and comfortable Respiratory system: Clear to auscultation. Respiratory effort normal. Cardiovascular system: S1 & S2 heard, RRR. No murmurs, rubs, gallops or clicks. Gastrointestinal system: Abdomen is nondistended, soft and nontender. No organomegaly or masses felt. Normal bowel sounds heard. Central nervous system: Alert and oriented. No focal neurological deficits. Extremities: No edema. No calf tenderness Skin: No cyanosis. No rashes Psychiatry: Judgement and insight appear normal. Mood & affect appropriate.     Data Reviewed: I have personally reviewed following labs and imaging studies  CBC: Recent Labs  Lab 01/28/19 1339 01/29/19 0619  WBC 11.1* 7.2  HGB 10.8* 10.0*  HCT 36.0 34.3*  MCV 85.9 87.1  PLT 444* 518   Basic Metabolic Panel: Recent Labs  Lab 01/28/19 1339 01/29/19 0619  NA 135 139  K 4.3 3.7  CL 100 109  CO2 25 22  GLUCOSE 114* 108*  BUN 27* 23  CREATININE 1.93* 1.79*  CALCIUM 9.9 9.2   GFR: Estimated Creatinine Clearance: 28.8 mL/min (A) (by C-G formula based on SCr of 1.79 mg/dL (H)). Liver Function Tests: Recent Labs  Lab 01/28/19 1339  AST 20  ALT 18  ALKPHOS 118  BILITOT 0.6  PROT 7.3  ALBUMIN 3.8   Recent Labs  Lab 01/28/19 1339  LIPASE 28   No results for input(s): AMMONIA in the last 168 hours. Coagulation Profile: No results for input(s): INR, PROTIME  in the last 168 hours. Cardiac Enzymes: No results for input(s): CKTOTAL, CKMB, CKMBINDEX, TROPONINI in the last 168 hours. BNP (last 3 results) No results for input(s): PROBNP in the last 8760 hours. HbA1C: No results for input(s): HGBA1C in the last 72 hours. CBG: No results for input(s): GLUCAP in the last 168 hours. Lipid Profile: No results for input(s): CHOL, HDL, LDLCALC, TRIG, CHOLHDL, LDLDIRECT in the last 72 hours. Thyroid  Function Tests: No results for input(s): TSH, T4TOTAL, FREET4, T3FREE, THYROIDAB in the last 72 hours. Anemia Panel: Recent Labs    01/29/19 1121 01/29/19 1122  VITAMINB12 445  --   FOLATE  --  12.9  FERRITIN 14  --   TIBC 381  --   IRON 27*  --   RETICCTPCT  --  1.3   Sepsis Labs: No results for input(s): PROCALCITON, LATICACIDVEN in the last 168 hours.  No results found for this or any previous visit (from the past 240 hour(s)).       Radiology Studies: Ct Abdomen Pelvis Wo Contrast  Result Date: 01/28/2019 CLINICAL DATA:  Nausea, vomiting, and diarrhea for the past 10 days. EXAM: CT ABDOMEN AND PELVIS WITHOUT CONTRAST TECHNIQUE: Multidetector CT imaging of the abdomen and pelvis was performed following the standard protocol without IV contrast. COMPARISON:  CT abdomen dated September 06, 2009. FINDINGS: Lower chest: No acute abnormality. Several 3-4 mm pulmonary nodules in the right lung base are unchanged since 2010, consistent with benign etiology. Hepatobiliary: No focal liver abnormality is seen. Status post cholecystectomy. Moderate common bile duct and mild central intrahepatic biliary dilatation is likely due to post cholecystectomy state. Pancreas: Unremarkable. No pancreatic ductal dilatation or surrounding inflammatory changes. Spleen: Normal in size without focal abnormality. Adrenals/Urinary Tract: The adrenal glands are unremarkable. Bilateral renal simple cysts. The dominant right renal cyst has decreased in size, now measuring 3.9 cm, previously 6.3 cm. There is a new 8 mm slightly hyperdense lesion arising from the midpole of the right kidney, too small to characterize, but likely a hemorrhagic/proteinaceous cyst. Bilateral nonobstructive renal calculi again noted. No hydronephrosis. The bladder is unremarkable. Stomach/Bowel: Stomach is within normal limits. No obstruction. Extensive left-sided colonic diverticulosis with mild inflammatory changes surrounding a sigmoid  diverticula (series 2, image 66). The appendix is surgically absent. Vascular/Lymphatic: Aortic atherosclerosis. No enlarged abdominal or pelvic lymph nodes. Reproductive: Small calcified posterior fibroid.  No adnexal mass. Other: No abdominal wall hernia or abnormality. No abdominopelvic ascites. No pneumoperitoneum. Musculoskeletal: No acute or significant osseous findings. Prior L2-L5 fusion with prior removal of the L5 screws. Severe degenerative disc disease at T12-L1. IMPRESSION: 1. Findings suspicious for mild sigmoid diverticulitis. No perforation or abscess. 2. Unchanged bilateral nonobstructive nephrolithiasis. 3.  Aortic atherosclerosis (ICD10-I70.0). Electronically Signed   By: Titus Dubin M.D.   On: 01/28/2019 16:50        Scheduled Meds:  dorzolamide  1 drop Both Eyes BID   DULoxetine  30 mg Oral Daily   gabapentin  100 mg Oral QHS   levothyroxine  50 mcg Oral QHS   Netarsudil-Latanoprost  1 drop Both Eyes QHS   [START ON 02/01/2019] pantoprazole  40 mg Intravenous Q12H   sodium chloride flush  3 mL Intravenous Once   Continuous Infusions:  sodium chloride 100 mL/hr at 01/28/19 2059   cefTRIAXone (ROCEPHIN)  IV Stopped (01/29/19 0400)   metronidazole 500 mg (01/29/19 1603)   pantoprozole (PROTONIX) infusion 8 mg/hr (01/29/19 1228)     LOS: 1 day  Cordelia Poche, MD [facility] 01/29/2019, 4:27 PM  If 7PM-7AM, please contact night-coverage [email or web address]

## 2019-01-30 ENCOUNTER — Telehealth: Payer: Self-pay

## 2019-01-30 ENCOUNTER — Inpatient Hospital Stay (HOSPITAL_COMMUNITY): Payer: Medicare Other

## 2019-01-30 ENCOUNTER — Other Ambulatory Visit: Payer: Self-pay | Admitting: Nurse Practitioner

## 2019-01-30 DIAGNOSIS — D649 Anemia, unspecified: Secondary | ICD-10-CM

## 2019-01-30 DIAGNOSIS — D509 Iron deficiency anemia, unspecified: Secondary | ICD-10-CM

## 2019-01-30 DIAGNOSIS — R195 Other fecal abnormalities: Secondary | ICD-10-CM

## 2019-01-30 DIAGNOSIS — D500 Iron deficiency anemia secondary to blood loss (chronic): Secondary | ICD-10-CM

## 2019-01-30 LAB — BASIC METABOLIC PANEL
Anion gap: 6 (ref 5–15)
BUN: 16 mg/dL (ref 8–23)
CO2: 23 mmol/L (ref 22–32)
Calcium: 9 mg/dL (ref 8.9–10.3)
Chloride: 109 mmol/L (ref 98–111)
Creatinine, Ser: 1.45 mg/dL — ABNORMAL HIGH (ref 0.44–1.00)
GFR calc Af Amer: 39 mL/min — ABNORMAL LOW (ref >60)
GFR calc non Af Amer: 34 mL/min — ABNORMAL LOW (ref >60)
Glucose, Bld: 152 mg/dL — ABNORMAL HIGH (ref 70–99)
Potassium: 3.6 mmol/L (ref 3.5–5.1)
Sodium: 138 mmol/L (ref 135–145)

## 2019-01-30 LAB — CBC
HCT: 32.4 % — ABNORMAL LOW (ref 36.0–46.0)
Hemoglobin: 9.7 g/dL — ABNORMAL LOW (ref 12.0–15.0)
MCH: 31.4 pg (ref 26.0–34.0)
MCHC: 29.9 g/dL — ABNORMAL LOW (ref 30.0–36.0)
MCV: 104.9 fL — ABNORMAL HIGH (ref 80.0–100.0)
Platelets: 325 10*3/uL (ref 150–400)
RBC: 3.09 MIL/uL — ABNORMAL LOW (ref 3.87–5.11)
RDW: 16.9 % — ABNORMAL HIGH (ref 11.5–15.5)
WBC: 6.7 10*3/uL (ref 4.0–10.5)
nRBC: 0 % (ref 0.0–0.2)

## 2019-01-30 MED ORDER — METRONIDAZOLE 500 MG PO TABS: 500.0000 mg | ORAL_TABLET | Freq: Three times a day (TID) | ORAL | 0 refills | Status: AC

## 2019-01-30 MED ORDER — ALBUTEROL SULFATE (2.5 MG/3ML) 0.083% IN NEBU
2.5000 mg | INHALATION_SOLUTION | Freq: Four times a day (QID) | RESPIRATORY_TRACT | Status: DC
  Administered 2019-01-30: 2.5 mg via RESPIRATORY_TRACT
  Filled 2019-01-30: qty 3

## 2019-01-30 MED ORDER — CEFDINIR 300 MG PO CAPS: 300.0000 mg | ORAL_CAPSULE | Freq: Two times a day (BID) | ORAL | 0 refills | Status: AC

## 2019-01-30 MED ORDER — AEROCHAMBER PLUS FLO-VU MISC
1.0000 | Freq: Once | Status: DC
  Filled 2019-01-30: qty 1

## 2019-01-30 MED ORDER — SODIUM CHLORIDE 0.9 % IV SOLN
510.0000 mg | Freq: Once | INTRAVENOUS | Status: AC
  Administered 2019-01-30: 510 mg via INTRAVENOUS
  Filled 2019-01-30: qty 17

## 2019-01-30 MED ORDER — ALBUTEROL SULFATE HFA 108 (90 BASE) MCG/ACT IN AERS: 2.0000 | INHALATION_SPRAY | Freq: Four times a day (QID) | RESPIRATORY_TRACT | Status: DC

## 2019-01-30 MED ORDER — ALBUTEROL SULFATE HFA 108 (90 BASE) MCG/ACT IN AERS: 2.0000 | INHALATION_SPRAY | Freq: Four times a day (QID) | RESPIRATORY_TRACT | 2 refills | Status: DC | PRN

## 2019-01-30 NOTE — Discharge Instructions (Signed)
[name],  You were in the hospital for some GI symptoms which resolved but you were found to have diverticulitis. This was treated with antibiotics, for which you will continue. The gastroenterologist was also consulted and recommends outpatient follow-up. You received IV fluids because of some kidney injury which is improved. You received IV iron prior to discharge because of low iron levels. Please follow-up with your GI doctor

## 2019-01-30 NOTE — Progress Notes (Addendum)
Progress Note    ASSESSMENT AND PLAN:    47. 84 year old female with several days of intermittent nausea, vomiting, diarrhea and lower abdominal pain.  CT scan suggesting mild sigmoid diverticulitis.  -Afebrile, WBC normal.  -continue antibiotics -diet advancement.  -Eventual outpatient colonoscopy, her last one was in 2008  2.  IDA /  Black stools on bismuth / FOBT+.  Hgb 9.7, after IVF and this is about where it was earlier this month. CKD likely contributing to chronic anemia but she does take NSAIDS  -Will order IV iron -Continue PPI upon discharge.  -Following iron infusion patient is stable for discharge from GI standpoint. -CBC at our office in one week (lab in basement) -our office will call her to arrange for hospital follow up. She will be scheduled for EGD / colonoscopy  -Hold home NSAIDS until after outpatient EGD  2. CBD and intrahepatic biliary duct dilation favored to be secondary to postcholecystectomy state.  Her liver tests are normal .  -Outpatient follow up, possible abd u/s in 3-6 months  3. Cough. Developed in hospital. Sometimes productive (clear). Afebrile, normal WBC. Coarse breath sounds and some wheezing bilaterally.  -will order CXR. Hopefully results will be available by time TR[facility] rounds.   4. AKI on CKD, Cr improving  SUBJECTIVE    developed a cough. No SOB. No further N/V/D. Abdominal pain resolved.   OBJECTIVE:     Vital signs in last 24 hours: Temp:  [97.9 F (36.6 C)-98 F (36.7 C)] 97.9 F (36.6 C) (04/30 0514) Pulse Rate:  [61-72] 72 (04/30 0514) Resp:  [18-20] 18 (04/30 0514) BP: (106-120)/(53-55) 120/53 (04/30 0514) SpO2:  [91 %-98 %] 98 % (04/30 0514) Last BM Date: 01/28/19 General:   Alert, female in NAD EENT:  Normal hearing, non icteric sclera, conjunctive pink.  Heart:  Regular rate and rhythm; no murmur.  No lower extremity edema   Pulm: Normal respiratory effort, coarse breath sounds / some wheezing bilaterally.   Abdomen:  Soft, nondistended, nontender.  Normal bowel sounds,.       Neurologic:  Alert and  oriented x4;  grossly normal neurologically. Psych:  Pleasant, cooperative.  Normal mood and affect.   Intake/Output from previous day: 04/29 0701 - 04/30 0700 In: 2066.4 [I.V.:1659.9; IV Piggyback:406.5] Out: -  Intake/Output this shift: No intake/output data recorded.  Lab Results: Recent Labs    01/28/19 1339 01/29/19 0619 01/30/19 0500  WBC 11.1* 7.2 6.7  HGB 10.8* 10.0* 9.7*  HCT 36.0 34.3* 32.4*  PLT 444* 374 325   BMET Recent Labs    01/28/19 1339 01/29/19 0619  NA 135 139  K 4.3 3.7  CL 100 109  CO2 25 22  GLUCOSE 114* 108*  BUN 27* 23  CREATININE 1.93* 1.79*  CALCIUM 9.9 9.2   LFT Recent Labs    01/28/19 1339  PROT 7.3  ALBUMIN 3.8  AST 20  ALT 18  ALKPHOS 118  BILITOT 0.6   PT/INR No results for input(s): LABPROT, INR in the last 72 hours. Hepatitis Panel No results for input(s): HEPBSAG, HCVAB, HEPAIGM, HEPBIGM in the last 72 hours.  Ct Abdomen Pelvis Wo Contrast  Result Date: 01/28/2019 CLINICAL DATA:  Nausea, vomiting, and diarrhea for the past 10 days. EXAM: CT ABDOMEN AND PELVIS WITHOUT CONTRAST TECHNIQUE: Multidetector CT imaging of the abdomen and pelvis was performed following the standard protocol without IV contrast. COMPARISON:  CT abdomen dated September 06, 2009. FINDINGS: Lower chest: No acute abnormality.  Several 3-4 mm pulmonary nodules in the right lung base are unchanged since 2010, consistent with benign etiology. Hepatobiliary: No focal liver abnormality is seen. Status post cholecystectomy. Moderate common bile duct and mild central intrahepatic biliary dilatation is likely due to post cholecystectomy state. Pancreas: Unremarkable. No pancreatic ductal dilatation or surrounding inflammatory changes. Spleen: Normal in size without focal abnormality. Adrenals/Urinary Tract: The adrenal glands are unremarkable. Bilateral renal simple cysts.  The dominant right renal cyst has decreased in size, now measuring 3.9 cm, previously 6.3 cm. There is a new 8 mm slightly hyperdense lesion arising from the midpole of the right kidney, too small to characterize, but likely a hemorrhagic/proteinaceous cyst. Bilateral nonobstructive renal calculi again noted. No hydronephrosis. The bladder is unremarkable. Stomach/Bowel: Stomach is within normal limits. No obstruction. Extensive left-sided colonic diverticulosis with mild inflammatory changes surrounding a sigmoid diverticula (series 2, image 66). The appendix is surgically absent. Vascular/Lymphatic: Aortic atherosclerosis. No enlarged abdominal or pelvic lymph nodes. Reproductive: Small calcified posterior fibroid.  No adnexal mass. Other: No abdominal wall hernia or abnormality. No abdominopelvic ascites. No pneumoperitoneum. Musculoskeletal: No acute or significant osseous findings. Prior L2-L5 fusion with prior removal of the L5 screws. Severe degenerative disc disease at T12-L1. IMPRESSION: 1. Findings suspicious for mild sigmoid diverticulitis. No perforation or abscess. 2. Unchanged bilateral nonobstructive nephrolithiasis. 3.  Aortic atherosclerosis (ICD10-I70.0). Electronically Signed   By: Titus Dubin M.D.   On: 01/28/2019 16:50     Principal Problem:   Acute diverticulitis Active Problems:   ANXIETY DEPRESSION   Essential hypertension   Melena   AKI (acute kidney injury) ([place])   CKD (chronic kidney disease) stage 3, GFR 30-59 ml/min ([place])   Hypothyroid     LOS: 2 days   Tye Savoy ,NP 01/30/2019, 9:55 AM   Attending physician's note   I have taken an interval history, reviewed the chart and examined the patient. I agree with the Advanced Practitioner's note, impression and recommendations.   Advance diet IV iron infusion Avoid NSAID's Will plan follow up office visit in 2-3 weeks and EGD /colonoscopy in 6-8 weeks as outpatient F/u CBC in 2 weeks by PMD OK to discharge  home from GI standpoint  K. Denzil Magnuson , MD [phone]

## 2019-01-30 NOTE — Telephone Encounter (Signed)
-----   Message from Willia Craze, NP sent at 01/30/2019 10:11 AM EDT ----- Eustaquio Maize,  Please make patient hosp follow up (anemia / fobt + / diverticulitis) with Nandigam in 2-3 weeks. I put a CBC in Epic for her in one week. Thanks

## 2019-01-30 NOTE — Progress Notes (Signed)
Pharmacy: iron replacement  Patient's an 84 y.o F with hx CKD presented to the ED on 4/28 with c/o n/v/d and black stools.  Per GI, to replete with IV iron  Anemia panel on 4/29:  - iron low 27 - TIBC 381 - sat ratio low 7 - ferritin 14 - folate and vit B 12 wnl  - Hgb 9.7  Plan: - feraheme 510 mg IV x1 today (already ordered by Dr. Teryl Lucy) - will plan on ordering a second dose on 02/02/19 if patient is still at [facility], PharmD, BCPS 01/30/2019 12:58 PM

## 2019-01-30 NOTE — Discharge Summary (Signed)
Physician Discharge Summary  [name] FKC:[id number] DOB: 14-Apr-1935 DOA: 01/28/2019  PCP: Burnard Bunting, MD  Admit date: 01/28/2019 Discharge date: 01/30/2019  Admitted From: Home Disposition: Home  Recommendations for Outpatient Follow-up:  1. Follow up with PCP in 1 week 2. Please obtain BMP/CBC in one week 3. Please follow up on the following pending results: None  Home Health: None Equipment/Devices: None  Discharge Condition: Stable CODE STATUS: Full code Diet recommendation: Low-fiber   Brief/Interim Summary:  Admission HPI written by Dessa Phi, DO   Chief Complaint: Diarrhea  HPI: [name] is a 84 y.o. female with medical history significant of hypothyroidism, HTN, diverticulosis, GERD, who presents with over a week of nausea, vomiting, diarrhea. She initially thought it was food poisoning after she had some takeout. She had fish and slaw, her husband did not have the slaw. She has lost her appetite and vomiting stopped about 3 days ago. She continues to have diarrhea, no bright red blood but the stool has been black. She took OTC anti-diarrheal without relief. No fevers, chills, nightsweats, chest pain, shortness of breath, new cough. She does admit to lower abdominal cramping.    ED Course: CT A/P revealed diverticulitis. FOBT positive. GI consulted. Cipro/flagyl and IVF started. GI PCR ordered and pending.    Hospital course:  Acute sigmoid diverticulitis Started on ceftriaxone and flagyl. Discharged on Cefdinir and Flagyl for a total course of 10 days. GI pathogen ordered but no sample produced. Test discontinued prior to discharge.  Melena Hemoglobin stable. No BM overnight. GI recommending outpatient follow-up. Outpatient CBC.  AKI on CKD stage 3 Baseline creatinine of 1.3. Creatinine of 1.93 on admission. Started on IV fluids. On Cozaar and Maxide as an outpatient which were held. Creatinine 1.79 today.  Essential hypertension  Normotensive. Continue hydralazine IV prn  Depression Continue Cymbalta  Hypothyroidism Continue Synthroid  Discharge Diagnoses:  Principal Problem:   Acute diverticulitis Active Problems:   ANXIETY DEPRESSION   Essential hypertension   Melena   AKI (acute kidney injury) ([facility])   CKD (chronic kidney disease) stage 3, GFR 30-59 ml/min ([facility])   Hypothyroid    Discharge Instructions  Discharge Instructions    Diet - low sodium heart healthy   Complete by:  As directed      Allergies as of 01/30/2019      Reactions   Codeine Nausea And Vomiting   Contrast Media [iodinated Diagnostic Agents] Hives, Shortness Of Breath, Itching   Iohexol Hives, Other (See Comments)   REACTION: " TROUBLE BREATHING"   Aspirin    In high doses bleeding   Fentanyl    Altered mental state      Medication List    STOP taking these medications   ibuprofen 200 MG tablet Commonly known as:  ADVIL     TAKE these medications   albuterol 108 (90 Base) MCG/ACT inhaler Commonly known as:  VENTOLIN HFA Inhale 2 puffs into the lungs every 6 (six) hours as needed for wheezing or shortness of breath.   calcium-vitamin D 500-200 MG-UNIT tablet Take 1 tablet by mouth every other day.   cefdinir 300 MG capsule Commonly known as:  OMNICEF Take 1 capsule (300 mg total) by mouth 2 (two) times daily for 8 days.   dorzolamide 2 % ophthalmic solution Commonly known as:  TRUSOPT Place 1 drop into both eyes 2 (two) times daily.   DULoxetine 30 MG capsule Commonly known as:  CYMBALTA Take 30 mg by mouth  daily.   esomeprazole 40 MG capsule Commonly known as:  NEXIUM Take 40 mg by mouth daily before breakfast.   fluticasone 50 MCG/ACT nasal spray Commonly known as:  Flonase Place 2 sprays into both nostrils daily. What changed:    when to take this  reasons to take this   gabapentin 100 MG capsule Commonly known as:  NEURONTIN Take 100 mg by mouth at bedtime.   levothyroxine 50 MCG  tablet Commonly known as:  SYNTHROID Take 50 mcg by mouth daily.   losartan 100 MG tablet Commonly known as:  COZAAR TAKE 1 TABLET EACH DAY. What changed:  See the new instructions.   metroNIDAZOLE 500 MG tablet Commonly known as:  Flagyl Take 1 tablet (500 mg total) by mouth 3 (three) times daily for 8 days.   ondansetron 4 MG tablet Commonly known as:  ZOFRAN Take 4 mg by mouth 4 (four) times daily as needed for nausea/vomiting.   PRESERVISION AREDS 2 PO Take 1 capsule by mouth 2 (two) times daily.   Reclast 5 MG/100ML Soln injection Generic drug:  zoledronic acid Inject 5 mg into the vein yearly. (Start June 2015)   Rocklatan 0.02-0.005 % Soln Generic drug:  Netarsudil-Latanoprost Place 1 drop into both eyes at bedtime.   triamterene-hydrochlorothiazide 37.5-25 MG tablet Commonly known as:  MAXZIDE-25 Take 1 tablet by mouth daily.      Follow-up Information    Burnard Bunting, MD. Schedule an appointment as soon as possible for a visit in 1 week(s).   Specialty:  Internal Medicine Contact information: [place] [address] [phone]        Mauri Pole, MD. Schedule an appointment as soon as possible for a visit.   Specialty:  Gastroenterology Contact information: [place] [address] [phone]          Allergies  Allergen Reactions  . Codeine Nausea And Vomiting  . Contrast Media [Iodinated Diagnostic Agents] Hives, Shortness Of Breath and Itching  . Iohexol Hives and Other (See Comments)    REACTION: " TROUBLE BREATHING"  . Aspirin     In high doses bleeding  . Fentanyl     Altered mental state    Consultations:   GI   Procedures/Studies: Ct Abdomen Pelvis Wo Contrast  Result Date: 01/28/2019 CLINICAL DATA:  Nausea, vomiting, and diarrhea for the past 10 days. EXAM: CT ABDOMEN AND PELVIS WITHOUT CONTRAST TECHNIQUE: Multidetector CT imaging of the abdomen and pelvis was performed  following the standard protocol without IV contrast. COMPARISON:  CT abdomen dated September 06, 2009. FINDINGS: Lower chest: No acute abnormality. Several 3-4 mm pulmonary nodules in the right lung base are unchanged since 2010, consistent with benign etiology. Hepatobiliary: No focal liver abnormality is seen. Status post cholecystectomy. Moderate common bile duct and mild central intrahepatic biliary dilatation is likely due to post cholecystectomy state. Pancreas: Unremarkable. No pancreatic ductal dilatation or surrounding inflammatory changes. Spleen: Normal in size without focal abnormality. Adrenals/Urinary Tract: The adrenal glands are unremarkable. Bilateral renal simple cysts. The dominant right renal cyst has decreased in size, now measuring 3.9 cm, previously 6.3 cm. There is a new 8 mm slightly hyperdense lesion arising from the midpole of the right kidney, too small to characterize, but likely a hemorrhagic/proteinaceous cyst. Bilateral nonobstructive renal calculi again noted. No hydronephrosis. The bladder is unremarkable. Stomach/Bowel: Stomach is within normal limits. No obstruction. Extensive left-sided colonic diverticulosis with mild inflammatory changes surrounding a sigmoid diverticula (series 2, image  66). The appendix is surgically absent. Vascular/Lymphatic: Aortic atherosclerosis. No enlarged abdominal or pelvic lymph nodes. Reproductive: Small calcified posterior fibroid.  No adnexal mass. Other: No abdominal wall hernia or abnormality. No abdominopelvic ascites. No pneumoperitoneum. Musculoskeletal: No acute or significant osseous findings. Prior L2-L5 fusion with prior removal of the L5 screws. Severe degenerative disc disease at T12-L1. IMPRESSION: 1. Findings suspicious for mild sigmoid diverticulitis. No perforation or abscess. 2. Unchanged bilateral nonobstructive nephrolithiasis. 3.  Aortic atherosclerosis (ICD10-I70.0). Electronically Signed   By: Titus Dubin M.D.   On:  01/28/2019 16:50   Dg Chest 2 View  Result Date: 01/30/2019 CLINICAL DATA:  Cough. EXAM: CHEST - 2 VIEW COMPARISON:  January 01, 2017 FINDINGS: The heart size and mediastinal contours are within normal limits. Both lungs are clear. The visualized skeletal structures are unremarkable. IMPRESSION: No active cardiopulmonary disease. Electronically Signed   By: Dorise Bullion III M.D   On: 01/30/2019 11:33      Subjective: No issues overnight. Feeling better.  Discharge Exam: Vitals:   01/30/19 1402 01/30/19 1632  BP: (!) 153/65   Pulse: 72   Resp: 20   Temp: 97.8 F (36.6 C)   SpO2: 100% 100%   Vitals:   01/29/19 2002 01/30/19 0514 01/30/19 1402 01/30/19 1632  BP: (!) 106/55 (!) 120/53 (!) 153/65   Pulse: 61 72 72   Resp: 20 18 20    Temp: 98 F (36.7 C) 97.9 F (36.6 C) 97.8 F (36.6 C)   TempSrc: Oral Oral    SpO2: 91% 98% 100% 100%  Weight:      Height:        General: Pt is alert, awake, not in acute distress Cardiovascular: RRR, S1/S2 +, no rubs, no gallops Respiratory: CTA bilaterally, no wheezing, no rhonchi Abdominal: Soft, NT, ND, bowel sounds + Extremities: no edema, no cyanosis    The results of significant diagnostics from this hospitalization (including imaging, microbiology, ancillary and laboratory) are listed below for reference.     Microbiology: No results found for this or any previous visit (from the past 240 hour(s)).   Labs: BNP (last 3 results) No results for input(s): BNP in the last 8760 hours. Basic Metabolic Panel: Recent Labs  Lab 01/28/19 1339 01/29/19 0619 01/30/19 0928  NA 135 139 138  K 4.3 3.7 3.6  CL 100 109 109  CO2 25 22 23   GLUCOSE 114* 108* 152*  BUN 27* 23 16  CREATININE 1.93* 1.79* 1.45*  CALCIUM 9.9 9.2 9.0   Liver Function Tests: Recent Labs  Lab 01/28/19 1339  AST 20  ALT 18  ALKPHOS 118  BILITOT 0.6  PROT 7.3  ALBUMIN 3.8   Recent Labs  Lab 01/28/19 1339  LIPASE 28   No results for input(s):  AMMONIA in the last 168 hours. CBC: Recent Labs  Lab 01/28/19 1339 01/29/19 0619 01/30/19 0500  WBC 11.1* 7.2 6.7  HGB 10.8* 10.0* 9.7*  HCT 36.0 34.3* 32.4*  MCV 85.9 87.1 104.9*  PLT 444* 374 325   Cardiac Enzymes: No results for input(s): CKTOTAL, CKMB, CKMBINDEX, TROPONINI in the last 168 hours. BNP: Invalid input(s): POCBNP CBG: No results for input(s): GLUCAP in the last 168 hours. D-Dimer No results for input(s): DDIMER in the last 72 hours. Hgb A1c No results for input(s): HGBA1C in the last 72 hours. Lipid Profile No results for input(s): CHOL, HDL, LDLCALC, TRIG, CHOLHDL, LDLDIRECT in the last 72 hours. Thyroid function studies No results for input(s): TSH, T4TOTAL,  T3FREE, THYROIDAB in the last 72 hours.  Invalid input(s): FREET3 Anemia work up Recent Labs    01/29/19 1121 01/29/19 1122  VITAMINB12 445  --   FOLATE  --  12.9  FERRITIN 14  --   TIBC 381  --   IRON 27*  --   RETICCTPCT  --  1.3   Urinalysis    Component Value Date/Time   COLORURINE STRAW (A) 01/28/2019 1646   APPEARANCEUR CLEAR 01/28/2019 1646   LABSPEC 1.006 01/28/2019 1646   PHURINE 5.0 01/28/2019 1646   GLUCOSEU NEGATIVE 01/28/2019 1646   HGBUR NEGATIVE 01/28/2019 1646   BILIRUBINUR NEGATIVE 01/28/2019 1646   KETONESUR NEGATIVE 01/28/2019 1646   PROTEINUR NEGATIVE 01/28/2019 1646   UROBILINOGEN 0.2 06/01/2010 0915   NITRITE NEGATIVE 01/28/2019 1646   LEUKOCYTESUR NEGATIVE 01/28/2019 1646    SIGNED:   Cordelia Poche, MD [facility] 01/30/2019, 4:36 PM

## 2019-01-30 NOTE — Telephone Encounter (Signed)
Patient is still in the hospital. Letter mailed to her home with her appointment information.

## 2019-02-07 ENCOUNTER — Telehealth: Payer: Self-pay | Admitting: Gastroenterology

## 2019-02-07 NOTE — Telephone Encounter (Signed)
The pt wanted to update Dr Silverio Decamp that she is doing ok but she still has some gas and bloating.  Her stools are still somewhat loose and gassy.  She will finish her abx, use gas ex as needed and stay on a clear to full liquid diet and advance as tolerated. She will call on Monday to update or go to the ED if she develops severe pain, bleeding, fever, or worsening symptoms.  I will forward to Dr Silverio Decamp for review on Monday.

## 2019-02-10 NOTE — Telephone Encounter (Signed)
Ok, she is already scheduled for follow up visit later this week. It will take few days to get back to baseline bowel habits. Complete the antibiotics course, follow up as scheduled. Thanks

## 2019-02-12 ENCOUNTER — Encounter: Payer: Self-pay | Admitting: General Surgery

## 2019-02-13 ENCOUNTER — Other Ambulatory Visit: Payer: Medicare Other

## 2019-02-13 ENCOUNTER — Other Ambulatory Visit: Payer: Self-pay

## 2019-02-13 ENCOUNTER — Ambulatory Visit (INDEPENDENT_AMBULATORY_CARE_PROVIDER_SITE_OTHER): Payer: Medicare Other | Admitting: Gastroenterology

## 2019-02-13 ENCOUNTER — Encounter: Payer: Self-pay | Admitting: Gastroenterology

## 2019-02-13 VITALS — Ht 63.0 in | Wt 229.0 lb

## 2019-02-13 DIAGNOSIS — R143 Flatulence: Secondary | ICD-10-CM

## 2019-02-13 DIAGNOSIS — R140 Abdominal distension (gaseous): Secondary | ICD-10-CM

## 2019-02-13 DIAGNOSIS — R197 Diarrhea, unspecified: Secondary | ICD-10-CM

## 2019-02-13 DIAGNOSIS — Z8719 Personal history of other diseases of the digestive system: Secondary | ICD-10-CM | POA: Diagnosis not present

## 2019-02-13 MED ORDER — SACCHAROMYCES BOULARDII 250 MG PO CAPS: 250.0000 mg | ORAL_CAPSULE | Freq: Two times a day (BID) | ORAL | 0 refills | Status: DC

## 2019-02-13 NOTE — Progress Notes (Signed)
[name]    [id number]    1935-05-09  Primary Care Physician:Aronson, Delfino Lovett, MD  Referring Physician: Burnard Bunting, MD [address] [place], [place] [address]  This service was provided via audio only telephone due to [place] 19 pandemic.  Patient location: Home Provider location: Office Used 2 patient identifiers to confirm the correct person. Explained the limitations in evaluation and management via telemedicine. Patient is aware of potential medical charges for this visit.  Patient consented to this virtual visit.  The persons participating in this telemedicine service were myself and the patient  Interactive audio and video telecommunications were attempted between this provider and patient, however failed, due to patient having technical difficulties OR patient did not have access to video capability. We continued and completed visit with audio only.    Chief complaint: History of diverticulitis  HPI:  84 year old female with history of hypertension, hypothyroidism, GERD and diverticulosis.  Recently hospitalized with acute uncomplicated diverticulitis.  She was treated with IV antibiotics and discharged home on cefdinir and Flagyl to complete the antibiotic course. She was having diarrhea after going home, has improved in the last few days.  GI pathogen panel was ordered in hospitalization but given she did not have any bowel movement, it was not collected. Continues to have nausea and significant abdominal bloating with a lot of gas. She stopped taking antibiotics as it was causing her stomach upset.  Denies any abdominal pain.  No rectal bleeding or blood in stool. No fever, chills or vomiting.  Her appetite is still decreased.   Outpatient Encounter Medications as of 02/13/2019  Medication Sig  . albuterol (VENTOLIN HFA) 108 (90 Base) MCG/ACT inhaler Inhale 2 puffs into the lungs every 6 (six) hours as needed for wheezing or shortness of breath.   . Calcium Carbonate-Vitamin D (CALCIUM-VITAMIN D) 500-200 MG-UNIT per tablet Take 1 tablet by mouth every other day.   . dorzolamide (TRUSOPT) 2 % ophthalmic solution Place 1 drop into both eyes 2 (two) times daily.   . DULoxetine (CYMBALTA) 30 MG capsule Take 30 mg by mouth daily.  Marland Kitchen esomeprazole (NEXIUM) 40 MG capsule Take 40 mg by mouth daily before breakfast.   . fluticasone (FLONASE) 50 MCG/ACT nasal spray Place 2 sprays into both nostrils daily. (Patient taking differently: Place 2 sprays into both nostrils at bedtime as needed for allergies. )  . gabapentin (NEURONTIN) 100 MG capsule Take 100 mg by mouth at bedtime.  Marland Kitchen levothyroxine (SYNTHROID) 50 MCG tablet Take 50 mcg by mouth daily.  Marland Kitchen losartan (COZAAR) 100 MG tablet TAKE 1 TABLET EACH DAY. (Patient taking differently: Take 100 mg by mouth daily. )  . metroNIDAZOLE (FLAGYL) 500 MG tablet Take 500 mg by mouth 3 (three) times daily.  . Multiple Vitamins-Minerals (PRESERVISION AREDS 2 PO) Take 1 capsule by mouth 2 (two) times daily.  . ondansetron (ZOFRAN) 4 MG tablet Take 4 mg by mouth 4 (four) times daily as needed for nausea/vomiting.  Marland Kitchen ROCKLATAN 0.02-0.005 % SOLN Place 1 drop into both eyes at bedtime.  . triamterene-hydrochlorothiazide (MAXZIDE-25) 37.5-25 MG tablet Take 1 tablet by mouth daily.  . zoledronic acid (RECLAST) 5 MG/100ML SOLN injection Inject 5 mg into the vein yearly. (Start June 2015)   No facility-administered encounter medications on file as of 02/13/2019.     Allergies as of 02/13/2019 - Review Complete 02/12/2019  Allergen Reaction Noted  . Codeine Nausea And Vomiting 03/17/2014  . Contrast media [  iodinated diagnostic agents] Hives, Shortness Of Breath, and Itching 05/16/2018  . Iohexol Hives and Other (See Comments) 02/21/2007  . Aspirin  06/30/2016  . Fentanyl  10/20/2016    Past Medical History:  Diagnosis Date  . Bronchiectasis ([place]) 10/01/2015  . Diverticulosis of colon (without mention of  hemorrhage) 07/17/2007  . Gastritis 07/17/2007  . GERD (gastroesophageal reflux disease)   . Glaucoma   . History of fatty infiltration of liver   . Hyperlipemia   . Hyperparathyroidism ([place])   . Hypertension   . Lumbosacral root lesions, not elsewhere classified 04/13/2014  . Obese   . Osteoarthritis   . Osteoporosis   . PONV (postoperative nausea and vomiting)    Not in last several surgeries  . Renal calculus, right    severe    Past Surgical History:  Procedure Laterality Date  . APPENDECTOMY  1974  . BREAST BIOPSY  1988   LEFT--BENIGN   . CESAREAN SECTION  1974  . CESAREAN SECTION  1971  . CHOLECYSTECTOMY  2000  . COLONOSCOPY  2008  . ESOPHAGOGASTRODUODENOSCOPY  2008  . EYE SURGERY     Lasik, Cataract, Hx of Glaucoma  . KNEE ARTHROPLASTY  2004   RIGHT-TOTAL   . LUMBAR FUSION  2008  . PARATHYROIDECTOMY  2009   X 2--RIGHT INFERIOR AND LEFT SUPERIOR  . PARATHYROIDECTOMY N/A 01/04/2017   Procedure: RIGHT NECK EXPLORATION WITH PARATHYROIDECTOMY;  Surgeon: Armandina Gemma, MD;  Location: [facility];  Service: General;  Laterality: N/A;  . ROTATOR CUFF REPAIR  2000   RIGHT  . TUBAL LIGATION  1974    Family History  Problem Relation Age of Onset  . Colon polyps Mother   . Hypertension Mother   . Osteoarthritis Mother   . Diabetes Father   . Stroke Father   . Hernia Father        Esophageal hernia  . Osteoarthritis Maternal Grandmother   . Stroke Maternal Grandfather   . Liver disease Paternal Grandmother   . Stroke Paternal Grandfather     Social History   Socioeconomic History  . Marital status: Married    Spouse name: Not on file  . Number of children: 2  . Years of education: Not on file  . Highest education level: Not on file  Occupational History  . Occupation: Optician, dispensing  Social Needs  . Financial resource strain: Not on file  . Food insecurity:    Worry: Not on file    Inability: Not on file  . Transportation needs:    Medical: Not on file     Non-medical: Not on file  Tobacco Use  . Smoking status: Former Smoker    Packs/day: 0.25    Years: 15.00    Pack years: 3.75    Types: Cigarettes    Last attempt to quit: 09/01/2014    Years since quitting: 4.4  . Smokeless tobacco: Never Used  Substance and Sexual Activity  . Alcohol use: Yes    Alcohol/week: 1.0 standard drinks    Types: 1 Glasses of wine per week    Comment: ocassional  . Drug use: No  . Sexual activity: Not on file  Lifestyle  . Physical activity:    Days per week: Not on file    Minutes per session: Not on file  . Stress: Not on file  Relationships  . Social connections:    Talks on phone: Not on file    Gets together: Not on file  Attends religious service: Not on file    Active member of club or organization: Not on file    Attends meetings of clubs or organizations: Not on file    Relationship status: Not on file  . Intimate partner violence:    Fear of current or ex partner: Not on file    Emotionally abused: Not on file    Physically abused: Not on file    Forced sexual activity: Not on file  Other Topics Concern  . Not on file  Social History Narrative   Married, with 2 children   Right handed   Some college   No caffeine      Epworth Sleepiness Scale = 7 (as of 07/21/2015)         Review of systems: Review of Systems as per HPI All other systems reviewed and are negative.   Physical Exam: Vitals were not taken and physical exam was not performed during this virtual visit.  Data Reviewed:  Reviewed labs, radiology imaging, old records and pertinent past GI work up   Assessment and Plan/Recommendations:  84 year old female with hypothyroidism, hypertension, recent hospitalization with acute sigmoid diverticulitis, uncomplicated  Diarrhea after hospitalization, possible antibiotic associated, is improving Advised patient to come in for stool study to check C. difficile if she develops recurrent diarrhea  Complete the  course of antibiotics metronidazole and cefdinir  Start probiotic Florastor after completion of antibiotics  Follow-up telemedicine visit in 2 weeks    K. Denzil Magnuson , MD   CC: Burnard Bunting, MD

## 2019-02-13 NOTE — Patient Instructions (Addendum)
Check C. Difficile ( Please come to [address] Basement level to pick up your lab kit test)   Complete the course of metronidazole  Send prescription for Florastor 1 capsule twice daily for 21 days ( Sent prescription to your pharmacy)  Follow-up telemedicine visit in 2 weeks   Call with any change or worsening symptoms  I appreciate the  opportunity to care for you  Thank You   Kavitha Nandigam , MD  

## 2019-02-25 ENCOUNTER — Encounter: Payer: Self-pay | Admitting: Gastroenterology

## 2019-02-25 ENCOUNTER — Other Ambulatory Visit: Payer: Self-pay | Admitting: *Deleted

## 2019-02-25 ENCOUNTER — Telehealth: Payer: Self-pay | Admitting: Gastroenterology

## 2019-02-25 ENCOUNTER — Telehealth (INDEPENDENT_AMBULATORY_CARE_PROVIDER_SITE_OTHER): Payer: Medicare Other | Admitting: Gastroenterology

## 2019-02-25 DIAGNOSIS — K5793 Diverticulitis of intestine, part unspecified, without perforation or abscess with bleeding: Secondary | ICD-10-CM

## 2019-02-25 DIAGNOSIS — K921 Melena: Secondary | ICD-10-CM

## 2019-02-25 MED ORDER — METRONIDAZOLE 500 MG PO TABS: 500.0000 mg | ORAL_TABLET | Freq: Three times a day (TID) | ORAL | 0 refills | Status: DC

## 2019-02-25 MED ORDER — GABAPENTIN 100 MG PO CAPS
100.00 | ORAL_CAPSULE | ORAL | Status: DC
Start: ? — End: 2019-02-25

## 2019-02-25 MED ORDER — DORZOLAMIDE HCL 2 % OP SOLN
1.00 | OPHTHALMIC | Status: DC
Start: 2019-02-24 — End: 2019-02-25

## 2019-02-25 MED ORDER — ACETAMINOPHEN 325 MG PO TABS
650.00 | ORAL_TABLET | ORAL | Status: DC
Start: ? — End: 2019-02-25

## 2019-02-25 MED ORDER — ONDANSETRON 4 MG PO TBDP
4.00 | ORAL_TABLET | ORAL | Status: DC
Start: ? — End: 2019-02-25

## 2019-02-25 MED ORDER — CEFDINIR 300 MG PO CAPS: 300.0000 mg | ORAL_CAPSULE | Freq: Two times a day (BID) | ORAL | 0 refills | Status: DC

## 2019-02-25 MED ORDER — BIMATOPROST 0.01 % OP SOLN
1.00 | OPHTHALMIC | Status: DC
Start: 2019-02-24 — End: 2019-02-25

## 2019-02-25 MED ORDER — PREDNISONE 20 MG PO TABS
40.00 | ORAL_TABLET | ORAL | Status: DC
Start: 2019-02-24 — End: 2019-02-25

## 2019-02-25 NOTE — Telephone Encounter (Signed)
Spoke to the patients husband who reports the patient was admitted to the [facility] in [place], [place] on 5/23 (for 2 nights) for "not feeling good." The patient was reportedly on a boat with her family when she became increasingly "incoherent," febrile at 101.0 F, developed a "strong" cough, weakness and frank red blood was seen by the husband after cleaning out the commode (x1 known episode). EMS was called, the patient was then taken to the hospital (small and isolated) where she was dx with diverticulitis but was admitted to the ICU for isolation and tested for COVID-19 due to her symptoms and presentation. The patient was discharged, results of the COVID-19 test are not known at this time. The husband reports the patient was discharged with a prednisone prescription (take two 20 mg tabs BID x 5 days). The husband reports the patient has had continued abd pain but no bleeding (although he suspects the patient does not report any episode of rectal bleeding or diarrhea). There is a telephone appt scheduled on 03/03/2019 but the husband is adamant that the patient needs to be seen earlier and in person. This RN told the husband that we needed to know the results of her COVID-19 test due to the symptoms he reported because seeing her in person could possibly put other patients and staff at risk for transmission of the virus. The husband understands this but still wants the patient to be seen in person. The husband also would like a call from Dr. Silverio Decamp today. Please advise.

## 2019-02-25 NOTE — Telephone Encounter (Signed)
Pt  Husband called in wanting to provide the medical records number to [place] in [place] to the doctor. The number is [phone]

## 2019-02-25 NOTE — Telephone Encounter (Signed)
Noted  

## 2019-02-25 NOTE — Telephone Encounter (Signed)
Pt's husband reported that pt was in the ED at [facility] in [place], [place] for diverticulitis.  Husband is concerned that pt is still passing blood in stool.  Pt already scheduled for appt 03/03/19 but would like a call to advise.

## 2019-02-25 NOTE — Telephone Encounter (Signed)
The patient has a contrast allergy, do you want to continue with a CT of the abd and pelvis without contrast?

## 2019-02-25 NOTE — Telephone Encounter (Signed)
Left message to call back  

## 2019-02-25 NOTE — Telephone Encounter (Signed)
We can schedule CT abd & pelvis at the time of tele visit next week, based on her clinical course. I sent the antibiotic Rx to her pharmacy.Thanks

## 2019-02-25 NOTE — Telephone Encounter (Signed)
noted 

## 2019-02-25 NOTE — Telephone Encounter (Signed)
Patient was admitted to [facility].  CT abdomen pelvis showed sigmoid diverticulitis with no abscess or perforation. ?  Inflammation in cecum Treated with IV Cipro and Flagyl and also started on steroids for?  Inflammatory bowel disease  Patient was discharged home on oral steroids but no antibiotics  She has persistent left lower quadrant abdominal pain.  Decreased appetite and diarrhea  COVID 19 test pending  Restart antibiotics.  Still has leftover antibiotics from last hospitalization as she did not complete the course. Plan to do 10-day course of cefdinir twice daily and metronidazole 3 times daily .   Follow-up telemedicine visit next week.  Advised patient to call with any worsening symptoms, need to come to the ER if pain significantly worse.  We will plan to repeat CT abdomen pelvis in 2-4 weeks to document resolution of diverticulitis.  Time spent:24 minutes

## 2019-02-26 ENCOUNTER — Other Ambulatory Visit: Payer: Self-pay | Admitting: Adult Health

## 2019-02-28 ENCOUNTER — Telehealth: Payer: Self-pay

## 2019-02-28 NOTE — Telephone Encounter (Signed)
Okay thank you [name]

## 2019-02-28 NOTE — Telephone Encounter (Signed)
Dr. Silverio Decamp, I just triaged patient for Monday phone visit.  In talking with her, she is having diarrhea and nausea on Flagyl and Omnicef. Husband is concerned about these symptoms with weekend coming up.  Please advise

## 2019-02-28 NOTE — Telephone Encounter (Signed)
Dr. Silverio Decamp, called back and spoke with patient and her daughter.  The patient has only had one episode of "diarrhea" which patient states not watery just a semi-formed stool.  Has not at this time had another episode. Per your instruction patient was advised to continue antibiotics and probiotic if only a couple episodes of diarrhea.  Daughter wanted to confirm dose and frequency which was clarified.  Thanks, Peter Congo

## 2019-03-03 ENCOUNTER — Other Ambulatory Visit: Payer: Self-pay

## 2019-03-03 ENCOUNTER — Encounter: Payer: Self-pay | Admitting: Gastroenterology

## 2019-03-03 ENCOUNTER — Ambulatory Visit (INDEPENDENT_AMBULATORY_CARE_PROVIDER_SITE_OTHER): Payer: Medicare Other | Admitting: Gastroenterology

## 2019-03-03 VITALS — Ht 63.0 in | Wt 233.0 lb

## 2019-03-03 DIAGNOSIS — Z8719 Personal history of other diseases of the digestive system: Secondary | ICD-10-CM

## 2019-03-03 DIAGNOSIS — R197 Diarrhea, unspecified: Secondary | ICD-10-CM

## 2019-03-03 DIAGNOSIS — R110 Nausea: Secondary | ICD-10-CM | POA: Diagnosis not present

## 2019-03-03 NOTE — Progress Notes (Signed)
[name]    [id number]    January 16, 1935  Primary Care Physician:Aronson, Delfino Lovett, MD  Referring Physician: Burnard Bunting, MD [address] [place], [place] [address]  This service was provided via audio and video telemedicine (Doximity) due to [place] 19 pandemic.  Patient location: Home Provider location: Office Used 2 patient identifiers to confirm the correct person. Explained the limitations in evaluation and management via telemedicine. Patient is aware of potential medical charges for this visit.  Patient consented to this virtual visit.  The persons participating in this telemedicine service were myself and the patient   Chief complaint: Sigmoid diverticulitis, diarrhea HPI: 84 year old female status post recent hospitalization at [facility] long April 28 to 30th, 2020 and subsequently Feb 22, 2019 at [facility] in [place] , [place] with acute sigmoid diverticulitis.  Completed course of antibiotics, no longer has left-sided abdominal pain.  Her appetite is still significantly decreased, has intermittent nausea worse when she wakes up in the morning. She is having loose stool about once a day, no increased frequency or nocturnal diarrhea. No vomiting, mucus or blood per rectum     Outpatient Encounter Medications as of 03/03/2019  Medication Sig  . albuterol (VENTOLIN HFA) 108 (90 Base) MCG/ACT inhaler Inhale 2 puffs into the lungs every 6 (six) hours as needed for wheezing or shortness of breath.  . benzonatate (TESSALON) 100 MG capsule Take by mouth 3 (three) times daily as needed for cough.  . cefdinir (OMNICEF) 300 MG capsule Take 1 capsule (300 mg total) by mouth 2 (two) times daily.  . dorzolamide (TRUSOPT) 2 % ophthalmic solution Place 1 drop into both eyes 2 (two) times daily.   . DULoxetine (CYMBALTA) 30 MG capsule Take 30 mg by mouth daily.  Marland Kitchen esomeprazole (NEXIUM) 40 MG capsule Take 40 mg by mouth daily before breakfast.   .  fluticasone (FLONASE) 50 MCG/ACT nasal spray Place 2 sprays into both nostrils daily. (Patient taking differently: Place 2 sprays into both nostrils at bedtime as needed for allergies. )  . gabapentin (NEURONTIN) 100 MG capsule Take 100 mg by mouth at bedtime.  Marland Kitchen levothyroxine (SYNTHROID) 50 MCG tablet Take 50 mcg by mouth daily.  Marland Kitchen losartan (COZAAR) 100 MG tablet TAKE 1 TABLET EACH DAY. (Patient taking differently: Take 100 mg by mouth daily. )  . metroNIDAZOLE (FLAGYL) 500 MG tablet Take 1 tablet (500 mg total) by mouth 3 (three) times daily.  . Multiple Vitamins-Minerals (PRESERVISION AREDS 2 PO) Take 1 capsule by mouth 2 (two) times daily.  . ondansetron (ZOFRAN) 4 MG tablet Take 4 mg by mouth 4 (four) times daily as needed for nausea/vomiting.  Marland Kitchen ROCKLATAN 0.02-0.005 % SOLN Place 1 drop into both eyes at bedtime.  . saccharomyces boulardii (FLORASTOR) 250 MG capsule Take 1 capsule (250 mg total) by mouth 2 (two) times daily. 1 twice daily for 21 days  . triamterene-hydrochlorothiazide (MAXZIDE-25) 37.5-25 MG tablet Take 1 tablet by mouth as directed. One whole tablet daily alternating with 1/2 tablet daily.  . zoledronic acid (RECLAST) 5 MG/100ML SOLN injection Inject 5 mg into the vein yearly. (Start June 2015)   No facility-administered encounter medications on file as of 03/03/2019.     Allergies as of 03/03/2019 - Review Complete 02/28/2019  Allergen Reaction Noted  . Codeine Nausea And Vomiting 03/17/2014  . Contrast media [iodinated diagnostic agents] Hives, Shortness Of Breath, and Itching 05/16/2018  . Iohexol Hives and Other (See Comments) 02/21/2007  .  Aspirin  06/30/2016  . Fentanyl  10/20/2016    Past Medical History:  Diagnosis Date  . Bronchiectasis ([place]) 10/01/2015  . Diverticulosis of colon (without mention of hemorrhage) 07/17/2007  . Gastritis 07/17/2007  . GERD (gastroesophageal reflux disease)   . Glaucoma   . History of fatty infiltration of liver   .  Hyperlipemia   . Hyperparathyroidism ([place])   . Hypertension   . Lumbosacral root lesions, not elsewhere classified 04/13/2014  . Obese   . Osteoarthritis   . Osteoporosis   . PONV (postoperative nausea and vomiting)    Not in last several surgeries  . Renal calculus, right    severe    Past Surgical History:  Procedure Laterality Date  . APPENDECTOMY  1974  . BREAST BIOPSY  1988   LEFT--BENIGN   . CESAREAN SECTION  1974  . CESAREAN SECTION  1971  . CHOLECYSTECTOMY  2000  . COLONOSCOPY  2008  . ESOPHAGOGASTRODUODENOSCOPY  2008  . EYE SURGERY     Lasik, Cataract, Hx of Glaucoma  . KNEE ARTHROPLASTY  2004   RIGHT-TOTAL   . LUMBAR FUSION  2008  . PARATHYROIDECTOMY  2009   X 2--RIGHT INFERIOR AND LEFT SUPERIOR  . PARATHYROIDECTOMY N/A 01/04/2017   Procedure: RIGHT NECK EXPLORATION WITH PARATHYROIDECTOMY;  Surgeon: Armandina Gemma, MD;  Location: [facility];  Service: General;  Laterality: N/A;  . ROTATOR CUFF REPAIR  2000   RIGHT  . TUBAL LIGATION  1974    Family History  Problem Relation Age of Onset  . Colon polyps Mother   . Hypertension Mother   . Osteoarthritis Mother   . Diabetes Father   . Stroke Father   . Hernia Father        Esophageal hernia  . Osteoarthritis Maternal Grandmother   . Stroke Maternal Grandfather   . Liver disease Paternal Grandmother   . Stroke Paternal Grandfather     Social History   Socioeconomic History  . Marital status: Married    Spouse name: Not on file  . Number of children: 2  . Years of education: Not on file  . Highest education level: Not on file  Occupational History  . Occupation: retired  Scientific laboratory technician  . Financial resource strain: Not on file  . Food insecurity:    Worry: Not on file    Inability: Not on file  . Transportation needs:    Medical: Not on file    Non-medical: Not on file  Tobacco Use  . Smoking status: Former Smoker    Packs/day: 0.25    Years: 15.00    Pack years: 3.75    Types: Cigarettes    Last  attempt to quit: 09/01/2014    Years since quitting: 4.5  . Smokeless tobacco: Never Used  Substance and Sexual Activity  . Alcohol use: Yes    Alcohol/week: 1.0 standard drinks    Types: 1 Glasses of wine per week    Comment: ocassional  . Drug use: No  . Sexual activity: Not on file  Lifestyle  . Physical activity:    Days per week: Not on file    Minutes per session: Not on file  . Stress: Not on file  Relationships  . Social connections:    Talks on phone: Not on file    Gets together: Not on file    Attends religious service: Not on file    Active member of club or organization: Not on file    Attends  meetings of clubs or organizations: Not on file    Relationship status: Not on file  . Intimate partner violence:    Fear of current or ex partner: Not on file    Emotionally abused: Not on file    Physically abused: Not on file    Forced sexual activity: Not on file  Other Topics Concern  . Not on file  Social History Narrative   Married, with 2 children   Right handed   Some college   No caffeine      Epworth Sleepiness Scale = 7 (as of 07/21/2015)         Review of systems: Review of Systems as per HPI All other systems reviewed and are negative.   Physical Exam: Vitals were not taken and physical exam was not performed during this virtual visit.  Data Reviewed:  Reviewed labs, radiology imaging, old records and pertinent past GI work up   Assessment and Plan/Recommendations: 84 year old female with history of hypothyroidism, hypertension, recent hospitalization X with sigmoid diverticulitis uncomplicated Completed second course of antibiotics with improvement of abdominal pain Continue daily probiotic for additional 2 to 4 weeks Small frequent meals, slowly advance diet as tolerated Maintain adequate hydration, increase water intake to 8 cups daily Follow-up telemedicine visit in 2 weeks    K. Denzil Magnuson , MD   CC: Burnard Bunting, MD

## 2019-03-03 NOTE — Patient Instructions (Addendum)
Small frequent meals  Maintain adequate hydration  Continue probiotic Florastor for additional 2 weeks  Follow-up telemedicine visit in 2 weeks  I appreciate the  opportunity to care for you  Thank You   Harl Bowie , MD

## 2019-03-14 ENCOUNTER — Telehealth: Payer: Self-pay | Admitting: Gastroenterology

## 2019-03-14 NOTE — Telephone Encounter (Signed)
Not having any diverticulitis symptoms. Not eating as well as the family feels she should. She says she will"be sick." Spouse says "but she can sure eat the heck out of some ice cream." The PCP has been contacted. He is looking to addressing any psychological factors as well.  Very kind man. He is hopeful you can talk to her about diet?

## 2019-03-14 NOTE — Telephone Encounter (Signed)
Patient husband called in requesting a call back from [place]. He stated that he needs to discuss his wife current situation with the doctor and wants a call back.

## 2019-03-14 NOTE — Telephone Encounter (Signed)
Advised. Virtual visit is 03/20/19. Nutritional intake will be a part of the conversation.

## 2019-03-14 NOTE — Telephone Encounter (Signed)
Brittani please see below thanks

## 2019-03-14 NOTE — Telephone Encounter (Signed)
Yes, please get her in for virtual visit

## 2019-03-14 NOTE — Telephone Encounter (Signed)
Left a message to call back to discuss and I will send the information to the doctor.

## 2019-03-14 NOTE — Telephone Encounter (Signed)
Patient return call. ?

## 2019-03-20 ENCOUNTER — Telehealth: Payer: Self-pay | Admitting: *Deleted

## 2019-03-20 ENCOUNTER — Encounter: Payer: Self-pay | Admitting: Gastroenterology

## 2019-03-20 ENCOUNTER — Ambulatory Visit (INDEPENDENT_AMBULATORY_CARE_PROVIDER_SITE_OTHER): Payer: Medicare Other | Admitting: Gastroenterology

## 2019-03-20 VITALS — Ht 63.0 in | Wt 233.0 lb

## 2019-03-20 DIAGNOSIS — K589 Irritable bowel syndrome without diarrhea: Secondary | ICD-10-CM

## 2019-03-20 NOTE — Telephone Encounter (Signed)
We were trying to get in touch with patient for her appointment today, left voicemails with no return call  I finally reached the husband today he did not want the telephone visit today because he was in [facility] and his wife is at home.... He wants his wife to have an in office appointment only.  I scheduled the patient for an in office appointment on 04/01/2019 at 4 pm patients husband agreed with with this appointment but stated he would like patient to be sooner if possible. Pt is seeing her PCP today at 2 pm per husband, . Per Dr Silverio Decamp patient is fine to wait until June 30 th for her appointment but I will keep a check for any cancellations for the patient

## 2019-03-20 NOTE — Telephone Encounter (Signed)
Dr Silverio Decamp patients husband is wanting a diet for nutrient supplements diet  mailed to him  For his wife......Marland Kitchen What do you recommend I mail out to him

## 2019-03-24 NOTE — Telephone Encounter (Signed)
Please send high-protein low calorie diet instructions.  Follow-up office visit as already scheduled

## 2019-03-24 NOTE — Progress Notes (Signed)
Patient reschedule appointment

## 2019-03-25 NOTE — Telephone Encounter (Signed)
Will send High Protein diet out today

## 2019-03-31 ENCOUNTER — Telehealth: Payer: Self-pay | Admitting: *Deleted

## 2019-03-31 NOTE — Telephone Encounter (Signed)
Covid-19 screening questions   Do you now or have you had a fever in the last 14 days? NO   Do you have any respiratory symptoms of shortness of breath or cough now or in the last 14 days? NO  Do you have any family members or close contacts with diagnosed or suspected Covid-19 in the past 14 days? NO  Have you been tested for Covid-19 and found to be positive? NO        

## 2019-04-01 ENCOUNTER — Ambulatory Visit (INDEPENDENT_AMBULATORY_CARE_PROVIDER_SITE_OTHER): Payer: Medicare Other | Admitting: Gastroenterology

## 2019-04-01 ENCOUNTER — Encounter: Payer: Self-pay | Admitting: Gastroenterology

## 2019-04-01 VITALS — BP 122/74 | HR 76 | Temp 96.1°F | Wt 219.0 lb

## 2019-04-01 DIAGNOSIS — R110 Nausea: Secondary | ICD-10-CM

## 2019-04-01 DIAGNOSIS — K589 Irritable bowel syndrome without diarrhea: Secondary | ICD-10-CM

## 2019-04-01 NOTE — Progress Notes (Signed)
[name]    [id number]    08/20/1935  Primary Care Physician:Aronson, Delfino Lovett, MD  Referring Physician: Burnard Bunting, MD [address] [place],  [place] [address]   Chief complaint: Loss of appetite  HPI:  84 year old female status post recurrent sigmoid diverticulitis, treated with IV and oral antibiotics, uncomplicated.  She was hospitalized at [facility] April 28-30 and at [facility] in [place], [place] on Feb 22, 2019. She no longer has abdominal pain.  Her bowel habits are getting back to baseline.  She continues to have decreased appetite, is afraid to try anything other than bland diet, and is worried that it may trigger an episode of diverticulitis.  According to husband she has no interest in doing any activity and gets tired very easily.  He thinks she is getting more depressed, had medication changes last week with Dr. Reynaldo Minium and he thinks her depression is somewhat better.   Outpatient Encounter Medications as of 04/01/2019  Medication Sig  . albuterol (VENTOLIN HFA) 108 (90 Base) MCG/ACT inhaler Inhale 2 puffs into the lungs every 6 (six) hours as needed for wheezing or shortness of breath.  . benzonatate (TESSALON) 100 MG capsule Take by mouth 3 (three) times daily as needed for cough.  . dorzolamide (TRUSOPT) 2 % ophthalmic solution Place 1 drop into both eyes 2 (two) times daily.   . fluticasone (FLONASE) 50 MCG/ACT nasal spray Place 2 sprays into both nostrils daily. (Patient taking differently: Place 2 sprays into both nostrils at bedtime as needed for allergies. )  . gabapentin (NEURONTIN) 100 MG capsule Take 100 mg by mouth at bedtime.  Marland Kitchen levothyroxine (SYNTHROID) 50 MCG tablet Take 50 mcg by mouth daily.  Marland Kitchen losartan (COZAAR) 100 MG tablet TAKE 1 TABLET EACH DAY. (Patient taking differently: Take 100 mg by mouth daily. )  . metroNIDAZOLE (FLAGYL) 500 MG tablet Take 1 tablet (500 mg total) by mouth 3 (three) times daily.   . Multiple Vitamins-Minerals (PRESERVISION AREDS 2 PO) Take 1 capsule by mouth 2 (two) times daily.  . ondansetron (ZOFRAN) 4 MG tablet Take 4 mg by mouth 4 (four) times daily as needed for nausea/vomiting.  Marland Kitchen PROTONIX 40 MG tablet   . ROCKLATAN 0.02-0.005 % SOLN Place 1 drop into both eyes at bedtime.  . saccharomyces boulardii (FLORASTOR) 250 MG capsule Take 1 capsule (250 mg total) by mouth 2 (two) times daily. 1 twice daily for 21 days  . sertraline (ZOLOFT) 50 MG tablet   . triamterene-hydrochlorothiazide (MAXZIDE-25) 37.5-25 MG tablet Take 1 tablet by mouth as directed. One whole tablet daily alternating with 1/2 tablet daily.  . zoledronic acid (RECLAST) 5 MG/100ML SOLN injection Inject 5 mg into the vein yearly. [place] June 2015)  . cefdinir (OMNICEF) 300 MG capsule Take 1 capsule (300 mg total) by mouth 2 (two) times daily. (Patient not taking: Reported on 04/01/2019)  . DULoxetine (CYMBALTA) 30 MG capsule Take 30 mg by mouth daily.  Marland Kitchen esomeprazole (NEXIUM) 40 MG capsule Take 40 mg by mouth daily before breakfast.    No facility-administered encounter medications on file as of 04/01/2019.     Allergies as of 04/01/2019 - Review Complete 04/01/2019  Allergen Reaction Noted  . Codeine Nausea And Vomiting 03/17/2014  . Contrast media [iodinated diagnostic agents] Hives, Shortness Of Breath, and Itching 05/16/2018  . Iohexol Hives and Other (See Comments) 02/21/2007  . Aspirin  06/30/2016  . Fentanyl  10/20/2016  Past Medical History:  Diagnosis Date  . Bronchiectasis ([place]) 10/01/2015  . Diverticulosis of colon (without mention of hemorrhage) 07/17/2007  . Gastritis 07/17/2007  . GERD (gastroesophageal reflux disease)   . Glaucoma   . History of fatty infiltration of liver   . Hyperlipemia   . Hyperparathyroidism ([place])   . Hypertension   . Lumbosacral root lesions, not elsewhere classified 04/13/2014  . Obese   . Osteoarthritis   . Osteoporosis   . PONV (postoperative  nausea and vomiting)    Not in last several surgeries  . Renal calculus, right    severe    Past Surgical History:  Procedure Laterality Date  . APPENDECTOMY  1974  . BREAST BIOPSY  1988   LEFT--BENIGN   . CESAREAN SECTION  1974  . CESAREAN SECTION  1971  . CHOLECYSTECTOMY  2000  . COLONOSCOPY  2008  . ESOPHAGOGASTRODUODENOSCOPY  2008  . EYE SURGERY     Lasik, Cataract, Hx of Glaucoma  . KNEE ARTHROPLASTY  2004   RIGHT-TOTAL   . LUMBAR FUSION  2008  . PARATHYROIDECTOMY  2009   X 2--RIGHT INFERIOR AND LEFT SUPERIOR  . PARATHYROIDECTOMY N/A 01/04/2017   Procedure: RIGHT NECK EXPLORATION WITH PARATHYROIDECTOMY;  Surgeon: Armandina Gemma, MD;  Location: [facility];  Service: General;  Laterality: N/A;  . ROTATOR CUFF REPAIR  2000   RIGHT  . TUBAL LIGATION  1974    Family History  Problem Relation Age of Onset  . Colon polyps Mother   . Hypertension Mother   . Osteoarthritis Mother   . Diabetes Father   . Stroke Father   . Hernia Father        Esophageal hernia  . Osteoarthritis Maternal Grandmother   . Stroke Maternal Grandfather   . Liver disease Paternal Grandmother   . Stroke Paternal Grandfather     Social History   Socioeconomic History  . Marital status: Married    Spouse name: Not on file  . Number of children: 2  . Years of education: Not on file  . Highest education level: Not on file  Occupational History  . Occupation: retired  Scientific laboratory technician  . Financial resource strain: Not on file  . Food insecurity    Worry: Not on file    Inability: Not on file  . Transportation needs    Medical: Not on file    Non-medical: Not on file  Tobacco Use  . Smoking status: Former Smoker    Packs/day: 0.25    Years: 15.00    Pack years: 3.75    Types: Cigarettes    Quit date: 09/01/2014    Years since quitting: 4.5  . Smokeless tobacco: Never Used  Substance and Sexual Activity  . Alcohol use: Yes    Alcohol/week: 1.0 standard drinks    Types: 1 Glasses of wine per  week    Comment: ocassional  . Drug use: No  . Sexual activity: Not on file  Lifestyle  . Physical activity    Days per week: Not on file    Minutes per session: Not on file  . Stress: Not on file  Relationships  . Social Herbalist on phone: Not on file    Gets together: Not on file    Attends religious service: Not on file    Active member of club or organization: Not on file    Attends meetings of clubs or organizations: Not on file    Relationship status:  Not on file  . Intimate partner violence    Fear of current or ex partner: Not on file    Emotionally abused: Not on file    Physically abused: Not on file    Forced sexual activity: Not on file  Other Topics Concern  . Not on file  Social History Narrative   Married, with 2 children   Right handed   Some college   No caffeine      Epworth Sleepiness Scale = 7 (as of 07/21/2015)         Review of systems: Review of Systems  Constitutional: Negative for fever and chills.  HENT: Negative.   Eyes: Negative for blurred vision.  Respiratory: Negative for cough, shortness of breath and wheezing.   Cardiovascular: Negative for chest pain and palpitations.  Gastrointestinal: as per HPI Genitourinary: Negative for dysuria, urgency, frequency and hematuria.  Musculoskeletal: Positive for myalgias, back pain and joint pain.  Skin: Negative for itching and rash.  Neurological: Negative for dizziness, tremors, focal weakness, seizures and loss of consciousness.  Endo/Heme/Allergies: Negative for seasonal allergies.  Psychiatric/Behavioral: Positive for depression, negative for suicidal ideas and hallucinations.  All other systems reviewed and are negative.   Physical Exam: Vitals:   04/01/19 1559  BP: 122/74  Pulse: 76  Temp: (!) 96.1 F (35.6 C)  SpO2: 96%   Body mass index is 38.79 kg/m. Gen:      No acute distress HEENT:  EOMI, sclera anicteric Neck:     No masses; no thyromegaly Lungs:    Clear  to auscultation bilaterally; normal respiratory effort CV:         Regular rate and rhythm; no murmurs Abd:      + bowel sounds; soft, non-tender; no palpable masses, no distension Ext:    No edema; adequate peripheral perfusion Skin:      Warm and dry; no rash Neuro: alert and oriented x 3 Psych: normal mood and affect  Data Reviewed:  Reviewed labs, radiology imaging, old records and pertinent past GI work up   Assessment and Plan/Recommendations:  84 year old female with history of hypothyroidism, hypertension, recent hospitalizationsX 2 with recurrent sigmoid diverticulitis Decreased appetite likely multifactorial Depression is being managed by Dr. Reynaldo Minium Encouraged patient to eat small frequent meals, drink energy protein shakes in between meals Stop probiotics, completed 4 weeks course Reassured her that there is no scientific data to link any specific diet to recurrent episodes of diverticulitis.  Advised her to advance diet as tolerated   25 minutes was spent face-to-face with the patient. Greater than 50% of the time used for counseling as well as treatment plan and follow-up. She had multiple questions which were answered to her satisfaction  K. Denzil Magnuson , MD    CC: Burnard Bunting, MD

## 2019-04-01 NOTE — Patient Instructions (Signed)
If you are age 82 or older, your body mass index should be between 23-30. Your Body mass index is 38.79 kg/m. If this is out of the aforementioned range listed, please consider follow up with your Primary Care Provider.  If you are age 26 or younger, your body mass index should be between 19-25. Your Body mass index is 38.79 kg/m. If this is out of the aformentioned range listed, please consider follow up with your Primary Care Provider.  To help prevent the possible spread of infection to our patients, communities, and staff; we will be implementing the following measures:  As of now we are not allowing any visitors/family members to accompany you to any upcoming appointments with [facility]. If you have any concerns about this please contact our office to discuss prior to the appointment.   Stop taking Florastor.  Eat small frequent meals.  Follow up in 3 months with a telephone visit

## 2019-04-08 ENCOUNTER — Encounter: Payer: Self-pay | Admitting: Gastroenterology

## 2019-04-15 ENCOUNTER — Telehealth: Payer: Self-pay | Admitting: Gastroenterology

## 2019-04-15 NOTE — Telephone Encounter (Signed)
Pt's husband called to inform that pt has been nauseous for days.  Please advise.

## 2019-04-16 NOTE — Telephone Encounter (Signed)
Husband called back concerning wife's nausea.

## 2019-04-16 NOTE — Telephone Encounter (Signed)
ok 

## 2019-04-16 NOTE — Telephone Encounter (Signed)
Patient has off and on nausea. Yesterday she actually did vomit.  Today she complains of nausea but has not had any vomiting. She has Zofran. Spouse says she has gotten up today and is bathing which is because he "made her." No new medication or medication changes. No diet changes. She still eats only bland. She is now drinking protein supplements. He will call us if she acutely worsens or fails to improve.

## 2019-04-21 NOTE — Telephone Encounter (Signed)
Spoke with the spouse. Understands the plan of care. EGD next week. Pre-visit by phone tomorrow.

## 2019-04-21 NOTE — Telephone Encounter (Signed)
Please advise patient to take Zofran 4 mg as needed as needed daily.  Small frequent meals.  Avoid high fat diet. Schedule for EGD at [facility] next available appointment.

## 2019-04-21 NOTE — Telephone Encounter (Signed)
Pt's husband called to report that symptoms are continuing and requested a call back

## 2019-04-21 NOTE — Telephone Encounter (Signed)
Spoke with the spouse. She is vomiting up undigested food. I asked him to keep her NPO until I talked with you. Very recent vomiting.

## 2019-04-22 ENCOUNTER — Ambulatory Visit: Payer: Medicare Other | Admitting: *Deleted

## 2019-04-22 ENCOUNTER — Other Ambulatory Visit: Payer: Self-pay

## 2019-04-22 VITALS — Ht 63.5 in | Wt 219.0 lb

## 2019-04-22 DIAGNOSIS — R112 Nausea with vomiting, unspecified: Secondary | ICD-10-CM

## 2019-04-22 NOTE — Progress Notes (Signed)
No egg or soy allergy known to patient   issues with past sedation with any surgeries  or procedures- PONV , no intubation problems  No diet pills per patient No home 02 use per patient  No blood thinners per patient  Pt denies issues with constipation  No A fib or A flutter  EMMI video sent to pt's e mail  Husband on speaker phone and assisted with Pv today   Pt verified name, DOB, address and insurance during PV today. Pt mailed instruction packet to included paper to complete and mail back to [facility] with addressed and stamped envelope, Emmi video, copy of consent form to read and not return, and instructions.PV completed over the phone. Pt encouraged to call with questions or issues   Pt is aware that care partner will wait in the car during procedure; if they feel like they will be too hot to wait in the car; they may wait in the lobby.  We want them to wear a mask (we do not have any that we can provide them), practice social distancing, and we will check their temperatures when they get here.  I did remind patient that their care partner needs to stay in the parking lot the entire time. Pt will wear mask into building.

## 2019-04-25 ENCOUNTER — Telehealth: Payer: Self-pay | Admitting: Gastroenterology

## 2019-04-25 NOTE — Telephone Encounter (Signed)
Spoke with patient's husband regarding Covid-19 screening questions. Covid-19 Screening Questions:  Do you now or have you had a fever in the last 14 days? no  Do you have any respiratory symptoms of shortness of breath or cough now or in the last 14 days? no  Do you have any family members or close contacts with diagnosed or suspected Covid-19 in the past 14 days? no  Have you been tested for Covid-19 and found to be positive?  Yes, Negative  Pt made aware of that care partner may wait in the car or come up to the lobby during the procedure but will need to provide their own mask.

## 2019-04-28 ENCOUNTER — Encounter: Payer: Self-pay | Admitting: Gastroenterology

## 2019-04-28 ENCOUNTER — Ambulatory Visit (AMBULATORY_SURGERY_CENTER): Payer: Medicare Other | Admitting: Gastroenterology

## 2019-04-28 ENCOUNTER — Other Ambulatory Visit: Payer: Self-pay

## 2019-04-28 VITALS — BP 76/54 | HR 80 | Temp 97.5°F | Resp 26 | Ht 63.0 in | Wt 219.0 lb

## 2019-04-28 DIAGNOSIS — R110 Nausea: Secondary | ICD-10-CM

## 2019-04-28 DIAGNOSIS — R1110 Vomiting, unspecified: Secondary | ICD-10-CM | POA: Diagnosis not present

## 2019-04-28 MED ORDER — SODIUM CHLORIDE 0.9 % IV SOLN: 500.0000 mL | Freq: Once | INTRAVENOUS | Status: DC

## 2019-04-28 NOTE — Progress Notes (Signed)
Report given to PACU, vss 

## 2019-04-28 NOTE — Op Note (Signed)
[place] Patient Name: [name] Procedure Date: 04/28/2019 4:11 PM MRN: [id number] Endoscopist: Mauri Pole , MD Age: 84 Referring MD:  Date of Birth: 08-18-1935 Gender: Female Account #: [id number] Procedure:                Upper GI endoscopy Indications:              Epigastric abdominal pain, Nausea with vomiting Medicines:                Monitored Anesthesia Care Procedure:                Pre-Anesthesia Assessment:                           - Prior to the procedure, a History and Physical                            was performed, and patient medications and                            allergies were reviewed. The patient's tolerance of                            previous anesthesia was also reviewed. The risks                            and benefits of the procedure and the sedation                            options and risks were discussed with the patient.                            All questions were answered, and informed consent                            was obtained. Prior Anticoagulants: The patient has                            taken no previous anticoagulant or antiplatelet                            agents. ASA Grade Assessment: III - A patient with                            severe systemic disease. After reviewing the risks                            and benefits, the patient was deemed in                            satisfactory condition to undergo the procedure.                           After obtaining informed consent, the endoscope was  passed under direct vision. Throughout the                            procedure, the patient's blood pressure, pulse, and                            oxygen saturations were monitored continuously. The                            Endoscope was introduced through the mouth, and                            advanced to the duodenal bulb. The upper GI   endoscopy was accomplished without difficulty. The                            patient tolerated the procedure well. Scope In: Scope Out: Findings:                 No gross lesions were noted in the entire esophagus.                           Diffuse mildly edematous and erythematous mucosa                            without bleeding was found in the entire examined                            stomach.                           A large amount of food (residue) was found in the                            gastric fundus and in the gastric body. Around                            1000cc of fluid suctioned from the stomach                           Food (residue) was found in the duodenal bulb.                            Unable to advance scope to 2nd part of duodenum. Complications:            No immediate complications. Estimated Blood Loss:     Estimated blood loss was minimal. Impression:               - No gross lesions in esophagus.                           - Erythematous mucosa in the stomach.                           - A large amount of food (residue) in the stomach.                           -  Retained food in the duodenum.                           - No specimens collected. Recommendation:           - Patient has a contact number available for                            emergencies. The signs and symptoms of potential                            delayed complications were discussed with the                            patient. Return to normal activities tomorrow.                            Written discharge instructions were provided to the                            patient.                           - Gastroparesis diet.                           - Continue present medications.                           - Do an upper GI series at the next available                            appointment to exclude gastric outlet obstruction.                           - Will plan to repeat EGD  based on findings of                            upper GI series. Mauri Pole, MD 04/28/2019 4:38:52 PM This report has been signed electronically.

## 2019-04-28 NOTE — Patient Instructions (Signed)
YOU HAD AN ENDOSCOPIC PROCEDURE TODAY AT THE [facility]:   Refer to the procedure report that was given to you for any specific questions about what was found during the examination.  If the procedure report does not answer your questions, please call your gastroenterologist to clarify.  If you requested that your care partner not be given the details of your procedure findings, then the procedure report has been included in a sealed envelope for you to review at your convenience later.  YOU SHOULD EXPECT: Some feelings of bloating in the abdomen. Passage of more gas than usual.  Walking can help get rid of the air that was put into your GI tract during the procedure and reduce the bloating. If you had a lower endoscopy (such as a colonoscopy or flexible sigmoidoscopy) you may notice spotting of blood in your stool or on the toilet paper. If you underwent a bowel prep for your procedure, you may not have a normal bowel movement for a few days.  Please Note:  You might notice some irritation and congestion in your nose or some drainage.  This is from the oxygen used during your procedure.  There is no need for concern and it should clear up in a day or so.  SYMPTOMS TO REPORT IMMEDIATELY:   Following upper endoscopy (EGD)  Vomiting of blood or coffee ground material  New chest pain or pain under the shoulder blades  Painful or persistently difficult swallowing  New shortness of breath  Fever of 100F or higher  Black, tarry-looking stools  For urgent or emergent issues, a gastroenterologist can be reached at any hour by calling [phone].   DIET:  We do recommend a small meal at first, but then you may proceed to your regular diet.  Drink plenty of fluids but you should avoid alcoholic beverages for 24 hours.  ACTIVITY:  You should plan to take it easy for the rest of today and you should NOT DRIVE or use heavy machinery until tomorrow (because of the sedation medicines used  during the test).    FOLLOW UP: Our staff will call the number listed on your records 48-72 hours following your procedure to check on you and address any questions or concerns that you may have regarding the information given to you following your procedure. If we do not reach you, we will leave a message.  We will attempt to reach you two times.  During this call, we will ask if you have developed any symptoms of COVID 19. If you develop any symptoms (ie: fever, flu-like symptoms, shortness of breath, cough etc.) before then, please call [phone].  If you test positive for Covid 19 in the 2 weeks post procedure, please call and report this information to us.    If any biopsies were taken you will be contacted by phone or by letter within the next 1-3 weeks.  Please call us at [phone] if you have not heard about the biopsies in 3 weeks.    SIGNATURES/CONFIDENTIALITY: You and/or your care partner have signed paperwork which will be entered into your electronic medical record.  These signatures attest to the fact that that the information above on your After Visit Summary has been reviewed and is understood.  Full responsibility of the confidentiality of this discharge information lies with you and/or your care-partner. 

## 2019-04-29 ENCOUNTER — Other Ambulatory Visit: Payer: Self-pay

## 2019-04-29 DIAGNOSIS — R1110 Vomiting, unspecified: Secondary | ICD-10-CM

## 2019-04-29 DIAGNOSIS — R1013 Epigastric pain: Secondary | ICD-10-CM

## 2019-04-30 ENCOUNTER — Telehealth: Payer: Self-pay | Admitting: *Deleted

## 2019-04-30 NOTE — Telephone Encounter (Signed)
  Follow up Call-  Call back number 04/28/2019  Post procedure Call Back phone  # [phone]  Permission to leave phone message Yes  Some recent data might be hidden     Patient questions:  Do you have a fever, pain , or abdominal swelling? No. Pain Score  0 *  Have you tolerated food without any problems? No.  Have you been able to return to your normal activities? No.  Do you have any questions about your discharge instructions: Diet   No. Medications  No. Follow up visit  No.  Do you have questions or concerns about your Care? Yes.    Patient still nauseated and vomiting but pt's husband advised you are aware and working with her.  Actions: * If pain score is 4 or above: No action needed, pain <4.  1. Have you developed a fever since your procedure? no  2.   Have you had an respiratory symptoms (SOB or cough) since your procedure? no  3.   Have you tested positive for COVID 19 since your procedure no  4.   Have you had any family members/close contacts diagnosed with the COVID 19 since your procedure?  no   If yes to any of these questions please route to Joylene John, RN and Alphonsa Gin, Therapist, sports.

## 2019-04-30 NOTE — Telephone Encounter (Signed)
Yes, we have her scheduled for upper GI series for further evaluation of possible gastric outlet obstruction.  Advise patient to continue with liquid diet as tolerated.  Zofran as needed.  Thank you

## 2019-05-01 ENCOUNTER — Encounter (HOSPITAL_COMMUNITY): Payer: Self-pay

## 2019-05-01 ENCOUNTER — Telehealth: Payer: Self-pay | Admitting: Gastroenterology

## 2019-05-01 ENCOUNTER — Inpatient Hospital Stay (HOSPITAL_COMMUNITY)
Admission: EM | Admit: 2019-05-01 | Discharge: 2019-05-19 | DRG: 327 | Disposition: A | Discharge status: Home Health | Payer: Medicare Other | Source: Ambulatory Visit | Attending: Internal Medicine | Admitting: Internal Medicine

## 2019-05-01 ENCOUNTER — Other Ambulatory Visit: Payer: Self-pay

## 2019-05-01 ENCOUNTER — Emergency Department (HOSPITAL_COMMUNITY): Payer: Medicare Other

## 2019-05-01 DIAGNOSIS — E210 Primary hyperparathyroidism: Secondary | ICD-10-CM | POA: Diagnosis present

## 2019-05-01 DIAGNOSIS — F418 Other specified anxiety disorders: Secondary | ICD-10-CM | POA: Diagnosis present

## 2019-05-01 DIAGNOSIS — E669 Obesity, unspecified: Secondary | ICD-10-CM | POA: Diagnosis present

## 2019-05-01 DIAGNOSIS — Z20828 Contact with and (suspected) exposure to other viral communicable diseases: Secondary | ICD-10-CM | POA: Diagnosis present

## 2019-05-01 DIAGNOSIS — K315 Obstruction of duodenum: Secondary | ICD-10-CM

## 2019-05-01 DIAGNOSIS — K317 Polyp of stomach and duodenum: Secondary | ICD-10-CM | POA: Diagnosis present

## 2019-05-01 DIAGNOSIS — Z888 Allergy status to other drugs, medicaments and biological substances status: Secondary | ICD-10-CM

## 2019-05-01 DIAGNOSIS — E039 Hypothyroidism, unspecified: Secondary | ICD-10-CM | POA: Diagnosis present

## 2019-05-01 DIAGNOSIS — N183 Chronic kidney disease, stage 3 unspecified: Secondary | ICD-10-CM | POA: Diagnosis present

## 2019-05-01 DIAGNOSIS — K3189 Other diseases of stomach and duodenum: Secondary | ICD-10-CM | POA: Diagnosis not present

## 2019-05-01 DIAGNOSIS — Z9049 Acquired absence of other specified parts of digestive tract: Secondary | ICD-10-CM

## 2019-05-01 DIAGNOSIS — G4733 Obstructive sleep apnea (adult) (pediatric): Secondary | ICD-10-CM | POA: Diagnosis present

## 2019-05-01 DIAGNOSIS — K210 Gastro-esophageal reflux disease with esophagitis: Secondary | ICD-10-CM | POA: Diagnosis present

## 2019-05-01 DIAGNOSIS — Z833 Family history of diabetes mellitus: Secondary | ICD-10-CM

## 2019-05-01 DIAGNOSIS — E785 Hyperlipidemia, unspecified: Secondary | ICD-10-CM | POA: Diagnosis present

## 2019-05-01 DIAGNOSIS — E1122 Type 2 diabetes mellitus with diabetic chronic kidney disease: Secondary | ICD-10-CM | POA: Diagnosis present

## 2019-05-01 DIAGNOSIS — K259 Gastric ulcer, unspecified as acute or chronic, without hemorrhage or perforation: Secondary | ICD-10-CM | POA: Diagnosis present

## 2019-05-01 DIAGNOSIS — R112 Nausea with vomiting, unspecified: Secondary | ICD-10-CM | POA: Diagnosis present

## 2019-05-01 DIAGNOSIS — K279 Peptic ulcer, site unspecified, unspecified as acute or chronic, without hemorrhage or perforation: Secondary | ICD-10-CM | POA: Diagnosis not present

## 2019-05-01 DIAGNOSIS — Z91041 Radiographic dye allergy status: Secondary | ICD-10-CM

## 2019-05-01 DIAGNOSIS — Z79890 Hormone replacement therapy (postmenopausal): Secondary | ICD-10-CM

## 2019-05-01 DIAGNOSIS — Z6836 Body mass index (BMI) 36.0-36.9, adult: Secondary | ICD-10-CM

## 2019-05-01 DIAGNOSIS — K311 Adult hypertrophic pyloric stenosis: Secondary | ICD-10-CM | POA: Diagnosis present

## 2019-05-01 DIAGNOSIS — E440 Moderate protein-calorie malnutrition: Secondary | ICD-10-CM | POA: Diagnosis present

## 2019-05-01 DIAGNOSIS — Z885 Allergy status to narcotic agent status: Secondary | ICD-10-CM

## 2019-05-01 DIAGNOSIS — M810 Age-related osteoporosis without current pathological fracture: Secondary | ICD-10-CM | POA: Diagnosis present

## 2019-05-01 DIAGNOSIS — R933 Abnormal findings on diagnostic imaging of other parts of digestive tract: Secondary | ICD-10-CM

## 2019-05-01 DIAGNOSIS — I10 Essential (primary) hypertension: Secondary | ICD-10-CM | POA: Diagnosis not present

## 2019-05-01 DIAGNOSIS — Z981 Arthrodesis status: Secondary | ICD-10-CM

## 2019-05-01 DIAGNOSIS — R600 Localized edema: Secondary | ICD-10-CM | POA: Diagnosis not present

## 2019-05-01 DIAGNOSIS — F32A Depression, unspecified: Secondary | ICD-10-CM | POA: Diagnosis present

## 2019-05-01 DIAGNOSIS — K5732 Diverticulitis of large intestine without perforation or abscess without bleeding: Secondary | ICD-10-CM | POA: Diagnosis present

## 2019-05-01 DIAGNOSIS — J479 Bronchiectasis, uncomplicated: Secondary | ICD-10-CM | POA: Diagnosis present

## 2019-05-01 DIAGNOSIS — R1114 Bilious vomiting: Secondary | ICD-10-CM | POA: Diagnosis not present

## 2019-05-01 DIAGNOSIS — N179 Acute kidney failure, unspecified: Secondary | ICD-10-CM | POA: Diagnosis not present

## 2019-05-01 DIAGNOSIS — I129 Hypertensive chronic kidney disease with stage 1 through stage 4 chronic kidney disease, or unspecified chronic kidney disease: Secondary | ICD-10-CM | POA: Diagnosis present

## 2019-05-01 DIAGNOSIS — E860 Dehydration: Secondary | ICD-10-CM | POA: Diagnosis present

## 2019-05-01 DIAGNOSIS — E876 Hypokalemia: Secondary | ICD-10-CM | POA: Diagnosis present

## 2019-05-01 DIAGNOSIS — Z87891 Personal history of nicotine dependence: Secondary | ICD-10-CM

## 2019-05-01 DIAGNOSIS — Z7951 Long term (current) use of inhaled steroids: Secondary | ICD-10-CM

## 2019-05-01 DIAGNOSIS — Z96651 Presence of right artificial knee joint: Secondary | ICD-10-CM | POA: Diagnosis present

## 2019-05-01 DIAGNOSIS — K209 Esophagitis, unspecified: Secondary | ICD-10-CM | POA: Diagnosis not present

## 2019-05-01 DIAGNOSIS — K760 Fatty (change of) liver, not elsewhere classified: Secondary | ICD-10-CM | POA: Diagnosis present

## 2019-05-01 DIAGNOSIS — Z79899 Other long term (current) drug therapy: Secondary | ICD-10-CM

## 2019-05-01 DIAGNOSIS — Z713 Dietary counseling and surveillance: Secondary | ICD-10-CM

## 2019-05-01 DIAGNOSIS — H409 Unspecified glaucoma: Secondary | ICD-10-CM | POA: Diagnosis present

## 2019-05-01 DIAGNOSIS — F329 Major depressive disorder, single episode, unspecified: Secondary | ICD-10-CM | POA: Diagnosis present

## 2019-05-01 DIAGNOSIS — Z8249 Family history of ischemic heart disease and other diseases of the circulatory system: Secondary | ICD-10-CM

## 2019-05-01 DIAGNOSIS — K449 Diaphragmatic hernia without obstruction or gangrene: Secondary | ICD-10-CM | POA: Diagnosis present

## 2019-05-01 DIAGNOSIS — Z886 Allergy status to analgesic agent status: Secondary | ICD-10-CM

## 2019-05-01 DIAGNOSIS — E8770 Fluid overload, unspecified: Secondary | ICD-10-CM | POA: Diagnosis not present

## 2019-05-01 LAB — COMPREHENSIVE METABOLIC PANEL
ALT: 24 U/L (ref 0–44)
AST: 32 U/L (ref 15–41)
Albumin: 3.3 g/dL — ABNORMAL LOW (ref 3.5–5.0)
Alkaline Phosphatase: 105 U/L (ref 38–126)
Anion gap: 12 (ref 5–15)
BUN: 33 mg/dL — ABNORMAL HIGH (ref 8–23)
CO2: 28 mmol/L (ref 22–32)
Calcium: 10.8 mg/dL — ABNORMAL HIGH (ref 8.9–10.3)
Chloride: 97 mmol/L — ABNORMAL LOW (ref 98–111)
Creatinine, Ser: 2.04 mg/dL — ABNORMAL HIGH (ref 0.44–1.00)
GFR calc Af Amer: 26 mL/min — ABNORMAL LOW (ref >60)
GFR calc non Af Amer: 22 mL/min — ABNORMAL LOW (ref >60)
Glucose, Bld: 126 mg/dL — ABNORMAL HIGH (ref 70–99)
Potassium: 3.7 mmol/L (ref 3.5–5.1)
Sodium: 137 mmol/L (ref 135–145)
Total Bilirubin: 0.9 mg/dL (ref 0.3–1.2)
Total Protein: 6.4 g/dL — ABNORMAL LOW (ref 6.5–8.1)

## 2019-05-01 LAB — SARS CORONAVIRUS 2 BY RT PCR (HOSPITAL ORDER, PERFORMED IN ~~LOC~~ HOSPITAL LAB): SARS Coronavirus 2: NEGATIVE

## 2019-05-01 LAB — CBC
HCT: 45.6 % (ref 36.0–46.0)
Hemoglobin: 14.2 g/dL (ref 12.0–15.0)
MCH: 29.4 pg (ref 26.0–34.0)
MCHC: 31.1 g/dL (ref 30.0–36.0)
MCV: 94.4 fL (ref 80.0–100.0)
Platelets: 361 10*3/uL (ref 150–400)
RBC: 4.83 MIL/uL (ref 3.87–5.11)
RDW: 14.8 % (ref 11.5–15.5)
WBC: 9.8 10*3/uL (ref 4.0–10.5)
nRBC: 0 % (ref 0.0–0.2)

## 2019-05-01 LAB — LIPASE, BLOOD: Lipase: 30 U/L (ref 11–51)

## 2019-05-01 LAB — MAGNESIUM: Magnesium: 2.1 mg/dL (ref 1.7–2.4)

## 2019-05-01 MED ORDER — FAMOTIDINE IN NACL 20-0.9 MG/50ML-% IV SOLN
20.0000 mg | Freq: Once | INTRAVENOUS | Status: AC
  Administered 2019-05-01: 20 mg via INTRAVENOUS
  Filled 2019-05-01: qty 50

## 2019-05-01 MED ORDER — ONDANSETRON 4 MG PO TBDP
4.0000 mg | ORAL_TABLET | Freq: Once | ORAL | Status: AC | PRN
  Administered 2019-05-01: 4 mg via ORAL
  Filled 2019-05-01: qty 1

## 2019-05-01 MED ORDER — ONDANSETRON HCL 4 MG/2ML IJ SOLN
4.0000 mg | Freq: Four times a day (QID) | INTRAMUSCULAR | Status: DC | PRN
  Administered 2019-05-05 – 2019-05-18 (×7): 4 mg via INTRAVENOUS
  Filled 2019-05-01 (×8): qty 2

## 2019-05-01 MED ORDER — SODIUM CHLORIDE 0.9% FLUSH: 3.0000 mL | Freq: Once | INTRAVENOUS | Status: DC

## 2019-05-01 MED ORDER — CHLORHEXIDINE GLUCONATE 0.12 % MT SOLN
15.0000 mL | Freq: Two times a day (BID) | OROMUCOSAL | Status: DC
  Administered 2019-05-01 – 2019-05-18 (×32): 15 mL via OROMUCOSAL
  Filled 2019-05-01 (×32): qty 15

## 2019-05-01 MED ORDER — ONDANSETRON HCL 4 MG PO TABS: 4.0000 mg | ORAL_TABLET | Freq: Four times a day (QID) | ORAL | Status: DC | PRN

## 2019-05-01 MED ORDER — SODIUM CHLORIDE 0.9 % IV BOLUS
1000.0000 mL | Freq: Once | INTRAVENOUS | Status: AC
  Administered 2019-05-01: 1000 mL via INTRAVENOUS

## 2019-05-01 MED ORDER — ORAL CARE MOUTH RINSE
15.0000 mL | Freq: Two times a day (BID) | OROMUCOSAL | Status: DC
  Administered 2019-05-02 – 2019-05-17 (×24): 15 mL via OROMUCOSAL

## 2019-05-01 MED ORDER — DEXTROSE-NACL 5-0.9 % IV SOLN
INTRAVENOUS | Status: DC
  Administered 2019-05-01 – 2019-05-05 (×8): via INTRAVENOUS

## 2019-05-01 MED ORDER — HEPARIN SODIUM (PORCINE) 5000 UNIT/ML IJ SOLN
5000.0000 [IU] | Freq: Three times a day (TID) | INTRAMUSCULAR | Status: AC
  Administered 2019-05-02: 5000 [IU] via SUBCUTANEOUS
  Filled 2019-05-01 (×2): qty 1

## 2019-05-01 NOTE — Telephone Encounter (Signed)
Mr Osland reports the patient has become increasingly weak. She is not eating solids. She is taking in minimal amount of fluids. Still has urinary output. Has not vomited yet today. Can not walk without assistance.  I have checked for an earlier opening for the UGI without success. Mr Sisk says "if something is not done she will be in the ER."

## 2019-05-01 NOTE — ED Triage Notes (Signed)
Patient c/o N/V/D x "days". Patient had a an endoscopy and states she has a blockage in her duodenum.

## 2019-05-01 NOTE — ED Provider Notes (Signed)
[facility] DEPT Provider Note   CSN: [id number] Arrival date & time: 05/01/19  1414    History   Chief Complaint Chief Complaint  Patient presents with   Emesis   Diarrhea    HPI [name] is a 84 y.o. female.     84 yo F with a cc of n/v/d. Going on for the past couple weeks.  Has had two admission for this already.  One here and one in [place].  Both time she was diagnosed with diverticulitis started on antibiotics and had some improvement.  Most recently when she was in [place] she started on steroids with the presumption that maybe she had inflammatory bowel disease and had some improvement.  Has been seeing [facility] as an outpatient.  Had an endoscopy performed that showed a large amounts of food still in the stomach.  Started on a gastroparesis diet.  Since then the patient has had significant decreased oral intake.  Increased vomiting and diarrhea.  Has been too weak to get up and move around at home so called the gastroenterologist who suggested she come here for inpatient continued evaluation of her symptoms.  The history is provided by the patient.  Emesis Associated symptoms: diarrhea   Associated symptoms: no abdominal pain, no arthralgias, no chills, no fever, no headaches and no myalgias   Diarrhea Associated symptoms: vomiting   Associated symptoms: no abdominal pain, no arthralgias, no chills, no fever, no headaches and no myalgias   Illness Severity:  Moderate Onset quality:  Gradual Duration:  2 weeks Timing:  Constant Progression:  Worsening Chronicity:  Recurrent Associated symptoms: diarrhea, nausea and vomiting   Associated symptoms: no abdominal pain, no chest pain, no congestion, no fever, no headaches, no myalgias, no rhinorrhea, no shortness of breath and no wheezing     Past Medical History:  Diagnosis Date   Allergy    Anemia    Anxiety    Bronchiectasis ([place]) 10/01/2015   Cataract     removed both eyes   Depression    Diabetes mellitus without complication (HCC)    diet controlled DM- no meds    Diverticulosis of colon (without mention of hemorrhage) 07/17/2007   Gastritis 07/17/2007   GERD (gastroesophageal reflux disease)    Glaucoma    History of fatty infiltration of liver    Hyperlipemia    Hyperparathyroidism ([place])    Hypertension    under control    Lumbosacral root lesions, not elsewhere classified 04/13/2014   Obese    Osteoarthritis    Osteoporosis    PONV (postoperative nausea and vomiting)    Not in last several surgeries   Renal calculus, right    severe   Sleep apnea    uses cpap at night    Thyroid disease     Patient Active Problem List   Diagnosis Date Noted   Nausea and/or vomiting 05/01/2019   Low hemoglobin    Heme positive stool    Iron deficiency anemia due to chronic blood loss    Acute diverticulitis 01/28/2019   Melena 01/28/2019   AKI (acute kidney injury) ([place]) 01/28/2019   CKD (chronic kidney disease) stage 3, GFR 30-59 ml/min ([place]) 01/28/2019   Hypothyroid 01/28/2019   Acute bronchitis 01/30/2018   OSA (obstructive sleep apnea) 11/10/2017   Hyperparathyroidism ([place]) 01/04/2017   Hyperparathyroidism, primary ([place]) 01/01/2017   Cough variant asthma vs uacs vs obst bronchiectasis  10/23/2015   Severe obesity (BMI >= 40) (HCC)  10/08/2015   Bronchiectasis ([place]) 10/01/2015   Lumbosacral root lesions, not elsewhere classified 04/13/2014   HYPERLIPIDEMIA 12/11/2007   ANXIETY DEPRESSION 12/11/2007   Essential hypertension 12/11/2007   IRRITABLE BOWEL SYNDROME 12/11/2007   RECTAL POLYPS 12/11/2007   NEPHROLITHIASIS, RECURRENT 12/11/2007   MELANOMA, HX OF 12/11/2007   GASTRITIS 07/17/2007   DIVERTICULITIS OF COLON 11/19/2002    Past Surgical History:  Procedure Laterality Date   APPENDECTOMY  1974   BREAST BIOPSY  1988   LEFT--BENIGN    [place]   CHOLECYSTECTOMY  2000   COLONOSCOPY  2008   ESOPHAGOGASTRODUODENOSCOPY  2008   EYE SURGERY     Lasik, Cataract, Hx of Glaucoma   KNEE ARTHROPLASTY  2004   RIGHT-TOTAL    LUMBAR FUSION  2008   PARATHYROIDECTOMY  2009   X 2--RIGHT INFERIOR AND LEFT SUPERIOR   PARATHYROIDECTOMY N/A 01/04/2017   Procedure: RIGHT NECK EXPLORATION WITH PARATHYROIDECTOMY;  Surgeon: Armandina Gemma, MD;  Location: [facility];  Service: General;  Laterality: N/A;   ROTATOR CUFF REPAIR  2000   RIGHT   TUBAL LIGATION  1974   UPPER GASTROINTESTINAL ENDOSCOPY       OB History   No obstetric history on file.      Home Medications    Prior to Admission medications   Medication Sig Start Date End Date Taking? Authorizing Provider  albuterol (VENTOLIN HFA) 108 (90 Base) MCG/ACT inhaler Inhale 2 puffs into the lungs every 6 (six) hours as needed for wheezing or shortness of breath. 01/30/19  Yes Mariel Aloe, MD  dorzolamide (TRUSOPT) 2 % ophthalmic solution Place 1 drop into both eyes 2 (two) times daily.  11/15/15  Yes [provider]  fluticasone (FLONASE) 50 MCG/ACT nasal spray Place 2 sprays into both nostrils daily. Patient taking differently: Place 2 sprays into both nostrils at bedtime as needed for allergies.  09/01/15  Yes Skeet Latch, MD  ibuprofen (ADVIL) 200 MG tablet Take 200-400 mg by mouth at bedtime as needed for moderate pain.    Yes [provider]  levothyroxine (SYNTHROID) 50 MCG tablet Take 50 mcg by mouth daily. 12/18/18  Yes [provider]  loperamide (IMODIUM) 2 MG capsule Take 2 mg by mouth as needed for diarrhea or loose stools.    Yes [provider]  losartan (COZAAR) 100 MG tablet TAKE 1 TABLET EACH DAY. Patient taking differently: Take 100 mg by mouth daily.  07/24/16  Yes Skeet Latch, MD  PROTONIX 40 MG tablet Take 40 mg by mouth daily.  03/18/19  Yes [provider]  ROCKLATAN 0.02-0.005 % SOLN  Place 1 drop into both eyes at bedtime. 01/08/19  Yes [provider]  sertraline (ZOLOFT) 50 MG tablet Take 50 mg by mouth daily.  03/24/19  Yes [provider]  zoledronic acid (RECLAST) 5 MG/100ML SOLN injection Inject 5 mg into the vein yearly. (Start June 2015)   Yes [provider]  cefdinir (OMNICEF) 300 MG capsule Take 1 capsule (300 mg total) by mouth 2 (two) times daily. Patient not taking: Reported on 04/01/2019 02/25/19   Mauri Pole, MD  metroNIDAZOLE (FLAGYL) 500 MG tablet Take 1 tablet (500 mg total) by mouth 3 (three) times daily. Patient not taking: Reported on 05/01/2019 02/25/19   Mauri Pole, MD  saccharomyces boulardii (FLORASTOR) 250 MG capsule Take 1 capsule (250 mg total) by mouth 2 (two) times daily. 1 twice daily for  21 days Patient not taking: Reported on 04/28/2019 02/13/19   Mauri Pole, MD  triamterene-hydrochlorothiazide (MAXZIDE-25) 37.5-25 MG tablet Take 1 tablet by mouth as directed. One whole tablet daily alternating with 1/2 tablet daily. 11/13/15   [provider]    Family History Family History  Problem Relation Age of Onset   Colon polyps Mother    Hypertension Mother    Osteoarthritis Mother    Diabetes Father    Stroke Father    Hernia Father        Esophageal hernia   Osteoarthritis Maternal Grandmother    Stroke Maternal Grandfather    Liver disease Paternal Grandmother    Stroke Paternal Grandfather    Colon cancer Neg Hx    Esophageal cancer Neg Hx    Rectal cancer Neg Hx    Stomach cancer Neg Hx     Social History Social History   Tobacco Use   Smoking status: Former Smoker    Packs/day: 0.25    Years: 15.00    Pack years: 3.75    Types: Cigarettes    Quit date: 09/01/2014    Years since quitting: 4.6   Smokeless tobacco: Never Used  Substance Use Topics   Alcohol use: Yes    Alcohol/week: 1.0 standard drinks    Types: 1 Glasses of wine per week    Comment:  occasional   Drug use: No     Allergies   Codeine, Contrast media [iodinated diagnostic agents], Iohexol, Aspirin, and Fentanyl   Review of Systems Review of Systems  Constitutional: Negative for chills and fever.  HENT: Negative for congestion and rhinorrhea.   Eyes: Negative for redness and visual disturbance.  Respiratory: Negative for shortness of breath and wheezing.   Cardiovascular: Negative for chest pain and palpitations.  Gastrointestinal: Positive for diarrhea, nausea and vomiting. Negative for abdominal pain.  Genitourinary: Negative for dysuria and urgency.  Musculoskeletal: Negative for arthralgias and myalgias.  Skin: Negative for pallor and wound.  Neurological: Negative for dizziness and headaches.     Physical Exam Updated Vital Signs BP 109/85    Pulse 73    Temp 98.1 F (36.7 C) (Oral)    Resp 16    SpO2 95%   Physical Exam Vitals signs and nursing note reviewed.  Constitutional:      General: She is not in acute distress.    Appearance: She is well-developed. She is not diaphoretic.  HENT:     Head: Normocephalic and atraumatic.  Eyes:     Pupils: Pupils are equal, round, and reactive to light.  Neck:     Musculoskeletal: Normal range of motion and neck supple.  Cardiovascular:     Rate and Rhythm: Normal rate and regular rhythm.     Heart sounds: No murmur. No friction rub. No gallop.   Pulmonary:     Effort: Pulmonary effort is normal.     Breath sounds: No wheezing or rales.  Abdominal:     General: There is no distension.     Palpations: Abdomen is soft.     Tenderness: There is abdominal tenderness (mild diffuse). There is no guarding.  Musculoskeletal:        General: No tenderness.  Skin:    General: Skin is warm and dry.  Neurological:     Mental Status: She is alert and oriented to person, place, and time.  Psychiatric:        Behavior: Behavior normal.      ED Treatments /  Results  Labs (all labs ordered are listed, but  only abnormal results are displayed) Labs Reviewed  COMPREHENSIVE METABOLIC PANEL - Abnormal; Notable for the following components:      Result Value   Chloride 97 (*)    Glucose, Bld 126 (*)    BUN 33 (*)    Creatinine, Ser 2.04 (*)    Calcium 10.8 (*)    Total Protein 6.4 (*)    Albumin 3.3 (*)    GFR calc non Af Amer 22 (*)    GFR calc Af Amer 26 (*)    All other components within normal limits  SARS CORONAVIRUS 2 (HOSPITAL ORDER, [facility])  GASTROINTESTINAL PANEL BY PCR, STOOL (REPLACES STOOL CULTURE)  C DIFFICILE QUICK SCREEN W PCR REFLEX  LIPASE, BLOOD  CBC  MAGNESIUM  URINALYSIS, ROUTINE W REFLEX MICROSCOPIC    EKG None  Radiology Ct Abdomen Pelvis Wo Contrast  Result Date: 05/01/2019 CLINICAL DATA:  Abdominal pain, question diverticulitis patient had endoscopy and said she had a blockage entire duodenum EXAM: CT ABDOMEN AND PELVIS WITHOUT CONTRAST TECHNIQUE: Multidetector CT imaging of the abdomen and pelvis was performed following the standard protocol without IV contrast. COMPARISON:  January 28, 2019 FINDINGS: Lower chest: Mitral valve calcifications are seen. The visualized heart is normal in size. No hiatal hernia. The visualized portions of the lungs are clear. Hepatobiliary: Although limited due to the lack of intravenous contrast, normal in appearance without gross focal abnormality. The patient is status post cholecystectomy. No biliary ductal dilation. Pancreas:  Unremarkable.  No surrounding inflammatory changes. Spleen: Normal in size. Although limited due to the lack of intravenous contrast, normal in appearance. Adrenals/Urinary Tract: Both adrenal glands appear normal. Again noted are multiple low-density lesions seen within both kidneys the largest seen in the lower pole of the right kidney measuring 3.6 cm. There is a slightly hyperdense lesion seen within the posterior midpole of the left kidney measuring 1 cm as on the prior exam  and could represent proteinaceous/hemorrhagic cyst. The remainder are likely simple renal cysts. Again noted are multiple bilateral non-obstructing renal calculi. No perinephric stranding is seen. No ureteral or bladder calculi are noted. Stomach/Bowel: There is a dilated fluid and air-filled stomach to the level of the gastroduodenal junction where there is an gradual tapering. There is air and fluid seen within the third portion of the duodenum. The remainder of the small bowel is grossly normal in appearance. There is sigmoid colonic diverticula with question of minimal stranding seen adjacent to the sigmoid colon, series 2, image 66 which could represent mild sigmoid colonic diverticulitis. No pericolonic collection or free air is seen. Vascular/Lymphatic: There are no enlarged abdominal or pelvic lymph nodes. Scattered aortic atherosclerotic calcifications are seen without aneurysmal dilatation. Reproductive: The uterus and adnexa are unremarkable. Other: No evidence of abdominal wall mass or hernia. Musculoskeletal: No acute or significant osseous findings. The patient is status post decompression and fixation from L2 through L4. IMPRESSION: 1. Dilated fluid and air-filled stomach to the level of the gastric duodenal junction, consistent with the patient's history and recent endoscopy. There is limited visualization of the second portion of the duodenum to the lack of oral contrast. If there is question of gastric motility issues would recommend gastric emptying study. 2. Mild sigmoid colonic diverticulitis. No pericolonic abscess or free air Electronically Signed   By: Prudencio Pair M.D.   On: 05/01/2019 17:00    Procedures Procedures (including critical care time)  Medications Ordered in ED Medications  sodium chloride flush (NS) 0.9 % injection 3 mL (3 mLs Intravenous Not Given 05/01/19 1728)  famotidine (PEPCID) IVPB 20 mg premix (has no administration in time range)  sodium chloride 0.9 % bolus  1,000 mL (has no administration in time range)  ondansetron (ZOFRAN-ODT) disintegrating tablet 4 mg (4 mg Oral Given 05/01/19 1449)     Initial Impression / Assessment and Plan / ED Course  I have reviewed the triage vital signs and the nursing notes.  Pertinent labs & imaging results that were available during my care of the patient were reviewed by me and considered in my medical decision making (see chart for details).        84 yo F with a chief complaint of nausea vomiting and diarrhea.  Going on for at least the past couple weeks.  Has had 2 admissions for this previously.  Diagnosed with diverticulitis both times.  Had a recent endoscopy that was concerning for gastroparesis.  She is scheduled for an upper GI series but had worsening nausea and vomiting over the past few days.  No fevers no abdominal pain.  She is mildly tender on exam.  Will obtain a laboratory evaluation give a bolus of IV fluids discuss with GI.  Lab work consistent with acute kidney injury.  Otherwise unremarkable.  CT scan with no change from what was already known.  Large amount of food seen in the stomach.  No diverticulitis.  I discussed case with Dr. Henrene Pastor.  No further work-up overnight, recommended keeping her n.p.o.  NG tube if needed.  They will evaluate her in the morning.  Will discuss with the hospitalist.  The patients results and plan were reviewed and discussed.   Any x-rays performed were independently reviewed by myself.   Differential diagnosis were considered with the presenting HPI.  Medications  sodium chloride flush (NS) 0.9 % injection 3 mL (3 mLs Intravenous Not Given 05/01/19 1728)  famotidine (PEPCID) IVPB 20 mg premix (has no administration in time range)  sodium chloride 0.9 % bolus 1,000 mL (has no administration in time range)  ondansetron (ZOFRAN-ODT) disintegrating tablet 4 mg (4 mg Oral Given 05/01/19 1449)    Vitals:   05/01/19 1630 05/01/19 1647 05/01/19 1700 05/01/19 1844    BP: (!) 128/57 (!) 128/57 (!) 100/56 109/85  Pulse: 65 70 69 73  Resp:  15 14 16   Temp:      TempSrc:      SpO2: 96% 96% 94% 95%    Final diagnoses:  Gastric outlet obstruction    Admission/ observation were discussed with the admitting physician, patient and/or family and they are comfortable with the plan.    Final Clinical Impressions(s) / ED Diagnoses   Final diagnoses:  Gastric outlet obstruction    ED Discharge Orders    None       Deno Etienne, DO 05/01/19 1925

## 2019-05-01 NOTE — ED Notes (Addendum)
ED TO INPATIENT HANDOFF REPORT  ED Nurse Name and Phone #: Maylon Cos [phone]  S Name/Age/Gender [name] 84 y.o. female Room/Bed: WA14/WA14  Code Status   Code Status: Prior  Home/SNF/Other Home Patient oriented to: self, place, time and situation Is this baseline? Yes   Triage Complete: Triage complete  Chief Complaint nausea   Triage Note Patient c/o N/V/D x "days". Patient had a an endoscopy and states she has a blockage in her duodenum.   Allergies Allergies  Allergen Reactions  . Codeine Nausea And Vomiting  . Contrast Media [Iodinated Diagnostic Agents] Hives, Shortness Of Breath and Itching  . Iohexol Hives and Other (See Comments)    REACTION: " TROUBLE BREATHING"  . Aspirin     In high doses bleeding  . Fentanyl     Altered mental state    Level of Care/Admitting Diagnosis ED Disposition    ED Disposition Condition [facility] Area: [place] [[id number]]  Level of Care: Med-Surg [16]  Covid Evaluation: Asymptomatic Screening Protocol (No Symptoms)  Diagnosis: Nausea and/or vomiting [657846]  Admitting Physician: Elwyn Reach [[id number]]  Attending Physician: Elwyn Reach [[id number]]  Estimated length of stay: past midnight tomorrow  Certification:: I certify this patient will need inpatient services for at least 2 midnights  PT Class (Do Not Modify): Inpatient [101]  PT Acc Code (Do Not Modify): Private [1]       B Medical/Surgery History Past Medical History:  Diagnosis Date  . Allergy   . Anemia   . Anxiety   . Bronchiectasis ([place]) 10/01/2015  . Cataract    removed both eyes  . Depression   . Diabetes mellitus without complication ([place])    diet controlled DM- no meds   . Diverticulosis of colon (without mention of hemorrhage) 07/17/2007  . Gastritis 07/17/2007  . GERD (gastroesophageal reflux disease)   . Glaucoma   . History of fatty infiltration of liver   . Hyperlipemia   . Hyperparathyroidism  ([place])   . Hypertension    under control   . Lumbosacral root lesions, not elsewhere classified 04/13/2014  . Obese   . Osteoarthritis   . Osteoporosis   . PONV (postoperative nausea and vomiting)    Not in last several surgeries  . Renal calculus, right    severe  . Sleep apnea    uses cpap at night   . Thyroid disease    Past Surgical History:  Procedure Laterality Date  . APPENDECTOMY  1974  . BREAST BIOPSY  1988   LEFT--BENIGN   . CESAREAN SECTION  1974  . CESAREAN SECTION  1971  . CHOLECYSTECTOMY  2000  . COLONOSCOPY  2008  . ESOPHAGOGASTRODUODENOSCOPY  2008  . EYE SURGERY     Lasik, Cataract, Hx of Glaucoma  . KNEE ARTHROPLASTY  2004   RIGHT-TOTAL   . LUMBAR FUSION  2008  . PARATHYROIDECTOMY  2009   X 2--RIGHT INFERIOR AND LEFT SUPERIOR  . PARATHYROIDECTOMY N/A 01/04/2017   Procedure: RIGHT NECK EXPLORATION WITH PARATHYROIDECTOMY;  Surgeon: Armandina Gemma, MD;  Location: [facility];  Service: General;  Laterality: N/A;  . ROTATOR CUFF REPAIR  2000   RIGHT  . TUBAL LIGATION  1974  . UPPER GASTROINTESTINAL ENDOSCOPY       A IV Location/Drains/Wounds Patient Lines/Drains/Airways Status   Active Line/Drains/Airways    Name:   Placement date:   Placement time:   Site:   Days:   Peripheral IV  01/29/19 Right Forearm   01/29/19    0130    Forearm   92   Peripheral IV 01/29/19 Left Forearm   01/29/19    0140    Forearm   92   Peripheral IV 05/01/19 Right Antecubital   05/01/19    1925    Antecubital   less than 1   Incision (Closed) 01/04/17 Neck   01/04/17    1056     847          Intake/Output Last 24 hours No intake or output data in the 24 hours ending 05/01/19 2149  Labs/Imaging Results for orders placed or performed during the hospital encounter of 05/01/19 (from the past 48 hour(s))  Lipase, blood     Status: None   Collection Time: 05/01/19  3:24 PM  Result Value Ref Range   Lipase 30 11 - 51 U/L    Comment: Performed at [facility], [place]  [address] [place], [place] [address]  Comprehensive metabolic panel     Status: Abnormal   Collection Time: 05/01/19  3:24 PM  Result Value Ref Range   Sodium 137 135 - 145 mmol/L   Potassium 3.7 3.5 - 5.1 mmol/L   Chloride 97 (L) 98 - 111 mmol/L   CO2 28 22 - 32 mmol/L   Glucose, Bld 126 (H) 70 - 99 mg/dL   BUN 33 (H) 8 - 23 mg/dL   Creatinine, Ser 2.04 (H) 0.44 - 1.00 mg/dL   Calcium 10.8 (H) 8.9 - 10.3 mg/dL   Total Protein 6.4 (L) 6.5 - 8.1 g/dL   Albumin 3.3 (L) 3.5 - 5.0 g/dL   AST 32 15 - 41 U/L   ALT 24 0 - 44 U/L   Alkaline Phosphatase 105 38 - 126 U/L   Total Bilirubin 0.9 0.3 - 1.2 mg/dL   GFR calc non Af Amer 22 (L) >60 mL/min   GFR calc Af Amer 26 (L) >60 mL/min   Anion gap 12 5 - 15    Comment: Performed at [facility], [place] [address] [place], [place] [address]  CBC     Status: None   Collection Time: 05/01/19  3:24 PM  Result Value Ref Range   WBC 9.8 4.0 - 10.5 K/uL   RBC 4.83 3.87 - 5.11 MIL/uL   Hemoglobin 14.2 12.0 - 15.0 g/dL   HCT 45.6 36.0 - 46.0 %   MCV 94.4 80.0 - 100.0 fL   MCH 29.4 26.0 - 34.0 pg   MCHC 31.1 30.0 - 36.0 g/dL   RDW 14.8 11.5 - 15.5 %   Platelets 361 150 - 400 K/uL   nRBC 0.0 0.0 - 0.2 %    Comment: Performed at [facility], [place] [address], [place], [place] [address]  Magnesium     Status: None   Collection Time: 05/01/19  3:27 PM  Result Value Ref Range   Magnesium 2.1 1.7 - 2.4 mg/dL    Comment: Performed at [facility], [place] [address] [place], [place] [address]  SARS Coronavirus 2 (CEPHEID - Performed in [place] hospital lab), Hosp Order     Status: None   Collection Time: 05/01/19  3:46 PM   Specimen: Nasopharyngeal Swab  Result Value Ref Range   SARS Coronavirus 2 NEGATIVE NEGATIVE    Comment: (NOTE) If result is NEGATIVE SARS-CoV-2 target nucleic acids are NOT DETECTED. The SARS-CoV-2 RNA is generally detectable in upper and lower  respiratory specimens  during the acute phase of infection. The lowest  concentration of SARS-CoV-2 viral copies this assay can detect is 250  copies / mL. A negative result does not preclude SARS-CoV-2 infection  and should not be used as the sole basis for treatment or other  patient management decisions.  A negative result may occur with  improper specimen collection / handling, submission of specimen other  than nasopharyngeal swab, presence of viral mutation(s) within the  areas targeted by this assay, and inadequate number of viral copies  (<250 copies / mL). A negative result must be combined with clinical  observations, patient history, and epidemiological information. If result is POSITIVE SARS-CoV-2 target nucleic acids are DETECTED. The SARS-CoV-2 RNA is generally detectable in upper and lower  respiratory specimens dur ing the acute phase of infection.  Positive  results are indicative of active infection with SARS-CoV-2.  Clinical  correlation with patient history and other diagnostic information is  necessary to determine patient infection status.  Positive results do  not rule out bacterial infection or co-infection with other viruses. If result is PRESUMPTIVE POSTIVE SARS-CoV-2 nucleic acids MAY BE PRESENT.   A presumptive positive result was obtained on the submitted specimen  and confirmed on repeat testing.  While 2019 novel coronavirus  (SARS-CoV-2) nucleic acids may be present in the submitted sample  additional confirmatory testing may be necessary for epidemiological  and / or clinical management purposes  to differentiate between  SARS-CoV-2 and other Sarbecovirus currently known to infect humans.  If clinically indicated additional testing with an alternate test  methodology [id number]) is advised. The SARS-CoV-2 RNA is generally  detectable in upper and lower respiratory sp ecimens during the acute  phase of infection. The expected result is Negative. Fact Sheet for Patients:   StrictlyIdeas.no Fact Sheet for Healthcare Providers: BankingDealers.co.za This test is not yet approved or cleared by the [place] FDA and has been authorized for detection and/or diagnosis of SARS-CoV-2 by FDA under an Emergency Use Authorization (EUA).  This EUA will remain in effect (meaning this test can be used) for the duration of the COVID-19 declaration under Section 564(b)(1) of the Act, 21 U.S.C. section 360bbb-3(b)(1), unless the authorization is terminated or revoked sooner. Performed at [facility], [place] [address], [place], [place] [address]    Ct Abdomen Pelvis Wo Contrast  Result Date: 05/01/2019 CLINICAL DATA:  Abdominal pain, question diverticulitis patient had endoscopy and said she had a blockage entire duodenum EXAM: CT ABDOMEN AND PELVIS WITHOUT CONTRAST TECHNIQUE: Multidetector CT imaging of the abdomen and pelvis was performed following the standard protocol without IV contrast. COMPARISON:  January 28, 2019 FINDINGS: Lower chest: Mitral valve calcifications are seen. The visualized heart is normal in size. No hiatal hernia. The visualized portions of the lungs are clear. Hepatobiliary: Although limited due to the lack of intravenous contrast, normal in appearance without gross focal abnormality. The patient is status post cholecystectomy. No biliary ductal dilation. Pancreas:  Unremarkable.  No surrounding inflammatory changes. Spleen: Normal in size. Although limited due to the lack of intravenous contrast, normal in appearance. Adrenals/Urinary Tract: Both adrenal glands appear normal. Again noted are multiple low-density lesions seen within both kidneys the largest seen in the lower pole of the right kidney measuring 3.6 cm. There is a slightly hyperdense lesion seen within the posterior midpole of the left kidney measuring 1 cm as on the prior exam and could represent proteinaceous/hemorrhagic cyst. The  remainder are likely simple  renal cysts. Again noted are multiple bilateral non-obstructing renal calculi. No perinephric stranding is seen. No ureteral or bladder calculi are noted. Stomach/Bowel: There is a dilated fluid and air-filled stomach to the level of the gastroduodenal junction where there is an gradual tapering. There is air and fluid seen within the third portion of the duodenum. The remainder of the small bowel is grossly normal in appearance. There is sigmoid colonic diverticula with question of minimal stranding seen adjacent to the sigmoid colon, series 2, image 66 which could represent mild sigmoid colonic diverticulitis. No pericolonic collection or free air is seen. Vascular/Lymphatic: There are no enlarged abdominal or pelvic lymph nodes. Scattered aortic atherosclerotic calcifications are seen without aneurysmal dilatation. Reproductive: The uterus and adnexa are unremarkable. Other: No evidence of abdominal wall mass or hernia. Musculoskeletal: No acute or significant osseous findings. The patient is status post decompression and fixation from L2 through L4. IMPRESSION: 1. Dilated fluid and air-filled stomach to the level of the gastric duodenal junction, consistent with the patient's history and recent endoscopy. There is limited visualization of the second portion of the duodenum to the lack of oral contrast. If there is question of gastric motility issues would recommend gastric emptying study. 2. Mild sigmoid colonic diverticulitis. No pericolonic abscess or free air Electronically Signed   By: Prudencio Pair M.D.   On: 05/01/2019 17:00    Pending Labs Unresulted Labs (From admission, onward)    Start     Ordered   05/01/19 1545  Gastrointestinal Panel by PCR , Stool  (Gastrointestinal Panel by PCR, Stool)  Once,   STAT     05/01/19 1544   05/01/19 1545  C Difficile Quick Screen w PCR reflex  (Gastrointestinal Panel by PCR, Stool)  Once, for 24 hours,   STAT     05/01/19 1544    05/01/19 1448  Urinalysis, Routine w reflex microscopic  ONCE - STAT,   STAT     05/01/19 1447   Signed and Held  CBC  (heparin)  Once,   R    Comments: Baseline for heparin therapy IF NOT ALREADY DRAWN.  Notify MD if PLT < 100 K.    Signed and Held   Signed and Held  Creatinine, serum  (heparin)  Once,   R    Comments: Baseline for heparin therapy IF NOT ALREADY DRAWN.    Signed and Held   Signed and Held  Comprehensive metabolic panel  Tomorrow morning,   R     Signed and Held   Signed and Held  CBC  Tomorrow morning,   R     Signed and Held          Vitals/Pain Today's Vitals   05/01/19 1647 05/01/19 1700 05/01/19 1844 05/01/19 2100  BP: (!) 128/57 (!) 100/56 109/85 103/82  Pulse: 70 69 73 72  Resp: 15 14 16 16   Temp:      TempSrc:      SpO2: 96% 94% 95% 93%  PainSc:        Isolation Precautions Enteric precautions (UV disinfection)  Medications Medications  sodium chloride flush (NS) 0.9 % injection 3 mL (3 mLs Intravenous Not Given 05/01/19 1728)  ondansetron (ZOFRAN-ODT) disintegrating tablet 4 mg (4 mg Oral Given 05/01/19 1449)  famotidine (PEPCID) IVPB 20 mg premix (0 mg Intravenous Stopped 05/01/19 1959)  sodium chloride 0.9 % bolus 1,000 mL (1,000 mLs Intravenous New Bag/Given 05/01/19 1931)    Mobility walks Low fall risk  R Recommendations: See Admitting Provider Note  Report given to: Bethany RN  Additional Notes:

## 2019-05-01 NOTE — Telephone Encounter (Signed)
Yes, we need to get her to ER if her PO intake is not adequate and she is getting more tired and is failing to thrive at home. Please advise him to bring her to [facility] ER, so we can expedite work up as inpatient. Thanks

## 2019-05-01 NOTE — ED Notes (Signed)
Family at bedside. 

## 2019-05-01 NOTE — Telephone Encounter (Signed)
Spoke with the patient's spouse. He has her downstairs on the sofa. She is too weak to climb the stairs to the bedroom. He understands to take her to the ED. He will take the young grandchild home so he is able to take her to the ED. He expects to get her there to the ED by 1 pm.  PA notified.

## 2019-05-01 NOTE — ED Notes (Signed)
Pt aware that stool sample is needed.  Bedside commode at bedside.

## 2019-05-01 NOTE — H&P (Signed)
History and Physical   [name] OYD:[id number] DOB: 1934-12-09 DOA: 05/01/2019  Referring MD/NP/PA: Dr. Tyrone Nine  PCP: Burnard Bunting, MD   Outpatient Specialists: Dr. Silverio Decamp, gastroenterology  Patient coming from: Home  Chief Complaint: Nausea vomiting with diarrhea  HPI: [name] is a 84 y.o. female with medical history significant of diabetes, GERD, hypertension, morbid obesity, hyperlipidemia, diverticulosis, hyperparathyroidism, anxiety disorder and bronchiectasis who has been having recurrent and persistent nausea with vomiting.  Patient had EGD 3 days ago with no evidence of cause of her symptoms.  She has progressively worsened not able to keep any liquids down now.  She was initially not able to keep solids down only liquids.  Patient also had bouts of diarrhea this morning.  It was nonbloody.  She denied any hematemesis.  She has cramping but no significant abdominal pain.  Patient has been communicating with her gastroenterologist office since her procedure.  With symptoms persistent and she is failing to do well at home she was advised to come to the emergency room.  She has no fever or chills.  Patient is being admitted having failed outpatient treatment and will be seen by GI for possible work-up of gastric outlet obstruction..  ED Course: Temperature is 98.5 blood pressure 90/55, pulse 76 respiratory rate of 18 oxygen sat 93% on room air.  She has a white count of 9.8 hemoglobin 14.2 and platelets 361.  Sodium 137 potassium 3.7 chloride 97 CO2 28 and BUN 33.  Creatinine is 2.04 and calcium 10.8.  CT abdomen pelvis showed dilated fluids and air-filled stomach to the level of the gastroduodenal junction.  Limited visualization of the second portion of the duodenum.  Recommendation is possible gastric emptying study.  Also mild sigmoid colonic diverticulitis.  Patient will be admitted to the hospital therefore for further work-up.  Review of Systems: As per HPI otherwise 10  point review of systems negative.    Past Medical History:  Diagnosis Date   Allergy    Anemia    Anxiety    Bronchiectasis ([place]) 10/01/2015   Cataract    removed both eyes   Depression    Diabetes mellitus without complication ([place])    diet controlled DM- no meds    Diverticulosis of colon (without mention of hemorrhage) 07/17/2007   Gastritis 07/17/2007   GERD (gastroesophageal reflux disease)    Glaucoma    History of fatty infiltration of liver    Hyperlipemia    Hyperparathyroidism ([place])    Hypertension    under control    Lumbosacral root lesions, not elsewhere classified 04/13/2014   Obese    Osteoarthritis    Osteoporosis    PONV (postoperative nausea and vomiting)    Not in last several surgeries   Renal calculus, right    severe   Sleep apnea    uses cpap at night    Thyroid disease     Past Surgical History:  Procedure Laterality Date   [place]   LEFT--BENIGN    [place]   COLONOSCOPY  2008   ESOPHAGOGASTRODUODENOSCOPY  2008   EYE SURGERY     Lasik, Cataract, Hx of Glaucoma   KNEE ARTHROPLASTY  2004   RIGHT-TOTAL    LUMBAR FUSION  2008   PARATHYROIDECTOMY  2009   X 2--RIGHT INFERIOR AND LEFT SUPERIOR   PARATHYROIDECTOMY N/A 01/04/2017   Procedure:  RIGHT NECK EXPLORATION WITH PARATHYROIDECTOMY;  Surgeon: Armandina Gemma, MD;  Location: [facility];  Service: General;  Laterality: N/A;   ROTATOR CUFF REPAIR  2000   RIGHT   TUBAL LIGATION  1974   UPPER GASTROINTESTINAL ENDOSCOPY       reports that she quit smoking about 4 years ago. Her smoking use included cigarettes. She has a 3.75 pack-year smoking history. She has never used smokeless tobacco. She reports current alcohol use of about 1.0 standard drinks of alcohol per week. She reports that she does not use drugs.  Allergies  Allergen Reactions   Codeine Nausea And  Vomiting   Contrast Media [Iodinated Diagnostic Agents] Hives, Shortness Of Breath and Itching   Iohexol Hives and Other (See Comments)    REACTION: " TROUBLE BREATHING"   Aspirin     In high doses bleeding   Fentanyl     Altered mental state    Family History  Problem Relation Age of Onset   Colon polyps Mother    Hypertension Mother    Osteoarthritis Mother    Diabetes Father    Stroke Father    Hernia Father        Esophageal hernia   Osteoarthritis Maternal Grandmother    Stroke Maternal Grandfather    Liver disease Paternal Grandmother    Stroke Paternal Grandfather    Colon cancer Neg Hx    Esophageal cancer Neg Hx    Rectal cancer Neg Hx    Stomach cancer Neg Hx      Prior to Admission medications   Medication Sig Start Date End Date Taking? Authorizing Provider  albuterol (VENTOLIN HFA) 108 (90 Base) MCG/ACT inhaler Inhale 2 puffs into the lungs every 6 (six) hours as needed for wheezing or shortness of breath. 01/30/19   Mariel Aloe, MD  benzonatate (TESSALON) 100 MG capsule Take by mouth 3 (three) times daily as needed for cough.    [provider]  cefdinir (OMNICEF) 300 MG capsule Take 1 capsule (300 mg total) by mouth 2 (two) times daily. Patient not taking: Reported on 04/01/2019 02/25/19   Mauri Pole, MD  dorzolamide (TRUSOPT) 2 % ophthalmic solution Place 1 drop into both eyes 2 (two) times daily.  11/15/15   [provider]  DULoxetine (CYMBALTA) 30 MG capsule Take 30 mg by mouth daily.    [provider]  esomeprazole (NEXIUM) 40 MG capsule Take 40 mg by mouth daily before breakfast.     [provider]  fluticasone (FLONASE) 50 MCG/ACT nasal spray Place 2 sprays into both nostrils daily. Patient taking differently: Place 2 sprays into both nostrils at bedtime as needed for allergies.  09/01/15   Skeet Latch, MD  gabapentin (NEURONTIN) 100 MG capsule Take 100 mg by mouth at bedtime. 11/21/18    [provider]  ibuprofen (ADVIL) 200 MG tablet Take by mouth.    [provider]  levothyroxine (SYNTHROID) 50 MCG tablet Take 50 mcg by mouth daily. 12/18/18   [provider]  loperamide (IMODIUM) 2 MG capsule Take by mouth.    [provider]  losartan (COZAAR) 100 MG tablet TAKE 1 TABLET EACH DAY. Patient taking differently: Take 100 mg by mouth daily.  07/24/16   Skeet Latch, MD  metroNIDAZOLE (FLAGYL) 500 MG tablet Take 1 tablet (500 mg total) by mouth 3 (three) times daily. 02/25/19   Mauri Pole, MD  Multiple Vitamins-Minerals (PRESERVISION AREDS 2 PO) Take 1 capsule by mouth 2 (two)  times daily.    [provider]  ondansetron (ZOFRAN) 4 MG tablet Take 4 mg by mouth 4 (four) times daily as needed for nausea/vomiting. 01/25/19   [provider]  PROTONIX 40 MG tablet  03/18/19   [provider]  ROCKLATAN 0.02-0.005 % SOLN Place 1 drop into both eyes at bedtime. 01/08/19   [provider]  saccharomyces boulardii (FLORASTOR) 250 MG capsule Take 1 capsule (250 mg total) by mouth 2 (two) times daily. 1 twice daily for 21 days Patient not taking: Reported on 04/28/2019 02/13/19   Mauri Pole, MD  sertraline (ZOLOFT) 50 MG tablet  03/24/19   [provider]  triamterene-hydrochlorothiazide (MAXZIDE-25) 37.5-25 MG tablet Take 1 tablet by mouth as directed. One whole tablet daily alternating with 1/2 tablet daily. 11/13/15   [provider]  zoledronic acid (RECLAST) 5 MG/100ML SOLN injection Inject 5 mg into the vein yearly. [facility] June 2015)    [provider]    Physical Exam: Vitals:   05/01/19 1630 05/01/19 1647 05/01/19 1700 05/01/19 1844  BP: (!) 128/57 (!) 128/57 (!) 100/56 109/85  Pulse: 65 70 69 73  Resp:  15 14 16   Temp:      TempSrc:      SpO2: 96% 96% 94% 95%      Constitutional: Uncomfortable in mild distress Vitals:   05/01/19 1630 05/01/19 1647 05/01/19  1700 05/01/19 1844  BP: (!) 128/57 (!) 128/57 (!) 100/56 109/85  Pulse: 65 70 69 73  Resp:  15 14 16   Temp:      TempSrc:      SpO2: 96% 96% 94% 95%   Eyes: PERRL, lids and conjunctivae normal ENMT: Mucous membranes are dry. Posterior pharynx clear of any exudate or lesions.Normal dentition.  Neck: normal, supple, no masses, no thyromegaly Respiratory: clear to auscultation bilaterally, no wheezing, no crackles. Normal respiratory effort. No accessory muscle use.  Cardiovascular: Regular rate and rhythm, no murmurs / rubs / gallops. No extremity edema. 2+ pedal pulses. No carotid bruits.  Abdomen: Distended abdomen, tympanitic, diffuse tenderness, bowel sounds positive.  Musculoskeletal: no clubbing / cyanosis. No joint deformity upper and lower extremities. Good ROM, no contractures. Normal muscle tone.  Skin: no rashes, lesions, ulcers. No induration Neurologic: CN 2-12 grossly intact. Sensation intact, DTR normal. Strength 5/5 in all 4.  Psychiatric: Normal judgment and insight. Alert and oriented x 3. Normal mood.     Labs on Admission: I have personally reviewed following labs and imaging studies  CBC: Recent Labs  Lab 05/01/19 1524  WBC 9.8  HGB 14.2  HCT 45.6  MCV 94.4  PLT 785   Basic Metabolic Panel: Recent Labs  Lab 05/01/19 1524 05/01/19 1527  NA 137  --   K 3.7  --   CL 97*  --   CO2 28  --   GLUCOSE 126*  --   BUN 33*  --   CREATININE 2.04*  --   CALCIUM 10.8*  --   MG  --  2.1   GFR: Estimated Creatinine Clearance: 24.3 mL/min (A) (by C-G formula based on SCr of 2.04 mg/dL (H)). Liver Function Tests: Recent Labs  Lab 05/01/19 1524  AST 32  ALT 24  ALKPHOS 105  BILITOT 0.9  PROT 6.4*  ALBUMIN 3.3*   Recent Labs  Lab 05/01/19 1524  LIPASE 30   No results for input(s): AMMONIA in the last 168 hours. Coagulation Profile: No results for input(s): INR, PROTIME in the  last 168 hours. Cardiac Enzymes: No results for input(s): CKTOTAL, CKMB,  CKMBINDEX, TROPONINI in the last 168 hours. BNP (last 3 results) No results for input(s): PROBNP in the last 8760 hours. HbA1C: No results for input(s): HGBA1C in the last 72 hours. CBG: No results for input(s): GLUCAP in the last 168 hours. Lipid Profile: No results for input(s): CHOL, HDL, LDLCALC, TRIG, CHOLHDL, LDLDIRECT in the last 72 hours. Thyroid Function Tests: No results for input(s): TSH, T4TOTAL, FREET4, T3FREE, THYROIDAB in the last 72 hours. Anemia Panel: No results for input(s): VITAMINB12, FOLATE, FERRITIN, TIBC, IRON, RETICCTPCT in the last 72 hours. Urine analysis:    Component Value Date/Time   COLORURINE STRAW (A) 01/28/2019 1646   APPEARANCEUR CLEAR 01/28/2019 1646   LABSPEC 1.006 01/28/2019 1646   PHURINE 5.0 01/28/2019 1646   GLUCOSEU NEGATIVE 01/28/2019 1646   HGBUR NEGATIVE 01/28/2019 1646   BILIRUBINUR NEGATIVE 01/28/2019 1646   KETONESUR NEGATIVE 01/28/2019 1646   PROTEINUR NEGATIVE 01/28/2019 1646   UROBILINOGEN 0.2 06/01/2010 0915   NITRITE NEGATIVE 01/28/2019 1646   LEUKOCYTESUR NEGATIVE 01/28/2019 1646   Sepsis Labs: @LABRCNTIP (procalcitonin:4,lacticidven:4) ) Recent Results (from the past 240 hour(s))  SARS Coronavirus 2 (CEPHEID - Performed in [place] hospital lab), Hosp Order     Status: None   Collection Time: 05/01/19  3:46 PM   Specimen: Nasopharyngeal Swab  Result Value Ref Range Status   SARS Coronavirus 2 NEGATIVE NEGATIVE Final    Comment: (NOTE) If result is NEGATIVE SARS-CoV-2 target nucleic acids are NOT DETECTED. The SARS-CoV-2 RNA is generally detectable in upper and lower  respiratory specimens during the acute phase of infection. The lowest  concentration of SARS-CoV-2 viral copies this assay can detect is 250  copies / mL. A negative result does not preclude SARS-CoV-2 infection  and should not be used as the sole basis for treatment or other  patient management decisions.  A negative result may occur with  improper  specimen collection / handling, submission of specimen other  than nasopharyngeal swab, presence of viral mutation(s) within the  areas targeted by this assay, and inadequate number of viral copies  (<250 copies / mL). A negative result must be combined with clinical  observations, patient history, and epidemiological information. If result is POSITIVE SARS-CoV-2 target nucleic acids are DETECTED. The SARS-CoV-2 RNA is generally detectable in upper and lower  respiratory specimens dur ing the acute phase of infection.  Positive  results are indicative of active infection with SARS-CoV-2.  Clinical  correlation with patient history and other diagnostic information is  necessary to determine patient infection status.  Positive results do  not rule out bacterial infection or co-infection with other viruses. If result is PRESUMPTIVE POSTIVE SARS-CoV-2 nucleic acids MAY BE PRESENT.   A presumptive positive result was obtained on the submitted specimen  and confirmed on repeat testing.  While 2019 novel coronavirus  (SARS-CoV-2) nucleic acids may be present in the submitted sample  additional confirmatory testing may be necessary for epidemiological  and / or clinical management purposes  to differentiate between  SARS-CoV-2 and other Sarbecovirus currently known to infect humans.  If clinically indicated additional testing with an alternate test  methodology [phone]) is advised. The SARS-CoV-2 RNA is generally  detectable in upper and lower respiratory sp ecimens during the acute  phase of infection. The expected result is Negative. Fact Sheet for Patients:  StrictlyIdeas.no Fact Sheet for Healthcare Providers: BankingDealers.co.za This test is not yet approved or cleared by the [place]  States FDA and has been authorized for detection and/or diagnosis of SARS-CoV-2 by FDA under an Emergency Use Authorization (EUA).  This EUA will remain in  effect (meaning this test can be used) for the duration of the COVID-19 declaration under Section 564(b)(1) of the Act, 21 U.S.C. section 360bbb-3(b)(1), unless the authorization is terminated or revoked sooner. Performed at [facility], [place] [address] [place], [place] [address]      Radiological Exams on Admission: Ct Abdomen Pelvis Wo Contrast  Result Date: 05/01/2019 CLINICAL DATA:  Abdominal pain, question diverticulitis patient had endoscopy and said she had a blockage entire duodenum EXAM: CT ABDOMEN AND PELVIS WITHOUT CONTRAST TECHNIQUE: Multidetector CT imaging of the abdomen and pelvis was performed following the standard protocol without IV contrast. COMPARISON:  January 28, 2019 FINDINGS: Lower chest: Mitral valve calcifications are seen. The visualized heart is normal in size. No hiatal hernia. The visualized portions of the lungs are clear. Hepatobiliary: Although limited due to the lack of intravenous contrast, normal in appearance without gross focal abnormality. The patient is status post cholecystectomy. No biliary ductal dilation. Pancreas:  Unremarkable.  No surrounding inflammatory changes. Spleen: Normal in size. Although limited due to the lack of intravenous contrast, normal in appearance. Adrenals/Urinary Tract: Both adrenal glands appear normal. Again noted are multiple low-density lesions seen within both kidneys the largest seen in the lower pole of the right kidney measuring 3.6 cm. There is a slightly hyperdense lesion seen within the posterior midpole of the left kidney measuring 1 cm as on the prior exam and could represent proteinaceous/hemorrhagic cyst. The remainder are likely simple renal cysts. Again noted are multiple bilateral non-obstructing renal calculi. No perinephric stranding is seen. No ureteral or bladder calculi are noted. Stomach/Bowel: There is a dilated fluid and air-filled stomach to the level of the gastroduodenal junction where there  is an gradual tapering. There is air and fluid seen within the third portion of the duodenum. The remainder of the small bowel is grossly normal in appearance. There is sigmoid colonic diverticula with question of minimal stranding seen adjacent to the sigmoid colon, series 2, image 66 which could represent mild sigmoid colonic diverticulitis. No pericolonic collection or free air is seen. Vascular/Lymphatic: There are no enlarged abdominal or pelvic lymph nodes. Scattered aortic atherosclerotic calcifications are seen without aneurysmal dilatation. Reproductive: The uterus and adnexa are unremarkable. Other: No evidence of abdominal wall mass or hernia. Musculoskeletal: No acute or significant osseous findings. The patient is status post decompression and fixation from L2 through L4. IMPRESSION: 1. Dilated fluid and air-filled stomach to the level of the gastric duodenal junction, consistent with the patient's history and recent endoscopy. There is limited visualization of the second portion of the duodenum to the lack of oral contrast. If there is question of gastric motility issues would recommend gastric emptying study. 2. Mild sigmoid colonic diverticulitis. No pericolonic abscess or free air Electronically Signed   By: Prudencio Pair M.D.   On: 05/01/2019 17:00      Assessment/Plan Principal Problem:   Nausea and/or vomiting Active Problems:   ANXIETY DEPRESSION   Essential hypertension   Hyperparathyroidism, primary (HCC)   OSA (obstructive sleep apnea)   Hypothyroid     #1 persistent nausea with vomiting: Suspected gastric outlet obstruction.  Patient will be admitted to the hospital.  GI will follow with the patient.  Gastric emptying study will be ordered.  Further care according to GI.  #2 hypertension: We will continue his  home regimen.  #3 GERD: Continue with PPIs  #4 primary hyperparathyroidism: Stable.  No change in treatment.  #5 hypothyroidism: Continue replacement  therapy.  #6 obstructive sleep apnea: CPAP at night.   DVT prophylaxis: Lovenox Code Status: Full code Family Communication: No family at bedside Disposition Plan: Home Consults called: Dr. Silverio Decamp gastroenterology Admission status: Inpatient  Severity of Illness: The appropriate patient status for this patient is INPATIENT. Inpatient status is judged to be reasonable and necessary in order to provide the required intensity of service to ensure the patient's safety. The patient's presenting symptoms, physical exam findings, and initial radiographic and laboratory data in the context of their chronic comorbidities is felt to place them at high risk for further clinical deterioration. Furthermore, it is not anticipated that the patient will be medically stable for discharge from the hospital within 2 midnights of admission. The following factors support the patient status of inpatient.   " The patient's presenting symptoms include nausea vomiting with diarrhea. " The worrisome physical exam findings include distended abdomen. " The initial radiographic and laboratory data are worrisome because of air-fluid levels on CT scan. " The chronic co-morbidities include GERD.   * I certify that at the point of admission it is my clinical judgment that the patient will require inpatient hospital care spanning beyond 2 midnights from the point of admission due to high intensity of service, high risk for further deterioration and high frequency of surveillance required.Barbette Merino MD [facility] Pager [phone]  If 7PM-7AM, please contact night-coverage [email or web address] Password [email or web address]  05/01/2019, 6:58 PM

## 2019-05-02 ENCOUNTER — Telehealth: Payer: Self-pay | Admitting: *Deleted

## 2019-05-02 DIAGNOSIS — R112 Nausea with vomiting, unspecified: Secondary | ICD-10-CM

## 2019-05-02 DIAGNOSIS — R933 Abnormal findings on diagnostic imaging of other parts of digestive tract: Secondary | ICD-10-CM

## 2019-05-02 LAB — CBC
HCT: 40.1 % (ref 36.0–46.0)
Hemoglobin: 12.4 g/dL (ref 12.0–15.0)
MCH: 29.8 pg (ref 26.0–34.0)
MCHC: 30.9 g/dL (ref 30.0–36.0)
MCV: 96.4 fL (ref 80.0–100.0)
Platelets: 273 10*3/uL (ref 150–400)
RBC: 4.16 MIL/uL (ref 3.87–5.11)
RDW: 14.8 % (ref 11.5–15.5)
WBC: 8.2 10*3/uL (ref 4.0–10.5)
nRBC: 0 % (ref 0.0–0.2)

## 2019-05-02 LAB — COMPREHENSIVE METABOLIC PANEL
ALT: 20 U/L (ref 0–44)
AST: 24 U/L (ref 15–41)
Albumin: 2.4 g/dL — ABNORMAL LOW (ref 3.5–5.0)
Alkaline Phosphatase: 84 U/L (ref 38–126)
Anion gap: 10 (ref 5–15)
BUN: 30 mg/dL — ABNORMAL HIGH (ref 8–23)
CO2: 26 mmol/L (ref 22–32)
Calcium: 9.7 mg/dL (ref 8.9–10.3)
Chloride: 101 mmol/L (ref 98–111)
Creatinine, Ser: 1.89 mg/dL — ABNORMAL HIGH (ref 0.44–1.00)
GFR calc Af Amer: 28 mL/min — ABNORMAL LOW (ref >60)
GFR calc non Af Amer: 24 mL/min — ABNORMAL LOW (ref >60)
Glucose, Bld: 137 mg/dL — ABNORMAL HIGH (ref 70–99)
Potassium: 3.8 mmol/L (ref 3.5–5.1)
Sodium: 137 mmol/L (ref 135–145)
Total Bilirubin: 0.4 mg/dL (ref 0.3–1.2)
Total Protein: 4.9 g/dL — ABNORMAL LOW (ref 6.5–8.1)

## 2019-05-02 LAB — URINALYSIS, ROUTINE W REFLEX MICROSCOPIC
Bilirubin Urine: NEGATIVE
Glucose, UA: NEGATIVE mg/dL
Ketones, ur: NEGATIVE mg/dL
Nitrite: NEGATIVE
Protein, ur: NEGATIVE mg/dL
Specific Gravity, Urine: 1.017 (ref 1.005–1.030)
pH: 5 (ref 5.0–8.0)

## 2019-05-02 MED ORDER — SODIUM CHLORIDE 0.9% FLUSH
10.0000 mL | Freq: Two times a day (BID) | INTRAVENOUS | Status: DC
  Administered 2019-05-02 – 2019-05-04 (×3): 10 mL

## 2019-05-02 MED ORDER — ALUM & MAG HYDROXIDE-SIMETH 200-200-20 MG/5ML PO SUSP
15.0000 mL | ORAL | Status: DC | PRN
  Administered 2019-05-02: 15 mL via ORAL
  Filled 2019-05-02: qty 30

## 2019-05-02 MED ORDER — SODIUM CHLORIDE 0.9% FLUSH: 10.0000 mL | INTRAVENOUS | Status: DC | PRN

## 2019-05-02 MED ORDER — PANTOPRAZOLE SODIUM 40 MG IV SOLR
40.0000 mg | Freq: Two times a day (BID) | INTRAVENOUS | Status: DC
  Administered 2019-05-02 – 2019-05-15 (×25): 40 mg via INTRAVENOUS
  Filled 2019-05-02 (×24): qty 40

## 2019-05-02 NOTE — Consult Note (Addendum)
Consultation  Referring Provider: TRH/Patel MD Primary Care Physician:  Burnard Bunting, MD Primary Gastroenterologist:  Dr.Nandigam  Reason for Consultation: Probable gastric outlet obstruction  HPI: [name] is a 84 y.o. female, known to Dr. Silverio Decamp, who was admitted through the emergency room last evening wi and is status post appendectomy cholecystectomy and C-section. She has history of th persistent nausea, intermittent vomiting, weight loss and episodes of diarrhea. Patient has not been doing well over the past 2 months when she developed loss of appetite, then nausea, and since has been having vomiting on an almost daily basis.  She has had some heartburn and indigestion but no dysphagia.  She denies any abdominal pain.  She is describing episodes of loose to watery stool which occur a couple of times per day generally after she has tried to eat solid food.  She has had a 20 pound weight loss over the past couple of months. Patient had undergone EGD with Dr. Rush Landmark on 04/28/2019 for further evaluation and was found to have diffuse edema and erythema of the entire stomach with a large amount of retained food and fluid in the stomach and into the duodenal bulb.  She was unable to traverse beyond D2. Patient was to be scheduled for upper GI and then possibly repeat EGD as an outpatient, however she has developed progressive weakness and failure to thrive and admission was advised.  She has history of bronchiectasis, adult onset diabetes mellitus, GERD, osteoarthritis, hypertension, hyperparathyroidism.  She was hospitalized in April 2020 with acute diverticulitis. She has generally been taking 4 ibuprofen per day chronically for arthritis symptoms.  CT scan today without IV contrast showed dilated fluid-filled and distended stomach to the gastroduodenal junction and with gradual tapering, she was noted to have multiple renal cysts and some mild stranding around the sigmoid  colon raising question of mild sigmoid diverticulitis.  Labs on admit, CBC within normal limits, creatinine 2 (baseline 1.3)  She is feeling a little bit better this morning, has taken some clear liquids Denies any nausea or vomiting thus far today.  Patient's husband participated in her conversation today via telephone, and has been updated.   Past Medical History:  Diagnosis Date  . Allergy   . Anemia   . Anxiety   . Bronchiectasis ([place]) 10/01/2015  . Cataract    removed both eyes  . Depression   . Diabetes mellitus without complication ([place])    diet controlled DM- no meds   . Diverticulosis of colon (without mention of hemorrhage) 07/17/2007  . Gastritis 07/17/2007  . GERD (gastroesophageal reflux disease)   . Glaucoma   . History of fatty infiltration of liver   . Hyperlipemia   . Hyperparathyroidism ([place])   . Hypertension    under control   . Lumbosacral root lesions, not elsewhere classified 04/13/2014  . Obese   . Osteoarthritis   . Osteoporosis   . PONV (postoperative nausea and vomiting)    Not in last several surgeries  . Renal calculus, right    severe  . Sleep apnea    uses cpap at night   . Thyroid disease     Past Surgical History:  Procedure Laterality Date  . APPENDECTOMY  1974  . BREAST BIOPSY  1988   LEFT--BENIGN   . CESAREAN SECTION  1974  . CESAREAN SECTION  1971  . CHOLECYSTECTOMY  2000  . COLONOSCOPY  2008  . ESOPHAGOGASTRODUODENOSCOPY  2008  . EYE SURGERY  Lasik, Cataract, Hx of Glaucoma  . KNEE ARTHROPLASTY  2004   RIGHT-TOTAL   . LUMBAR FUSION  2008  . PARATHYROIDECTOMY  2009   X 2--RIGHT INFERIOR AND LEFT SUPERIOR  . PARATHYROIDECTOMY N/A 01/04/2017   Procedure: RIGHT NECK EXPLORATION WITH PARATHYROIDECTOMY;  Surgeon: Armandina Gemma, MD;  Location: [facility];  Service: General;  Laterality: N/A;  . ROTATOR CUFF REPAIR  2000   RIGHT  . TUBAL LIGATION  1974  . UPPER GASTROINTESTINAL ENDOSCOPY      Prior to Admission medications    Medication Sig Start Date End Date Taking? Authorizing Provider  albuterol (VENTOLIN HFA) 108 (90 Base) MCG/ACT inhaler Inhale 2 puffs into the lungs every 6 (six) hours as needed for wheezing or shortness of breath. 01/30/19  Yes Mariel Aloe, MD  dorzolamide (TRUSOPT) 2 % ophthalmic solution Place 1 drop into both eyes 2 (two) times daily.  11/15/15  Yes [provider]  fluticasone (FLONASE) 50 MCG/ACT nasal spray Place 2 sprays into both nostrils daily. Patient taking differently: Place 2 sprays into both nostrils at bedtime as needed for allergies.  09/01/15  Yes Skeet Latch, MD  ibuprofen (ADVIL) 200 MG tablet Take 200-400 mg by mouth at bedtime as needed for moderate pain.    Yes [provider]  levothyroxine (SYNTHROID) 50 MCG tablet Take 50 mcg by mouth daily. 12/18/18  Yes [provider]  loperamide (IMODIUM) 2 MG capsule Take 2 mg by mouth as needed for diarrhea or loose stools.    Yes [provider]  losartan (COZAAR) 100 MG tablet TAKE 1 TABLET EACH DAY. Patient taking differently: Take 100 mg by mouth daily.  07/24/16  Yes Skeet Latch, MD  PROTONIX 40 MG tablet Take 40 mg by mouth daily.  03/18/19  Yes [provider]  ROCKLATAN 0.02-0.005 % SOLN Place 1 drop into both eyes at bedtime. 01/08/19  Yes [provider]  sertraline (ZOLOFT) 50 MG tablet Take 50 mg by mouth daily.  03/24/19  Yes [provider]  zoledronic acid (RECLAST) 5 MG/100ML SOLN injection Inject 5 mg into the vein yearly. (Start June 2015)   Yes [provider]  cefdinir (OMNICEF) 300 MG capsule Take 1 capsule (300 mg total) by mouth 2 (two) times daily. Patient not taking: Reported on 04/01/2019 02/25/19   Mauri Pole, MD  metroNIDAZOLE (FLAGYL) 500 MG tablet Take 1 tablet (500 mg total) by mouth 3 (three) times daily. Patient not taking: Reported on 05/01/2019 02/25/19   Mauri Pole, MD  saccharomyces boulardii  (FLORASTOR) 250 MG capsule Take 1 capsule (250 mg total) by mouth 2 (two) times daily. 1 twice daily for 21 days Patient not taking: Reported on 04/28/2019 02/13/19   Mauri Pole, MD  triamterene-hydrochlorothiazide (MAXZIDE-25) 37.5-25 MG tablet Take 1 tablet by mouth as directed. One whole tablet daily alternating with 1/2 tablet daily. 11/13/15   [provider]    Current Facility-Administered Medications  Medication Dose Route Frequency Provider Last Rate Last Dose  . chlorhexidine (PERIDEX) 0.12 % solution 15 mL  15 mL Mouth Rinse BID Gala Romney L, MD   15 mL at 05/01/19 2313  . dextrose 5 %-0.9 % sodium chloride infusion   Intravenous Continuous Elwyn Reach, MD 125 mL/hr at 05/02/19 4627    . heparin injection 5,000 Units  5,000 Units Subcutaneous Q8H Elwyn Reach, MD      . MEDLINE mouth rinse  15 mL Mouth Rinse q12n4p Gala Romney  L, MD      . ondansetron (ZOFRAN) tablet 4 mg  4 mg Oral Q6H PRN Elwyn Reach, MD       Or  . ondansetron (ZOFRAN) injection 4 mg  4 mg Intravenous Q6H PRN Gala Romney L, MD      . sodium chloride flush (NS) 0.9 % injection 3 mL  3 mL Intravenous Once Deno Etienne, DO        Allergies as of 05/01/2019 - Review Complete 05/01/2019  Allergen Reaction Noted  . Codeine Nausea And Vomiting 03/17/2014  . Contrast media [iodinated diagnostic agents] Hives, Shortness Of Breath, and Itching 05/16/2018  . Iohexol Hives and Other (See Comments) 02/21/2007  . Aspirin  06/30/2016  . Fentanyl  10/20/2016    Family History  Problem Relation Age of Onset  . Colon polyps Mother   . Hypertension Mother   . Osteoarthritis Mother   . Diabetes Father   . Stroke Father   . Hernia Father        Esophageal hernia  . Osteoarthritis Maternal Grandmother   . Stroke Maternal Grandfather   . Liver disease Paternal Grandmother   . Stroke Paternal Grandfather   . Colon cancer Neg Hx   . Esophageal cancer Neg Hx   . Rectal cancer  Neg Hx   . Stomach cancer Neg Hx     Social History   Socioeconomic History  . Marital status: Married    Spouse name: Not on file  . Number of children: 2  . Years of education: Not on file  . Highest education level: Not on file  Occupational History  . Occupation: retired  Scientific laboratory technician  . Financial resource strain: Not on file  . Food insecurity    Worry: Not on file    Inability: Not on file  . Transportation needs    Medical: Not on file    Non-medical: Not on file  Tobacco Use  . Smoking status: Former Smoker    Packs/day: 0.25    Years: 15.00    Pack years: 3.75    Types: Cigarettes    Quit date: 09/01/2014    Years since quitting: 4.6  . Smokeless tobacco: Never Used  Substance and Sexual Activity  . Alcohol use: Yes    Alcohol/week: 1.0 standard drinks    Types: 1 Glasses of wine per week    Comment: occasional  . Drug use: No  . Sexual activity: Not on file  Lifestyle  . Physical activity    Days per week: Not on file    Minutes per session: Not on file  . Stress: Not on file  Relationships  . Social Herbalist on phone: Not on file    Gets together: Not on file    Attends religious service: Not on file    Active member of club or organization: Not on file    Attends meetings of clubs or organizations: Not on file    Relationship status: Not on file  . Intimate partner violence    Fear of current or ex partner: Not on file    Emotionally abused: Not on file    Physically abused: Not on file    Forced sexual activity: Not on file  Other Topics Concern  . Not on file  Social History Narrative   Married, with 2 children   Right handed   Some college   No caffeine      Epworth Sleepiness Scale =  7 (as of 07/21/2015)       Review of Systems: Pertinent positive and negative review of systems were noted in the above HPI section.  All other review of systems was otherwise negative.  Physical Exam: Vital signs in last 24 hours: Temp:   [98.1 F (36.7 C)-98.5 F (36.9 C)] 98.2 F (36.8 C) (07/31 0456) Pulse Rate:  [65-76] 69 (07/31 0456) Resp:  [14-18] 16 (07/31 0456) BP: (90-128)/(50-90) 107/55 (07/31 0456) SpO2:  [90 %-100 %] 90 % (07/31 0456) Weight:  [94.6 kg] 94.6 kg (07/30 2238) Last BM Date: 05/01/19 General:   Alert,  Well-developed, elderly white female pleasant and cooperative in NAD Head:  Normocephalic and atraumatic. Eyes:  Sclera clear, no icterus.   Conjunctiva pink. Ears:  Normal auditory acuity. Nose:  No deformity, discharge,  or lesions. Mouth:  No deformity or lesions.   Neck:  Supple; no masses or thyromegaly. Lungs:  Clear throughout to auscultation.   No wheezes, crackles, or rhonchi. Heart:  Regular rate and rhythm; no murmurs, clicks, rubs,  or gallops. Abdomen:  Soft obese,,nontender, BS active,nonpalp mass or hsm.  There is some fullness in the epigastrium, no succussion splash heard Rectal:  Deferred  Msk:  Symmetrical without gross deformities. . Pulses:  Normal pulses noted. Extremities:  Without clubbing or edema. Neurologic:  Alert and  oriented x4;  grossly normal neurologically. Skin:  Intact without significant lesions or rashes.. Psych:  Alert and cooperative. Normal mood and affect.  Intake/Output from previous day: 07/30 0701 - 07/31 0700 In: 11048.6 [P.O.:120; I.V.:903.6; IV Piggyback:10025] Out: 200 [Urine:200] Intake/Output this shift: Total I/O In: 352.2 [P.O.:120; I.V.:232.2] Out: 200 [Urine:200]  Lab Results: Recent Labs    05/01/19 1524 05/02/19 0303  WBC 9.8 8.2  HGB 14.2 12.4  HCT 45.6 40.1  PLT 361 273   BMET Recent Labs    05/01/19 1524 05/02/19 0303  NA 137 137  K 3.7 3.8  CL 97* 101  CO2 28 26  GLUCOSE 126* 137*  BUN 33* 30*  CREATININE 2.04* 1.89*  CALCIUM 10.8* 9.7   LFT Recent Labs    05/02/19 0303  PROT 4.9*  ALBUMIN 2.4*  AST 24  ALT 20  ALKPHOS 84  BILITOT 0.4   PT/INR No results for input(s): LABPROT, INR in the last 72  hours. Hepatitis Panel No results for input(s): HEPBSAG, HCVAB, HEPAIGM, HEPBIGM in the last 72 hours.    IMPRESSION:  #86 84 year old white female with 65-month history of anorexia, nausea, vomiting, and 20 pound weight loss.  She is also been having intermittent episodes of diarrhea  EGD as an outpatient concerning for gastric outlet obstruction large amount of retained food and fluid and inability to traverse beyond D2 She also has a diffuse severe gastritis Rule out NSAID induced peptic ulcer disease, possible duodenal ulcer, edema or stricture causing outlet obstruction.  Rule out malignancy  #2 history of diverticulitis Noncontrasted CT scan yesterday noted a little bit of stranding around the sigmoid colon.  Clinically she does not have diverticulitis, there is no tenderness in the lower abdomen, and no fever or leukocytosis  #3 bronchiectasis 4.  Adult onset diabetes mellitus 5.  Hypertension 6.  Hyperparathyroidism 7.  Status post appendectomy cholecystectomy and C-section  8.  Acute kidney injury, on mild chronic kidney disease   Plan; Continue IV fluid hydration Hold Maxide IV PPI twice daily Sips of water only today and n.p.o. after midnight Patient will be scheduled for repeat EGD with Dr.  Mansouraty tomorrow morning. I do not think she needs to be on antibiotics for diverticulitis Stool studies are pending for her intermittent diarrhea, she does have history of IBS.  Thank you- will follow with you     Tavi Hoogendoorn EsterwoodPA-C  05/02/2019, 10:32 AM

## 2019-05-02 NOTE — Progress Notes (Signed)
Berle Mull, MD was paged regarding the pt's request for indigestion medication.

## 2019-05-02 NOTE — H&P (View-Only) (Signed)
Consultation  Referring Provider: TRH/Patel MD Primary Care Physician:  Burnard Bunting, MD Primary Gastroenterologist:  Dr.Nandigam  Reason for Consultation: Probable gastric outlet obstruction  HPI: [name] is a 84 y.o. female, known to Dr. Silverio Decamp, who was admitted through the emergency room last evening wi and is status post appendectomy cholecystectomy and C-section. She has history of th persistent nausea, intermittent vomiting, weight loss and episodes of diarrhea. Patient has not been doing well over the past 2 months when she developed loss of appetite, then nausea, and since has been having vomiting on an almost daily basis.  She has had some heartburn and indigestion but no dysphagia.  She denies any abdominal pain.  She is describing episodes of loose to watery stool which occur a couple of times per day generally after she has tried to eat solid food.  She has had a 20 pound weight loss over the past couple of months. Patient had undergone EGD with Dr. Rush Landmark on 04/28/2019 for further evaluation and was found to have diffuse edema and erythema of the entire stomach with a large amount of retained food and fluid in the stomach and into the duodenal bulb.  She was unable to traverse beyond D2. Patient was to be scheduled for upper GI and then possibly repeat EGD as an outpatient, however she has developed progressive weakness and failure to thrive and admission was advised.  She has history of bronchiectasis, adult onset diabetes mellitus, GERD, osteoarthritis, hypertension, hyperparathyroidism.  She was hospitalized in April 2020 with acute diverticulitis. She has generally been taking 4 ibuprofen per day chronically for arthritis symptoms.  CT scan today without IV contrast showed dilated fluid-filled and distended stomach to the gastroduodenal junction and with gradual tapering, she was noted to have multiple renal cysts and some mild stranding around the sigmoid  colon raising question of mild sigmoid diverticulitis.  Labs on admit, CBC within normal limits, creatinine 2 (baseline 1.3)  She is feeling a little bit better this morning, has taken some clear liquids Denies any nausea or vomiting thus far today.  Patient's husband participated in her conversation today via telephone, and has been updated.   Past Medical History:  Diagnosis Date  . Allergy   . Anemia   . Anxiety   . Bronchiectasis ([place]) 10/01/2015  . Cataract    removed both eyes  . Depression   . Diabetes mellitus without complication ([place])    diet controlled DM- no meds   . Diverticulosis of colon (without mention of hemorrhage) 07/17/2007  . Gastritis 07/17/2007  . GERD (gastroesophageal reflux disease)   . Glaucoma   . History of fatty infiltration of liver   . Hyperlipemia   . Hyperparathyroidism ([place])   . Hypertension    under control   . Lumbosacral root lesions, not elsewhere classified 04/13/2014  . Obese   . Osteoarthritis   . Osteoporosis   . PONV (postoperative nausea and vomiting)    Not in last several surgeries  . Renal calculus, right    severe  . Sleep apnea    uses cpap at night   . Thyroid disease     Past Surgical History:  Procedure Laterality Date  . APPENDECTOMY  1974  . BREAST BIOPSY  1988   LEFT--BENIGN   . CESAREAN SECTION  1974  . CESAREAN SECTION  1971  . CHOLECYSTECTOMY  2000  . COLONOSCOPY  2008  . ESOPHAGOGASTRODUODENOSCOPY  2008  . EYE SURGERY  Lasik, Cataract, Hx of Glaucoma  . KNEE ARTHROPLASTY  2004   RIGHT-TOTAL   . LUMBAR FUSION  2008  . PARATHYROIDECTOMY  2009   X 2--RIGHT INFERIOR AND LEFT SUPERIOR  . PARATHYROIDECTOMY N/A 01/04/2017   Procedure: RIGHT NECK EXPLORATION WITH PARATHYROIDECTOMY;  Surgeon: Armandina Gemma, MD;  Location: [facility];  Service: General;  Laterality: N/A;  . ROTATOR CUFF REPAIR  2000   RIGHT  . TUBAL LIGATION  1974  . UPPER GASTROINTESTINAL ENDOSCOPY      Prior to Admission medications    Medication Sig Start Date End Date Taking? Authorizing Provider  albuterol (VENTOLIN HFA) 108 (90 Base) MCG/ACT inhaler Inhale 2 puffs into the lungs every 6 (six) hours as needed for wheezing or shortness of breath. 01/30/19  Yes Mariel Aloe, MD  dorzolamide (TRUSOPT) 2 % ophthalmic solution Place 1 drop into both eyes 2 (two) times daily.  11/15/15  Yes [provider]  fluticasone (FLONASE) 50 MCG/ACT nasal spray Place 2 sprays into both nostrils daily. Patient taking differently: Place 2 sprays into both nostrils at bedtime as needed for allergies.  09/01/15  Yes Skeet Latch, MD  ibuprofen (ADVIL) 200 MG tablet Take 200-400 mg by mouth at bedtime as needed for moderate pain.    Yes [provider]  levothyroxine (SYNTHROID) 50 MCG tablet Take 50 mcg by mouth daily. 12/18/18  Yes [provider]  loperamide (IMODIUM) 2 MG capsule Take 2 mg by mouth as needed for diarrhea or loose stools.    Yes [provider]  losartan (COZAAR) 100 MG tablet TAKE 1 TABLET EACH DAY. Patient taking differently: Take 100 mg by mouth daily.  07/24/16  Yes Skeet Latch, MD  PROTONIX 40 MG tablet Take 40 mg by mouth daily.  03/18/19  Yes [provider]  ROCKLATAN 0.02-0.005 % SOLN Place 1 drop into both eyes at bedtime. 01/08/19  Yes [provider]  sertraline (ZOLOFT) 50 MG tablet Take 50 mg by mouth daily.  03/24/19  Yes [provider]  zoledronic acid (RECLAST) 5 MG/100ML SOLN injection Inject 5 mg into the vein yearly. (Start June 2015)   Yes [provider]  cefdinir (OMNICEF) 300 MG capsule Take 1 capsule (300 mg total) by mouth 2 (two) times daily. Patient not taking: Reported on 04/01/2019 02/25/19   Mauri Pole, MD  metroNIDAZOLE (FLAGYL) 500 MG tablet Take 1 tablet (500 mg total) by mouth 3 (three) times daily. Patient not taking: Reported on 05/01/2019 02/25/19   Mauri Pole, MD  saccharomyces boulardii  (FLORASTOR) 250 MG capsule Take 1 capsule (250 mg total) by mouth 2 (two) times daily. 1 twice daily for 21 days Patient not taking: Reported on 04/28/2019 02/13/19   Mauri Pole, MD  triamterene-hydrochlorothiazide (MAXZIDE-25) 37.5-25 MG tablet Take 1 tablet by mouth as directed. One whole tablet daily alternating with 1/2 tablet daily. 11/13/15   [provider]    Current Facility-Administered Medications  Medication Dose Route Frequency Provider Last Rate Last Dose  . chlorhexidine (PERIDEX) 0.12 % solution 15 mL  15 mL Mouth Rinse BID Gala Romney L, MD   15 mL at 05/01/19 2313  . dextrose 5 %-0.9 % sodium chloride infusion   Intravenous Continuous Elwyn Reach, MD 125 mL/hr at 05/02/19 8242    . heparin injection 5,000 Units  5,000 Units Subcutaneous Q8H Elwyn Reach, MD      . MEDLINE mouth rinse  15 mL Mouth Rinse q12n4p Gala Romney  L, MD      . ondansetron (ZOFRAN) tablet 4 mg  4 mg Oral Q6H PRN Elwyn Reach, MD       Or  . ondansetron (ZOFRAN) injection 4 mg  4 mg Intravenous Q6H PRN Gala Romney L, MD      . sodium chloride flush (NS) 0.9 % injection 3 mL  3 mL Intravenous Once Deno Etienne, DO        Allergies as of 05/01/2019 - Review Complete 05/01/2019  Allergen Reaction Noted  . Codeine Nausea And Vomiting 03/17/2014  . Contrast media [iodinated diagnostic agents] Hives, Shortness Of Breath, and Itching 05/16/2018  . Iohexol Hives and Other (See Comments) 02/21/2007  . Aspirin  06/30/2016  . Fentanyl  10/20/2016    Family History  Problem Relation Age of Onset  . Colon polyps Mother   . Hypertension Mother   . Osteoarthritis Mother   . Diabetes Father   . Stroke Father   . Hernia Father        Esophageal hernia  . Osteoarthritis Maternal Grandmother   . Stroke Maternal Grandfather   . Liver disease Paternal Grandmother   . Stroke Paternal Grandfather   . Colon cancer Neg Hx   . Esophageal cancer Neg Hx   . Rectal cancer  Neg Hx   . Stomach cancer Neg Hx     Social History   Socioeconomic History  . Marital status: Married    Spouse name: Not on file  . Number of children: 2  . Years of education: Not on file  . Highest education level: Not on file  Occupational History  . Occupation: retired  Scientific laboratory technician  . Financial resource strain: Not on file  . Food insecurity    Worry: Not on file    Inability: Not on file  . Transportation needs    Medical: Not on file    Non-medical: Not on file  Tobacco Use  . Smoking status: Former Smoker    Packs/day: 0.25    Years: 15.00    Pack years: 3.75    Types: Cigarettes    Quit date: 09/01/2014    Years since quitting: 4.6  . Smokeless tobacco: Never Used  Substance and Sexual Activity  . Alcohol use: Yes    Alcohol/week: 1.0 standard drinks    Types: 1 Glasses of wine per week    Comment: occasional  . Drug use: No  . Sexual activity: Not on file  Lifestyle  . Physical activity    Days per week: Not on file    Minutes per session: Not on file  . Stress: Not on file  Relationships  . Social Herbalist on phone: Not on file    Gets together: Not on file    Attends religious service: Not on file    Active member of club or organization: Not on file    Attends meetings of clubs or organizations: Not on file    Relationship status: Not on file  . Intimate partner violence    Fear of current or ex partner: Not on file    Emotionally abused: Not on file    Physically abused: Not on file    Forced sexual activity: Not on file  Other Topics Concern  . Not on file  Social History Narrative   Married, with 2 children   Right handed   Some college   No caffeine      Epworth Sleepiness Scale =  7 (as of 07/21/2015)       Review of Systems: Pertinent positive and negative review of systems were noted in the above HPI section.  All other review of systems was otherwise negative.  Physical Exam: Vital signs in last 24 hours: Temp:   [98.1 F (36.7 C)-98.5 F (36.9 C)] 98.2 F (36.8 C) (07/31 0456) Pulse Rate:  [65-76] 69 (07/31 0456) Resp:  [14-18] 16 (07/31 0456) BP: (90-128)/(50-90) 107/55 (07/31 0456) SpO2:  [90 %-100 %] 90 % (07/31 0456) Weight:  [94.6 kg] 94.6 kg (07/30 2238) Last BM Date: 05/01/19 General:   Alert,  Well-developed, elderly white female pleasant and cooperative in NAD Head:  Normocephalic and atraumatic. Eyes:  Sclera clear, no icterus.   Conjunctiva pink. Ears:  Normal auditory acuity. Nose:  No deformity, discharge,  or lesions. Mouth:  No deformity or lesions.   Neck:  Supple; no masses or thyromegaly. Lungs:  Clear throughout to auscultation.   No wheezes, crackles, or rhonchi. Heart:  Regular rate and rhythm; no murmurs, clicks, rubs,  or gallops. Abdomen:  Soft obese,,nontender, BS active,nonpalp mass or hsm.  There is some fullness in the epigastrium, no succussion splash heard Rectal:  Deferred  Msk:  Symmetrical without gross deformities. . Pulses:  Normal pulses noted. Extremities:  Without clubbing or edema. Neurologic:  Alert and  oriented x4;  grossly normal neurologically. Skin:  Intact without significant lesions or rashes.. Psych:  Alert and cooperative. Normal mood and affect.  Intake/Output from previous day: 07/30 0701 - 07/31 0700 In: 11048.6 [P.O.:120; I.V.:903.6; IV Piggyback:10025] Out: 200 [Urine:200] Intake/Output this shift: Total I/O In: 352.2 [P.O.:120; I.V.:232.2] Out: 200 [Urine:200]  Lab Results: Recent Labs    05/01/19 1524 05/02/19 0303  WBC 9.8 8.2  HGB 14.2 12.4  HCT 45.6 40.1  PLT 361 273   BMET Recent Labs    05/01/19 1524 05/02/19 0303  NA 137 137  K 3.7 3.8  CL 97* 101  CO2 28 26  GLUCOSE 126* 137*  BUN 33* 30*  CREATININE 2.04* 1.89*  CALCIUM 10.8* 9.7   LFT Recent Labs    05/02/19 0303  PROT 4.9*  ALBUMIN 2.4*  AST 24  ALT 20  ALKPHOS 84  BILITOT 0.4   PT/INR No results for input(s): LABPROT, INR in the last 72  hours. Hepatitis Panel No results for input(s): HEPBSAG, HCVAB, HEPAIGM, HEPBIGM in the last 72 hours.    IMPRESSION:  #8 84 year old white female with 27-month history of anorexia, nausea, vomiting, and 20 pound weight loss.  She is also been having intermittent episodes of diarrhea  EGD as an outpatient concerning for gastric outlet obstruction large amount of retained food and fluid and inability to traverse beyond D2 She also has a diffuse severe gastritis Rule out NSAID induced peptic ulcer disease, possible duodenal ulcer, edema or stricture causing outlet obstruction.  Rule out malignancy  #2 history of diverticulitis Noncontrasted CT scan yesterday noted a little bit of stranding around the sigmoid colon.  Clinically she does not have diverticulitis, there is no tenderness in the lower abdomen, and no fever or leukocytosis  #3 bronchiectasis 4.  Adult onset diabetes mellitus 5.  Hypertension 6.  Hyperparathyroidism 7.  Status post appendectomy cholecystectomy and C-section  8.  Acute kidney injury, on mild chronic kidney disease   Plan; Continue IV fluid hydration Hold Maxide IV PPI twice daily Sips of water only today and n.p.o. after midnight Patient will be scheduled for repeat EGD with Dr.  Mansouraty tomorrow morning. I do not think she needs to be on antibiotics for diverticulitis Stool studies are pending for her intermittent diarrhea, she does have history of IBS.  Thank you- will follow with you     Amy EsterwoodPA-C  05/02/2019, 10:32 AM

## 2019-05-02 NOTE — Progress Notes (Signed)
[facility] Progress Note  Patient: [name] GQQ:[id number]   PCP: Burnard Bunting, MD DOB: 06-28-1935   DOA: 05/01/2019   DOS: 05/02/2019   Date of Service: the patient was seen and examined on 05/02/2019  Brief hospital course: Pt. with PMH of diabetes, GERD, hypertension, morbid obesity, hyperlipidemia, diverticulosis, hyperparathyroidism, anxiety disorder and bronchiectasis; admitted on 05/01/2019, presented with complaint of nausea and vomiting, was found to have gastric outlet obstruction. Currently further plan is follow-up on GI recommendation.  Subjective: No further nausea for now no vomiting today.  Passing gas.  No bowel movement.  No abdominal pain reported.  Assessment and Plan: 1 gastric outlet obstruction. Persistent nausea with vomiting:  Recent EGD outpatient was concerning for gastric outlet obstruction with large amount of retained food. Presents with weight loss, anorexia, nausea and vomiting. Also has intermittent diarrhea but currently resolved. GI consulted. Plan for a repeat endoscopy on 05/03/2019. Currently on clear liquid diet.  2 hypertension: We will continue his home regimen.  Hold diuretic  3 GERD: Continue with PPIs  4 primary hyperparathyroidism: Stable.  No change in treatment.  5 hypothyroidism: Continue replacement therapy.  6 obstructive sleep apnea Morbid obesity. CPAP at night.  7.  Acute kidney injury. Chronic kidney disease stage II. Renal function mildly worsening secondary to dehydration. Continue IV fluids. Hold diuretic  8.  Intermittent diarrhea at home. History of cholecystectomy. Reported diarrhea at home and therefore GI pathogen panel has been ordered. Currently no further bowel movement. Monitor.  Diet: Clear liquid diet DVT Prophylaxis: Subcutaneous Heparin   Advance goals of care discussion: Full code  Family Communication: no family was present at bedside, at the time of interview.   Disposition:   Discharge to Home .  Consultants: gastroenterology  Procedures: none  Scheduled Meds: . chlorhexidine  15 mL Mouth Rinse BID  . heparin  5,000 Units Subcutaneous Q8H  . mouth rinse  15 mL Mouth Rinse q12n4p  . pantoprazole (PROTONIX) IV  40 mg Intravenous Q12H  . sodium chloride flush  10-40 mL Intracatheter Q12H  . sodium chloride flush  3 mL Intravenous Once   Continuous Infusions: . dextrose 5 % and 0.9% NaCl 125 mL/hr at 05/02/19 1307   PRN Meds: ondansetron **OR** ondansetron (ZOFRAN) IV, sodium chloride flush Antibiotics: Anti-infectives (From admission, onward)   None       Objective: Physical Exam: Vitals:   05/01/19 2221 05/01/19 2238 05/02/19 0456 05/02/19 1530  BP: 124/90  (!) 107/55 120/72  Pulse: 72  69 80  Resp: 16  16 17   Temp: 98.5 F (36.9 C)  98.2 F (36.8 C) 98.1 F (36.7 C)  TempSrc:    Oral  SpO2: 97%  90% 96%  Weight:  94.6 kg    Height:  5\' 3"  (1.6 m)      Intake/Output Summary (Last 24 hours) at 05/02/2019 1825 Last data filed at 05/02/2019 1702 Gross per 24 hour  Intake 12134.94 ml  Output 400 ml  Net 11734.94 ml   Filed Weights   05/01/19 2238  Weight: 94.6 kg   General: alert and oriented to time, place, and person. Appear in mild distress, affect appropriate Eyes: PERRL, Conjunctiva normal ENT: Oral Mucosa Clear, moist  Neck: no JVD, no Abnormal Mass Or lumps Cardiovascular: S1 and S2 Present, no Murmur, peripheral pulses symmetrical Respiratory: normal respiratory effort, Bilateral Air entry equal and Decreased, no use of accessory muscle, Clear to Auscultation, no Crackles, no wheezes Abdomen: Bowel Sound present, Soft and  mild tenderness, no hernia Extremities: no Pedal edema, no calf tenderness Neurologic: normal without focal findings, mental status, speech normal, alert and oriented x3, PERLA, Motor strength 5/5 and symmetric and sensation grossly normal to light touch Gait not checked due to patient safety concerns  Data  Reviewed: CBC: Recent Labs  Lab 05/01/19 1524 05/02/19 0303  WBC 9.8 8.2  HGB 14.2 12.4  HCT 45.6 40.1  MCV 94.4 96.4  PLT 361 937   Basic Metabolic Panel: Recent Labs  Lab 05/01/19 1524 05/01/19 1527 05/02/19 0303  NA 137  --  137  K 3.7  --  3.8  CL 97*  --  101  CO2 28  --  26  GLUCOSE 126*  --  137*  BUN 33*  --  30*  CREATININE 2.04*  --  1.89*  CALCIUM 10.8*  --  9.7  MG  --  2.1  --     Liver Function Tests: Recent Labs  Lab 05/01/19 1524 05/02/19 0303  AST 32 24  ALT 24 20  ALKPHOS 105 84  BILITOT 0.9 0.4  PROT 6.4* 4.9*  ALBUMIN 3.3* 2.4*   Recent Labs  Lab 05/01/19 1524  LIPASE 30   No results for input(s): AMMONIA in the last 168 hours. Coagulation Profile: No results for input(s): INR, PROTIME in the last 168 hours. Cardiac Enzymes: No results for input(s): CKTOTAL, CKMB, CKMBINDEX, TROPONINI in the last 168 hours. BNP (last 3 results) No results for input(s): PROBNP in the last 8760 hours. CBG: No results for input(s): GLUCAP in the last 168 hours. Studies: No results found.   Time spent: 35 minutes  Author: Berle Mull, MD [facility] 05/02/2019 6:25 PM  To reach On-call, see care teams to locate the attending and reach out to them via [email or web address]. If 7PM-7AM, please contact night-coverage If you still have difficulty reaching the attending provider, please page the [facility] (Director on Call) for [facility] on amion for assistance.

## 2019-05-03 ENCOUNTER — Encounter (HOSPITAL_COMMUNITY): Payer: Self-pay | Admitting: Gastroenterology

## 2019-05-03 ENCOUNTER — Encounter (HOSPITAL_COMMUNITY)
Admission: EM | Disposition: A | Discharge status: Home Health | Payer: Self-pay | Source: Ambulatory Visit | Attending: Student

## 2019-05-03 ENCOUNTER — Inpatient Hospital Stay (HOSPITAL_COMMUNITY): Payer: Medicare Other | Admitting: Certified Registered Nurse Anesthetist

## 2019-05-03 DIAGNOSIS — K209 Esophagitis, unspecified: Secondary | ICD-10-CM

## 2019-05-03 DIAGNOSIS — K317 Polyp of stomach and duodenum: Secondary | ICD-10-CM

## 2019-05-03 DIAGNOSIS — K279 Peptic ulcer, site unspecified, unspecified as acute or chronic, without hemorrhage or perforation: Secondary | ICD-10-CM

## 2019-05-03 HISTORY — PX: BIOPSY: SHX5522

## 2019-05-03 HISTORY — PX: ESOPHAGOGASTRODUODENOSCOPY (EGD) WITH PROPOFOL: SHX5813

## 2019-05-03 LAB — CBC WITH DIFFERENTIAL/PLATELET
Abs Immature Granulocytes: 0.04 10*3/uL (ref 0.00–0.07)
Basophils Absolute: 0 10*3/uL (ref 0.0–0.1)
Basophils Relative: 1 %
Eosinophils Absolute: 0.4 10*3/uL (ref 0.0–0.5)
Eosinophils Relative: 4 %
HCT: 37.8 % (ref 36.0–46.0)
Hemoglobin: 11.5 g/dL — ABNORMAL LOW (ref 12.0–15.0)
Immature Granulocytes: 1 %
Lymphocytes Relative: 18 %
Lymphs Abs: 1.6 10*3/uL (ref 0.7–4.0)
MCH: 29.6 pg (ref 26.0–34.0)
MCHC: 30.4 g/dL (ref 30.0–36.0)
MCV: 97.4 fL (ref 80.0–100.0)
Monocytes Absolute: 1 10*3/uL (ref 0.1–1.0)
Monocytes Relative: 12 %
Neutro Abs: 5.7 10*3/uL (ref 1.7–7.7)
Neutrophils Relative %: 64 %
Platelets: 236 10*3/uL (ref 150–400)
RBC: 3.88 MIL/uL (ref 3.87–5.11)
RDW: 14.9 % (ref 11.5–15.5)
WBC: 8.7 10*3/uL (ref 4.0–10.5)
nRBC: 0 % (ref 0.0–0.2)

## 2019-05-03 LAB — COMPREHENSIVE METABOLIC PANEL
ALT: 16 U/L (ref 0–44)
AST: 18 U/L (ref 15–41)
Albumin: 2.2 g/dL — ABNORMAL LOW (ref 3.5–5.0)
Alkaline Phosphatase: 78 U/L (ref 38–126)
Anion gap: 5 (ref 5–15)
BUN: 23 mg/dL (ref 8–23)
CO2: 26 mmol/L (ref 22–32)
Calcium: 8.5 mg/dL — ABNORMAL LOW (ref 8.9–10.3)
Chloride: 107 mmol/L (ref 98–111)
Creatinine, Ser: 1.49 mg/dL — ABNORMAL HIGH (ref 0.44–1.00)
GFR calc Af Amer: 38 mL/min — ABNORMAL LOW (ref >60)
GFR calc non Af Amer: 33 mL/min — ABNORMAL LOW (ref >60)
Glucose, Bld: 137 mg/dL — ABNORMAL HIGH (ref 70–99)
Potassium: 3.9 mmol/L (ref 3.5–5.1)
Sodium: 138 mmol/L (ref 135–145)
Total Bilirubin: 0.3 mg/dL (ref 0.3–1.2)
Total Protein: 4.6 g/dL — ABNORMAL LOW (ref 6.5–8.1)

## 2019-05-03 LAB — MAGNESIUM: Magnesium: 1.8 mg/dL (ref 1.7–2.4)

## 2019-05-03 SURGERY — ESOPHAGOGASTRODUODENOSCOPY (EGD) WITH PROPOFOL
Anesthesia: Monitor Anesthesia Care

## 2019-05-03 MED ORDER — SODIUM CHLORIDE 0.9 % IV SOLN
INTRAVENOUS | Status: DC | PRN
  Administered 2019-05-03: 08:00:00 via INTRAVENOUS

## 2019-05-03 MED ORDER — PROPOFOL 500 MG/50ML IV EMUL
INTRAVENOUS | Status: DC | PRN
  Administered 2019-05-03: 100 ug/kg/min via INTRAVENOUS

## 2019-05-03 MED ORDER — LIDOCAINE 2% (20 MG/ML) 5 ML SYRINGE
INTRAMUSCULAR | Status: DC | PRN
  Administered 2019-05-03: 80 mg via INTRAVENOUS

## 2019-05-03 MED ORDER — LEVOTHYROXINE SODIUM 100 MCG/5ML IV SOLN
25.0000 ug | Freq: Every day | INTRAVENOUS | Status: DC
  Administered 2019-05-03 – 2019-05-05 (×3): 25 ug via INTRAVENOUS
  Filled 2019-05-03 (×5): qty 5

## 2019-05-03 MED ORDER — PHENOL 1.4 % MT LIQD
1.0000 | OROMUCOSAL | Status: DC | PRN
  Filled 2019-05-03: qty 177

## 2019-05-03 MED ORDER — PROPOFOL 10 MG/ML IV BOLUS
INTRAVENOUS | Status: DC | PRN
  Administered 2019-05-03: 20 mg via INTRAVENOUS
  Administered 2019-05-03: 10 mg via INTRAVENOUS
  Administered 2019-05-03 (×2): 20 mg via INTRAVENOUS

## 2019-05-03 MED ORDER — ONDANSETRON HCL 4 MG/2ML IJ SOLN: 4.0000 mg | Freq: Once | INTRAMUSCULAR | Status: DC | PRN

## 2019-05-03 MED ORDER — PROPOFOL 10 MG/ML IV BOLUS
INTRAVENOUS | Status: AC
  Filled 2019-05-03: qty 60

## 2019-05-03 MED ORDER — SODIUM CHLORIDE 0.9% FLUSH
10.0000 mL | INTRAVENOUS | Status: DC | PRN
  Administered 2019-05-12 – 2019-05-13 (×2): 10 mL
  Filled 2019-05-03 (×2): qty 40

## 2019-05-03 MED ORDER — PROPOFOL 10 MG/ML IV BOLUS
INTRAVENOUS | Status: AC
  Filled 2019-05-03: qty 20

## 2019-05-03 MED ORDER — HEPARIN SODIUM (PORCINE) 5000 UNIT/ML IJ SOLN
5000.0000 [IU] | Freq: Three times a day (TID) | INTRAMUSCULAR | Status: DC
  Administered 2019-05-03 – 2019-05-06 (×8): 5000 [IU] via SUBCUTANEOUS
  Filled 2019-05-03 (×8): qty 1

## 2019-05-03 SURGICAL SUPPLY — 15 items

## 2019-05-03 NOTE — Transfer of Care (Signed)
Immediate Anesthesia Transfer of Care Note  Patient: [name]  Procedure(s) Performed: ESOPHAGOGASTRODUODENOSCOPY (EGD) WITH PROPOFOL (N/A ) ENTEROSCOPY (N/A ) BIOPSY  Patient Location: Endoscopy Unit  Anesthesia Type:MAC  Level of Consciousness: drowsy and patient cooperative  Airway & Oxygen Therapy: Patient Spontanous Breathing and Patient connected to nasal cannula oxygen  Post-op Assessment: Report given to RN and Post -op Vital signs reviewed and stable  Post vital signs: Reviewed and stable  Last Vitals:  Vitals Value Taken Time  BP    Temp    Pulse    Resp    SpO2      Last Pain:  Vitals:   05/03/19 0726  TempSrc: Oral  PainSc: 0-No pain         Complications: No apparent anesthesia complications

## 2019-05-03 NOTE — Anesthesia Preprocedure Evaluation (Signed)
Anesthesia Evaluation  Patient identified by MRN, date of birth, ID band Patient awake    Reviewed: Allergy & Precautions, NPO status , Patient's Chart, lab work & pertinent test results  History of Anesthesia Complications (+) PONV and history of anesthetic complications  Airway Mallampati: I  TM Distance: <3 FB Neck ROM: Full    Dental  (+) Dental Advisory Given, Caps   Pulmonary asthma , sleep apnea and Continuous Positive Airway Pressure Ventilation , Recent URI , Resolved, former smoker,    breath sounds clear to auscultation       Cardiovascular hypertension, Pt. on medications (-) angina Rhythm:Regular Rate:Normal  ' 16 ECHO: EF 60-65%, valves OK   Neuro/Psych PSYCHIATRIC DISORDERS Anxiety Depression Recent vertigo Chronic back pain    GI/Hepatic Neg liver ROS, GERD  Medicated, Controlled and Poorly Controlled,  Endo/Other  diabetes, Well Controlled, Type 2Hypothyroidism Obesity   Renal/GU Renal InsufficiencyRenal diseasestones     Musculoskeletal  (+) Arthritis ,   Abdominal   Peds  Hematology  (+) Blood dyscrasia (Hb 11.5), anemia ,   Anesthesia Other Findings   Reproductive/Obstetrics                             Anesthesia Physical  Anesthesia Plan  ASA: III  Anesthesia Plan: MAC   Post-op Pain Management:    Induction: Intravenous  PONV Risk Score and Plan: 3 and Propofol infusion and Treatment may vary due to age or medical condition  Airway Management Planned: Natural Airway and Nasal Cannula  Additional Equipment:   Intra-op Plan:   Post-operative Plan:   Informed Consent: I have reviewed the patients History and Physical, chart, labs and discussed the procedure including the risks, benefits and alternatives for the proposed anesthesia with the patient or authorized representative who has indicated his/her understanding and acceptance.     Dental advisory  given  Plan Discussed with: CRNA and Surgeon  Anesthesia Plan Comments:         Anesthesia Quick Evaluation

## 2019-05-03 NOTE — Op Note (Signed)
[facility] Patient Name: [name] Procedure Date: 05/03/2019 MRN: [id number] Attending MD: Gabriel Mansouraty , MD Date of Birth: 01/16/1935 CSN: [id number] Age: 84 Admit Type: Inpatient Procedure:                Upper GI endoscopy Indications:              Diagnostic procedure, Epigastric abdominal pain,                            Suspected stenosis of the duodenum, Abnormal CT of                            the GI tract Providers:                Gabriel Mansouraty, MD, Jamie Bailey, RN, Darlene                            Davis, Technician, Josh Jarvela, CRNA Referring MD:             Dr. Gonfa, Dr. Nandigam Medicines:                Monitored Anesthesia Care Complications:            No immediate complications. Estimated Blood Loss:     Estimated blood loss was minimal. Procedure:                After obtaining informed consent, the endoscope was                            passed under direct vision. Throughout the                            procedure, the patient's blood pressure, pulse, and                            oxygen saturations were monitored continuously. The                            [id number]-[id number] ([id number]) Olympus pediatric colonscope                            was introduced through the mouth, and advanced to                            the duodenal bulb. The [id number]-[id number] ([id number]) Olympus                            ultra slim endoscope was introduced through the                            mouth, and advanced to the duodenal bulb. The upper                            GI endoscopy was accomplished without difficulty.                              The patient tolerated the procedure. Scope In: Scope Out: Findings:      No gross lesions were noted in the proximal esophagus and in the mid       esophagus.      LA Grade B (one or more mucosal breaks greater than 5 mm, not extending       between the tops of two mucosal folds) esophagitis was found in the     distal esophagus and GE Junction.      A small hiatal hernia was present.      Retained fluid was found in the entire examined stomach. Suction via       endoscope removed 1300 cc of fluid.      Multiple dispersed, small non-bleeding erosions were found in the       gastric body and in the gastric antrum.      Two superficial gastric ulcers with a clean ulcer base (Forrest Class       III) were found at the pylorus. The largest lesion was 6 mm in largest       dimension.      Multiple small semi-sessile polyps were found in the gastric fundus and       in the gastric body. Biopsies were taken with a cold forceps for       histology to rule out adenoma but most likely fundic gland.      No gross lesions were noted in the entire examined stomach. Biopsies       were taken with a cold forceps for histology and Helicobacter pylori       testing.      An acquired malignant-appearing, intrinsic severe stenosis was found in       the D1/D2 sweep of the duodenum and was non-traversed.      Localized severe mucosal changes characterized by congestion, erythema,       friability (with contact bleeding), granularity, inflammation and       altered texture were found in the D1/D2 sweep of the duodenum. Biopsies       were taken with a cold forceps for histology.      A 16 Fr nasogastric tube was placed through the nares into the       esophagus. Under endoscopic guidance, the tube was advanced into the       stomach. Placement was confirmed by scope visualization. Impression:               - No gross lesions in esophagus.                           - LA Grade B esophagitis distally.                           - Small hiatal hernia.                           - Retained gastric fluid. Suctioned via endoscope.                           - Non-bleeding erosive gastropathy.                           - Gastric ulcers with a clean ulcer base (Forrest  Class III).                            - Multiple gastric polyps. Biopsied.                           - Biopsied stomach for HP evaluation.                           - Acquired duodenal stenosis. Mucosal changes in                            the duodenum. Biopsied.                           - Feeding tube placement was successfully performed. Moderate Sedation:      Not Applicable - Patient had care per Anesthesia. Recommendation:           - The patient will be observed post-procedure,                            until all discharge criteria are met.                           - Return patient to hospital ward for ongoing care.                           - NPO.                           - Observe patient's clinical course.                           - Await pathology results.                           - May be worthwhile this weekend to have surgery                            evaluate patient for possible role of                            gastrojejunostomy for bypass vs other needs based                            on findings, but this does not look like an                            endoscopically amenable lesion to dilation, unless                            she has no other options and then consideration of                            a possible permanent enteral stent could be had.  High concern for underlying malignancy with this.                           - PPI BID.                           - The findings and recommendations were discussed                            with the patient.                           - The findings and recommendations were discussed                            with the patient's family.                           - The findings and recommendations were discussed                            with the Hospital Team. Procedure Code(s):        --- Professional ---                           43241, Esophagogastroduodenoscopy, flexible,                             transoral; with insertion of intraluminal tube or                            catheter                           43239, Esophagogastroduodenoscopy, flexible,                            transoral; with biopsy, single or multiple Diagnosis Code(s):        --- Professional ---                           K20.9, Esophagitis, unspecified                           K44.9, Diaphragmatic hernia without obstruction or                            gangrene                           K31.89, Other diseases of stomach and duodenum                           K25.9, Gastric ulcer, unspecified as acute or                            chronic, without hemorrhage or perforation                             K31.7, Polyp of stomach and duodenum                           K31.5, Obstruction of duodenum                           R10.13, Epigastric pain                           R93.3, Abnormal findings on diagnostic imaging of                            other parts of digestive tract CPT copyright 2019 American Medical Association. All rights reserved. The codes documented in this report are preliminary and upon coder review may  be revised to meet current compliance requirements. Gabriel Mansouraty, MD 05/03/2019 9:15:53 AM Number of Addenda: 0 

## 2019-05-03 NOTE — Interval H&P Note (Signed)
History and Physical Interval Note:  05/03/2019 6:54 AM  [name]  has presented today for surgery, with the diagnosis of GOO.  The various methods of treatment have been discussed with the patient and family. After consideration of risks, benefits and other options for treatment, the patient has consented to  Procedure(s): ESOPHAGOGASTRODUODENOSCOPY (EGD) WITH PROPOFOL (N/A) ENTEROSCOPY (N/A) as a surgical intervention.  The patient's history has been reviewed, patient examined, no change in status, stable for surgery.  I have reviewed the patient's chart and labs.  Questions were answered to the patient's satisfaction.     [employer]

## 2019-05-03 NOTE — Anesthesia Postprocedure Evaluation (Signed)
Anesthesia Post Note  Patient: [name]  Procedure(s) Performed: ESOPHAGOGASTRODUODENOSCOPY (EGD) WITH PROPOFOL (N/A ) ENTEROSCOPY (N/A ) BIOPSY     Patient location during evaluation: PACU Anesthesia Type: MAC Level of consciousness: awake and alert Pain management: pain level controlled Vital Signs Assessment: post-procedure vital signs reviewed and stable Respiratory status: spontaneous breathing, nonlabored ventilation and respiratory function stable Cardiovascular status: stable and blood pressure returned to baseline Postop Assessment: no apparent nausea or vomiting Anesthetic complications: no    Last Vitals:  Vitals:   05/03/19 0905 05/03/19 0910  BP: 112/67 (!) 117/59  Pulse: 64 67  Resp: (!) 22 (!) 8  Temp:    SpO2: 93% 97%    Last Pain:  Vitals:   05/03/19 0910  TempSrc:   PainSc: 0-No pain                 Catalina Gravel

## 2019-05-03 NOTE — Progress Notes (Signed)
PROGRESS NOTE  [name] TFT:[id number] DOB: 04/02/1935   PCP: Burnard Bunting, MD  Patient is from: Home  DOA: 05/01/2019 LOS: 2  Brief Narrative / Interim history: 84 year old female with history of DM-2, GERD, HTN, morbid obesity, hyperlipidemia, diverticulosis, hyperparathyroidism, anxiety disorder and bronchiectasis; admitted on 05/01/2019, presented with complaint of nausea and vomiting, was found to have gastric outlet obstruction concerning for malignancy. Had EGD by gastroenterology on 05/03/2019 revealed a esophagitis, nonbleeding erosive gastropathy, gastric ulcer with a clean ulcer base, multiple gastric polyps, acquired duodenal stenosis and mucosal change in the duodenum.  Biopsies obtained.  Subjective: No major events overnight or this morning.  Had EGD with the above finding.  Denies nausea, vomiting, abdominal pain.  Uncomfortable from NG tube.  Denies chest pain or dyspnea.  Objective: Vitals:   05/03/19 0910 05/03/19 0920 05/03/19 0950 05/03/19 1340  BP: (!) 117/59 117/77 139/63 (!) 133/55  Pulse: 67 69 65 71  Resp: (!) '8 15  16  '$ Temp:   (!) 97.5 F (36.4 C) 98 F (36.7 C)  TempSrc:   Oral   SpO2: 97% 100% 100% 99%  Weight:      Height:        Intake/Output Summary (Last 24 hours) at 05/03/2019 1400 Last data filed at 05/03/2019 1339 Gross per 24 hour  Intake 3118.87 ml  Output 1500 ml  Net 1618.87 ml   Filed Weights   05/01/19 2238  Weight: 94.6 kg    Examination:  GENERAL: No acute distress.  Appears well.  HEENT: MMM.  Vision and hearing grossly intact.  NG tube in place. NECK: Supple.  No apparent JVD.  RESP:  No IWOB. Good air movement bilaterally. CVS:  RRR. Heart sounds normal.  ABD/GI/GU: Bowel sounds present. Soft. Non tender.  MSK/EXT:  Moves extremities. No apparent deformity or edema.  SKIN: no apparent skin lesion or wound NEURO: Awake, alert and oriented appropriately.  No gross deficit.  PSYCH: Calm. Normal affect.    I have  personally reviewed the following labs and images:  Radiology Studies: No results found.  Microbiology: Recent Results (from the past 240 hour(s))  SARS Coronavirus 2 (CEPHEID - Performed in [place] hospital lab), Hosp Order     Status: None   Collection Time: 05/01/19  3:46 PM   Specimen: Nasopharyngeal Swab  Result Value Ref Range Status   SARS Coronavirus 2 NEGATIVE NEGATIVE Final    Comment: (NOTE) If result is NEGATIVE SARS-CoV-2 target nucleic acids are NOT DETECTED. The SARS-CoV-2 RNA is generally detectable in upper and lower  respiratory specimens during the acute phase of infection. The lowest  concentration of SARS-CoV-2 viral copies this assay can detect is 250  copies / mL. A negative result does not preclude SARS-CoV-2 infection  and should not be used as the sole basis for treatment or other  patient management decisions.  A negative result may occur with  improper specimen collection / handling, submission of specimen other  than nasopharyngeal swab, presence of viral mutation(s) within the  areas targeted by this assay, and inadequate number of viral copies  (<250 copies / mL). A negative result must be combined with clinical  observations, patient history, and epidemiological information. If result is POSITIVE SARS-CoV-2 target nucleic acids are DETECTED. The SARS-CoV-2 RNA is generally detectable in upper and lower  respiratory specimens dur ing the acute phase of infection.  Positive  results are indicative of active infection with SARS-CoV-2.  Clinical  correlation with patient history  and other diagnostic information is  necessary to determine patient infection status.  Positive results do  not rule out bacterial infection or co-infection with other viruses. If result is PRESUMPTIVE POSTIVE SARS-CoV-2 nucleic acids MAY BE PRESENT.   A presumptive positive result was obtained on the submitted specimen  and confirmed on repeat testing.  While 2019 novel  coronavirus  (SARS-CoV-2) nucleic acids may be present in the submitted sample  additional confirmatory testing may be necessary for epidemiological  and / or clinical management purposes  to differentiate between  SARS-CoV-2 and other Sarbecovirus currently known to infect humans.  If clinically indicated additional testing with an alternate test  methodology [phone]) is advised. The SARS-CoV-2 RNA is generally  detectable in upper and lower respiratory sp ecimens during the acute  phase of infection. The expected result is Negative. Fact Sheet for Patients:  StrictlyIdeas.no Fact Sheet for Healthcare Providers: BankingDealers.co.za This test is not yet approved or cleared by the [place] FDA and has been authorized for detection and/or diagnosis of SARS-CoV-2 by FDA under an Emergency Use Authorization (EUA).  This EUA will remain in effect (meaning this test can be used) for the duration of the COVID-19 declaration under Section 564(b)(1) of the Act, 21 U.S.C. section 360bbb-3(b)(1), unless the authorization is terminated or revoked sooner. Performed at [facility], [place] [address] [place], [place] [address]     Sepsis Labs: Invalid input(s): PROCALCITONIN, LACTICIDVEN  Urine analysis:    Component Value Date/Time   COLORURINE YELLOW 05/02/2019 0832   APPEARANCEUR CLEAR 05/02/2019 0832   LABSPEC 1.017 05/02/2019 0832   PHURINE 5.0 05/02/2019 0832   GLUCOSEU NEGATIVE 05/02/2019 0832   HGBUR SMALL (A) 05/02/2019 0832   BILIRUBINUR NEGATIVE 05/02/2019 0832   KETONESUR NEGATIVE 05/02/2019 0832   PROTEINUR NEGATIVE 05/02/2019 0832   UROBILINOGEN 0.2 06/01/2010 0915   NITRITE NEGATIVE 05/02/2019 0832   LEUKOCYTESUR SMALL (A) 05/02/2019 0832    Anemia Panel: No results for input(s): VITAMINB12, FOLATE, FERRITIN, TIBC, IRON, RETICCTPCT in the last 72 hours.  Thyroid Function Tests: No results for  input(s): TSH, T4TOTAL, FREET4, T3FREE, THYROIDAB in the last 72 hours.  Lipid Profile: No results for input(s): CHOL, HDL, LDLCALC, TRIG, CHOLHDL, LDLDIRECT in the last 72 hours.  CBG: No results for input(s): GLUCAP in the last 168 hours.  HbA1C: No results for input(s): HGBA1C in the last 72 hours.  BNP (last 3 results): No results for input(s): PROBNP in the last 8760 hours.  Cardiac Enzymes: No results for input(s): CKTOTAL, CKMB, CKMBINDEX, TROPONINI in the last 168 hours.  Coagulation Profile: No results for input(s): INR, PROTIME in the last 168 hours.  Liver Function Tests: Recent Labs  Lab 05/01/19 1524 05/02/19 0303 05/03/19 0638  AST 32 24 18  ALT '24 20 16  '$ ALKPHOS 105 84 78  BILITOT 0.9 0.4 0.3  PROT 6.4* 4.9* 4.6*  ALBUMIN 3.3* 2.4* 2.2*   Recent Labs  Lab 05/01/19 1524  LIPASE 30   No results for input(s): AMMONIA in the last 168 hours.  Basic Metabolic Panel: Recent Labs  Lab 05/01/19 1524 05/01/19 1527 05/02/19 0303 05/03/19 0638  NA 137  --  137 138  K 3.7  --  3.8 3.9  CL 97*  --  101 107  CO2 28  --  26 26  GLUCOSE 126*  --  137* 137*  BUN 33*  --  30* 23  CREATININE 2.04*  --  1.89* 1.49*  CALCIUM 10.8*  --  9.7 8.5*  MG  --  2.1  --  1.8   GFR: Estimated Creatinine Clearance: 32.4 mL/min (A) (by C-G formula based on SCr of 1.49 mg/dL (H)).  CBC: Recent Labs  Lab 05/01/19 1524 05/02/19 0303 05/03/19 0638  WBC 9.8 8.2 8.7  NEUTROABS  --   --  5.7  HGB 14.2 12.4 11.5*  HCT 45.6 40.1 37.8  MCV 94.4 96.4 97.4  PLT 361 273 236    Procedures:  EGD on 05/03/2019 No gross lesions in esophagus. - LA Grade B esophagitis distally. - Small hiatal hernia. - Retained gastric fluid. Suctioned via endoscope. - Non-bleeding erosive gastropathy. - Gastric ulcers with a clean ulcer base (Forrest Class III). - Multiple gastric polyps. Biopsied. - Biopsied stomach for HP evaluation. - Acquired duodenal stenosis. Mucosal changes in the  duodenum. Biopsied. - Feeding tube placement was successfully performed. Impression: Not Applicable - Patient had care per Anesthesia. Moderate Sedation: - The patient will be observed post-procedure, until all discharge criteria are met. - Return patient to hospital ward for ongoing care. - NPO. - Observe patient's clinical course. - Await pathology results. - May be worthwhile this weekend to have surgery evaluate patient for possible role of gastrojejunostomy for bypass vs other needs based on findings, but this does not look like an endoscopically amenable lesion to dilation, unless she has no other options and then consideration of a possible permanent enteral stent could be had. High concern for underlying malignancy with this.  Microbiology summarized: [id number] negative. GI panel pending C. difficile pending  Assessment & Plan: Acquired duodenal stenosis/gastric outlet obstruction -Reportedly not amenable to endoscopy -Follow pathology obtained during EGD. -We will consult general surgery in the morning per GI recommendation -Continue n.p.o., IVF, antiemetics and NG tube per GI. -PPI  AKI: Likely due to GI loss from nausea and vomiting.  Resolved. -Monitor renal function Hypertension: Normotensive -Continue diuretics  Hypertension: Normotensive -Hold home losartan and diuretics.  GERD: -Continue PPI  Hypothyroidism: -We will start IV Synthroid if remains n.p.o.  Primary hyperparathyroidism: Stable -On Reclast outpatient.  Obstructive sleep apnea -Nightly CPAP  Class II obesity: BMI 36.94 -Encourage lifestyle change to lose weight  DVT prophylaxis: Subcu heparin Code Status: Full code Family Communication: Patient and/or RN. Available if any question.  Disposition Plan: Remains inpatient for further evaluation of gastric outlet obstruction. Consultants: GI   Antimicrobials: Anti-infectives (From admission, onward)   None      Sch Meds:   Scheduled Meds: . chlorhexidine  15 mL Mouth Rinse BID  . mouth rinse  15 mL Mouth Rinse q12n4p  . pantoprazole (PROTONIX) IV  40 mg Intravenous Q12H  . sodium chloride flush  10-40 mL Intracatheter Q12H  . sodium chloride flush  3 mL Intravenous Once   Continuous Infusions: . dextrose 5 % and 0.9% NaCl Stopped (05/03/19 0646)   PRN Meds:.ondansetron **OR** ondansetron (ZOFRAN) IV, ondansetron (ZOFRAN) IV, sodium chloride flush, sodium chloride flush   Trelon Plush T. [place]  If 7PM-7AM, please contact night-coverage [email or web address] Password [email or web address] 05/03/2019, 2:00 PM

## 2019-05-04 DIAGNOSIS — K3189 Other diseases of stomach and duodenum: Secondary | ICD-10-CM

## 2019-05-04 LAB — COMPREHENSIVE METABOLIC PANEL
ALT: 16 U/L (ref 0–44)
AST: 17 U/L (ref 15–41)
Albumin: 2.1 g/dL — ABNORMAL LOW (ref 3.5–5.0)
Alkaline Phosphatase: 80 U/L (ref 38–126)
Anion gap: 4 — ABNORMAL LOW (ref 5–15)
BUN: 17 mg/dL (ref 8–23)
CO2: 23 mmol/L (ref 22–32)
Calcium: 8.2 mg/dL — ABNORMAL LOW (ref 8.9–10.3)
Chloride: 113 mmol/L — ABNORMAL HIGH (ref 98–111)
Creatinine, Ser: 1.31 mg/dL — ABNORMAL HIGH (ref 0.44–1.00)
GFR calc Af Amer: 44 mL/min — ABNORMAL LOW (ref >60)
GFR calc non Af Amer: 38 mL/min — ABNORMAL LOW (ref >60)
Glucose, Bld: 120 mg/dL — ABNORMAL HIGH (ref 70–99)
Potassium: 3.4 mmol/L — ABNORMAL LOW (ref 3.5–5.1)
Sodium: 140 mmol/L (ref 135–145)
Total Bilirubin: 0.6 mg/dL (ref 0.3–1.2)
Total Protein: 4.5 g/dL — ABNORMAL LOW (ref 6.5–8.1)

## 2019-05-04 LAB — CBC
HCT: 39.2 % (ref 36.0–46.0)
Hemoglobin: 11.6 g/dL — ABNORMAL LOW (ref 12.0–15.0)
MCH: 29.1 pg (ref 26.0–34.0)
MCHC: 29.6 g/dL — ABNORMAL LOW (ref 30.0–36.0)
MCV: 98.5 fL (ref 80.0–100.0)
Platelets: 166 10*3/uL (ref 150–400)
RBC: 3.98 MIL/uL (ref 3.87–5.11)
RDW: 14.7 % (ref 11.5–15.5)
WBC: 7.8 10*3/uL (ref 4.0–10.5)
nRBC: 0 % (ref 0.0–0.2)

## 2019-05-04 LAB — MAGNESIUM: Magnesium: 1.8 mg/dL (ref 1.7–2.4)

## 2019-05-04 MED ORDER — POTASSIUM CHLORIDE 10 MEQ/100ML IV SOLN
10.0000 meq | INTRAVENOUS | Status: AC
  Administered 2019-05-04 (×4): 10 meq via INTRAVENOUS
  Filled 2019-05-04 (×4): qty 100

## 2019-05-04 MED ORDER — MORPHINE SULFATE (PF) 4 MG/ML IV SOLN
3.0000 mg | INTRAVENOUS | Status: DC | PRN
  Administered 2019-05-04 – 2019-05-14 (×26): 3 mg via INTRAVENOUS
  Filled 2019-05-04 (×26): qty 1

## 2019-05-04 NOTE — Plan of Care (Signed)
  Problem: Education: Goal: Knowledge of General Education information will improve Description: Including pain rating scale, medication(s)/side effects and non-pharmacologic comfort measures Outcome: Progressing   Problem: Clinical Measurements: Goal: Will remain free from infection Outcome: Progressing Goal: Respiratory complications will improve Outcome: Progressing Goal: Cardiovascular complication will be avoided Outcome: Progressing   Problem: Activity: Goal: Risk for activity intolerance will decrease Outcome: Progressing   Problem: Coping: Goal: Level of anxiety will decrease Outcome: Progressing   Problem: Safety: Goal: Ability to remain free from injury will improve Outcome: Progressing

## 2019-05-04 NOTE — Progress Notes (Signed)
PROGRESS NOTE  [name] ZTI:[id number] DOB: 1934-11-08   PCP: Burnard Bunting, MD  Patient is from: Home  DOA: 05/01/2019 LOS: 3  Brief Narrative / Interim history: 84 year old female with history of DM-2, GERD, HTN, morbid obesity, hyperlipidemia, diverticulosis, hyperparathyroidism, anxiety disorder and bronchiectasis; admitted on 05/01/2019, presented with complaint of nausea and vomiting, was found to have gastric outlet obstruction concerning for malignancy. Had EGD by gastroenterology on 05/03/2019 revealed a esophagitis, nonbleeding erosive gastropathy, gastric ulcer with a clean ulcer base, multiple gastric polyps, acquired duodenal stenosis and mucosal change in the duodenum.  Biopsies obtained.  Subjective: No major events overnight of this morning.  Had about 500 cc light serosanguineous fluid from NG tube.  Reports some mild abdominal pain over LLQ that she describes as a twinge.  Reports nausea this morning.  No chest pain, dyspnea or GU symptoms.  Objective: Vitals:   05/03/19 0950 05/03/19 1340 05/03/19 2205 05/04/19 0607  BP: 139/63 (!) 133/55 113/65 122/76  Pulse: 65 71 69 77  Resp:  16 20 18   Temp: (!) 97.5 F (36.4 C) 98 F (36.7 C) 99.4 F (37.4 C) 99.2 F (37.3 C)  TempSrc: Oral  Oral Oral  SpO2: 100% 99% 97% 99%  Weight:      Height:        Intake/Output Summary (Last 24 hours) at 05/04/2019 1132 Last data filed at 05/04/2019 0900 Gross per 24 hour  Intake 1372.22 ml  Output 600 ml  Net 772.22 ml   Filed Weights   05/01/19 2238  Weight: 94.6 kg    Examination:  GENERAL: No acute distress.  Appears well.  HEENT: MMM.  Vision and hearing grossly intact.  NG tube to wall suction. NECK: Supple.  No apparent JVD.  RESP:  No IWOB. Good air movement bilaterally. CVS:  RRR. Heart sounds normal.  ABD/GI/GU: Bowel sounds present. Soft.  Mild diffuse tenderness. MSK/EXT:  Moves extremities. No apparent deformity or edema.  SKIN: no apparent skin  lesion or wound NEURO: Awake, alert and oriented appropriately.  No gross deficit.  PSYCH: Calm. Normal affect.    I have personally reviewed the following labs and images:  Radiology Studies: No results found.  Microbiology: Recent Results (from the past 240 hour(s))  SARS Coronavirus 2 (CEPHEID - Performed in [place] hospital lab), Hosp Order     Status: None   Collection Time: 05/01/19  3:46 PM   Specimen: Nasopharyngeal Swab  Result Value Ref Range Status   SARS Coronavirus 2 NEGATIVE NEGATIVE Final    Comment: (NOTE) If result is NEGATIVE SARS-CoV-2 target nucleic acids are NOT DETECTED. The SARS-CoV-2 RNA is generally detectable in upper and lower  respiratory specimens during the acute phase of infection. The lowest  concentration of SARS-CoV-2 viral copies this assay can detect is 250  copies / mL. A negative result does not preclude SARS-CoV-2 infection  and should not be used as the sole basis for treatment or other  patient management decisions.  A negative result may occur with  improper specimen collection / handling, submission of specimen other  than nasopharyngeal swab, presence of viral mutation(s) within the  areas targeted by this assay, and inadequate number of viral copies  (<250 copies / mL). A negative result must be combined with clinical  observations, patient history, and epidemiological information. If result is POSITIVE SARS-CoV-2 target nucleic acids are DETECTED. The SARS-CoV-2 RNA is generally detectable in upper and lower  respiratory specimens dur ing the acute phase  of infection.  Positive  results are indicative of active infection with SARS-CoV-2.  Clinical  correlation with patient history and other diagnostic information is  necessary to determine patient infection status.  Positive results do  not rule out bacterial infection or co-infection with other viruses. If result is PRESUMPTIVE POSTIVE SARS-CoV-2 nucleic acids MAY BE PRESENT.    A presumptive positive result was obtained on the submitted specimen  and confirmed on repeat testing.  While 2019 novel coronavirus  (SARS-CoV-2) nucleic acids may be present in the submitted sample  additional confirmatory testing may be necessary for epidemiological  and / or clinical management purposes  to differentiate between  SARS-CoV-2 and other Sarbecovirus currently known to infect humans.  If clinically indicated additional testing with an alternate test  methodology [id number]) is advised. The SARS-CoV-2 RNA is generally  detectable in upper and lower respiratory sp ecimens during the acute  phase of infection. The expected result is Negative. Fact Sheet for Patients:  StrictlyIdeas.no Fact Sheet for Healthcare Providers: BankingDealers.co.za This test is not yet approved or cleared by the [place] FDA and has been authorized for detection and/or diagnosis of SARS-CoV-2 by FDA under an Emergency Use Authorization (EUA).  This EUA will remain in effect (meaning this test can be used) for the duration of the COVID-19 declaration under Section 564(b)(1) of the Act, 21 U.S.C. section 360bbb-3(b)(1), unless the authorization is terminated or revoked sooner. Performed at [facility], [place] [address], [place], [place] [address]     Sepsis Labs: Invalid input(s): PROCALCITONIN, LACTICIDVEN  Urine analysis:    Component Value Date/Time   COLORURINE YELLOW 05/02/2019 0832   APPEARANCEUR CLEAR 05/02/2019 0832   LABSPEC 1.017 05/02/2019 0832   PHURINE 5.0 05/02/2019 0832   GLUCOSEU NEGATIVE 05/02/2019 0832   HGBUR SMALL (A) 05/02/2019 0832   BILIRUBINUR NEGATIVE 05/02/2019 0832   KETONESUR NEGATIVE 05/02/2019 0832   PROTEINUR NEGATIVE 05/02/2019 0832   UROBILINOGEN 0.2 06/01/2010 0915   NITRITE NEGATIVE 05/02/2019 0832   LEUKOCYTESUR SMALL (A) 05/02/2019 0832    Anemia Panel: No results for  input(s): VITAMINB12, FOLATE, FERRITIN, TIBC, IRON, RETICCTPCT in the last 72 hours.  Thyroid Function Tests: No results for input(s): TSH, T4TOTAL, FREET4, T3FREE, THYROIDAB in the last 72 hours.  Lipid Profile: No results for input(s): CHOL, HDL, LDLCALC, TRIG, CHOLHDL, LDLDIRECT in the last 72 hours.  CBG: No results for input(s): GLUCAP in the last 168 hours.  HbA1C: No results for input(s): HGBA1C in the last 72 hours.  BNP (last 3 results): No results for input(s): PROBNP in the last 8760 hours.  Cardiac Enzymes: No results for input(s): CKTOTAL, CKMB, CKMBINDEX, TROPONINI in the last 168 hours.  Coagulation Profile: No results for input(s): INR, PROTIME in the last 168 hours.  Liver Function Tests: Recent Labs  Lab 05/01/19 1524 05/02/19 0303 05/03/19 0638 05/04/19 0345  AST 32 24 18 17   ALT 24 20 16 16   ALKPHOS 105 84 78 80  BILITOT 0.9 0.4 0.3 0.6  PROT 6.4* 4.9* 4.6* 4.5*  ALBUMIN 3.3* 2.4* 2.2* 2.1*   Recent Labs  Lab 05/01/19 1524  LIPASE 30   No results for input(s): AMMONIA in the last 168 hours.  Basic Metabolic Panel: Recent Labs  Lab 05/01/19 1524 05/01/19 1527 05/02/19 0303 05/03/19 0638 05/04/19 0345  NA 137  --  137 138 140  K 3.7  --  3.8 3.9 3.4*  CL 97*  --  101 107 113*  CO2 28  --  26 26 23   GLUCOSE 126*  --  137* 137* 120*  BUN 33*  --  30* 23 17  CREATININE 2.04*  --  1.89* 1.49* 1.31*  CALCIUM 10.8*  --  9.7 8.5* 8.2*  MG  --  2.1  --  1.8 1.8   GFR: Estimated Creatinine Clearance: 36.8 mL/min (A) (by C-G formula based on SCr of 1.31 mg/dL (H)).  CBC: Recent Labs  Lab 05/01/19 1524 05/02/19 0303 05/03/19 0638 05/04/19 0345  WBC 9.8 8.2 8.7 7.8  NEUTROABS  --   --  5.7  --   HGB 14.2 12.4 11.5* 11.6*  HCT 45.6 40.1 37.8 39.2  MCV 94.4 96.4 97.4 98.5  PLT 361 273 236 166    Procedures:  An esophagitis distally, nonbleeding erosive gastropathy, gastric ulcer with a clean ulcer base, multiple gastric polyps,  acquired duodenal stenosis and mucosal change in the duodenum.  Biopsies obtained.  EGD on 05/03/2019  Microbiology summarized: COVID-19 negative.  Assessment & Plan: Acquired duodenal stenosis/gastric outlet obstruction -Reportedly not amenable to endoscopy.  Concern for malignancy. -Follow pathology obtained during EGD. -General surgery consulted-appreciate input -Continue n.p.o., IVF, antiemetics, PPI and NG tube per GI. -Closely monitor electrolytes and replenish aggressively.  AKI: Likely due to GI loss from nausea and vomiting.  Resolved. -Monitor renal function -Continue IV fluid  Hypertension: Normotensive -Continue diuretics  Hypertension: Normotensive -Hold home losartan and diuretics.  GERD: -Continue PPI  Hypothyroidism: -IV Synthroid while n.p.o.  Primary hyperparathyroidism: Stable -On Reclast outpatient.  Obstructive sleep apnea -Nightly CPAP if she tolerates.  Class II obesity: BMI 36.94 -Encourage lifestyle change to lose weight  DVT prophylaxis: Subcu heparin Code Status: Full code Family Communication: Patient and/or RN. Available if any question.  Disposition Plan: Remains inpatient for further evaluation of gastric outlet obstruction. Consultants: GI, general surgery   Antimicrobials: Anti-infectives (From admission, onward)   None      Sch Meds:  Scheduled Meds: . chlorhexidine  15 mL Mouth Rinse BID  . heparin  5,000 Units Subcutaneous Q8H  . levothyroxine  25 mcg Intravenous Daily  . mouth rinse  15 mL Mouth Rinse q12n4p  . pantoprazole (PROTONIX) IV  40 mg Intravenous Q12H  . sodium chloride flush  10-40 mL Intracatheter Q12H   Continuous Infusions: . dextrose 5 % and 0.9% NaCl 125 mL/hr at 05/04/19 0600  . potassium chloride 10 mEq (05/04/19 1039)   PRN Meds:.ondansetron **OR** ondansetron (ZOFRAN) IV, ondansetron (ZOFRAN) IV, phenol, sodium chloride flush, sodium chloride flush    T. Armstrong  If  7PM-7AM, please contact night-coverage [email or web address] Password [facility] 05/04/2019, 11:32 AM

## 2019-05-04 NOTE — Progress Notes (Signed)
Gastroenterology Inpatient Follow-up Note   PATIENT IDENTIFICATION  ODEAN MCELWAIN is a 84 y.o. female with a pmh significant for anxiety/depression, hypertension, hyperlipidemia, GERD, diabetes, glaucoma, sleep apnea, hypothyroidism, recent diverticulitis.  The GI service is asked to evaluate the patient in the setting of recurrent nausea/vomiting/abdominal pain concerning for outlet obstruction and found to have a duodenal obstruction. Hospital Day: 4  SUBJECTIVE  Patient describes no significant changes from Saturday into Sunday.  Would like to try ice chips if possible.  No worsened abdominal pain.  Nausea seems to be decently controlled though NG tube is in place.  Patient denies any fevers or chills.   OBJECTIVE  Scheduled Inpatient Medications:  . chlorhexidine  15 mL Mouth Rinse BID  . heparin  5,000 Units Subcutaneous Q8H  . levothyroxine  25 mcg Intravenous Daily  . mouth rinse  15 mL Mouth Rinse q12n4p  . pantoprazole (PROTONIX) IV  40 mg Intravenous Q12H  . sodium chloride flush  10-40 mL Intracatheter Q12H   Continuous Inpatient Infusions:  . dextrose 5 % and 0.9% NaCl 125 mL/hr at 05/04/19 0600  . potassium chloride     PRN Inpatient Medications: ondansetron **OR** ondansetron (ZOFRAN) IV, ondansetron (ZOFRAN) IV, phenol, sodium chloride flush, sodium chloride flush   Physical Examination  Temp:  [97.5 F (36.4 C)-99.4 F (37.4 C)] 99.2 F (37.3 C) (08/02 0607) Pulse Rate:  [64-77] 77 (08/02 0607) Resp:  [8-22] 18 (08/02 0607) BP: (112-139)/(55-77) 122/76 (08/02 0607) SpO2:  [93 %-100 %] 99 % (08/02 0607) Temp (24hrs), Avg:98.5 F (36.9 C), Min:97.5 F (36.4 C), Max:99.4 F (37.4 C)  Weight: 94.6 kg GEN: NAD, appears stated age, doesn't appear chronically ill PSYCH: Cooperative, without pressured speech EYE: Conjunctivae pink, sclerae anicteric ENT: Dry mucous membranes, NG tube in place to low intermittent wall suction CV: Non-tachycardic RESP: Decreased  breath sounds at the bases bilaterally GI: NABS, soft, protuberant, rounded, minimal tenderness to palpation in the midepigastrium and right upper quadrant and subxiphoid region, no guarding or rebound appreciated  MSK/EXT: Bilateral lower extremity edema present SKIN: No jaundice NEURO:  Alert & Oriented x 3, no focal deficits   Review of Data   Laboratory Studies   Recent Labs  Lab 05/04/19 0345  NA 140  K 3.4*  CL 113*  CO2 23  BUN 17  CREATININE 1.31*  GLUCOSE 120*  CALCIUM 8.2*  MG 1.8   Recent Labs  Lab 05/04/19 0345  AST 17  ALT 16  ALKPHOS 80    Recent Labs  Lab 05/02/19 0303 05/03/19 0638 05/04/19 0345  WBC 8.2 8.7 7.8  HGB 12.4 11.5* 11.6*  HCT 40.1 37.8 39.2  PLT 273 236 166   No results for input(s): APTT, INR in the last 168 hours.  Imaging Studies  No new imaging studies to review  GI Procedures and Studies  EGD - No gross lesions in esophagus. - LA Grade B esophagitis distally. - Small hiatal hernia. - Retained gastric fluid. Suctioned via endoscope. - Non-bleeding erosive gastropathy. - Gastric ulcers with a clean ulcer base ([place] Class III). - Multiple gastric polyps. Biopsied. - Biopsied stomach for HP evaluation. - Acquired duodenal stenosis. Mucosal changes in the duodenum. Biopsied. - Feeding tube placement was successfully performed.  ASSESSMENT  Ms. Maciolek is a 84 y.o. female with a pmh significant for anxiety/depression, hypertension, hyperlipidemia, GERD, diabetes, glaucoma, sleep apnea, hypothyroidism, recent diverticulitis.  The GI service is asked to evaluate the patient in the setting of recurrent  nausea/vomiting/abdominal pain concerning for outlet obstruction and found to have a duodenal obstruction.  The patient is clinically stable from Saturday into Sunday.  Overall does not have significant abdominal distention and gastric distention as NG tube remains in place.  Biopsies still pending at this point in time.  I remain  significantly concerned about the possibility of an underlying malignancy.  She has peptic ulcer disease as well and we are awaiting biopsies to understand whether she may have H. pylori though these changes of esophagitis and gastric erosions and duodenal inflammation likely as a result of stasis.  Okay today to allow patient to have ice chips.  May want to ask surgery to become involved just to be aware of patient that may end up needing either a surgical bypass for cancer if this turns out to be cancer or for a bypass if this turns out to be ulcer disease.  If biopsy is returned and are unremarkable I would recommend one more attempt at endoscopy to obtain more biopsies to ensure we are not missing a malignancy due to sample error.   PLAN/RECOMMENDATIONS  Follow-up biopsies IV PPI twice daily for now We will initiate patient diet upgrade to ice chips Consider general surgery discussion today 8/2 or on 8/3 as the patient is going to need some sort of management for this outlet obstruction-defer to primary service to call Keep NG tube in place, if patient feels comfortable and output of NG tube is less than 500 cc over the course of morning then would clamp and see how she does otherwise may remain on low intermittent wall suction   Dr. Hilarie Fredrickson takes over service of inpatient [facility] on 8/3.  Please page/call with questions or concerns.   Justice Britain, MD [facility] Office # [phone]    LOS: 3 days  Irving Copas  05/04/2019, 8:06 AM

## 2019-05-04 NOTE — Consult Note (Signed)
Reason for Consult: vomiting Referring Physician: Wendee Beavers  [name] is an 84 y.o. female.  HPI: 84 yo female with 3 month history of nausea and vomiting. It has worsened and in the last week she was vomiting up with liquids as well. She has 20 lb weight loss. She also notes a large amount of diarrhea. She has diffuse abdominal pain. She has undergone 2 endoscopies showing a stricture vs mass at d1/d2 with dilated stomach and retained food/fluid CT scan without contrast performed showing tapering but no definite masses of the pancreas/duodenum/liver.  Past Medical History:  Diagnosis Date  . Allergy   . Anemia   . Anxiety   . Bronchiectasis ([place]) 10/01/2015  . Cataract    removed both eyes  . Depression   . Diabetes mellitus without complication ([place])    diet controlled DM- no meds   . Diverticulosis of colon (without mention of hemorrhage) 07/17/2007  . Gastritis 07/17/2007  . GERD (gastroesophageal reflux disease)   . Glaucoma   . History of fatty infiltration of liver   . Hyperlipemia   . Hyperparathyroidism ([place])   . Hypertension    under control   . Lumbosacral root lesions, not elsewhere classified 04/13/2014  . Obese   . Osteoarthritis   . Osteoporosis   . PONV (postoperative nausea and vomiting)    Not in last several surgeries  . Renal calculus, right    severe  . Sleep apnea    uses cpap at night   . Thyroid disease     Past Surgical History:  Procedure Laterality Date  . APPENDECTOMY  1974  . BREAST BIOPSY  1988   LEFT--BENIGN   . CESAREAN SECTION  1974  . CESAREAN SECTION  1971  . CHOLECYSTECTOMY  2000  . COLONOSCOPY  2008  . ESOPHAGOGASTRODUODENOSCOPY  2008  . EYE SURGERY     Lasik, Cataract, Hx of Glaucoma  . KNEE ARTHROPLASTY  2004   RIGHT-TOTAL   . LUMBAR FUSION  2008  . PARATHYROIDECTOMY  2009   X 2--RIGHT INFERIOR AND LEFT SUPERIOR  . PARATHYROIDECTOMY N/A 01/04/2017   Procedure: RIGHT NECK EXPLORATION WITH PARATHYROIDECTOMY;   Surgeon: Armandina Gemma, MD;  Location: [facility];  Service: General;  Laterality: N/A;  . ROTATOR CUFF REPAIR  2000   RIGHT  . TUBAL LIGATION  1974  . UPPER GASTROINTESTINAL ENDOSCOPY      Family History  Problem Relation Age of Onset  . Colon polyps Mother   . Hypertension Mother   . Osteoarthritis Mother   . Diabetes Father   . Stroke Father   . Hernia Father        Esophageal hernia  . Osteoarthritis Maternal Grandmother   . Stroke Maternal Grandfather   . Liver disease Paternal Grandmother   . Stroke Paternal Grandfather   . Colon cancer Neg Hx   . Esophageal cancer Neg Hx   . Rectal cancer Neg Hx   . Stomach cancer Neg Hx     Social History:  reports that she quit smoking about 4 years ago. Her smoking use included cigarettes. She has a 3.75 pack-year smoking history. She has never used smokeless tobacco. She reports current alcohol use of about 1.0 standard drinks of alcohol per week. She reports that she does not use drugs.  Allergies:  Allergies  Allergen Reactions  . Codeine Nausea And Vomiting  . Contrast Media [Iodinated Diagnostic Agents] Hives, Shortness Of Breath and Itching  . Iohexol Hives and  Other (See Comments)    REACTION: " TROUBLE BREATHING"  . Aspirin     In high doses bleeding  . Fentanyl     Altered mental state    Medications: I have reviewed the patient's current medications.  Results for orders placed or performed during the hospital encounter of 05/01/19 (from the past 48 hour(s))  CBC with Differential/Platelet     Status: Abnormal   Collection Time: 05/03/19  6:38 AM  Result Value Ref Range   WBC 8.7 4.0 - 10.5 K/uL   RBC 3.88 3.87 - 5.11 MIL/uL   Hemoglobin 11.5 (L) 12.0 - 15.0 g/dL   HCT 37.8 36.0 - 46.0 %   MCV 97.4 80.0 - 100.0 fL   MCH 29.6 26.0 - 34.0 pg   MCHC 30.4 30.0 - 36.0 g/dL   RDW 14.9 11.5 - 15.5 %   Platelets 236 150 - 400 K/uL   nRBC 0.0 0.0 - 0.2 %   Neutrophils Relative % 64 %   Neutro Abs 5.7 1.7 - 7.7 K/uL    Lymphocytes Relative 18 %   Lymphs Abs 1.6 0.7 - 4.0 K/uL   Monocytes Relative 12 %   Monocytes Absolute 1.0 0.1 - 1.0 K/uL   Eosinophils Relative 4 %   Eosinophils Absolute 0.4 0.0 - 0.5 K/uL   Basophils Relative 1 %   Basophils Absolute 0.0 0.0 - 0.1 K/uL   Immature Granulocytes 1 %   Abs Immature Granulocytes 0.04 0.00 - 0.07 K/uL    Comment: Performed at [facility], [place] [address], [place], [place] [address]  Comprehensive metabolic panel     Status: Abnormal   Collection Time: 05/03/19  6:38 AM  Result Value Ref Range   Sodium 138 135 - 145 mmol/L   Potassium 3.9 3.5 - 5.1 mmol/L   Chloride 107 98 - 111 mmol/L   CO2 26 22 - 32 mmol/L   Glucose, Bld 137 (H) 70 - 99 mg/dL   BUN 23 8 - 23 mg/dL   Creatinine, Ser 1.49 (H) 0.44 - 1.00 mg/dL   Calcium 8.5 (L) 8.9 - 10.3 mg/dL   Total Protein 4.6 (L) 6.5 - 8.1 g/dL   Albumin 2.2 (L) 3.5 - 5.0 g/dL   AST 18 15 - 41 U/L   ALT 16 0 - 44 U/L   Alkaline Phosphatase 78 38 - 126 U/L   Total Bilirubin 0.3 0.3 - 1.2 mg/dL   GFR calc non Af Amer 33 (L) >60 mL/min   GFR calc Af Amer 38 (L) >60 mL/min   Anion gap 5 5 - 15    Comment: Performed at [facility], [place] [address], [place], [place] [address]  Magnesium     Status: None   Collection Time: 05/03/19  6:38 AM  Result Value Ref Range   Magnesium 1.8 1.7 - 2.4 mg/dL    Comment: Performed at [facility], [place] [address], [place], [place] [address]  Comprehensive metabolic panel     Status: Abnormal   Collection Time: 05/04/19  3:45 AM  Result Value Ref Range   Sodium 140 135 - 145 mmol/L   Potassium 3.4 (L) 3.5 - 5.1 mmol/L   Chloride 113 (H) 98 - 111 mmol/L   CO2 23 22 - 32 mmol/L   Glucose, Bld 120 (H) 70 - 99 mg/dL   BUN 17 8 - 23 mg/dL   Creatinine, Ser 1.31 (H) 0.44 - 1.00 mg/dL   Calcium 8.2 (  L) 8.9 - 10.3 mg/dL   Total Protein 4.5 (L) 6.5 - 8.1 g/dL   Albumin 2.1 (L) 3.5 - 5.0 g/dL   AST 17 15 - 41 U/L   ALT 16  0 - 44 U/L   Alkaline Phosphatase 80 38 - 126 U/L   Total Bilirubin 0.6 0.3 - 1.2 mg/dL   GFR calc non Af Amer 38 (L) >60 mL/min   GFR calc Af Amer 44 (L) >60 mL/min   Anion gap 4 (L) 5 - 15    Comment: Performed at [facility], [place] [address], [place], [place] [address]  CBC     Status: Abnormal   Collection Time: 05/04/19  3:45 AM  Result Value Ref Range   WBC 7.8 4.0 - 10.5 K/uL   RBC 3.98 3.87 - 5.11 MIL/uL   Hemoglobin 11.6 (L) 12.0 - 15.0 g/dL   HCT 39.2 36.0 - 46.0 %   MCV 98.5 80.0 - 100.0 fL   MCH 29.1 26.0 - 34.0 pg   MCHC 29.6 (L) 30.0 - 36.0 g/dL   RDW 14.7 11.5 - 15.5 %   Platelets 166 150 - 400 K/uL   nRBC 0.0 0.0 - 0.2 %    Comment: Performed at [facility], [place] [address] [place], [place] [address]  Magnesium     Status: None   Collection Time: 05/04/19  3:45 AM  Result Value Ref Range   Magnesium 1.8 1.7 - 2.4 mg/dL    Comment: Performed at [facility], [place] [address], [place], [place] [address]    No results found.  Review of Systems  Constitutional: Positive for weight loss. Negative for chills and fever.  HENT: Negative for hearing loss.   Eyes: Negative for blurred vision and double vision.  Respiratory: Negative for cough and hemoptysis.   Cardiovascular: Negative for chest pain and palpitations.  Gastrointestinal: Positive for abdominal pain, diarrhea, nausea and vomiting.  Genitourinary: Negative for dysuria and urgency.  Musculoskeletal: Negative for myalgias and neck pain.  Skin: Negative for itching and rash.  Neurological: Negative for dizziness, tingling and headaches.  Endo/Heme/Allergies: Does not bruise/bleed easily.  Psychiatric/Behavioral: Negative for depression and suicidal ideas.   Blood pressure 122/76, pulse 77, temperature 99.2 F (37.3 C), temperature source Oral, resp. rate 18, height 5\' 3"  (1.6 m), weight 94.6 kg, SpO2 99 %. Physical Exam  Vitals reviewed.  Constitutional: She is oriented to person, place, and time. She appears well-developed and well-nourished.  HENT:  Head: Normocephalic and atraumatic.  Eyes: Pupils are equal, round, and reactive to light. Conjunctivae and EOM are normal.  Neck: Normal range of motion. Neck supple.  Cardiovascular: Normal rate and regular rhythm.  Respiratory: Effort normal and breath sounds normal.  GI: Soft. Bowel sounds are normal. She exhibits no distension. There is abdominal tenderness.  Musculoskeletal: Normal range of motion.  Neurological: She is alert and oriented to person, place, and time.  Skin: Skin is warm and dry.  Psychiatric: She has a normal mood and affect. Her behavior is normal.    Assessment/Plan: 84 yo female with diabetes, obesity and 3 month history of nausea and vomiting found to have GOO from narrowing at d1/d2. Biopsies pending -will follow up biopsies -will likely need surgical intervention for bypass of this lesion vs resection vs feeding tube placement(s)  Arta Bruce  05/04/2019, 9:03 AM

## 2019-05-05 ENCOUNTER — Ambulatory Visit (HOSPITAL_COMMUNITY): Admission: RE | Admit: 2019-05-05 | Payer: Medicare Other | Source: Ambulatory Visit

## 2019-05-05 ENCOUNTER — Encounter (HOSPITAL_COMMUNITY): Payer: Self-pay | Admitting: Surgery

## 2019-05-05 ENCOUNTER — Inpatient Hospital Stay: Payer: Self-pay

## 2019-05-05 DIAGNOSIS — K311 Adult hypertrophic pyloric stenosis: Secondary | ICD-10-CM

## 2019-05-05 DIAGNOSIS — K315 Obstruction of duodenum: Secondary | ICD-10-CM

## 2019-05-05 LAB — BASIC METABOLIC PANEL
Anion gap: 5 (ref 5–15)
BUN: 15 mg/dL (ref 8–23)
CO2: 21 mmol/L — ABNORMAL LOW (ref 22–32)
Calcium: 8.2 mg/dL — ABNORMAL LOW (ref 8.9–10.3)
Chloride: 115 mmol/L — ABNORMAL HIGH (ref 98–111)
Creatinine, Ser: 1.14 mg/dL — ABNORMAL HIGH (ref 0.44–1.00)
GFR calc Af Amer: 52 mL/min — ABNORMAL LOW (ref >60)
GFR calc non Af Amer: 45 mL/min — ABNORMAL LOW (ref >60)
Glucose, Bld: 158 mg/dL — ABNORMAL HIGH (ref 70–99)
Potassium: 3.5 mmol/L (ref 3.5–5.1)
Sodium: 141 mmol/L (ref 135–145)

## 2019-05-05 LAB — PREALBUMIN: Prealbumin: <5 mg/dL — ABNORMAL LOW (ref 18–38)

## 2019-05-05 MED ORDER — POTASSIUM CHLORIDE 10 MEQ/100ML IV SOLN
10.0000 meq | INTRAVENOUS | Status: AC
  Administered 2019-05-05 (×4): 10 meq via INTRAVENOUS
  Filled 2019-05-05 (×4): qty 100

## 2019-05-05 MED ORDER — MENTHOL 3 MG MT LOZG
1.0000 | LOZENGE | OROMUCOSAL | Status: DC | PRN
  Filled 2019-05-05: qty 9

## 2019-05-05 MED ORDER — MAGNESIUM SULFATE 2 GM/50ML IV SOLN
2.0000 g | Freq: Once | INTRAVENOUS | Status: AC
  Administered 2019-05-05: 2 g via INTRAVENOUS
  Filled 2019-05-05: qty 50

## 2019-05-05 MED ORDER — SODIUM CHLORIDE 0.9 % IV SOLN
INTRAVENOUS | Status: DC | PRN
  Administered 2019-05-05: 250 mL via INTRAVENOUS

## 2019-05-05 MED ORDER — SODIUM CHLORIDE 0.9% FLUSH: 10.0000 mL | INTRAVENOUS | Status: DC | PRN

## 2019-05-05 NOTE — Progress Notes (Signed)
PROGRESS NOTE  [name] GDJ:[id number] DOB: 02-05-1935   PCP: Burnard Bunting, MD  Patient is from: Home  DOA: 05/01/2019 LOS: 4  Brief Narrative / Interim history: 84 year old female with history of DM-2, GERD, HTN, morbid obesity, hyperlipidemia, diverticulosis, hyperparathyroidism, anxiety disorder and bronchiectasis; admitted on 05/01/2019, presented with complaint of nausea and vomiting, was found to have gastric outlet obstruction concerning for malignancy. Had EGD by gastroenterology on 05/03/2019 revealed a esophagitis, nonbleeding erosive gastropathy, gastric ulcer with a clean ulcer base, multiple gastric polyps, acquired duodenal stenosis and mucosal change in the duodenum.  Biopsies pending.  Subjective: No major events overnight of this morning.  Intake and output was not captured adequately.  She denies abdominal pain.  Had some nausea this morning.  Denies chest pain or dyspnea.  Objective: Vitals:   05/04/19 0607 05/04/19 1313 05/04/19 2135 05/05/19 0549  BP: 122/76 (!) 107/59 (!) 128/50 (!) 128/53  Pulse: 77 71 72 76  Resp: 18 16 20 20   Temp: 99.2 F (37.3 C) 98.3 F (36.8 C) 99.1 F (37.3 C) 99 F (37.2 C)  TempSrc: Oral  Oral Oral  SpO2: 99% 98% 96% 96%  Weight:      Height:        Intake/Output Summary (Last 24 hours) at 05/05/2019 1216 Last data filed at 05/05/2019 1000 Gross per 24 hour  Intake 2895.19 ml  Output 1350 ml  Net 1545.19 ml   Filed Weights   05/01/19 2238  Weight: 94.6 kg    Examination:  GENERAL: No acute distress.  Appears well.  HEENT: MMM.  Vision and hearing grossly intact.  NG tube to wall suction. NECK: Supple.  No apparent JVD.  RESP:  No IWOB. Good air movement bilaterally. CVS:  RRR. Heart sounds normal.  ABD/GI/GU: Bowel sounds present. Soft.  Diffuse tenderness to palpation. MSK/EXT:  Moves extremities. No apparent deformity or edema.  SKIN: no apparent skin lesion or wound NEURO: Awake, alert and oriented  appropriately.  No gross deficit.  PSYCH: Calm. Normal affect.    I have personally reviewed the following labs and images:  Radiology Studies: No results found.  Microbiology: Recent Results (from the past 240 hour(s))  SARS Coronavirus 2 (CEPHEID - Performed in [facility] hospital lab), Hosp Order     Status: None   Collection Time: 05/01/19  3:46 PM   Specimen: Nasopharyngeal Swab  Result Value Ref Range Status   SARS Coronavirus 2 NEGATIVE NEGATIVE Final    Comment: (NOTE) If result is NEGATIVE SARS-CoV-2 target nucleic acids are NOT DETECTED. The SARS-CoV-2 RNA is generally detectable in upper and lower  respiratory specimens during the acute phase of infection. The lowest  concentration of SARS-CoV-2 viral copies this assay can detect is 250  copies / mL. A negative result does not preclude SARS-CoV-2 infection  and should not be used as the sole basis for treatment or other  patient management decisions.  A negative result may occur with  improper specimen collection / handling, submission of specimen other  than nasopharyngeal swab, presence of viral mutation(s) within the  areas targeted by this assay, and inadequate number of viral copies  (<250 copies / mL). A negative result must be combined with clinical  observations, patient history, and epidemiological information. If result is POSITIVE SARS-CoV-2 target nucleic acids are DETECTED. The SARS-CoV-2 RNA is generally detectable in upper and lower  respiratory specimens dur ing the acute phase of infection.  Positive  results are indicative of active infection  with SARS-CoV-2.  Clinical  correlation with patient history and other diagnostic information is  necessary to determine patient infection status.  Positive results do  not rule out bacterial infection or co-infection with other viruses. If result is PRESUMPTIVE POSTIVE SARS-CoV-2 nucleic acids MAY BE PRESENT.   A presumptive positive result was obtained on  the submitted specimen  and confirmed on repeat testing.  While 2019 novel coronavirus  (SARS-CoV-2) nucleic acids may be present in the submitted sample  additional confirmatory testing may be necessary for epidemiological  and / or clinical management purposes  to differentiate between  SARS-CoV-2 and other Sarbecovirus currently known to infect humans.  If clinically indicated additional testing with an alternate test  methodology [phone]) is advised. The SARS-CoV-2 RNA is generally  detectable in upper and lower respiratory sp ecimens during the acute  phase of infection. The expected result is Negative. Fact Sheet for Patients:  StrictlyIdeas.no Fact Sheet for Healthcare Providers: BankingDealers.co.za This test is not yet approved or cleared by the [place] FDA and has been authorized for detection and/or diagnosis of SARS-CoV-2 by FDA under an Emergency Use Authorization (EUA).  This EUA will remain in effect (meaning this test can be used) for the duration of the COVID-19 declaration under Section 564(b)(1) of the Act, 21 U.S.C. section 360bbb-3(b)(1), unless the authorization is terminated or revoked sooner. Performed at [facility], [place] [address], [place], [place] [address]     Sepsis Labs: Invalid input(s): PROCALCITONIN, LACTICIDVEN  Urine analysis:    Component Value Date/Time   COLORURINE YELLOW 05/02/2019 0832   APPEARANCEUR CLEAR 05/02/2019 0832   LABSPEC 1.017 05/02/2019 0832   PHURINE 5.0 05/02/2019 0832   GLUCOSEU NEGATIVE 05/02/2019 0832   HGBUR SMALL (A) 05/02/2019 0832   BILIRUBINUR NEGATIVE 05/02/2019 0832   KETONESUR NEGATIVE 05/02/2019 0832   PROTEINUR NEGATIVE 05/02/2019 0832   UROBILINOGEN 0.2 06/01/2010 0915   NITRITE NEGATIVE 05/02/2019 0832   LEUKOCYTESUR SMALL (A) 05/02/2019 0832    Anemia Panel: No results for input(s): VITAMINB12, FOLATE, FERRITIN, TIBC, IRON,  RETICCTPCT in the last 72 hours.  Thyroid Function Tests: No results for input(s): TSH, T4TOTAL, FREET4, T3FREE, THYROIDAB in the last 72 hours.  Lipid Profile: No results for input(s): CHOL, HDL, LDLCALC, TRIG, CHOLHDL, LDLDIRECT in the last 72 hours.  CBG: No results for input(s): GLUCAP in the last 168 hours.  HbA1C: No results for input(s): HGBA1C in the last 72 hours.  BNP (last 3 results): No results for input(s): PROBNP in the last 8760 hours.  Cardiac Enzymes: No results for input(s): CKTOTAL, CKMB, CKMBINDEX, TROPONINI in the last 168 hours.  Coagulation Profile: No results for input(s): INR, PROTIME in the last 168 hours.  Liver Function Tests: Recent Labs  Lab 05/01/19 1524 05/02/19 0303 05/03/19 0638 05/04/19 0345  AST 32 24 18 17   ALT 24 20 16 16   ALKPHOS 105 84 78 80  BILITOT 0.9 0.4 0.3 0.6  PROT 6.4* 4.9* 4.6* 4.5*  ALBUMIN 3.3* 2.4* 2.2* 2.1*   Recent Labs  Lab 05/01/19 1524  LIPASE 30   No results for input(s): AMMONIA in the last 168 hours.  Basic Metabolic Panel: Recent Labs  Lab 05/01/19 1524 05/01/19 1527 05/02/19 0303 05/03/19 0638 05/04/19 0345 05/05/19 0414  NA 137  --  137 138 140 141  K 3.7  --  3.8 3.9 3.4* 3.5  CL 97*  --  101 107 113* 115*  CO2 28  --  26 26 23  21*  GLUCOSE 126*  --  137* 137* 120* 158*  BUN 33*  --  30* 23 17 15   CREATININE 2.04*  --  1.89* 1.49* 1.31* 1.14*  CALCIUM 10.8*  --  9.7 8.5* 8.2* 8.2*  MG  --  2.1  --  1.8 1.8  --    GFR: Estimated Creatinine Clearance: 42.3 mL/min (A) (by C-G formula based on SCr of 1.14 mg/dL (H)).  CBC: Recent Labs  Lab 05/01/19 1524 05/02/19 0303 05/03/19 0638 05/04/19 0345  WBC 9.8 8.2 8.7 7.8  NEUTROABS  --   --  5.7  --   HGB 14.2 12.4 11.5* 11.6*  HCT 45.6 40.1 37.8 39.2  MCV 94.4 96.4 97.4 98.5  PLT 361 273 236 166    Procedures:  An esophagitis distally, nonbleeding erosive gastropathy, gastric ulcer with a clean ulcer base, multiple gastric polyps,  acquired duodenal stenosis and mucosal change in the duodenum.  Biopsies obtained.  EGD on 05/03/2019  Microbiology summarized: COVID-19 negative.  Assessment & Plan: Acquired duodenal stenosis/gastric outlet obstruction -Reportedly not amenable to endoscopy.  Concern for malignancy. -Follow pathology obtained during EGD. -General surgery consulted-appreciate input -Continue n.p.o., IVF, antiemetics, PPI and NG tube per GI. -Closely monitor electrolytes and replenish aggressively. -We will initiate TPN today. -Consult dietitian.  AKI: Likely due to GI loss from nausea and vomiting.  Resolved. -Monitor renal function -Continue IV fluid  Hypertension: Normotensive  Hypertension: Normotensive -Hold home losartan and diuretics.  GERD: -Continue PPI  Hypothyroidism: -IV Synthroid while n.p.o.  Primary hyperparathyroidism: Stable -On Reclast outpatient.  Obstructive sleep apnea -Nightly CPAP if she tolerates but difficult with NG tube.  Class II obesity: BMI 36.94 -Encourage lifestyle change to lose weight  DVT prophylaxis: Subcu heparin Code Status: Full code Family Communication: Patient and/or RN. Available if any question.  Disposition Plan: Remains inpatient for further evaluation of gastric outlet obstruction. Consultants: GI, general surgery   Antimicrobials: Anti-infectives (From admission, onward)   None      Sch Meds:  Scheduled Meds: . chlorhexidine  15 mL Mouth Rinse BID  . heparin  5,000 Units Subcutaneous Q8H  . levothyroxine  25 mcg Intravenous Daily  . mouth rinse  15 mL Mouth Rinse q12n4p  . pantoprazole (PROTONIX) IV  40 mg Intravenous Q12H  . sodium chloride flush  10-40 mL Intracatheter Q12H   Continuous Infusions: . dextrose 5 % and 0.9% NaCl 125 mL/hr at 05/05/19 1006   PRN Meds:.menthol-cetylpyridinium, morphine injection, ondansetron **OR** ondansetron (ZOFRAN) IV, ondansetron (ZOFRAN) IV, phenol, sodium chloride flush, sodium chloride  flush   Taye T. [place]  If 7PM-7AM, please contact night-coverage [email or web address] Password [email or web address] 05/05/2019, 12:16 PM

## 2019-05-05 NOTE — Progress Notes (Signed)
[facility] Progress Note  2 Days Post-Op  Subjective: CC: nausea and dry mouth Patient having some nausea still, got medication for this but feels like it just made her sleepy and did not help nausea. Denies abdominal pain or feeling bloated at this time. Passing flatus, has not had a BM. Dry mouth from not having anything to drink, this makes it hard to talk. Taking in some ice chips.   Objective: Vital signs in last 24 hours: Temp:  [98.3 F (36.8 C)-99.1 F (37.3 C)] 99 F (37.2 C) (08/03 0549) Pulse Rate:  [71-76] 76 (08/03 0549) Resp:  [16-20] 20 (08/03 0549) BP: (107-128)/(50-59) 128/53 (08/03 0549) SpO2:  [96 %-98 %] 96 % (08/03 0549) Last BM Date: 05/01/19  Intake/Output from previous day: 08/02 0701 - 08/03 0700 In: 2395.2 [P.O.:20; I.V.:2375.2] Out: 1350 [Urine:550; Emesis/NG output:800] Intake/Output this shift: No intake/output data recorded.  PE: Gen:  Alert, NAD, pleasant Card:  Regular rate and rhythm, pedal pulses 2+ BL Pulm:  Normal effort, clear to auscultation bilaterally Abd: Soft, non-tender, non-distended, morbidly obese Skin: warm and dry, no rashes  Psych: A&Ox3   Lab Results:  Recent Labs    05/03/19 0638 05/04/19 0345  WBC 8.7 7.8  HGB 11.5* 11.6*  HCT 37.8 39.2  PLT 236 166   BMET Recent Labs    05/04/19 0345 05/05/19 0414  NA 140 141  K 3.4* 3.5  CL 113* 115*  CO2 23 21*  GLUCOSE 120* 158*  BUN 17 15  CREATININE 1.31* 1.14*  CALCIUM 8.2* 8.2*   PT/INR No results for input(s): LABPROT, INR in the last 72 hours. CMP     Component Value Date/Time   NA 141 05/05/2019 0414   K 3.5 05/05/2019 0414   CL 115 (H) 05/05/2019 0414   CO2 21 (L) 05/05/2019 0414   GLUCOSE 158 (H) 05/05/2019 0414   BUN 15 05/05/2019 0414   CREATININE 1.14 (H) 05/05/2019 0414   CREATININE 1.09 (H) 08/04/2015 1346   CALCIUM 8.2 (L) 05/05/2019 0414   PROT 4.5 (L) 05/04/2019 0345   PROT 6.5 03/17/2014 1348   ALBUMIN 2.1 (L) 05/04/2019  0345   AST 17 05/04/2019 0345   ALT 16 05/04/2019 0345   ALKPHOS 80 05/04/2019 0345   BILITOT 0.6 05/04/2019 0345   GFRNONAA 45 (L) 05/05/2019 0414   GFRAA 52 (L) 05/05/2019 0414   Lipase     Component Value Date/Time   LIPASE 30 05/01/2019 1524       Studies/Results: No results found.  Anti-infectives: Anti-infectives (From admission, onward)   None       Assessment/Plan HTN T2DM - diet controlled AKI - Cr 1.14 Hypothyroidism Hyperparathyroidism GERD OSA Obesity - BMI 36.94  Gastric Outlet Obstruction from narrowing at D1/D2 - s/p EGD 8/1 Dr. Rush Landmark - malignant appearing stricture at D1/D2 - biopsies pending  - NGT with minimal drainage  - check prealbumin today   FEN: NPO, IVF VTE: SCDs, SQ heparin ID: no current abx, afeb, no leukocytosis  LOS: 4 days    Brigid Re , [facility] 05/05/2019, 9:27 AM Pager: [phone] Consults: [phone] 7:00 AM - 4:30 PM M, W-F 7:00 AM - 11:30 AM Tues, Sat, Sun

## 2019-05-05 NOTE — Progress Notes (Signed)
Peripherally Inserted Central Catheter/Midline Placement  The IV Nurse has discussed with the patient and/or persons authorized to consent for the patient, the purpose of this procedure and the potential benefits and risks involved with this procedure.  The benefits include less needle sticks, lab draws from the catheter, and the patient may be discharged home with the catheter. Risks include, but not limited to, infection, bleeding, blood clot (thrombus formation), and puncture of an artery; nerve damage and irregular heartbeat and possibility to perform a PICC exchange if needed/ordered by physician.  Alternatives to this procedure were also discussed.  Bard Power PICC patient education guide, fact sheet on infection prevention and patient information card has been provided to patient /or left at bedside.    PICC/Midline Placement Documentation  PICC Double Lumen 98/92/11 PICC Left Cephalic 41 cm 1 cm (Active)  Indication for Insertion or Continuance of Line Administration of hyperosmolar/irritating solutions (i.e. TPN, Vancomycin, etc.) 05/05/19 1823  Exposed Catheter (cm) 1 cm 05/05/19 1823  Site Assessment Clean;Dry;Intact 05/05/19 1823  Lumen #1 Status Flushed;Saline locked;Blood return noted 05/05/19 1823  Lumen #2 Status Flushed;Saline locked;Blood return noted 05/05/19 1823  Dressing Type Transparent 05/05/19 1823  Dressing Status Clean;Dry;Intact 05/05/19 1823  Dressing Intervention New dressing 05/05/19 9417  Dressing Change Due 05/12/19 05/05/19 1823       Zyann Mabry, Nicolette Bang 05/05/2019, 6:24 PM

## 2019-05-05 NOTE — Progress Notes (Signed)
PHARMACY - ADULT TOTAL PARENTERAL NUTRITION CONSULT NOTE   Pharmacy Consult for TPN Indication:  Gastric outlet obstruction, duodenal stenosis/suspect malignant  HPI:  37 yoF admit 7/30 with N/V, NG tube placed, GI and surgery following.   TPN Access: Plan PICC  TPN start date: plan for 8/4    Patient Measurements: Estimated body mass index is 36.94 kg/m as calculated from the following:   Height as of this encounter: 5\' 3"  (1.6 m).   Weight as of this encounter: 208 lb 8.9 oz (94.6 kg).  Intake/Output:  Estimated Nutritional Needs - Per RD recommendations on (pending) kCal: Protein:  Fluid:   Current Nutrition: NPO, ice chips  IVF: D5NS at 125 ml/hr  Insulin Requirements:    Recent Labs    05/03/19 0638 05/04/19 0345 05/05/19 0414  NA 138 140 141  K 3.9 3.4* 3.5  CL 107 113* 115*  CO2 26 23 21*  GLUCOSE 137* 120* 158*  BUN 23 17 15   CREATININE 1.49* 1.31* 1.14*  CALCIUM 8.5* 8.2* 8.2*  MG 1.8 1.8  --   ALBUMIN 2.2* 2.1*  --   ALKPHOS 78 80  --   AST 18 17  --   ALT 16 16  --   BILITOT 0.3 0.6  --    ASSESSMENT                                                                                                           Glucose -   Electrolytes -   Renal -   LFTs -    TGs -   Prealbumin -    PLAN                                                                                                                         Today 8/3: Mag sulfate 2gm bolus, 4 runs KCl   At 1800 tomorrow 8/4:  Start custom TPN   TPN to contain standard multivitamins daily  Trace elements only M,W,F due to national back-order  Reduce IVF to ml/hr.  Begin/continue CBG q h with -scale SSI coverage  Plan to advance TPN as tolerated to the goal rate.  TPN lab panels on Mondays & Thursdays.  F/u daily.   Minda Ditto PharmD Pager [phone] 05/05/2019, 3:38 PM

## 2019-05-05 NOTE — Progress Notes (Signed)
Patient ID: [name], female   DOB: [birth year], 84 y.o.   MRN: [id number]    Progress Note   Subjective  CC; GOO- duodenal obstr Day # 4  Patient says she feels okay, has been nauseated, no vomiting, somewhat less abdominal discomfort with NG tube.  Duodenal Bx- Pend EGD 8/1 - clean based gastric ulcers,multp polyps, duodenal stenosis severe Suspect malignant  NG tube placed -500 cc plus out overnight    Objective   Vital signs in last 24 hours: Temp:  [98.3 F (36.8 C)-99.1 F (37.3 C)] 99 F (37.2 C) (08/03 0549) Pulse Rate:  [71-76] 76 (08/03 0549) Resp:  [16-20] 20 (08/03 0549) BP: (107-128)/(50-59) 128/53 (08/03 0549) SpO2:  [96 %-98 %] 96 % (08/03 0549) Last BM Date: 05/01/19 General: elderly    white female in NAD NG tube in Heart:  Regular rate and rhythm; no murmurs Lungs: Respirations even and unlabored, lungs CTA bilaterally Abdomen:  Soft, obese, mildly tender epigastrium and nondistended. Normal bowel sounds. Extremities:  Without edema. Neurologic:  Alert and oriented,  grossly normal neurologically. Psych:  Cooperative. Normal mood and affect.  Intake/Output from previous day: 08/02 0701 - 08/03 0700 In: 2395.2 [P.O.:20; I.V.:2375.2] Out: 1350 [Urine:550; Emesis/NG output:800] Intake/Output this shift: No intake/output data recorded.  Lab Results: Recent Labs    05/03/19 0638 05/04/19 0345  WBC 8.7 7.8  HGB 11.5* 11.6*  HCT 37.8 39.2  PLT 236 166   BMET Recent Labs    05/03/19 0638 05/04/19 0345 05/05/19 0414  NA 138 140 141  K 3.9 3.4* 3.5  CL 107 113* 115*  CO2 26 23 21*  GLUCOSE 137* 120* 158*  BUN 23 17 15   CREATININE 1.49* 1.31* 1.14*  CALCIUM 8.5* 8.2* 8.2*   LFT Recent Labs    05/04/19 0345  PROT 4.5*  ALBUMIN 2.1*  AST 17  ALT 16  ALKPHOS 80  BILITOT 0.6   PT/INR No results for input(s): LABPROT, INR in the last 72 hours.      Assessment / Plan:    #24 84 year old white female with 37-month history of  poor appetite, over 20 pound weight loss, upper abdominal discomfort and regular episodes of nausea and vomiting.  EGD  has revealed a malignant appearing duodenal stricture, with secondary outlet obstruction. Biopsies are pending-I spoke with pathology this morning and asked to rush results  Continue IV PPI twice daily Continue NG Surgery is following with plan for bypass versus resection  #2 nutrition-Patient is already had a significant weight loss, and has not had any significant p.o. intake over the past week. TPN needs to be started, will defer to hospitalist today  #3 acute kidney injury-resolved        Principal Problem:   Nausea and/or vomiting Active Problems:   ANXIETY DEPRESSION   Essential hypertension   Hyperparathyroidism, primary (HCC)   OSA (obstructive sleep apnea)   Hypothyroid   Abnormal CT scan, stomach   Abnormal CT scan, sigmoid colon     LOS: 4 days      05/05/2019, 8:45 AM

## 2019-05-05 NOTE — Progress Notes (Signed)
Report called to ashley and christy pt to transfer to [place]

## 2019-05-05 NOTE — Plan of Care (Signed)
  Problem: Education: Goal: Knowledge of General Education information will improve Description: Including pain rating scale, medication(s)/side effects and non-pharmacologic comfort measures Outcome: Progressing   Problem: Clinical Measurements: Goal: Will remain free from infection Outcome: Progressing Goal: Respiratory complications will improve Outcome: Progressing Goal: Cardiovascular complication will be avoided Outcome: Progressing   Problem: Activity: Goal: Risk for activity intolerance will decrease Outcome: Progressing   

## 2019-05-05 NOTE — Telephone Encounter (Signed)
Window opened in error

## 2019-05-05 NOTE — Progress Notes (Signed)
Patient's husband Geraldene Eisel would like MD to call him in the morning  8/4 when he rounds on his wife so that he can hear pathology results. [phone].

## 2019-05-05 NOTE — Care Management Important Message (Signed)
Important Message  Patient Details IM Letter given to Kathrin Greathouse SW to present to the Patient Name: [name] MRN: [id number] Date of Birth: 1935-01-18   Medicare Important Message Given:  Yes     Kerin Salen 05/05/2019, 1:44 PM

## 2019-05-05 NOTE — Progress Notes (Signed)
Spoke with pharmacy, TPN will not be made until 8-4.  PICC will be inserted prior to start time but may not be done today 8-3.  Carolee Rota, RN VAST

## 2019-05-06 ENCOUNTER — Encounter (HOSPITAL_COMMUNITY)
Admission: EM | Disposition: A | Discharge status: Home Health | Payer: Self-pay | Source: Ambulatory Visit | Attending: Student

## 2019-05-06 ENCOUNTER — Inpatient Hospital Stay (HOSPITAL_COMMUNITY): Payer: Medicare Other | Admitting: Anesthesiology

## 2019-05-06 ENCOUNTER — Encounter (HOSPITAL_COMMUNITY): Payer: Self-pay | Admitting: *Deleted

## 2019-05-06 DIAGNOSIS — E440 Moderate protein-calorie malnutrition: Secondary | ICD-10-CM

## 2019-05-06 HISTORY — PX: GASTROJEJUNOSTOMY: SHX1697

## 2019-05-06 LAB — COMPREHENSIVE METABOLIC PANEL
ALT: 18 U/L (ref 0–44)
AST: 20 U/L (ref 15–41)
Albumin: 2.1 g/dL — ABNORMAL LOW (ref 3.5–5.0)
Alkaline Phosphatase: 94 U/L (ref 38–126)
Anion gap: 4 — ABNORMAL LOW (ref 5–15)
BUN: 12 mg/dL (ref 8–23)
CO2: 21 mmol/L — ABNORMAL LOW (ref 22–32)
Calcium: 8.7 mg/dL — ABNORMAL LOW (ref 8.9–10.3)
Chloride: 116 mmol/L — ABNORMAL HIGH (ref 98–111)
Creatinine, Ser: 1.1 mg/dL — ABNORMAL HIGH (ref 0.44–1.00)
GFR calc Af Amer: 55 mL/min — ABNORMAL LOW (ref >60)
GFR calc non Af Amer: 47 mL/min — ABNORMAL LOW (ref >60)
Glucose, Bld: 133 mg/dL — ABNORMAL HIGH (ref 70–99)
Potassium: 3.6 mmol/L (ref 3.5–5.1)
Sodium: 141 mmol/L (ref 135–145)
Total Bilirubin: 0.3 mg/dL (ref 0.3–1.2)
Total Protein: 4.8 g/dL — ABNORMAL LOW (ref 6.5–8.1)

## 2019-05-06 LAB — DIFFERENTIAL
Abs Immature Granulocytes: 0.01 10*3/uL (ref 0.00–0.07)
Basophils Absolute: 0.1 10*3/uL (ref 0.0–0.1)
Basophils Relative: 1 %
Eosinophils Absolute: 0.3 10*3/uL (ref 0.0–0.5)
Eosinophils Relative: 5 %
Immature Granulocytes: 0 %
Lymphocytes Relative: 15 %
Lymphs Abs: 1.1 10*3/uL (ref 0.7–4.0)
Monocytes Absolute: 0.9 10*3/uL (ref 0.1–1.0)
Monocytes Relative: 12 %
Neutro Abs: 5 10*3/uL (ref 1.7–7.7)
Neutrophils Relative %: 67 %

## 2019-05-06 LAB — CBC
HCT: 39.5 % (ref 36.0–46.0)
Hemoglobin: 11.8 g/dL — ABNORMAL LOW (ref 12.0–15.0)
MCH: 28.9 pg (ref 26.0–34.0)
MCHC: 29.9 g/dL — ABNORMAL LOW (ref 30.0–36.0)
MCV: 96.8 fL (ref 80.0–100.0)
Platelets: 254 10*3/uL (ref 150–400)
RBC: 4.08 MIL/uL (ref 3.87–5.11)
RDW: 14.7 % (ref 11.5–15.5)
WBC: 7.3 10*3/uL (ref 4.0–10.5)
nRBC: 0 % (ref 0.0–0.2)

## 2019-05-06 LAB — PHOSPHORUS: Phosphorus: 1.5 mg/dL — ABNORMAL LOW (ref 2.5–4.6)

## 2019-05-06 LAB — GLUCOSE, CAPILLARY
Glucose-Capillary: 160 mg/dL — ABNORMAL HIGH (ref 70–99)
Glucose-Capillary: 138 mg/dL — ABNORMAL HIGH (ref 70–99)

## 2019-05-06 LAB — MAGNESIUM: Magnesium: 2 mg/dL (ref 1.7–2.4)

## 2019-05-06 LAB — TRIGLYCERIDES: Triglycerides: 116 mg/dL (ref <150)

## 2019-05-06 LAB — PREALBUMIN: Prealbumin: <5 mg/dL — ABNORMAL LOW (ref 18–38)

## 2019-05-06 SURGERY — GASTROJEJUNOSTOMY, LAPAROSCOPIC
Anesthesia: General | Site: Abdomen

## 2019-05-06 MED ORDER — SUGAMMADEX SODIUM 200 MG/2ML IV SOLN
INTRAVENOUS | Status: DC | PRN
  Administered 2019-05-06: 400 mg via INTRAVENOUS

## 2019-05-06 MED ORDER — PROPOFOL 10 MG/ML IV BOLUS
INTRAVENOUS | Status: DC | PRN
  Administered 2019-05-06: 150 mg via INTRAVENOUS

## 2019-05-06 MED ORDER — ERYTHROMYCIN ETHYLSUCCINATE 400 MG/5ML PO SUSR
400.0000 mg | Freq: Three times a day (TID) | ORAL | Status: DC
  Filled 2019-05-06 (×2): qty 5

## 2019-05-06 MED ORDER — JEVITY 1.2 CAL PO LIQD
1000.0000 mL | ORAL | Status: DC
  Filled 2019-05-06: qty 1000

## 2019-05-06 MED ORDER — SUCCINYLCHOLINE CHLORIDE 200 MG/10ML IV SOSY
PREFILLED_SYRINGE | INTRAVENOUS | Status: AC
  Filled 2019-05-06: qty 10

## 2019-05-06 MED ORDER — EPHEDRINE 5 MG/ML INJ
INTRAVENOUS | Status: AC
  Filled 2019-05-06: qty 10

## 2019-05-06 MED ORDER — SODIUM CHLORIDE 0.9 % IV SOLN
2.0000 g | INTRAVENOUS | Status: AC
  Administered 2019-05-06: 11:00:00 2 g via INTRAVENOUS
  Filled 2019-05-06 (×2): qty 2

## 2019-05-06 MED ORDER — ONDANSETRON HCL 4 MG/2ML IJ SOLN
INTRAMUSCULAR | Status: DC | PRN
  Administered 2019-05-06: 4 mg via INTRAVENOUS

## 2019-05-06 MED ORDER — PROPOFOL 10 MG/ML IV BOLUS
INTRAVENOUS | Status: AC
  Filled 2019-05-06: qty 20

## 2019-05-06 MED ORDER — LACTATED RINGERS IV SOLN
INTRAVENOUS | Status: DC
  Administered 2019-05-06 (×3): via INTRAVENOUS

## 2019-05-06 MED ORDER — ACETAMINOPHEN 10 MG/ML IV SOLN: 1000.0000 mg | Freq: Once | INTRAVENOUS | Status: DC | PRN

## 2019-05-06 MED ORDER — SODIUM CHLORIDE 0.9 % IR SOLN
Status: DC | PRN
  Administered 2019-05-06: 1000 mL

## 2019-05-06 MED ORDER — INSULIN ASPART 100 UNIT/ML ~~LOC~~ SOLN
0.0000 [IU] | Freq: Four times a day (QID) | SUBCUTANEOUS | Status: DC
  Administered 2019-05-06: 1 [IU] via SUBCUTANEOUS

## 2019-05-06 MED ORDER — PHENYLEPHRINE 40 MCG/ML (10ML) SYRINGE FOR IV PUSH (FOR BLOOD PRESSURE SUPPORT)
PREFILLED_SYRINGE | INTRAVENOUS | Status: DC | PRN
  Administered 2019-05-06: 80 ug via INTRAVENOUS
  Administered 2019-05-06 (×2): 120 ug via INTRAVENOUS

## 2019-05-06 MED ORDER — HYDROMORPHONE HCL 1 MG/ML IJ SOLN: 0.2500 mg | INTRAMUSCULAR | Status: DC | PRN

## 2019-05-06 MED ORDER — SUFENTANIL CITRATE 50 MCG/ML IV SOLN
INTRAVENOUS | Status: AC
  Filled 2019-05-06: qty 1

## 2019-05-06 MED ORDER — BUPIVACAINE LIPOSOME 1.3 % IJ SUSP
20.0000 mL | Freq: Once | INTRAMUSCULAR | Status: DC
  Filled 2019-05-06: qty 20

## 2019-05-06 MED ORDER — RINGERS IRRIGATION IR SOLN
Status: DC | PRN
  Administered 2019-05-06: 1000 mL

## 2019-05-06 MED ORDER — ERYTHROMYCIN ETHYLSUCCINATE 200 MG/5ML PO SUSR
400.0000 mg | Freq: Three times a day (TID) | ORAL | Status: DC
  Administered 2019-05-07 – 2019-05-08 (×4): 400 mg via ORAL
  Filled 2019-05-06 (×7): qty 10

## 2019-05-06 MED ORDER — BUPIVACAINE-EPINEPHRINE 0.25% -1:200000 IJ SOLN
INTRAMUSCULAR | Status: DC | PRN
  Administered 2019-05-06: 60 mL

## 2019-05-06 MED ORDER — ALBUMIN HUMAN 5 % IV SOLN
INTRAVENOUS | Status: AC
  Filled 2019-05-06: qty 250

## 2019-05-06 MED ORDER — ONDANSETRON HCL 4 MG/2ML IJ SOLN
INTRAMUSCULAR | Status: AC
  Filled 2019-05-06: qty 2

## 2019-05-06 MED ORDER — SUFENTANIL CITRATE 50 MCG/ML IV SOLN
INTRAVENOUS | Status: DC | PRN
  Administered 2019-05-06: 5 ug via INTRAVENOUS
  Administered 2019-05-06: 10 ug via INTRAVENOUS
  Administered 2019-05-06: 15 ug via INTRAVENOUS
  Administered 2019-05-06 (×3): 5 ug via INTRAVENOUS

## 2019-05-06 MED ORDER — LIDOCAINE 2% (20 MG/ML) 5 ML SYRINGE
INTRAMUSCULAR | Status: DC | PRN
  Administered 2019-05-06: 1 mg/kg/h via INTRAVENOUS

## 2019-05-06 MED ORDER — SUCCINYLCHOLINE CHLORIDE 200 MG/10ML IV SOSY
PREFILLED_SYRINGE | INTRAVENOUS | Status: DC | PRN
  Administered 2019-05-06: 740 mg via INTRAVENOUS

## 2019-05-06 MED ORDER — LIDOCAINE 2% (20 MG/ML) 5 ML SYRINGE
INTRAMUSCULAR | Status: AC
  Filled 2019-05-06: qty 5

## 2019-05-06 MED ORDER — DEXAMETHASONE SODIUM PHOSPHATE 10 MG/ML IJ SOLN
INTRAMUSCULAR | Status: AC
  Filled 2019-05-06: qty 1

## 2019-05-06 MED ORDER — POTASSIUM PHOSPHATES 15 MMOLE/5ML IV SOLN
20.0000 mmol | Freq: Once | INTRAVENOUS | Status: AC
  Administered 2019-05-06: 20 mmol via INTRAVENOUS
  Filled 2019-05-06: qty 6.67

## 2019-05-06 MED ORDER — BUPIVACAINE LIPOSOME 1.3 % IJ SUSP
INTRAMUSCULAR | Status: DC | PRN
  Administered 2019-05-06: 20 mL

## 2019-05-06 MED ORDER — ROCURONIUM BROMIDE 10 MG/ML (PF) SYRINGE
PREFILLED_SYRINGE | INTRAVENOUS | Status: DC | PRN
  Administered 2019-05-06: 50 mg via INTRAVENOUS
  Administered 2019-05-06 (×2): 10 mg via INTRAVENOUS

## 2019-05-06 MED ORDER — OSMOLITE 1.5 CAL PO LIQD
1000.0000 mL | ORAL | Status: DC
  Administered 2019-05-06: 19:00:00 20 mL
  Administered 2019-05-07: 1000 mL
  Filled 2019-05-06 (×2): qty 1000

## 2019-05-06 MED ORDER — LIDOCAINE 2% (20 MG/ML) 5 ML SYRINGE
INTRAMUSCULAR | Status: DC | PRN
  Administered 2019-05-06: 100 mg via INTRAVENOUS

## 2019-05-06 MED ORDER — EPHEDRINE SULFATE-NACL 50-0.9 MG/10ML-% IV SOSY
PREFILLED_SYRINGE | INTRAVENOUS | Status: DC | PRN
  Administered 2019-05-06: 10 mg via INTRAVENOUS
  Administered 2019-05-06: 5 mg via INTRAVENOUS

## 2019-05-06 MED ORDER — SUGAMMADEX SODIUM 500 MG/5ML IV SOLN
INTRAVENOUS | Status: AC
  Filled 2019-05-06: qty 5

## 2019-05-06 MED ORDER — TRAVASOL 10 % IV SOLN
INTRAVENOUS | Status: DC
  Filled 2019-05-06: qty 480

## 2019-05-06 MED ORDER — SODIUM CHLORIDE (PF) 0.9 % IJ SOLN
INTRAMUSCULAR | Status: AC
  Filled 2019-05-06: qty 10

## 2019-05-06 MED ORDER — BUPIVACAINE-EPINEPHRINE (PF) 0.25% -1:200000 IJ SOLN
INTRAMUSCULAR | Status: AC
  Filled 2019-05-06: qty 60

## 2019-05-06 MED ORDER — ROCURONIUM BROMIDE 10 MG/ML (PF) SYRINGE
PREFILLED_SYRINGE | INTRAVENOUS | Status: AC
  Filled 2019-05-06: qty 10

## 2019-05-06 MED ORDER — KETAMINE HCL 10 MG/ML IJ SOLN
INTRAMUSCULAR | Status: DC | PRN
  Administered 2019-05-06: 30 mg via INTRAVENOUS

## 2019-05-06 MED ORDER — SODIUM CHLORIDE 0.9 % IV SOLN
2.0000 g | Freq: Two times a day (BID) | INTRAVENOUS | Status: AC
  Administered 2019-05-06: 2 g via INTRAVENOUS
  Filled 2019-05-06: qty 2

## 2019-05-06 MED ORDER — ALBUMIN HUMAN 5 % IV SOLN
INTRAVENOUS | Status: DC | PRN
  Administered 2019-05-06: 11:00:00 via INTRAVENOUS

## 2019-05-06 MED ORDER — ACETAMINOPHEN 650 MG RE SUPP: 650.0000 mg | Freq: Four times a day (QID) | RECTAL | Status: DC | PRN

## 2019-05-06 MED ORDER — LIP MEDEX EX OINT
1.0000 "application " | TOPICAL_OINTMENT | Freq: Two times a day (BID) | CUTANEOUS | Status: DC
  Administered 2019-05-06 – 2019-05-19 (×24): 1 via TOPICAL
  Filled 2019-05-06 (×3): qty 7

## 2019-05-06 MED ORDER — METHOCARBAMOL 1000 MG/10ML IJ SOLN
1000.0000 mg | Freq: Four times a day (QID) | INTRAVENOUS | Status: DC | PRN
  Administered 2019-05-11 – 2019-05-14 (×2): 1000 mg via INTRAVENOUS
  Filled 2019-05-06 (×3): qty 10

## 2019-05-06 MED ORDER — DEXAMETHASONE SODIUM PHOSPHATE 10 MG/ML IJ SOLN
INTRAMUSCULAR | Status: DC | PRN
  Administered 2019-05-06: 10 mg via INTRAVENOUS

## 2019-05-06 MED ORDER — PROMETHAZINE HCL 25 MG/ML IJ SOLN: 6.2500 mg | INTRAMUSCULAR | Status: DC | PRN

## 2019-05-06 MED ORDER — MEPERIDINE HCL 50 MG/ML IJ SOLN: 6.2500 mg | INTRAMUSCULAR | Status: DC | PRN

## 2019-05-06 MED ORDER — PHENYLEPHRINE 40 MCG/ML (10ML) SYRINGE FOR IV PUSH (FOR BLOOD PRESSURE SUPPORT)
PREFILLED_SYRINGE | INTRAVENOUS | Status: AC
  Filled 2019-05-06: qty 10

## 2019-05-06 MED ORDER — DEXTROSE-NACL 5-0.9 % IV SOLN
INTRAVENOUS | Status: DC
  Administered 2019-05-07 – 2019-05-15 (×10): via INTRAVENOUS

## 2019-05-06 MED ORDER — MAGIC MOUTHWASH
15.0000 mL | Freq: Four times a day (QID) | ORAL | Status: DC | PRN
  Filled 2019-05-06: qty 15

## 2019-05-06 SURGICAL SUPPLY — 62 items
APPLIER CLIP 5 13 M/L LIGAMAX5 (MISCELLANEOUS)
APPLIER CLIP ROT 10 11.4 M/L (STAPLE)
CABLE HIGH FREQUENCY MONO STRZ (ELECTRODE) ×3 IMPLANT
CATH GJ 30FR (CATHETERS) ×3 IMPLANT
CHLORAPREP W/TINT 26 (MISCELLANEOUS) ×3 IMPLANT
CLIP APPLIE 5 13 M/L LIGAMAX5 (MISCELLANEOUS) IMPLANT
CLIP APPLIE ROT 10 11.4 M/L (STAPLE) IMPLANT
COVER SURGICAL LIGHT HANDLE (MISCELLANEOUS) ×3 IMPLANT
COVER WAND RF STERILE (DRAPES) IMPLANT
DECANTER SPIKE VIAL GLASS SM (MISCELLANEOUS) ×3 IMPLANT
DEVICE TROCAR PUNCTURE CLOSURE (ENDOMECHANICALS) ×3 IMPLANT
DRAIN CHANNEL 19F RND (DRAIN) IMPLANT
DRAIN PENROSE 18X1/2 LTX STRL (DRAIN) IMPLANT
DRAPE WARM FLUID 44X44 (DRAPES) ×3 IMPLANT
DRSG TEGADERM 2-3/8X2-3/4 SM (GAUZE/BANDAGES/DRESSINGS) ×9 IMPLANT
ELECT REM PT RETURN 15FT ADLT (MISCELLANEOUS) ×3 IMPLANT
EVACUATOR SILICONE 100CC (DRAIN) IMPLANT
FELT TEFLON 4 X1 (Mesh General) ×3 IMPLANT
GLOVE ECLIPSE 8.0 STRL XLNG CF (GLOVE) ×3 IMPLANT
GLOVE INDICATOR 8.0 STRL GRN (GLOVE) ×3 IMPLANT
GOWN STRL REUS W/TWL XL LVL3 (GOWN DISPOSABLE) ×6 IMPLANT
IRRIG SUCT STRYKERFLOW 2 WTIP (MISCELLANEOUS) ×3
IRRIGATION SUCT STRKRFLW 2 WTP (MISCELLANEOUS) ×1 IMPLANT
IV CATH 14GX2 1/4 (CATHETERS) ×3 IMPLANT
KIT BASIN OR (CUSTOM PROCEDURE TRAY) ×3 IMPLANT
KIT INTRODUCER MIC G-18 (SET/KITS/TRAYS/PACK) ×3 IMPLANT
KIT TURNOVER KIT A (KITS) IMPLANT
LEGGING LITHOTOMY PAIR STRL (DRAPES) ×3 IMPLANT
MARKER SKIN DUAL TIP RULER LAB (MISCELLANEOUS) ×6 IMPLANT
PAD POSITIONING PINK XL (MISCELLANEOUS) ×3 IMPLANT
PROTECTOR NERVE ULNAR (MISCELLANEOUS) IMPLANT
RELOAD STAPLER BLUE 60MM (STAPLE) ×2 IMPLANT
SCISSORS LAP 5X35 DISP (ENDOMECHANICALS) ×3 IMPLANT
SET TUBE SMOKE EVAC HIGH FLOW (TUBING) ×3 IMPLANT
SHEARS HARMONIC ACE PLUS 36CM (ENDOMECHANICALS) ×3 IMPLANT
SLEEVE XCEL OPT CAN 5 100 (ENDOMECHANICALS) ×6 IMPLANT
SPONGE DRAIN TRACH 4X4 STRL 2S (GAUZE/BANDAGES/DRESSINGS) ×3 IMPLANT
STAPLER ECHELON LONG 60 440 (INSTRUMENTS) ×3 IMPLANT
STAPLER RELOAD BLUE 60MM (STAPLE) ×6
STAPLER VISISTAT 35W (STAPLE) IMPLANT
SUT ETHIBOND 0 (SUTURE) IMPLANT
SUT ETHIBOND 0 36 GRN (SUTURE) IMPLANT
SUT ETHIBOND NAB CT1 #1 30IN (SUTURE) ×6 IMPLANT
SUT MNCRL AB 4-0 PS2 18 (SUTURE) ×3 IMPLANT
SUT PROLENE 0 CT 2 (SUTURE) ×12 IMPLANT
SUT PROLENE 2 0 CT2 30 (SUTURE) ×9 IMPLANT
SUT PROLENE 2 0 SH DA (SUTURE) IMPLANT
SUT VLOC 180 3-0 9IN GS21 (SUTURE) ×3 IMPLANT
SUT VLOC BARB 180 ABS3/0GR12 (SUTURE) ×9
SUTURE VLOC BRB 180 ABS3/0GR12 (SUTURE) ×3 IMPLANT
TAPE CLOTH 4X10 WHT NS (GAUZE/BANDAGES/DRESSINGS) IMPLANT
TAPE CLOTH SURG 4X10 WHT LF (GAUZE/BANDAGES/DRESSINGS) ×3 IMPLANT
TIP INNERVISION DETACH 40FR (MISCELLANEOUS) IMPLANT
TIP INNERVISION DETACH 50FR (MISCELLANEOUS) IMPLANT
TIP INNERVISION DETACH 56FR (MISCELLANEOUS) IMPLANT
TIPS INNERVISION DETACH 40FR (MISCELLANEOUS)
TOWEL OR 17X26 10 PK STRL BLUE (TOWEL DISPOSABLE) ×3 IMPLANT
TOWEL OR NON WOVEN STRL DISP B (DISPOSABLE) ×3 IMPLANT
TRAY FOLEY MTR SLVR 16FR STAT (SET/KITS/TRAYS/PACK) IMPLANT
TRAY LAPAROSCOPIC (CUSTOM PROCEDURE TRAY) ×3 IMPLANT
TROCAR BLADELESS OPT 5 100 (ENDOMECHANICALS) ×3 IMPLANT
TROCAR XCEL NON-BLD 11X100MML (ENDOMECHANICALS) ×3 IMPLANT

## 2019-05-06 NOTE — Anesthesia Procedure Notes (Signed)
Procedure Name: Intubation Date/Time: 05/06/2019 10:45 AM Performed by: Sharlette Dense, CRNA Patient Re-evaluated:Patient Re-evaluated prior to induction Oxygen Delivery Method: Circle system utilized Preoxygenation: Pre-oxygenation with 100% oxygen Induction Type: IV induction and Rapid sequence Laryngoscope Size: Miller and 2 Grade View: Grade I Tube type: Oral Tube size: 7.5 mm Number of attempts: 2 Airway Equipment and Method: Stylet Placement Confirmation: ETT inserted through vocal cords under direct vision,  positive ETCO2 and breath sounds checked- equal and bilateral Secured at: 21 cm Tube secured with: Tape Dental Injury: Teeth and Oropharynx as per pre-operative assessment

## 2019-05-06 NOTE — Transfer of Care (Signed)
Immediate Anesthesia Transfer of Care Note  Patient: TATUM CORL  Procedure(s) Performed: LAPAROSCOPIC GASTROJEJUNOSTOMY and feedling gastrojejunostomy tube (N/A Abdomen)  Patient Location: PACU  Anesthesia Type:General  Level of Consciousness: drowsy  Airway & Oxygen Therapy: Patient Spontanous Breathing and Patient connected to face mask oxygen  Post-op Assessment: Report given to RN and Post -op Vital signs reviewed and stable  Post vital signs: Reviewed and stable  Last Vitals:  Vitals Value Taken Time  BP    Temp    Pulse 89 05/06/19 1428  Resp 14 05/06/19 1428  SpO2 100 % 05/06/19 1428  Vitals shown include unvalidated device data.  Last Pain:  Vitals:   05/06/19 0912  TempSrc: Oral  PainSc: 0-No pain         Complications: No apparent anesthesia complications

## 2019-05-06 NOTE — Op Note (Signed)
05/06/2019 ° °2:21 PM ° °PATIENT:  [name]  83 y.o. female ° °Patient Care Team: °Aronson, Richard, MD as PCP - General (Internal Medicine) °Cohen, Max W, MD as Consulting Physician (Orthopedic Surgery) °Marlowe, Bethany, RN as Registered Nurse ° °PRE-OPERATIVE DIAGNOSIS:  gastric outlet obstruction ° °POST-OPERATIVE DIAGNOSIS:  Gastric outlet obstruction  ° °PROCEDURE:   °LAPAROSCOPIC GASTROJEJUNOSTOMY  °LAPAROSCOPIC FEEDING GASTROJEJUNOSTOMY TUBE ° °SURGEON:  Steven C. Gross, MD ° °ASSISTANT: RN  ° °ANESTHESIA:   local and general ° °EBL:  Total I/O °In: 1250 [I.V.:1000; IV Piggyback:250] °Out: 150 [Urine:100; Blood:50] ° °Delay start of Pharmacological VTE agent (>24hrs) due to surgical blood loss or risk of bleeding:  no ° °DRAINS: GJ Tube  ° °SPECIMEN:  No Specimen ° °DISPOSITION OF SPECIMEN:  N/A ° °COUNTS:  YES ° °PLAN OF CARE: Admit to inpatient  ° °PATIENT DISPOSITION:  PACU - hemodynamically stable. ° °IINDICATION: Pleasant patient with proximal digestive tract obstruction most likely due to benign stricture of duodenum.  Unintentional weight loss nausea vomiting NG tube.  Endoscopy showed complete stricture.  Biopsies benign for cancer.  CT scan shows no obvious adenopathy or evidence of carcinoma.  Recommendation made for gastrojejunostomy to bypass the stricture and feeding gastrojejunostomy given the preoperative gastric outlet obstruction and anticipated prolonged gastric ileus  ° ° Discussed with the patient: The anatomy and physiology of the digestive tract was explained. The need of nutrition to help in patient recovery and survival was discussed Technique of placement of feeding tube through endoscopic, laparoscopic and open techniques were discussed.  °Risks such as bleeding, infection, stroke, heart attack, and death were discussed. Risks of injury to other organs such as intestines were discussed. Long-term issues of catheter occlusion, leak, skin irritation, falling out, need for  replacement, and others were discussed. I noted a good likelihood this will help address the problem. Questions answered the patient agrees to proceed  ° °OR FINDINGS: ° °Patient had very dilated stomach.  And transition point near the D1/D2 region at prior cholecystectomy.  Some inflammation around there.  Held off on any major resection given her malnutrition.  No evidence of cancer or carcinomatosis and peritoneal cavity. ° °Patient has a side-to-side stapled retrogastric antecolic isoperistaltic gastrojejunostomy along the posterior wall of the antrum ° °30 French MIC-Key feeding gastrojejunostomy tube in the left upper quadrant. Balloon filled with air there  ° °DESCRIPTION:  °Informed consent was confirmed. The patient underwent general anaesthesia without difficulty. The patient was positioned appropriately. VTE prevention in place. The patient's abdomen was clipped, prepped, & draped in a sterile fashion. Surgical timeout confirmed our plan.  ° °The patient was positioned in reverse Trendelenburg. Abdominal entry with a 5mm laparoscopic port was gained using optical entry technique in the right upper abdomen. Entry was clean. I induced carbon dioxide insufflation. Camera inspection revealed no injury. Extra ports were carefully placed under direct laparoscopic visualization.  ° °Diagnostic laparoscopy revealed no frank metastatic disease.  I can see very dilated stomach going up to the right upper quadrant of the size of prior cholecystectomy.  I could see some inflamed duodenal bulb.  Some rather dense adhesions there.  Inside given her morbid EC and malnutrition to hold off on any major resection today and palliate the obstruction that was biopsied benign.  I could find the stomach. I could reach the greater curvature to come up into the left upper abdomen.  I could find the colon in the small intestine and ligament of Treitz.    I freed the greater omentum off the transverse colon and rolled it cephalad to  expose the posterior gastric wall.  I could find ligament of Treitz and run the jejunum more distally.  Brought to the limb to lay along the posterior gastric wall.  I created a posterior wall gastrotomy and antimesenteric jejunotomy.  I did a side-to-side stapled gastrojejunostomy anastomosis using 2 firings of a 60 mm blue load laparoscopic stapler.  I laid the bowel such and when at the proximal jejunum was in the body of the stomach in the more distal jejunum was more towards the antrum so that it was an isoperistaltic anastomosis.  One good and two thirds of another firing.  I closed the common staple opening which was at the proximal end of the anastomosis with 3-0 V-Lock suture from each corner in a running [place] fashion to get good imbrication and avoid narrowing at the gastrojejunostomy..  Then focused on the gastrojejunostomy tube.  I placed a 2-0 PDS pursestring suture along the greater curvature of the distal body of the stomach on the anterior wall.  I then placed 2-0 Prolene sutures on the anterior gastric wall and pulled a month of the anterior abdominal wall using a laparoscopic suture passer.  Did interrupted sutures x4 in an open pentagonal fashion.  I used that to pull the stomach up such there was a nice flat region abutting the anterior, wall in the left upper quadrant.  I made a 1 cm opening in the center of this through the gastric wall and plastic Angiocath.  Passed a wire into the stomach and then passed the dilator through the kit into the stomach.  Try to get the wire to pass into the gastrojejunostomy into the jejunum distal to it.  But it would not pass.  Tried numerous remove rings.  Eventually ended up passing the gastrojejunostomy tube with the help of the peel-away sheath into the stomach.  I created a gastrotomy along the lesser curvature the stomach.  I found the white distal jejunal tubing with the black soft tip.  There laparoscopically was able to feed the tube directly  across the gastrojejunostomy into the jejunum 30 cm more distally to good result.  I closed this gastrotomy using 3-0 VeLock suture starting at each corner with a running [place] fashion to good result.  Did irrigation and inspection.  Hemostasis is good.  Anastomosis was open and patent and viable.  I wrapped the 2-0 PDS suture around the gastric jejunostomy tube internally and tied that down for internal pursestring and suturing.  Gastric balloon pulled up and pushed flush.  Gastric port and jejunal ports flushed well.  No leaking on the stomach or skin side.  Desufflated and tied down the Prolene sutures that were helping tacked the stomach up.  Did reinspection saw no abnormalities and a good tight seal.  I aspirated carbon oxide and removed ports.  Stapler port site was along the left soft and costal ridge and closed with 0 Vicryl rapid suture.  Tap blocks have been done in both sides.  The G-tube was secured with 0 Prolene suture to the skin x 3.  Tails brought up through each of the 6 holes in the flange and tied down as well as tied around the tube for 3 interrupted sutures helping hold the flange down and wrapping around the tube to avoid further pulling.  Port sites closed with Monocryl.  Sterile dressing applied.  Patient being extubated go to the recovery room.  Patient extubated recovery room.  We will start jejunal tube feeds now.  Plan to remove the nasogastric tube in the morning.  Keep the gastric tube to gravity for now.  I discussed operative findings, updated the patient's status, discussed probable steps to recovery, and gave postoperative recommendations to the patient's spouse.  Recommendations were made.  Questions were answered.  He expressed understanding & appreciation. ° °Steven C. Gross, MD, FACS, MASCRS °Gastrointestinal and Minimally Invasive Surgery ° ° ° °[address], [address] °[place], [place] [address] °[phone] Main / Paging °[phone] Fax ° ° °

## 2019-05-06 NOTE — Progress Notes (Signed)
PROGRESS NOTE  [name] WYO:[id number] DOB: September 30, 1935   PCP: Burnard Bunting, MD  Patient is from: Home  DOA: 05/01/2019 LOS: 5  Brief Narrative / Interim history: 84 year old female with history of DM-2, GERD, HTN, morbid obesity, hyperlipidemia, diverticulosis, hyperparathyroidism, anxiety disorder and bronchiectasis; admitted on 05/01/2019, presented with complaint of nausea and vomiting, was found to have gastric outlet obstruction concerning for malignancy. Had EGD by gastroenterology on 05/03/2019 revealed a esophagitis, nonbleeding erosive gastropathy, gastric ulcer with a clean ulcer base, multiple gastric polyps, acquired duodenal stenosis and mucosal change in the duodenum.  Biopsy suggested scarring from chronic inflammatory changes but negative for malignancy or dysplasia.  Subjective: No major events overnight of this morning.  She denies abdominal pain, nausea or vomiting.  NG tube in place.  Relieved to hear about biopsy result.    Objective: Vitals:   05/05/19 1445 05/05/19 2115 05/06/19 0535 05/06/19 0912  BP:  (!) 121/51 136/62 (!) 143/38  Pulse:  72 73 68  Resp:  15 16 20   Temp:  98.6 F (37 C) 98.1 F (36.7 C) 98.4 F (36.9 C)  TempSrc:  Oral Oral Oral  SpO2:  90% 95% 95%  Weight: 99.6 kg     Height:        Intake/Output Summary (Last 24 hours) at 05/06/2019 1334 Last data filed at 05/06/2019 1254 Gross per 24 hour  Intake 4158.62 ml  Output 750 ml  Net 3408.62 ml   Filed Weights   05/01/19 2238 05/05/19 1445  Weight: 94.6 kg 99.6 kg    Examination:  GENERAL: No acute distress.  Appears well.  HEENT: MMM.  Vision and hearing grossly intact.  NECK: Supple.  No apparent JVD.  RESP:  No IWOB. Good air movement bilaterally. CVS:  RRR. Heart sounds normal.  ABD/GI/GU: Bowel sounds present. Soft.  Some tenderness to palpation over LLQ. MSK/EXT:  Moves extremities. No apparent deformity or edema.  SKIN: no apparent skin lesion or wound NEURO:  Awake, alert and oriented appropriately.  No gross deficit.  PSYCH: Calm. Normal affect.    I have personally reviewed the following labs and images:  Radiology Studies: No results found.  Microbiology: Recent Results (from the past 240 hour(s))  SARS Coronavirus 2 (CEPHEID - Performed in [place] hospital lab), Hosp Order     Status: None   Collection Time: 05/01/19  3:46 PM   Specimen: Nasopharyngeal Swab  Result Value Ref Range Status   SARS Coronavirus 2 NEGATIVE NEGATIVE Final    Comment: (NOTE) If result is NEGATIVE SARS-CoV-2 target nucleic acids are NOT DETECTED. The SARS-CoV-2 RNA is generally detectable in upper and lower  respiratory specimens during the acute phase of infection. The lowest  concentration of SARS-CoV-2 viral copies this assay can detect is 250  copies / mL. A negative result does not preclude SARS-CoV-2 infection  and should not be used as the sole basis for treatment or other  patient management decisions.  A negative result may occur with  improper specimen collection / handling, submission of specimen other  than nasopharyngeal swab, presence of viral mutation(s) within the  areas targeted by this assay, and inadequate number of viral copies  (<250 copies / mL). A negative result must be combined with clinical  observations, patient history, and epidemiological information. If result is POSITIVE SARS-CoV-2 target nucleic acids are DETECTED. The SARS-CoV-2 RNA is generally detectable in upper and lower  respiratory specimens dur ing the acute phase of infection.  Positive  results are indicative of active infection with SARS-CoV-2.  Clinical  correlation with patient history and other diagnostic information is  necessary to determine patient infection status.  Positive results do  not rule out bacterial infection or co-infection with other viruses. If result is PRESUMPTIVE POSTIVE SARS-CoV-2 nucleic acids MAY BE PRESENT.   A presumptive  positive result was obtained on the submitted specimen  and confirmed on repeat testing.  While 2019 novel coronavirus  (SARS-CoV-2) nucleic acids may be present in the submitted sample  additional confirmatory testing may be necessary for epidemiological  and / or clinical management purposes  to differentiate between  SARS-CoV-2 and other Sarbecovirus currently known to infect humans.  If clinically indicated additional testing with an alternate test  methodology [phone]) is advised. The SARS-CoV-2 RNA is generally  detectable in upper and lower respiratory sp ecimens during the acute  phase of infection. The expected result is Negative. Fact Sheet for Patients:  StrictlyIdeas.no Fact Sheet for Healthcare Providers: BankingDealers.co.za This test is not yet approved or cleared by the [place] FDA and has been authorized for detection and/or diagnosis of SARS-CoV-2 by FDA under an Emergency Use Authorization (EUA).  This EUA will remain in effect (meaning this test can be used) for the duration of the COVID-19 declaration under Section 564(b)(1) of the Act, 21 U.S.C. section 360bbb-3(b)(1), unless the authorization is terminated or revoked sooner. Performed at [facility], [place] [address] [place], [place] [address]     Sepsis Labs: Invalid input(s): PROCALCITONIN, LACTICIDVEN  Urine analysis:    Component Value Date/Time   COLORURINE YELLOW 05/02/2019 0832   APPEARANCEUR CLEAR 05/02/2019 0832   LABSPEC 1.017 05/02/2019 0832   PHURINE 5.0 05/02/2019 0832   GLUCOSEU NEGATIVE 05/02/2019 0832   HGBUR SMALL (A) 05/02/2019 0832   BILIRUBINUR NEGATIVE 05/02/2019 0832   KETONESUR NEGATIVE 05/02/2019 0832   PROTEINUR NEGATIVE 05/02/2019 0832   UROBILINOGEN 0.2 06/01/2010 0915   NITRITE NEGATIVE 05/02/2019 0832   LEUKOCYTESUR SMALL (A) 05/02/2019 0832    Anemia Panel: No results for input(s): VITAMINB12,  FOLATE, FERRITIN, TIBC, IRON, RETICCTPCT in the last 72 hours.  Thyroid Function Tests: No results for input(s): TSH, T4TOTAL, FREET4, T3FREE, THYROIDAB in the last 72 hours.  Lipid Profile: Recent Labs    05/06/19 0533  TRIG 116    CBG: No results for input(s): GLUCAP in the last 168 hours.  HbA1C: No results for input(s): HGBA1C in the last 72 hours.  BNP (last 3 results): No results for input(s): PROBNP in the last 8760 hours.  Cardiac Enzymes: No results for input(s): CKTOTAL, CKMB, CKMBINDEX, TROPONINI in the last 168 hours.  Coagulation Profile: No results for input(s): INR, PROTIME in the last 168 hours.  Liver Function Tests: Recent Labs  Lab 05/01/19 1524 05/02/19 0303 05/03/19 0638 05/04/19 0345 05/06/19 0533  AST 32 24 18 17 20   ALT 24 20 16 16 18   ALKPHOS 105 84 78 80 94  BILITOT 0.9 0.4 0.3 0.6 0.3  PROT 6.4* 4.9* 4.6* 4.5* 4.8*  ALBUMIN 3.3* 2.4* 2.2* 2.1* 2.1*   Recent Labs  Lab 05/01/19 1524  LIPASE 30   No results for input(s): AMMONIA in the last 168 hours.  Basic Metabolic Panel: Recent Labs  Lab 05/01/19 1527 05/02/19 0303 05/03/19 0638 05/04/19 0345 05/05/19 0414 05/06/19 0533  NA  --  137 138 140 141 141  K  --  3.8 3.9 3.4* 3.5 3.6  CL  --  101 107 113*  115* 116*  CO2  --  26 26 23  21* 21*  GLUCOSE  --  137* 137* 120* 158* 133*  BUN  --  30* 23 17 15 12   CREATININE  --  1.89* 1.49* 1.31* 1.14* 1.10*  CALCIUM  --  9.7 8.5* 8.2* 8.2* 8.7*  MG 2.1  --  1.8 1.8  --  2.0  PHOS  --   --   --   --   --  1.5*   GFR: Estimated Creatinine Clearance: 45.1 mL/min (A) (by C-G formula based on SCr of 1.1 mg/dL (H)).  CBC: Recent Labs  Lab 05/01/19 1524 05/02/19 0303 05/03/19 0638 05/04/19 0345 05/06/19 0533  WBC 9.8 8.2 8.7 7.8 7.3  NEUTROABS  --   --  5.7  --  5.0  HGB 14.2 12.4 11.5* 11.6* 11.8*  HCT 45.6 40.1 37.8 39.2 39.5  MCV 94.4 96.4 97.4 98.5 96.8  PLT 361 273 236 166 254    Procedures:  EGD on 05/03/2019 An  esophagitis distally, nonbleeding erosive gastropathy, gastric ulcer with a clean ulcer base, multiple gastric polyps, acquired duodenal stenosis and mucosal change in the duodenum.  Biopsies suggest this chronic inflammatory change.  Negative for malignancy or dysplasia.  Microbiology summarized: [id number] negative.  Assessment & Plan: Acquired duodenal stenosis/gastric outlet obstruction -Reportedly not amenable to endoscopy.   -Pathology suggest this chronic inflammatory change.  Negative for malignancy or dysplasia. -Continue n.p.o., IVF, antiemetics, PPI and NG tube per GI. -Plan for bypass with laparoscopic gastrojejunostomy with feeding gastrojejunostomy tube -We will hold off TPN -Closely monitor electrolytes and replenish aggressively. -Dietitian consulted.  AKI: Likely due to GI loss from nausea and vomiting.  Resolved. -Monitor renal function -Continue IV fluid  Hypophosphatemia: Likely due to malnutrition from n.p.o. -We will replete with K-Phos.  Hypertension: Normotensive  Hypertension: Normotensive -Hold home losartan and diuretics.  GERD: -Continue PPI  Hypothyroidism: -IV Synthroid while n.p.o.  Primary hyperparathyroidism: Stable -On Reclast outpatient.  Obstructive sleep apnea -Nightly CPAP if she tolerates but difficult with NG tube.  Class II obesity: BMI 36.94 -Encourage lifestyle change to lose weight  DVT prophylaxis: Subcu heparin Code Status: Full code Family Communication: Patient and/or RN. Available if any question.  Disposition Plan: Remains inpatient for further evaluation of gastric outlet obstruction. Consultants: GI, general surgery   Antimicrobials: Anti-infectives (From admission, onward)   Start     Dose/Rate Route Frequency Ordered Stop   05/06/19 0815  [MAR Hold]  cefoTEtan (CEFOTAN) 2 g in sodium chloride 0.9 % 100 mL IVPB     (MAR Hold since Tue 05/06/2019 at 0855.Hold Reason: Transfer to a Procedural area.)   2 g 200 mL/hr  over 30 Minutes Intravenous On call to O.R. 05/06/19 0808 05/06/19 1105      Sch Meds:  Scheduled Meds: . [MAR Hold] bupivacaine liposome  20 mL Infiltration Once  . [MAR Hold] chlorhexidine  15 mL Mouth Rinse BID  . insulin aspart  0-9 Units Subcutaneous Q6H  . [MAR Hold] levothyroxine  25 mcg Intravenous Daily  . [MAR Hold] mouth rinse  15 mL Mouth Rinse q12n4p  . [MAR Hold] pantoprazole (PROTONIX) IV  40 mg Intravenous Q12H   Continuous Infusions: . [MAR Hold] sodium chloride Stopped (05/05/19 1505)  . dextrose 5 % and 0.9% NaCl    . lactated ringers 0 mL/hr at 05/06/19 1103  . [MAR Hold] potassium PHOSPHATE IVPB (in mmol)    . TPN ADULT (ION)     PRN Meds:.[facility]  Hold] sodium chloride, bupivacaine liposome, bupivacaine-EPINEPHrine, [MAR Hold] menthol-cetylpyridinium, [MAR Hold]  morphine injection, [MAR Hold] ondansetron **OR** [MAR Hold] ondansetron (ZOFRAN) IV, ondansetron (ZOFRAN) IV, [MAR Hold] phenol, ringers, [MAR Hold] sodium chloride flush, [MAR Hold] sodium chloride flush, sodium chloride irrigation    T. [facility] [place]  If 7PM-7AM, please contact night-coverage [email or web address] Password [email or web address] 05/06/2019, 1:34 PM

## 2019-05-06 NOTE — Anesthesia Preprocedure Evaluation (Signed)
Anesthesia Evaluation  Patient identified by MRN, date of birth, ID band Patient awake    Reviewed: Allergy & Precautions, NPO status , Patient's Chart, lab work & pertinent test results  History of Anesthesia Complications (+) PONV and history of anesthetic complications  Airway Mallampati: II  TM Distance: <3 FB Neck ROM: Full    Dental  (+) Dental Advisory Given, Caps   Pulmonary asthma , sleep apnea and Continuous Positive Airway Pressure Ventilation , Recent URI , Resolved, former smoker,    breath sounds clear to auscultation       Cardiovascular hypertension, Pt. on medications (-) anginaNormal cardiovascular exam Rhythm:Regular Rate:Normal  ' 16 ECHO: EF 60-65%, valves OK   Neuro/Psych PSYCHIATRIC DISORDERS Anxiety Depression Recent vertigo Chronic back pain    GI/Hepatic Neg liver ROS, GERD  Medicated, Controlled and Poorly Controlled,  Endo/Other  diabetes, Well Controlled, Type 2Hypothyroidism Obesity   Renal/GU Renal InsufficiencyRenal diseasestones     Musculoskeletal  (+) Arthritis ,   Abdominal (+) + obese,   Peds  Hematology  (+) Blood dyscrasia (Hb 11.5), anemia ,   Anesthesia Other Findings   Reproductive/Obstetrics                             Anesthesia Physical  Anesthesia Plan  ASA: III  Anesthesia Plan: General   Post-op Pain Management:    Induction: Intravenous  PONV Risk Score and Plan: 3 and 4 or greater and Propofol infusion, Treatment may vary due to age or medical condition and Ondansetron  Airway Management Planned: Oral ETT  Additional Equipment: None  Intra-op Plan:   Post-operative Plan:   Informed Consent: I have reviewed the patients History and Physical, chart, labs and discussed the procedure including the risks, benefits and alternatives for the proposed anesthesia with the patient or authorized representative who has indicated his/her  understanding and acceptance.     Dental advisory given  Plan Discussed with: CRNA  Anesthesia Plan Comments:         Anesthesia Quick Evaluation

## 2019-05-06 NOTE — Progress Notes (Signed)
PHARMACY - ADULT TOTAL PARENTERAL NUTRITION CONSULT NOTE   Pharmacy Consult for TPN Indication:  Gastric outlet obstruction, duodenal stenosis/suspect malignant  HPI:  31 yoF admit 7/30 with N/V, NG tube placed, GI and surgery following.   TPN Access: PICC placed 8/3 TPN start date: plan for 8/4    Patient Measurements: Estimated body mass index is 38.9 kg/m as calculated from the following:   Height as of this encounter: 5\' 3"  (1.6 m).   Weight as of this encounter: 219 lb 9.3 oz (99.6 kg).  Intake/Output:  Estimated Nutritional Needs - Per RD recommendations on (pending) kCal: Protein:  Fluid:   Current Nutrition: NPO, ice chips  IVF: D5NS at 125 ml/hr  Insulin Requirements:    Recent Labs    05/04/19 0345 05/05/19 0414 05/05/19 1403 05/06/19 0533  NA 140 141  --  141  K 3.4* 3.5  --  3.6  CL 113* 115*  --  116*  CO2 23 21*  --  21*  GLUCOSE 120* 158*  --  133*  BUN 17 15  --  12  CREATININE 1.31* 1.14*  --  1.10*  CALCIUM 8.2* 8.2*  --  PENDING  PHOS  --   --   --  1.5*  MG 1.8  --   --  2.0  ALBUMIN 2.1*  --   --  2.1*  ALKPHOS 80  --   --  94  AST 17  --   --  20  ALT 16  --   --  18  BILITOT 0.6  --   --  0.3  TRIG  --   --   --  116  PREALBUMIN  --   --  <5* <5*   ASSESSMENT                                                                                                           Glucose - serum glucose elevated daily  Electrolytes - Na, K, Mag wnl, corrCa 10.3, Phos low at 1.5. Cl elevated, CO2 low  Renal - SCr 1.1, much improved from 2.04 on admit  LFTs -  wnl  TGs - 116 (8/4)  Prealbumin - <5 (8/4)  Custom TPN delivers: at goal rate 100 ml/hr Protein 120gm/day, 2200 kCal/day Dextrose 360gm/day Lipids 53g/day  Significant events:  8/4: gastrojejunostomy with GJ tube placement  PLAN  Today 8/3: Pot Phosphate 20  mMol bolus   At 1800 today  Start custom TPN at 40 ml/hr  Standard Na and Ca in formula, increased K, Mag, and Phos with risk for refeeding syndrome  TPN to contain standard multivitamins daily  Trace elements only M,W,F due to national back-order  Reduce IVF to 60 ml/hr.  Begin q6h CBG with sensitive -scale Novolog SSI coverage  Plan to advance TPN as tolerated to the goal rate.  TPN lab panels on Mondays & Thursdays.  BMET, Mag and Phos levels 8/5  F/u daily.   Minda Ditto PharmD Pager [phone] 05/06/2019, 7:33 AM

## 2019-05-06 NOTE — Progress Notes (Signed)
[name] [id number] 1935/07/31  CARE TEAM:  PCP: Burnard Bunting, MD  Outpatient Care Team: Patient Care Team: Burnard Bunting, MD as PCP - General (Internal Medicine) Starling Manns, MD as Consulting Physician (Orthopedic Surgery) Norlene Duel, RN as Registered Nurse  Inpatient Treatment Team: Treatment Team: Attending Provider: Mercy Riding, MD; Consulting Physician: Mansouraty, Telford Nab., MD; Registered Nurse: Norlene Duel, RN; Rounding Team: Fatima Blank, MD; Registered Nurse: Oleta Mouse, RN; Rounding Team: Edison Pace, Md, MD; Technician: Ileene Rubens, NT; Registered Nurse: Heloise Ochoa, RN; Technician: Leda Quail, NT; Utilization Review: Tressie Stalker, RN   Problem List:   Principal Problem:   Nausea and/or vomiting Active Problems:   Depression   Hypertension   Obesity, Class II, BMI 35-39.9   Hyperparathyroidism, primary ([place])   OSA (obstructive sleep apnea)   CKD (chronic kidney disease) stage 3, GFR 30-59 ml/min (HCC)   Hypothyroid   Abnormal CT scan, stomach   Abnormal CT scan, sigmoid colon   Gastric outlet obstruction   Duodenal obstruction, acquired   3 Days Post-Op  05/03/2019   ESOPHAGOGASTRODUODENOSCOPY (EGD) WITH PROPOFOL BIOPSY   Assessment  Gastric outlet obstruction from probable benign stricture  [facility] Stay = 5 days)  Plan:  I think she would benefit from bypass of the chronic stricture in the duodenum.  Plan laparoscopic antecolic gastrojejunostomy with feeding gastrojejunostomy tube.  That way we can start intestinal tube feeds beyond the blockage and wait for her gastric ileus to resolve.  Ideally would try and resect that region, but with her being malnourished that could increased risks of leak and other problems markedly.  I suspicious for cancer in the absence of lymphadenopathy and negative biopsies despite aggressive evaluation and her prior history of ulcers and other issues point more towards  a benign stricture.  I discussed with her gastrologist, Dr. Hilarie Fredrickson.  I discussed with the patient.  Discussed with Dr. Cyndia Skeeters with hospitalist.  He feels like she could tolerate operation.  I called and discussed with the patient's husband as well.  All are in agreement with this plan.  We will try to do it later this morning.  The anatomy & physiology of the foregut and digestive tract was discussed.  The gastric pathophysiology was discussed.  Natural history risks without surgery was discussed.   The patient's situation is not adequately controlled by medicines and other non-operative treatments.  I feel the risks of no intervention will lead to serious problems that outweigh the operative risks; therefore, I recommended surgery to resect part of the stomach.  Need for a thorough workup to rule out the differential diagnosis and plan treatment was explained.  I explained laparoscopic techniques with possible need for an open approach.  Risks such as bleeding, infection, abscess, leak, injury to other organs, need for repair of tissues / organs, need for further treatment, heart attack, death, and other risks were discussed.   I noted a good likelihood this will help address the problem.  Goals of post-operative recovery were discussed as well.  Possibility that this will not correct all symptoms was explained.  Post-operative dysphagia, need for short-term liquid & pureed diet, inability to vomit, possibility of reherniation, possible need for medicines to help control symptoms in addition to surgery were discussed.  Possible need for a feeding tube was discussed.  We will work to minimize complications.   Educational handouts further explaining the pathology, treatment options, and dysphagia diet was given as well.  Questions were  answered.  The patient expresses understanding & wishes to proceed with surgery.    -VTE prophylaxis- SCDs, etc -mobilize as tolerated to help recovery  45 minutes spent in  review, evaluation, examination, counseling, and coordination of care.  More than 50% of that time was spent in counseling.  05/06/2019    Subjective: (Chief complaint)  Tired.  Denies abdominal pain.  Nasogastric tube in place.  Ready for something more definitive such as surgery.  Objective:  Vital signs:  Vitals:   05/05/19 1341 05/05/19 1445 05/05/19 2115 05/06/19 0535  BP: 139/65  (!) 121/51 136/62  Pulse: 75  72 73  Resp: 17  15 16   Temp: 98.8 F (37.1 C)  98.6 F (37 C) 98.1 F (36.7 C)  TempSrc: Oral  Oral Oral  SpO2: 94%  90% 95%  Weight:  99.6 kg    Height:        Last BM Date: 05/01/19  Intake/Output   Yesterday:  08/03 0701 - 08/04 0700 In: 3468.6 [P.O.:170; I.V.:2826.5; IV Piggyback:472.2] Out: 1225 [Urine:775; Emesis/NG output:450] This shift:  No intake/output data recorded.  Bowel function:  Flatus: No  BM:  No  Drain: G-tube with clear output.  No bile.   Physical Exam:  General: Pt awake/alert/oriented x4 in no acute distress Eyes: PERRL, normal EOM.  Sclera clear.  No icterus Neuro: CN II-XII intact w/o focal sensory/motor deficits. Lymph: No head/neck/groin lymphadenopathy Psych:  No delerium/psychosis/paranoia HENT: Normocephalic, Mucus membranes moist.  No thrush Neck: Supple, No tracheal deviation Chest: No chest wall pain w good excursion CV:  Pulses intact.  Regular rhythm MS: Normal AROM mjr joints.  No obvious deformity  Abdomen: Soft. Obese.  Nondistended.  Nontender.  No evidence of peritonitis.  No incarcerated hernias.  Ext:   No deformity.  No mjr edema.  No cyanosis Skin: No petechiae / purpura  Results:   Cultures: Recent Results (from the past 720 hour(s))  SARS Coronavirus 2 (CEPHEID - Performed in [place] hospital lab), Hosp Order     Status: None   Collection Time: 05/01/19  3:46 PM   Specimen: Nasopharyngeal Swab  Result Value Ref Range Status   SARS Coronavirus 2 NEGATIVE NEGATIVE Final     Comment: (NOTE) If result is NEGATIVE SARS-CoV-2 target nucleic acids are NOT DETECTED. The SARS-CoV-2 RNA is generally detectable in upper and lower  respiratory specimens during the acute phase of infection. The lowest  concentration of SARS-CoV-2 viral copies this assay can detect is 250  copies / mL. A negative result does not preclude SARS-CoV-2 infection  and should not be used as the sole basis for treatment or other  patient management decisions.  A negative result may occur with  improper specimen collection / handling, submission of specimen other  than nasopharyngeal swab, presence of viral mutation(s) within the  areas targeted by this assay, and inadequate number of viral copies  (<250 copies / mL). A negative result must be combined with clinical  observations, patient history, and epidemiological information. If result is POSITIVE SARS-CoV-2 target nucleic acids are DETECTED. The SARS-CoV-2 RNA is generally detectable in upper and lower  respiratory specimens dur ing the acute phase of infection.  Positive  results are indicative of active infection with SARS-CoV-2.  Clinical  correlation with patient history and other diagnostic information is  necessary to determine patient infection status.  Positive results do  not rule out bacterial infection or co-infection with other viruses. If result is PRESUMPTIVE POSTIVE SARS-CoV-2 nucleic  acids MAY BE PRESENT.   A presumptive positive result was obtained on the submitted specimen  and confirmed on repeat testing.  While 2019 novel coronavirus  (SARS-CoV-2) nucleic acids may be present in the submitted sample  additional confirmatory testing may be necessary for epidemiological  and / or clinical management purposes  to differentiate between  SARS-CoV-2 and other Sarbecovirus currently known to infect humans.  If clinically indicated additional testing with an alternate test  methodology [id number]) is advised. The SARS-CoV-2  RNA is generally  detectable in upper and lower respiratory sp ecimens during the acute  phase of infection. The expected result is Negative. Fact Sheet for Patients:  StrictlyIdeas.no Fact Sheet for Healthcare Providers: BankingDealers.co.za This test is not yet approved or cleared by the [place] FDA and has been authorized for detection and/or diagnosis of SARS-CoV-2 by FDA under an Emergency Use Authorization (EUA).  This EUA will remain in effect (meaning this test can be used) for the duration of the COVID-19 declaration under Section 564(b)(1) of the Act, 21 U.S.C. section 360bbb-3(b)(1), unless the authorization is terminated or revoked sooner. Performed at [facility], [place] [address], [place], [place] [address]     Labs: Results for orders placed or performed during the hospital encounter of 05/01/19 (from the past 48 hour(s))  Basic metabolic panel     Status: Abnormal   Collection Time: 05/05/19  4:14 AM  Result Value Ref Range   Sodium 141 135 - 145 mmol/L   Potassium 3.5 3.5 - 5.1 mmol/L   Chloride 115 (H) 98 - 111 mmol/L   CO2 21 (L) 22 - 32 mmol/L   Glucose, Bld 158 (H) 70 - 99 mg/dL   BUN 15 8 - 23 mg/dL   Creatinine, Ser 1.14 (H) 0.44 - 1.00 mg/dL   Calcium 8.2 (L) 8.9 - 10.3 mg/dL   GFR calc non Af Amer 45 (L) >60 mL/min   GFR calc Af Amer 52 (L) >60 mL/min   Anion gap 5 5 - 15    Comment: Performed at [facility], [place] [address] [place], [place] [address]  Prealbumin     Status: Abnormal   Collection Time: 05/05/19  2:03 PM  Result Value Ref Range   Prealbumin <5 (L) 18 - 38 mg/dL    Comment: Performed at [facility], [place] [address] [place], [place] [address]  Comprehensive metabolic panel     Status: Abnormal   Collection Time: 05/06/19  5:33 AM  Result Value Ref Range   Sodium 141 135 - 145 mmol/L   Potassium 3.6 3.5 - 5.1 mmol/L    Chloride 116 (H) 98 - 111 mmol/L   CO2 21 (L) 22 - 32 mmol/L   Glucose, Bld 133 (H) 70 - 99 mg/dL   BUN 12 8 - 23 mg/dL   Creatinine, Ser 1.10 (H) 0.44 - 1.00 mg/dL   Calcium 8.7 (L) 8.9 - 10.3 mg/dL   Total Protein 4.8 (L) 6.5 - 8.1 g/dL   Albumin 2.1 (L) 3.5 - 5.0 g/dL   AST 20 15 - 41 U/L   ALT 18 0 - 44 U/L   Alkaline Phosphatase 94 38 - 126 U/L   Total Bilirubin 0.3 0.3 - 1.2 mg/dL   GFR calc non Af Amer 47 (L) >60 mL/min   GFR calc Af Amer 55 (L) >60 mL/min   Anion gap 4 (L) 5 - 15    Comment: Performed at [facility], [place] [address], [place], [place] [address]  Prealbumin     Status: Abnormal   Collection Time: 05/06/19  5:33 AM  Result Value Ref Range   Prealbumin <5 (L) 18 - 38 mg/dL    Comment: Performed at [facility], [place] [address], [place], [place] [address]  Magnesium     Status: None   Collection Time: 05/06/19  5:33 AM  Result Value Ref Range   Magnesium 2.0 1.7 - 2.4 mg/dL    Comment: Performed at [facility], [place] [address], [place], [place] [address]  Phosphorus     Status: Abnormal   Collection Time: 05/06/19  5:33 AM  Result Value Ref Range   Phosphorus 1.5 (L) 2.5 - 4.6 mg/dL    Comment: Performed at [facility], [place] [address] [place], [place] [address]  Triglycerides     Status: None   Collection Time: 05/06/19  5:33 AM  Result Value Ref Range   Triglycerides 116 <150 mg/dL    Comment: Performed at [facility], [place] [address], [place], [place] [address]  CBC     Status: Abnormal   Collection Time: 05/06/19  5:33 AM  Result Value Ref Range   WBC 7.3 4.0 - 10.5 K/uL   RBC 4.08 3.87 - 5.11 MIL/uL   Hemoglobin 11.8 (L) 12.0 - 15.0 g/dL   HCT 39.5 36.0 - 46.0 %   MCV 96.8 80.0 - 100.0 fL   MCH 28.9 26.0 - 34.0 pg   MCHC 29.9 (L) 30.0 - 36.0 g/dL   RDW 14.7 11.5 - 15.5 %   Platelets 254 150 - 400 K/uL   nRBC 0.0 0.0 - 0.2 %    Comment: Performed at  [facility], [place] [address], [place], [place] [address]  Differential     Status: None   Collection Time: 05/06/19  5:33 AM  Result Value Ref Range   Neutrophils Relative % 67 %   Neutro Abs 5.0 1.7 - 7.7 K/uL   Lymphocytes Relative 15 %   Lymphs Abs 1.1 0.7 - 4.0 K/uL   Monocytes Relative 12 %   Monocytes Absolute 0.9 0.1 - 1.0 K/uL   Eosinophils Relative 5 %   Eosinophils Absolute 0.3 0.0 - 0.5 K/uL   Basophils Relative 1 %   Basophils Absolute 0.1 0.0 - 0.1 K/uL   Immature Granulocytes 0 %   Abs Immature Granulocytes 0.01 0.00 - 0.07 K/uL    Comment: Performed at [facility], [place] [address], [place], [place] [address]    Imaging / Studies: [place] Ekg Site Rite  Result Date: 05/05/2019 If [facility] image not attached, placement could not be confirmed due to current cardiac rhythm.   Medications / Allergies: per chart  Antibiotics: Anti-infectives (From admission, onward)   Start     Dose/Rate Route Frequency Ordered Stop   05/06/19 0815  cefoTEtan (CEFOTAN) 2 g in sodium chloride 0.9 % 100 mL IVPB     2 g 200 mL/hr over 30 Minutes Intravenous On call to O.R. 05/06/19 1093 05/07/19 0559        Note: Portions of this report may have been transcribed using voice recognition software. Every effort was made to ensure accuracy; however, inadvertent computerized transcription errors may be present.   Any transcriptional errors that result from this process are unintentional.     Adin Hector, MD, FACS, MASCRS Gastrointestinal and Minimally Invasive Surgery  [address] [place], [place] [address] [phone] Main / Paging [phone] Fax

## 2019-05-06 NOTE — Progress Notes (Addendum)
Initial Nutrition Assessment  DOCUMENTATION CODES:   Obesity unspecified  INTERVENTION:   Monitor magnesium, potassium, and phosphorus daily for at least 3 days, MD to replete as needed, as pt is at risk for refeeding syndrome given poor PO for >1 week, weight loss and low Phos levels.  Tube feeding recommendations: Initiate Osmolite 1.5 @ 20 ml/hr, advance by 10 ml every 24 hours to goal of 50 ml/hr. At goal rate this provides 1800 kcal, 75g protein and 914 ml H2O.  **Addendum: Received consult for tube feeding initiation and management. Placed tube feeding orders.  NUTRITION DIAGNOSIS:   Increased nutrient needs related to post-op healing as evidenced by estimated needs.  GOAL:   Patient will meet greater than or equal to 90% of their needs  MONITOR:   Diet advancement, Labs, Weight trends, I & O's(TPN)  REASON FOR ASSESSMENT:   Consult New TPN/TNA  ASSESSMENT:   84 year old female with history of DM-2, GERD, HTN, morbid obesity, hyperlipidemia, diverticulosis, hyperparathyroidism, anxiety disorder and bronchiectasis; admitted on 05/01/2019, presented with complaint of nausea and vomiting, was found to have gastric outlet obstruction concerning for malignancy.  7/30: admitted with GOO 8/1: EGD revealed small hiatal hernia, gastric ulcer, esophagitis, acquired duodenal stenosis, NGT was placed for suction  **RD working remotely**  RD consulted for TPN but now per MD note, will hold off on TPN for now. Pt has been in the OR today for bypass with laparoscopic gastrojejunostomy with feeding gastrojejunostomy tube. Will leave TF recommendations when tube is ready to be used.  Per chart review, pt had almost 2 months of N/V/diarrhea related to GOO. Pt eventually was unable to keep liquids down. Given poor PO, pt will be at refeeding risk when TF are initiated. Will advance slowly.   Per weight records, pt has lost 26 lbs since 4/28 (11% wt loss x 3 months, significant for  time frame).  NGT output: 450 ml x 24 hours.  Medications: D5 -.9% NaCl infusion, Lactated Ringers infusion, K-Phos infusion  Labs reviewed: Low Phos GFR:47  NUTRITION - FOCUSED PHYSICAL EXAM:  Unable to perform-working remotely.  Diet Order:   Diet Order            Diet NPO time specified Except for: Ice Chips  Diet effective midnight              EDUCATION NEEDS:   Not appropriate for education at this time  Skin:  Skin Assessment: Reviewed RN Assessment  Last BM:  7/30  Height:   Ht Readings from Last 1 Encounters:  05/01/19 5\' 3"  (1.6 m)    Weight:   Wt Readings from Last 1 Encounters:  05/05/19 99.6 kg    Ideal Body Weight:  52.3 kg  BMI:  Body mass index is 38.9 kg/m.  Estimated Nutritional Needs:   Kcal:  1650-1850  Protein:  70-80g  Fluid:  1.8L/day  Clayton Bibles, MS, RD, LDN [employer] Pager: [phone] After Hours Pager: [phone]

## 2019-05-07 LAB — COMPREHENSIVE METABOLIC PANEL
ALT: 32 U/L (ref 0–44)
AST: 44 U/L — ABNORMAL HIGH (ref 15–41)
Albumin: 2.1 g/dL — ABNORMAL LOW (ref 3.5–5.0)
Alkaline Phosphatase: 101 U/L (ref 38–126)
Anion gap: 4 — ABNORMAL LOW (ref 5–15)
BUN: 12 mg/dL (ref 8–23)
CO2: 21 mmol/L — ABNORMAL LOW (ref 22–32)
Calcium: 8.9 mg/dL (ref 8.9–10.3)
Chloride: 114 mmol/L — ABNORMAL HIGH (ref 98–111)
Creatinine, Ser: 1.21 mg/dL — ABNORMAL HIGH (ref 0.44–1.00)
GFR calc Af Amer: 49 mL/min — ABNORMAL LOW (ref >60)
GFR calc non Af Amer: 42 mL/min — ABNORMAL LOW (ref >60)
Glucose, Bld: 122 mg/dL — ABNORMAL HIGH (ref 70–99)
Potassium: 4.2 mmol/L (ref 3.5–5.1)
Sodium: 139 mmol/L (ref 135–145)
Total Bilirubin: 0.2 mg/dL — ABNORMAL LOW (ref 0.3–1.2)
Total Protein: 4.6 g/dL — ABNORMAL LOW (ref 6.5–8.1)

## 2019-05-07 LAB — CBC
HCT: 35.6 % — ABNORMAL LOW (ref 36.0–46.0)
Hemoglobin: 11 g/dL — ABNORMAL LOW (ref 12.0–15.0)
MCH: 29.7 pg (ref 26.0–34.0)
MCHC: 30.9 g/dL (ref 30.0–36.0)
MCV: 96.2 fL (ref 80.0–100.0)
Platelets: 239 10*3/uL (ref 150–400)
RBC: 3.7 MIL/uL — ABNORMAL LOW (ref 3.87–5.11)
RDW: 15 % (ref 11.5–15.5)
WBC: 13.4 10*3/uL — ABNORMAL HIGH (ref 4.0–10.5)
nRBC: 0 % (ref 0.0–0.2)

## 2019-05-07 LAB — PHOSPHORUS: Phosphorus: 2.3 mg/dL — ABNORMAL LOW (ref 2.5–4.6)

## 2019-05-07 LAB — GLUCOSE, CAPILLARY
Glucose-Capillary: 107 mg/dL — ABNORMAL HIGH (ref 70–99)
Glucose-Capillary: 113 mg/dL — ABNORMAL HIGH (ref 70–99)
Glucose-Capillary: 116 mg/dL — ABNORMAL HIGH (ref 70–99)

## 2019-05-07 LAB — MAGNESIUM: Magnesium: 1.9 mg/dL (ref 1.7–2.4)

## 2019-05-07 MED ORDER — LEVOTHYROXINE SODIUM 50 MCG PO TABS: 50.0000 ug | ORAL_TABLET | Freq: Every day | ORAL | Status: DC

## 2019-05-07 MED ORDER — OSMOLITE 1.5 CAL PO LIQD
1000.0000 mL | ORAL | Status: DC
  Filled 2019-05-07: qty 1000

## 2019-05-07 MED ORDER — SERTRALINE HCL 50 MG PO TABS
50.0000 mg | ORAL_TABLET | Freq: Every day | ORAL | Status: DC
  Administered 2019-05-08 – 2019-05-09 (×2): 50 mg
  Filled 2019-05-07: qty 1

## 2019-05-07 MED ORDER — LEVOTHYROXINE SODIUM 100 MCG/5ML IV SOLN
25.0000 ug | Freq: Every day | INTRAVENOUS | Status: DC
  Administered 2019-05-07 – 2019-05-15 (×9): 25 ug via INTRAVENOUS
  Filled 2019-05-07 (×9): qty 5

## 2019-05-07 MED ORDER — HYDROCODONE-ACETAMINOPHEN 7.5-325 MG/15ML PO SOLN
10.0000 mL | ORAL | Status: DC | PRN
  Administered 2019-05-07 – 2019-05-14 (×6): 10 mL via ORAL
  Filled 2019-05-07 (×8): qty 15

## 2019-05-07 NOTE — Progress Notes (Signed)
Patient ID: KANDY TOWERY, female   DOB: 14-Aug-1935, 84 y.o.   MRN: [id number]    Progress Note   Subjective   Day #1 postop laparoscopic gastrojejunostomy and placement of feeding gastrojejunostomy tube secondary to duodenal obstruction.  Patient feeling okay today, happy to have NG tube out, denies any nausea.  Abdomen is uncomfortable but seems to be tolerating and mentating well   Objective   Vital signs in last 24 hours: Temp:  [97.6 F (36.4 C)-98 F (36.7 C)] 98 F (36.7 C) (08/05 0946) Pulse Rate:  [70-90] 70 (08/05 0946) Resp:  [14-20] 20 (08/05 0553) BP: (115-150)/(57-77) 116/58 (08/05 0946) SpO2:  [92 %-100 %] 100 % (08/05 0946) Weight:  [106.5 kg] 106.5 kg (08/05 0553) Last BM Date: 05/01/19 General: Elderly   white female in NAD Heart:  Regular rate and rhythm; no murmurs Lungs: Respirations even and unlabored, lungs CTA bilaterally Abdomen:  Soft, tender and nondistended.,  Bowel sounds present, GJ tube in place Extremities:  Without edema. Neurologic:  Alert and oriented,  grossly normal neurologically. Psych:  Cooperative. Normal mood and affect.  Intake/Output from previous day: 08/04 0701 - 08/05 0700 In: 3720.3 [P.O.:60; I.V.:2158.6; NG/GT:571.7; IV Piggyback:930.1] Out: 1600 [Urine:750; Emesis/NG output:650; Drains:150; Blood:50] Intake/Output this shift: Total I/O In: 180 [P.O.:180] Out: 50 [Drains:50]  Lab Results: Recent Labs    05/06/19 0533 05/07/19 0419  WBC 7.3 13.4*  HGB 11.8* 11.0*  HCT 39.5 35.6*  PLT 254 239   BMET Recent Labs    05/05/19 0414 05/06/19 0533 05/07/19 0419  NA 141 141 139  K 3.5 3.6 4.2  CL 115* 116* 114*  CO2 21* 21* 21*  GLUCOSE 158* 133* 122*  BUN 15 12 12   CREATININE 1.14* 1.10* 1.21*  CALCIUM 8.2* 8.7* 8.9   LFT Recent Labs    05/07/19 0419  PROT 4.6*  ALBUMIN 2.1*  AST 44*  ALT 32  ALKPHOS 101  BILITOT 0.2*   PT/INR No results for input(s): LABPROT, INR in the last 72 hours.   Studies/Results: [place] [employer]  Result Date: 05/05/2019 If [employer] not attached, placement could not be confirmed due to current cardiac rhythm.      Assessment / Plan:    #62 84 year old white female with persistent gastric outlet obstruction secondary to tight duodenal stricture.  Biopsies showed densely inflamed duodenal mucosa without evidence of dysplasia or malignancy. Patient is status post laparoscopic gastrojejunostomy and placement of feeding gastrojejunostomy tube yesterday.  Dr. Johney Maine did not find any obvious evidence of malignancy at the time of surgery yesterday.  Duodenal stricture may all be inflammatory, however still cannot rule out underlying malignancy.  Would like to continue twice daily PPI IV, until she is taking p.o.'s well, then convert to oral PPI twice daily which will need to be continued on discharge.  Anticipate from GI standpoint that she will need repeat EGD with Dr. Silverio Decamp in 4 to 6 weeks postop to reevaluate the duodenal stricture.  We will arrange outpatient follow-up with Dr. Rush Landmark in about 3 weeks. GI will sign off, available if we can help.   Principal Problem:   Nausea and/or vomiting Active Problems:   Depression   Hypertension   Obesity, Class II, BMI 35-39.9   Hyperparathyroidism, primary (HCC)   OSA (obstructive sleep apnea)   CKD (chronic kidney disease) stage 3, GFR 30-59 ml/min (HCC)   Hypothyroid   Abnormal CT scan, stomach   Abnormal CT scan, sigmoid colon  Gastric outlet obstruction   Duodenal obstruction, acquired   Protein-calorie malnutrition, moderate (HCC)     LOS: 6 days   Maribel Hadley EsterwoodPA-C  05/07/2019, 11:36 AM

## 2019-05-07 NOTE — Progress Notes (Signed)
PROGRESS NOTE  [name] TKZ:[id number] DOB: [birth year]   PCP: Burnard Bunting, MD  Patient is from: Home  DOA: 05/01/2019 LOS: 6  Brief Narrative / Interim history: 84 year old female with history of DM-2, GERD, HTN, morbid obesity, hyperlipidemia, diverticulosis, hyperparathyroidism, anxiety disorder and bronchiectasis; admitted on 05/01/2019, presented with complaint of nausea and vomiting, was found to have gastric outlet obstruction concerning for malignancy. Had EGD by gastroenterology on 05/03/2019 revealed a esophagitis, nonbleeding erosive gastropathy, gastric ulcer with a clean ulcer base, multiple gastric polyps, acquired duodenal stenosis and mucosal change in the duodenum.  Biopsy suggested scarring from chronic inflammatory changes but negative for malignancy or dysplasia.  Underwent laparoscopic gastrojejunostomy and placement of feeding gastrojejunostomy tube by Dr. Johney Maine on 05/06/2019.  Subjective: No major events overnight of this morning.  Had laparoscopic surgery yesterday as above.  Feeding started.  Has mild abdominal pain this morning.  About 200 cc greenish fluid from NG tube in the last 2 hours.  Denies nausea or emesis.  Reports passing some gases.  Denies chest pain or dyspnea.  No GU symptoms.  Objective: Vitals:   05/07/19 0142 05/07/19 0553 05/07/19 0946 05/07/19 1353  BP: (!) 115/57 (!) 118/58 (!) 116/58 (!) 102/32  Pulse: 70 72 70 76  Resp: 20 20    Temp: 97.9 F (36.6 C) 97.6 F (36.4 C) 98 F (36.7 C) 98 F (36.7 C)  TempSrc: Oral Oral Oral Oral  SpO2: 92% 97% 100% 97%  Weight:  106.5 kg    Height:        Intake/Output Summary (Last 24 hours) at 05/07/2019 1420 Last data filed at 05/07/2019 1404 Gross per 24 hour  Intake 3067.89 ml  Output 1625 ml  Net 1442.89 ml   Filed Weights   05/01/19 2238 05/05/19 1445 05/07/19 0553  Weight: 94.6 kg 99.6 kg 106.5 kg    Examination:  GENERAL: No acute distress.  Lying in bed comfortably. HEENT:  MMM.  Vision and hearing grossly intact.  NG tube to wall. NECK: Supple.  No apparent JVD but difficult exam. RESP:  No IWOB. Good air movement bilaterally. CVS:  RRR. Heart sounds normal.  ABD/GI/GU: Bowel sounds present. Soft.  Mild diffuse tenderness.  GJ tube in place. MSK/EXT:  Moves extremities. No apparent deformity or edema.  SKIN: no apparent skin lesion or wound NEURO: Awake, alert and oriented appropriately.  No gross deficit.  PSYCH: Calm. Normal affect.    I have personally reviewed the following labs and images:  Radiology Studies: No results found.  Microbiology: Recent Results (from the past 240 hour(s))  SARS Coronavirus 2 (CEPHEID - Performed in [place] hospital lab), Hosp Order     Status: None   Collection Time: 05/01/19  3:46 PM   Specimen: Nasopharyngeal Swab  Result Value Ref Range Status   SARS Coronavirus 2 NEGATIVE NEGATIVE Final    Comment: (NOTE) If result is NEGATIVE SARS-CoV-2 target nucleic acids are NOT DETECTED. The SARS-CoV-2 RNA is generally detectable in upper and lower  respiratory specimens during the acute phase of infection. The lowest  concentration of SARS-CoV-2 viral copies this assay can detect is 250  copies / mL. A negative result does not preclude SARS-CoV-2 infection  and should not be used as the sole basis for treatment or other  patient management decisions.  A negative result may occur with  improper specimen collection / handling, submission of specimen other  than nasopharyngeal swab, presence of viral mutation(s) within the  areas targeted  by this assay, and inadequate number of viral copies  (<250 copies / mL). A negative result must be combined with clinical  observations, patient history, and epidemiological information. If result is POSITIVE SARS-CoV-2 target nucleic acids are DETECTED. The SARS-CoV-2 RNA is generally detectable in upper and lower  respiratory specimens dur ing the acute phase of infection.   Positive  results are indicative of active infection with SARS-CoV-2.  Clinical  correlation with patient history and other diagnostic information is  necessary to determine patient infection status.  Positive results do  not rule out bacterial infection or co-infection with other viruses. If result is PRESUMPTIVE POSTIVE SARS-CoV-2 nucleic acids MAY BE PRESENT.   A presumptive positive result was obtained on the submitted specimen  and confirmed on repeat testing.  While 2019 novel coronavirus  (SARS-CoV-2) nucleic acids may be present in the submitted sample  additional confirmatory testing may be necessary for epidemiological  and / or clinical management purposes  to differentiate between  SARS-CoV-2 and other Sarbecovirus currently known to infect humans.  If clinically indicated additional testing with an alternate test  methodology [phone]) is advised. The SARS-CoV-2 RNA is generally  detectable in upper and lower respiratory sp ecimens during the acute  phase of infection. The expected result is Negative. Fact Sheet for Patients:  StrictlyIdeas.no Fact Sheet for Healthcare Providers: BankingDealers.co.za This test is not yet approved or cleared by the [place] FDA and has been authorized for detection and/or diagnosis of SARS-CoV-2 by FDA under an Emergency Use Authorization (EUA).  This EUA will remain in effect (meaning this test can be used) for the duration of the COVID-19 declaration under Section 564(b)(1) of the Act, 21 U.S.C. section 360bbb-3(b)(1), unless the authorization is terminated or revoked sooner. Performed at [facility], [place] [address] [place], [place] [address]     Sepsis Labs: Invalid input(s): PROCALCITONIN, LACTICIDVEN  Urine analysis:    Component Value Date/Time   COLORURINE YELLOW 05/02/2019 0832   APPEARANCEUR CLEAR 05/02/2019 0832   LABSPEC 1.017 05/02/2019 0832    PHURINE 5.0 05/02/2019 0832   GLUCOSEU NEGATIVE 05/02/2019 0832   HGBUR SMALL (A) 05/02/2019 0832   BILIRUBINUR NEGATIVE 05/02/2019 0832   KETONESUR NEGATIVE 05/02/2019 0832   PROTEINUR NEGATIVE 05/02/2019 0832   UROBILINOGEN 0.2 06/01/2010 0915   NITRITE NEGATIVE 05/02/2019 0832   LEUKOCYTESUR SMALL (A) 05/02/2019 0832    Anemia Panel: No results for input(s): VITAMINB12, FOLATE, FERRITIN, TIBC, IRON, RETICCTPCT in the last 72 hours.  Thyroid Function Tests: No results for input(s): TSH, T4TOTAL, FREET4, T3FREE, THYROIDAB in the last 72 hours.  Lipid Profile: Recent Labs    05/06/19 0533  TRIG 116    CBG: Recent Labs  Lab 05/06/19 1432 05/06/19 2312 05/07/19 0556  GLUCAP 160* 138* 107*    HbA1C: No results for input(s): HGBA1C in the last 72 hours.  BNP (last 3 results): No results for input(s): PROBNP in the last 8760 hours.  Cardiac Enzymes: No results for input(s): CKTOTAL, CKMB, CKMBINDEX, TROPONINI in the last 168 hours.  Coagulation Profile: No results for input(s): INR, PROTIME in the last 168 hours.  Liver Function Tests: Recent Labs  Lab 05/02/19 0303 05/03/19 6295 05/04/19 0345 05/06/19 0533 05/07/19 0419  AST 24 18 17 20  44*  ALT 20 16 16 18  32  ALKPHOS 84 78 80 94 101  BILITOT 0.4 0.3 0.6 0.3 0.2*  PROT 4.9* 4.6* 4.5* 4.8* 4.6*  ALBUMIN 2.4* 2.2* 2.1* 2.1* 2.1*  Recent Labs  Lab 05/01/19 1524  LIPASE 30   No results for input(s): AMMONIA in the last 168 hours.  Basic Metabolic Panel: Recent Labs  Lab 05/01/19 1527  05/03/19 3536 05/04/19 0345 05/05/19 0414 05/06/19 0533 05/07/19 0419  NA  --    < > 138 140 141 141 139  K  --    < > 3.9 3.4* 3.5 3.6 4.2  CL  --    < > 107 113* 115* 116* 114*  CO2  --    < > 26 23 21* 21* 21*  GLUCOSE  --    < > 137* 120* 158* 133* 122*  BUN  --    < > 23 17 15 12 12   CREATININE  --    < > 1.49* 1.31* 1.14* 1.10* 1.21*  CALCIUM  --    < > 8.5* 8.2* 8.2* 8.7* 8.9  MG 2.1  --  1.8 1.8  --   2.0 1.9  PHOS  --   --   --   --   --  1.5* 2.3*   < > = values in this interval not displayed.   GFR: Estimated Creatinine Clearance: 42.6 mL/min (A) (by C-G formula based on SCr of 1.21 mg/dL (H)).  CBC: Recent Labs  Lab 05/02/19 0303 05/03/19 1443 05/04/19 0345 05/06/19 0533 05/07/19 0419  WBC 8.2 8.7 7.8 7.3 13.4*  NEUTROABS  --  5.7  --  5.0  --   HGB 12.4 11.5* 11.6* 11.8* 11.0*  HCT 40.1 37.8 39.2 39.5 35.6*  MCV 96.4 97.4 98.5 96.8 96.2  PLT 273 236 166 254 239    Procedures:  EGD on 05/03/2019 An esophagitis distally, nonbleeding erosive gastropathy, gastric ulcer with a clean ulcer base, multiple gastric polyps, acquired duodenal stenosis and mucosal change in the duodenum.  Biopsies suggest this chronic inflammatory change.  Negative for malignancy or dysplasia.  Microbiology summarized: [id number] negative.  Assessment & Plan: Acquired duodenal stenosis/gastric outlet obstruction -Reportedly not amenable to endoscopy.   -Pathology suggest this chronic inflammatory change.  Negative for malignancy or dysplasia. -No evidence of malignancy per pathology and general surgery. -Laparoscopic gastrojejunostomy with feeding GJ tube placement on 05/06/2019 by Dr. Johney Maine -NG tube discontinued 8/5 -Continue feeding tube per nutrition, and PPI. -GI recommended repeat EGD in 4 to 6 weeks with Dr. Silverio Decamp to reevaluate the duodenal stricture. -Closely monitor electrolytes and replenish aggressively.  Mild leukocytosis: Likely due to marginalization from surgery -Recheck in the morning  AKI: Likely due to GI loss from nausea and vomiting.  Resolved. -Monitor renal function  Hypophosphatemia: Likely due to malnutrition while n.p.o.  Improved. -Continue monitoring for refeeding syndrome  Hypertension: Normotensive  Hypertension: Normotensive -Hold home losartan and diuretics.  GERD: -Continue PPI  Hypothyroidism: -Continue IV Synthroid.  No crushed meds per J-tube, only  solution.  Primary hyperparathyroidism: Stable -On Reclast outpatient.  Obstructive sleep apnea -Nightly CPAP if she tolerates but difficult with NG tube.  Class II obesity: BMI 36.94 -Encourage lifestyle change to lose weight  DVT prophylaxis: Subcu heparin Code Status: Full code Family Communication: Patient and/or RN. Available if any question.  Disposition Plan: Remains inpatient to advance tube feeding to goal rate and monitor for refeeding syndrome. Consultants: GI, general surgery   Antimicrobials: Anti-infectives (From admission, onward)   Start     Dose/Rate Route Frequency Ordered Stop   05/06/19 2300  cefoTEtan (CEFOTAN) 2 g in sodium chloride 0.9 % 100 mL IVPB  2 g 200 mL/hr over 30 Minutes Intravenous Every 12 hours 05/06/19 1540 05/06/19 2255   05/06/19 2200  erythromycin ethylsuccinate (EES) 200 MG/5ML suspension 400 mg     400 mg Oral Every 8 hours 05/06/19 1606     05/06/19 1600  erythromycin (EES) 400 MG/5ML suspension 400 mg  Status:  Discontinued     400 mg Per Tube 3 times daily 05/06/19 1540 05/06/19 1605   05/06/19 0815  cefoTEtan (CEFOTAN) 2 g in sodium chloride 0.9 % 100 mL IVPB     2 g 200 mL/hr over 30 Minutes Intravenous On call to O.R. 05/06/19 0808 05/06/19 1105      Sch Meds:  Scheduled Meds: . bupivacaine liposome  20 mL Infiltration Once  . chlorhexidine  15 mL Mouth Rinse BID  . erythromycin ethylsuccinate  400 mg Oral Q8H  . feeding supplement (OSMOLITE 1.5 CAL)  1,000 mL Per Tube Q24H  . levothyroxine  25 mcg Intravenous Daily  . lip balm  1 application Topical BID  . mouth rinse  15 mL Mouth Rinse q12n4p  . pantoprazole (PROTONIX) IV  40 mg Intravenous Q12H  . sertraline  50 mg Per Tube Daily   Continuous Infusions: . sodium chloride Stopped (05/05/19 1505)  . dextrose 5 % and 0.9% NaCl 60 mL/hr at 05/07/19 0753  . methocarbamol (ROBAXIN) IV     PRN Meds:.sodium chloride, acetaminophen, HYDROcodone-acetaminophen, magic  mouthwash, menthol-cetylpyridinium, methocarbamol (ROBAXIN) IV, morphine injection, ondansetron **OR** ondansetron (ZOFRAN) IV, phenol, sodium chloride flush, sodium chloride flush   Javis Abboud T. J[place]  If 7PM-7AM, please contact night-coverage [email or web address] Password [email or web address] 05/07/2019, 2:20 PM

## 2019-05-07 NOTE — Progress Notes (Signed)
[facility]/Trauma Progress Note  1 Day Post-Op   Assessment/Plan HTN T2DM - diet controlled AKI - Cr 1.14 Hypothyroidism Hyperparathyroidism GERD OSA Obesity - BMI 36.94  Gastric Outlet Obstruction from narrowing at D1/D2 - s/p EGD 8/1 Dr. Rush [facility] - malignant appearing stricture at D1/D2 - biopsies pending  - prealbumin <5 - S/P laparoscopic gastrojejunostomy, feeding gastrojejunostomy tube, Dr. Johney Maine, 08/04 - J tube for TF's, 69mL/hr to start and increase 72mL every 24hrs until at goal - NO CRUSHED MEDS PER J TUBE, SOLUTIONS ONLY - DC NGT, G tube to gravity - added hycet per J tube for pain  FEN: NPO, IVF, ice chips okay VTE: SCDs, SQ heparin ID: no current abx, afeb, no leukocytosis Foley: none Follow up: Dr. Johney Maine    LOS: 6 days    Subjective: CC: abdominal soreness  Pt states no issues overnight. Nurse at bedside. Discussed no crushed meds per J tube, TF rate and DC NGT. Pt is having flatus.   Objective: Vital signs in last 24 hours: Temp:  [97.6 F (36.4 C)-98.4 F (36.9 C)] 97.6 F (36.4 C) (08/05 0553) Pulse Rate:  [68-90] 72 (08/05 0553) Resp:  [14-20] 20 (08/05 0553) BP: (115-150)/(38-77) 118/58 (08/05 0553) SpO2:  [92 %-100 %] 97 % (08/05 0553) Weight:  [106.5 kg] 106.5 kg (08/05 0553) Last BM Date: 05/01/19  Intake/Output from previous day: 08/04 0701 - 08/05 0700 In: 3720.3 [P.O.:60; I.V.:2158.6; NG/GT:571.7; IV Piggyback:930.1] Out: 1600 [Urine:750; Emesis/NG output:650; Drains:150; Blood:50] Intake/Output this shift: Total I/O In: 120 [P.O.:120] Out: -   PE: Gen:  Alert, NAD, pleasant, cooperative HEENT: NGT in place, bilious output Pulm:  Rate and effort normal Abd: Soft, obese, ND, +BS, incisions C/D/I, GJ tube in place and site C/D/I, mild generalized TTP without guarding, no peritonitis  Skin: no rashes noted, warm and dry   Anti-infectives: Anti-infectives (From admission, onward)   Start     Dose/Rate Route  Frequency Ordered Stop   05/06/19 2300  cefoTEtan (CEFOTAN) 2 g in sodium chloride 0.9 % 100 mL IVPB     2 g 200 mL/hr over 30 Minutes Intravenous Every 12 hours 05/06/19 1540 05/06/19 2255   05/06/19 2200  erythromycin ethylsuccinate (EES) 200 MG/5ML suspension 400 mg     400 mg Oral Every 8 hours 05/06/19 1606     05/06/19 1600  erythromycin (EES) 400 MG/5ML suspension 400 mg  Status:  Discontinued     400 mg Per Tube 3 times daily 05/06/19 1540 05/06/19 1605   05/06/19 0815  cefoTEtan (CEFOTAN) 2 g in sodium chloride 0.9 % 100 mL IVPB     2 g 200 mL/hr over 30 Minutes Intravenous On call to O.R. 05/06/19 0808 05/06/19 1105      Lab Results:  Recent Labs    05/06/19 0533 05/07/19 0419  WBC 7.3 13.4*  HGB 11.8* 11.0*  HCT 39.5 35.6*  PLT 254 239   BMET Recent Labs    05/06/19 0533 05/07/19 0419  NA 141 139  K 3.6 4.2  CL 116* 114*  CO2 21* 21*  GLUCOSE 133* 122*  BUN 12 12  CREATININE 1.10* 1.21*  CALCIUM 8.7* 8.9   PT/INR No results for input(s): LABPROT, INR in the last 72 hours. CMP     Component Value Date/Time   NA 139 05/07/2019 0419   K 4.2 05/07/2019 0419   CL 114 (H) 05/07/2019 0419   CO2 21 (L) 05/07/2019 0419   GLUCOSE 122 (H) 05/07/2019 7793  BUN 12 05/07/2019 0419   CREATININE 1.21 (H) 05/07/2019 0419   CREATININE 1.09 (H) 08/04/2015 1346   CALCIUM 8.9 05/07/2019 0419   PROT 4.6 (L) 05/07/2019 0419   PROT 6.5 03/17/2014 1348   ALBUMIN 2.1 (L) 05/07/2019 0419   AST 44 (H) 05/07/2019 0419   ALT 32 05/07/2019 0419   ALKPHOS 101 05/07/2019 0419   BILITOT 0.2 (L) 05/07/2019 0419   GFRNONAA 42 (L) 05/07/2019 0419   GFRAA 49 (L) 05/07/2019 0419   Lipase     Component Value Date/Time   LIPASE 30 05/01/2019 1524    Studies/Results: [place] Ekg Site Rite  Result Date: 05/05/2019 If Site Rite image not attached, placement could not be confirmed due to current cardiac rhythm.     Kalman Drape , [facility] 05/07/2019, 8:54  AM  Pager: [phone] Mon-Wed, Friday 7:00am-4:30pm Thurs 7am-11:30am  Consults: [phone]

## 2019-05-07 NOTE — Plan of Care (Signed)
progressing 

## 2019-05-07 NOTE — Anesthesia Postprocedure Evaluation (Signed)
Anesthesia Post Note  Patient: [name]  Procedure(s) Performed: LAPAROSCOPIC GASTROJEJUNOSTOMY and feedling gastrojejunostomy tube (N/A Abdomen)     Patient location during evaluation: PACU Anesthesia Type: General Level of consciousness: awake and sedated Pain management: pain level controlled Vital Signs Assessment: post-procedure vital signs reviewed and stable Respiratory status: spontaneous breathing Cardiovascular status: stable Postop Assessment: no apparent nausea or vomiting Anesthetic complications: no    Last Vitals:  Vitals:   05/07/19 0142 05/07/19 0553  BP: (!) 115/57 (!) 118/58  Pulse: 70 72  Resp: 20 20  Temp: 36.6 C 36.4 C  SpO2: 92% 97%    Last Pain:  Vitals:   05/07/19 0553  TempSrc: Oral  PainSc:    Pain Goal: Patients Stated Pain Goal: 2 (05/06/19 2229)                 Huston Foley

## 2019-05-08 LAB — COMPREHENSIVE METABOLIC PANEL
ALT: 25 U/L (ref 0–44)
AST: 23 U/L (ref 15–41)
Albumin: 2 g/dL — ABNORMAL LOW (ref 3.5–5.0)
Alkaline Phosphatase: 91 U/L (ref 38–126)
Anion gap: 2 — ABNORMAL LOW (ref 5–15)
BUN: 13 mg/dL (ref 8–23)
CO2: 24 mmol/L (ref 22–32)
Calcium: 9.1 mg/dL (ref 8.9–10.3)
Chloride: 115 mmol/L — ABNORMAL HIGH (ref 98–111)
Creatinine, Ser: 1.18 mg/dL — ABNORMAL HIGH (ref 0.44–1.00)
GFR calc Af Amer: 50 mL/min — ABNORMAL LOW (ref >60)
GFR calc non Af Amer: 43 mL/min — ABNORMAL LOW (ref >60)
Glucose, Bld: 135 mg/dL — ABNORMAL HIGH (ref 70–99)
Potassium: 3.8 mmol/L (ref 3.5–5.1)
Sodium: 141 mmol/L (ref 135–145)
Total Bilirubin: 0.3 mg/dL (ref 0.3–1.2)
Total Protein: 4.4 g/dL — ABNORMAL LOW (ref 6.5–8.1)

## 2019-05-08 LAB — GLUCOSE, CAPILLARY
Glucose-Capillary: 109 mg/dL — ABNORMAL HIGH (ref 70–99)
Glucose-Capillary: 105 mg/dL — ABNORMAL HIGH (ref 70–99)
Glucose-Capillary: 110 mg/dL — ABNORMAL HIGH (ref 70–99)
Glucose-Capillary: 123 mg/dL — ABNORMAL HIGH (ref 70–99)

## 2019-05-08 LAB — CBC
HCT: 35 % — ABNORMAL LOW (ref 36.0–46.0)
Hemoglobin: 10.7 g/dL — ABNORMAL LOW (ref 12.0–15.0)
MCH: 29.6 pg (ref 26.0–34.0)
MCHC: 30.6 g/dL (ref 30.0–36.0)
MCV: 97 fL (ref 80.0–100.0)
Platelets: 254 10*3/uL (ref 150–400)
RBC: 3.61 MIL/uL — ABNORMAL LOW (ref 3.87–5.11)
RDW: 15 % (ref 11.5–15.5)
WBC: 9.4 10*3/uL (ref 4.0–10.5)
nRBC: 0 % (ref 0.0–0.2)

## 2019-05-08 LAB — PHOSPHORUS: Phosphorus: 1.9 mg/dL — ABNORMAL LOW (ref 2.5–4.6)

## 2019-05-08 LAB — MAGNESIUM: Magnesium: 1.9 mg/dL (ref 1.7–2.4)

## 2019-05-08 MED ORDER — METOCLOPRAMIDE HCL 5 MG/ML IJ SOLN
10.0000 mg | Freq: Four times a day (QID) | INTRAMUSCULAR | Status: AC
  Administered 2019-05-08 – 2019-05-10 (×8): 10 mg via INTRAVENOUS
  Filled 2019-05-08 (×6): qty 2

## 2019-05-08 MED ORDER — OSMOLITE 1.5 CAL PO LIQD
1000.0000 mL | ORAL | Status: DC
  Administered 2019-05-08: 1000 mL
  Filled 2019-05-08: qty 1000

## 2019-05-08 MED ORDER — ERYTHROMYCIN ETHYLSUCCINATE 200 MG/5ML PO SUSR
400.0000 mg | Freq: Three times a day (TID) | ORAL | Status: DC
  Administered 2019-05-08 – 2019-05-11 (×7): 400 mg via JEJUNOSTOMY
  Filled 2019-05-08 (×11): qty 10

## 2019-05-08 NOTE — Progress Notes (Signed)
PROGRESS NOTE  [name] HEN:[id number] DOB: [birth year]   PCP: Burnard Bunting, MD  Patient is from: Home  DOA: 05/01/2019 LOS: 7  Brief Narrative / Interim history: 84 year old female with history of DM-2, GERD, HTN, morbid obesity, hyperlipidemia, diverticulosis, hyperparathyroidism, anxiety disorder and bronchiectasis; admitted on 05/01/2019, presented with complaint of nausea and vomiting, was found to have gastric outlet obstruction concerning for malignancy. Had EGD by gastroenterology on 05/03/2019 revealed a esophagitis, nonbleeding erosive gastropathy, gastric ulcer with a clean ulcer base, multiple gastric polyps, acquired duodenal stenosis and mucosal change in the duodenum.  Biopsy suggested scarring from chronic inflammatory changes but negative for malignancy or dysplasia.  Underwent laparoscopic gastrojejunostomy and placement of feeding gastrojejunostomy tube by Dr. Johney Maine on 05/06/2019.  Subjective: No major events overnight of this morning.  Had a good night.  Sleepy this morning but arouses easily to voice.  She denies nausea, vomiting or abdominal pain.  Denies chest pain, dyspnea or GU symptoms.  Is having some flatus.  No BM yet.  Objective: Vitals:   05/07/19 0946 05/07/19 1353 05/07/19 2111 05/08/19 0602  BP: (!) 116/58 (!) 102/32 (!) 146/67 132/82  Pulse: 70 76 79 74  Resp:   20 20  Temp: 98 F (36.7 C) 98 F (36.7 C) 98 F (36.7 C) 98.1 F (36.7 C)  TempSrc: Oral Oral Oral Oral  SpO2: 100% 97%  (!) 55%  Weight:    108.7 kg  Height:        Intake/Output Summary (Last 24 hours) at 05/08/2019 1419 Last data filed at 05/08/2019 1400 Gross per 24 hour  Intake 2966.83 ml  Output 1650 ml  Net 1316.83 ml   Filed Weights   05/05/19 1445 05/07/19 0553 05/08/19 0602  Weight: 99.6 kg 106.5 kg 108.7 kg    Examination:  GENERAL: No acute distress.  Appears well.  HEENT: MMM.  Vision and hearing grossly intact.  NECK: Supple.  No apparent JVD.  RESP:  No  IWOB. Good air movement bilaterally. CVS:  RRR. Heart sounds normal.  ABD/GI/GU: Bowel sounds diminished.  Soft.  Mild tenderness to palpation over LUQ and LLQ.  Tube feed in [place] tube. MSK/EXT:  Moves extremities. No apparent deformity or edema.  SKIN: no apparent skin lesion or wound NEURO: Sleepy but arises easily and responds to question appropriately.  No gross deficit.  PSYCH: Calm. Normal affect.   I have personally reviewed the following labs and images:  Radiology Studies: No results found.  Microbiology: Recent Results (from the past 240 hour(s))  SARS Coronavirus 2 (CEPHEID - Performed in [place] hospital lab), Hosp Order     Status: None   Collection Time: 05/01/19  3:46 PM   Specimen: Nasopharyngeal Swab  Result Value Ref Range Status   SARS Coronavirus 2 NEGATIVE NEGATIVE Final    Comment: (NOTE) If result is NEGATIVE SARS-CoV-2 target nucleic acids are NOT DETECTED. The SARS-CoV-2 RNA is generally detectable in upper and lower  respiratory specimens during the acute phase of infection. The lowest  concentration of SARS-CoV-2 viral copies this assay can detect is 250  copies / mL. A negative result does not preclude SARS-CoV-2 infection  and should not be used as the sole basis for treatment or other  patient management decisions.  A negative result may occur with  improper specimen collection / handling, submission of specimen other  than nasopharyngeal swab, presence of viral mutation(s) within the  areas targeted by this assay, and inadequate number of viral copies  (<  250 copies / mL). A negative result must be combined with clinical  observations, patient history, and epidemiological information. If result is POSITIVE SARS-CoV-2 target nucleic acids are DETECTED. The SARS-CoV-2 RNA is generally detectable in upper and lower  respiratory specimens dur ing the acute phase of infection.  Positive  results are indicative of active infection with SARS-CoV-2.   Clinical  correlation with patient history and other diagnostic information is  necessary to determine patient infection status.  Positive results do  not rule out bacterial infection or co-infection with other viruses. If result is PRESUMPTIVE POSTIVE SARS-CoV-2 nucleic acids MAY BE PRESENT.   A presumptive positive result was obtained on the submitted specimen  and confirmed on repeat testing.  While 2019 novel coronavirus  (SARS-CoV-2) nucleic acids may be present in the submitted sample  additional confirmatory testing may be necessary for epidemiological  and / or clinical management purposes  to differentiate between  SARS-CoV-2 and other Sarbecovirus currently known to infect humans.  If clinically indicated additional testing with an alternate test  methodology [phone]) is advised. The SARS-CoV-2 RNA is generally  detectable in upper and lower respiratory sp ecimens during the acute  phase of infection. The expected result is Negative. Fact Sheet for Patients:  StrictlyIdeas.no Fact Sheet for Healthcare Providers: BankingDealers.co.za This test is not yet approved or cleared by the [place] FDA and has been authorized for detection and/or diagnosis of SARS-CoV-2 by FDA under an Emergency Use Authorization (EUA).  This EUA will remain in effect (meaning this test can be used) for the duration of the COVID-19 declaration under Section 564(b)(1) of the Act, 21 U.S.C. section 360bbb-3(b)(1), unless the authorization is terminated or revoked sooner. Performed at [facility], [place] [address], [place], [place] [address]     Sepsis Labs: Invalid input(s): PROCALCITONIN, LACTICIDVEN  Urine analysis:    Component Value Date/Time   COLORURINE YELLOW 05/02/2019 0832   APPEARANCEUR CLEAR 05/02/2019 0832   LABSPEC 1.017 05/02/2019 0832   PHURINE 5.0 05/02/2019 0832   GLUCOSEU NEGATIVE 05/02/2019 0832   HGBUR  SMALL (A) 05/02/2019 0832   BILIRUBINUR NEGATIVE 05/02/2019 0832   KETONESUR NEGATIVE 05/02/2019 0832   PROTEINUR NEGATIVE 05/02/2019 0832   UROBILINOGEN 0.2 06/01/2010 0915   NITRITE NEGATIVE 05/02/2019 0832   LEUKOCYTESUR SMALL (A) 05/02/2019 0832    Anemia Panel: No results for input(s): VITAMINB12, FOLATE, FERRITIN, TIBC, IRON, RETICCTPCT in the last 72 hours.  Thyroid Function Tests: No results for input(s): TSH, T4TOTAL, FREET4, T3FREE, THYROIDAB in the last 72 hours.  Lipid Profile: Recent Labs    05/06/19 0533  TRIG 116    CBG: Recent Labs  Lab 05/07/19 1951 05/07/19 2346 05/08/19 0421 05/08/19 0602 05/08/19 0728  GLUCAP 116* 113* 109* 105* 110*    HbA1C: No results for input(s): HGBA1C in the last 72 hours.  BNP (last 3 results): No results for input(s): PROBNP in the last 8760 hours.  Cardiac Enzymes: No results for input(s): CKTOTAL, CKMB, CKMBINDEX, TROPONINI in the last 168 hours.  Coagulation Profile: No results for input(s): INR, PROTIME in the last 168 hours.  Liver Function Tests: Recent Labs  Lab 05/03/19 2458 05/04/19 0345 05/06/19 0533 05/07/19 0419 05/08/19 0322  AST 18 17 20  44* 23  ALT 16 16 18  32 25  ALKPHOS 78 80 94 101 91  BILITOT 0.3 0.6 0.3 0.2* 0.3  PROT 4.6* 4.5* 4.8* 4.6* 4.4*  ALBUMIN 2.2* 2.1* 2.1* 2.1* 2.0*   Recent Labs  Lab  05/01/19 1524  LIPASE 30   No results for input(s): AMMONIA in the last 168 hours.  Basic Metabolic Panel: Recent Labs  Lab 05/03/19 0638 05/04/19 0345 05/05/19 0414 05/06/19 0533 05/07/19 0419 05/08/19 0322  NA 138 140 141 141 139 141  K 3.9 3.4* 3.5 3.6 4.2 3.8  CL 107 113* 115* 116* 114* 115*  CO2 26 23 21* 21* 21* 24  GLUCOSE 137* 120* 158* 133* 122* 135*  BUN 23 17 15 12 12 13   CREATININE 1.49* 1.31* 1.14* 1.10* 1.21* 1.18*  CALCIUM 8.5* 8.2* 8.2* 8.7* 8.9 9.1  MG 1.8 1.8  --  2.0 1.9 1.9  PHOS  --   --   --  1.5* 2.3* 1.9*   GFR: Estimated Creatinine Clearance: 44.2  mL/min (A) (by C-G formula based on SCr of 1.18 mg/dL (H)).  CBC: Recent Labs  Lab 05/03/19 0638 05/04/19 0345 05/06/19 0533 05/07/19 0419 05/08/19 0322  WBC 8.7 7.8 7.3 13.4* 9.4  NEUTROABS 5.7  --  5.0  --   --   HGB 11.5* 11.6* 11.8* 11.0* 10.7*  HCT 37.8 39.2 39.5 35.6* 35.0*  MCV 97.4 98.5 96.8 96.2 97.0  PLT 236 166 254 239 254    Procedures:  EGD on 05/03/2019 An esophagitis distally, nonbleeding erosive gastropathy, gastric ulcer with a clean ulcer base, multiple gastric polyps, acquired duodenal stenosis and mucosal change in the duodenum.  Biopsies suggest this chronic inflammatory change.  Negative for malignancy or dysplasia.  Microbiology summarized: [id number] negative.  Assessment & Plan: Acquired duodenal stenosis/gastric outlet obstruction -Reportedly not amenable to endoscopy.   -Pathology suggest this chronic inflammatory change.  Negative for malignancy or dysplasia. -No evidence of malignancy per pathology and general surgery. -Laparoscopic gastrojejunostomy with feeding GJ tube placement on 05/06/2019 by Dr. Johney Maine -NG tube discontinued 8/5 -Continue feeding tube per nutrition, and PPI. -GI recommended repeat EGD in 4 to 6 weeks with Dr. Silverio Decamp to reevaluate the duodenal stricture. -Continue tube feed.  Currently at 30 cc/h. -Closely monitor electrolytes and replenish aggressively. -PT/OT eval.  Mild leukocytosis: Likely due to marginalization from surgery.  Resolved. -Recheck in the morning  AKI: Likely due to GI loss from nausea and vomiting.  Resolved. -Monitor renal function  Hypophosphatemia: Likely due to malnutrition while n.p.o.  Improved. -Continue monitoring for refeeding syndrome  Hypertension: Normotensive -Hold home losartan and diuretics.  GERD: -Continue PPI  Hypothyroidism: -Continue IV Synthroid.  No crushed meds per J-tube, only solution.  Primary hyperparathyroidism: Stable -On Reclast outpatient.  Obstructive sleep apnea  -Nightly CPAP if she tolerates but difficult with NG tube.  Class II obesity: BMI 36.94 -Encourage lifestyle change to lose weight  Nutrition: Tube feed -Continue current rate at 30 cc/h.  Goal is 50 cc/h.  She had about 1.3 L output from [place] tube in the last 24 hours.  So, won't increase rate at this time. DVT prophylaxis: Subcu heparin Code Status: Full code Family Communication: Patient and/or RN. Available if any question.  Disposition Plan: Remains inpatient until tube feed at goal rate. Consultants: GI, general surgery   Antimicrobials: Anti-infectives (From admission, onward)   Start     Dose/Rate Route Frequency Ordered Stop   05/06/19 2300  cefoTEtan (CEFOTAN) 2 g in sodium chloride 0.9 % 100 mL IVPB     2 g 200 mL/hr over 30 Minutes Intravenous Every 12 hours 05/06/19 1540 05/06/19 2255   05/06/19 2200  erythromycin ethylsuccinate (EES) 200 MG/5ML suspension 400 mg  400 mg Oral Every 8 hours 05/06/19 1606     05/06/19 1600  erythromycin (EES) 400 MG/5ML suspension 400 mg  Status:  Discontinued     400 mg Per Tube 3 times daily 05/06/19 1540 05/06/19 1605   05/06/19 0815  cefoTEtan (CEFOTAN) 2 g in sodium chloride 0.9 % 100 mL IVPB     2 g 200 mL/hr over 30 Minutes Intravenous On call to O.R. 05/06/19 0808 05/06/19 1105      Sch Meds:  Scheduled Meds: . bupivacaine liposome  20 mL Infiltration Once  . chlorhexidine  15 mL Mouth Rinse BID  . erythromycin ethylsuccinate  400 mg Oral Q8H  . feeding supplement (OSMOLITE 1.5 CAL)  1,000 mL Per Tube Q24H  . levothyroxine  25 mcg Intravenous Daily  . lip balm  1 application Topical BID  . mouth rinse  15 mL Mouth Rinse q12n4p  . metoCLOPramide (REGLAN) injection  10 mg Intravenous Q6H  . pantoprazole (PROTONIX) IV  40 mg Intravenous Q12H  . sertraline  50 mg Per Tube Daily   Continuous Infusions: . sodium chloride Stopped (05/05/19 1505)  . dextrose 5 % and 0.9% NaCl 60 mL/hr at 05/07/19 2357  . methocarbamol  (ROBAXIN) IV     PRN Meds:.sodium chloride, acetaminophen, HYDROcodone-acetaminophen, magic mouthwash, menthol-cetylpyridinium, methocarbamol (ROBAXIN) IV, morphine injection, ondansetron **OR** ondansetron (ZOFRAN) IV, phenol, sodium chloride flush, sodium chloride flush    T. Weldon  If 7PM-7AM, please contact night-coverage [email or web address] Password [email or web address] 05/08/2019, 2:19 PM

## 2019-05-08 NOTE — Progress Notes (Addendum)
[facility]/Trauma Progress Note  2 Days Post-Op   Assessment/Plan HTN T2DM - diet controlled AKI - Cr 1.14 Hypothyroidism Hyperparathyroidism GERD OSA Obesity - BMI 36.94  Gastric Outlet Obstruction from narrowing at D1/D2 - s/p EGD 8/1 Dr. Rush Landmark - malignant appearing stricture at D1/D2 - biopsies were neg for dysplasia or malignancy   -prealbumin <5 - S/P laparoscopic gastrojejunostomy, feeding gastrojejunostomy tube, Dr. Johney Maine, 08/04 - J tube for TF's, stay at 77mL/hr for now, goal is 66 but there are TF's in G tube - NO CRUSHED MEDS PER J TUBE, SOLUTIONS ONLY - G tube to gravity - hycet per J tube for pain - monitor for ileus, reglan 10mg  q6hr per Dr. Johney Maine  FEN: NPO, IVF, ice chips okay VTE: SCDs, SQ heparin ID: no current abx, afeb, no leukocytosis Foley: none Follow up: Dr. Johney Maine   LOS: 7 days    Subjective: CC: abdominal soreness  Pt states no issues overnight. She is having some flatus. No BM. No nausea or vomiting.   Objective: Vital signs in last 24 hours: Temp:  [98 F (36.7 C)-98.1 F (36.7 C)] 98.1 F (36.7 C) (08/06 0602) Pulse Rate:  [70-79] 74 (08/06 0602) Resp:  [20] 20 (08/06 0602) BP: (102-146)/(32-82) 132/82 (08/06 0602) SpO2:  [55 %-100 %] 55 % (08/06 0602) Weight:  [108.7 kg] 108.7 kg (08/06 0602) Last BM Date: 05/01/19  Intake/Output from previous day: 08/05 0701 - 08/06 0700 In: 2925.3 [P.O.:840; I.V.:1131.3; NG/GT:949] Out: 1696 [Urine:300; Drains:1275] Intake/Output this shift: No intake/output data recorded.  PE: Gen:  Alert, NAD, pleasant, cooperative Pulm:  Rate and effort normal Abd: Soft, obese, ND, few BS, incisions C/D/I, GJ tube in place and site C/D/I, mild TTP of left hemiabdomen without guarding, no peritonitis  Skin: no rashes noted, warm and dry   Anti-infectives: Anti-infectives (From admission, onward)   Start     Dose/Rate Route Frequency Ordered Stop   05/06/19 2300  cefoTEtan  (CEFOTAN) 2 g in sodium chloride 0.9 % 100 mL IVPB     2 g 200 mL/hr over 30 Minutes Intravenous Every 12 hours 05/06/19 1540 05/06/19 2255   05/06/19 2200  erythromycin ethylsuccinate (EES) 200 MG/5ML suspension 400 mg     400 mg Oral Every 8 hours 05/06/19 1606     05/06/19 1600  erythromycin (EES) 400 MG/5ML suspension 400 mg  Status:  Discontinued     400 mg Per Tube 3 times daily 05/06/19 1540 05/06/19 1605   05/06/19 0815  cefoTEtan (CEFOTAN) 2 g in sodium chloride 0.9 % 100 mL IVPB     2 g 200 mL/hr over 30 Minutes Intravenous On call to O.R. 05/06/19 0808 05/06/19 1105      Lab Results:  Recent Labs    05/07/19 0419 05/08/19 0322  WBC 13.4* 9.4  HGB 11.0* 10.7*  HCT 35.6* 35.0*  PLT 239 254   BMET Recent Labs    05/07/19 0419 05/08/19 0322  NA 139 141  K 4.2 3.8  CL 114* 115*  CO2 21* 24  GLUCOSE 122* 135*  BUN 12 13  CREATININE 1.21* 1.18*  CALCIUM 8.9 9.1   PT/INR No results for input(s): LABPROT, INR in the last 72 hours. CMP     Component Value Date/Time   NA 141 05/08/2019 0322   K 3.8 05/08/2019 0322   CL 115 (H) 05/08/2019 0322   CO2 24 05/08/2019 0322   GLUCOSE 135 (H) 05/08/2019 0322   BUN 13 05/08/2019 0322  CREATININE 1.18 (H) 05/08/2019 0322   CREATININE 1.09 (H) 08/04/2015 1346   CALCIUM 9.1 05/08/2019 0322   PROT 4.4 (L) 05/08/2019 0322   PROT 6.5 03/17/2014 1348   ALBUMIN 2.0 (L) 05/08/2019 0322   AST 23 05/08/2019 0322   ALT 25 05/08/2019 0322   ALKPHOS 91 05/08/2019 0322   BILITOT 0.3 05/08/2019 0322   GFRNONAA 43 (L) 05/08/2019 0322   GFRAA 50 (L) 05/08/2019 0322   Lipase     Component Value Date/Time   LIPASE 30 05/01/2019 1524    Studies/Results: No results found.    Kalman Drape , [facility] Surgery 05/08/2019, 8:46 AM  Pager: [phone] Mon-Wed, Friday 7:00am-4:30pm Thurs 7am-11:30am  Consults: [phone]

## 2019-05-09 ENCOUNTER — Ambulatory Visit: Payer: Medicare Other | Admitting: Occupational Therapy

## 2019-05-09 ENCOUNTER — Ambulatory Visit: Payer: Medicare Other | Admitting: Rehabilitative and Restorative Service Providers"

## 2019-05-09 LAB — CBC
HCT: 33.5 % — ABNORMAL LOW (ref 36.0–46.0)
Hemoglobin: 10.1 g/dL — ABNORMAL LOW (ref 12.0–15.0)
MCH: 29.2 pg (ref 26.0–34.0)
MCHC: 30.1 g/dL (ref 30.0–36.0)
MCV: 96.8 fL (ref 80.0–100.0)
Platelets: 249 10*3/uL (ref 150–400)
RBC: 3.46 MIL/uL — ABNORMAL LOW (ref 3.87–5.11)
RDW: 15.4 % (ref 11.5–15.5)
WBC: 8.7 10*3/uL (ref 4.0–10.5)
nRBC: 0 % (ref 0.0–0.2)

## 2019-05-09 LAB — BASIC METABOLIC PANEL
Anion gap: 3 — ABNORMAL LOW (ref 5–15)
BUN: 14 mg/dL (ref 8–23)
CO2: 25 mmol/L (ref 22–32)
Calcium: 9 mg/dL (ref 8.9–10.3)
Chloride: 114 mmol/L — ABNORMAL HIGH (ref 98–111)
Creatinine, Ser: 1.01 mg/dL — ABNORMAL HIGH (ref 0.44–1.00)
GFR calc Af Amer: >60 mL/min (ref >60)
GFR calc non Af Amer: 52 mL/min — ABNORMAL LOW (ref >60)
Glucose, Bld: 136 mg/dL — ABNORMAL HIGH (ref 70–99)
Potassium: 4 mmol/L (ref 3.5–5.1)
Sodium: 142 mmol/L (ref 135–145)

## 2019-05-09 LAB — GLUCOSE, CAPILLARY
Glucose-Capillary: 106 mg/dL — ABNORMAL HIGH (ref 70–99)
Glucose-Capillary: 107 mg/dL — ABNORMAL HIGH (ref 70–99)
Glucose-Capillary: 116 mg/dL — ABNORMAL HIGH (ref 70–99)
Glucose-Capillary: 129 mg/dL — ABNORMAL HIGH (ref 70–99)
Glucose-Capillary: 135 mg/dL — ABNORMAL HIGH (ref 70–99)

## 2019-05-09 LAB — PHOSPHORUS: Phosphorus: 1.9 mg/dL — ABNORMAL LOW (ref 2.5–4.6)

## 2019-05-09 LAB — MAGNESIUM: Magnesium: 1.8 mg/dL (ref 1.7–2.4)

## 2019-05-09 MED ORDER — OSMOLITE 1.5 CAL PO LIQD
1000.0000 mL | ORAL | Status: DC
  Administered 2019-05-09: 1000 mL
  Filled 2019-05-09: qty 1000

## 2019-05-09 NOTE — Progress Notes (Addendum)
[facility]/Trauma Progress Note  3 Days Post-Op   Assessment/Plan HTN T2DM - diet controlled AKI - Cr 1.14 Hypothyroidism Hyperparathyroidism GERD OSA Obesity - BMI 36.94  Gastric Outlet Obstruction from narrowing at D1/D2 - s/p EGD 8/1 Dr. Rush Landmark - malignant appearing stricture at D1/D2 - biopsies were neg for dysplasia or malignancy   -prealbumin<5 - S/P laparoscopic gastrojejunostomy, feeding gastrojejunostomy tube, Dr. Johney Maine, 08/04 - J tube for TF's, 52mL/hr for now, goal is 28.  Advance to goal on 08/08 if patient tolerates and no tube feeds seen in G-tube - NO CRUSHED MEDS PER J TUBE, SOLUTIONS ONLY - G tube to gravity - hycet per J tube for pain - monitor for ileus, reglan 10mg  q6hr per Dr. Johney Maine - Patient needs to mobilize   FEN: NPO, IVF, ice chips and sips okay VTE: SCDs, SQ heparin ID: no current abx, afeb, no leukocytosis Foley:none Follow up:Dr. Gross   LOS: 8 days    Subjective: CC: Abdominal pain  Patient states her abdomen is sore since surgery.  She has not been out of bed to walk.  She denies nausea or vomiting.  She is having flatus but no BMs.  She states she is too tired to ambulate but she has been walking to the bathroom.   Objective: Vital signs in last 24 hours: Temp:  [98.1 F (36.7 C)-98.5 F (36.9 C)] 98.1 F (36.7 C) (08/07 0518) Pulse Rate:  [77-82] 77 (08/07 0518) Resp:  [15-18] 15 (08/07 0518) BP: (119-131)/(50-97) 130/65 (08/07 0518) SpO2:  [93 %-100 %] 93 % (08/07 0518) Last BM Date: 05/01/19  Intake/Output from previous day: 08/06 0701 - 08/07 0700 In: 1941.3 [P.O.:180; I.V.:1214.8; NG/GT:546.5] Out: 1650 [Urine:750; Drains:900] Intake/Output this shift: No intake/output data recorded.  PE: Gen: Alert, NAD, pleasant, cooperative Pulm:Rate andeffort normal Abd: Soft,obese,ND, + BS, incisions C/D/I,GJ tube in place and site C/D/I, mild TTP of left hemiabdomen without guarding, no  peritonitis, G tube drainage is cloudy and bilious Skin: no rashes noted, warm and dry   Anti-infectives: Anti-infectives (From admission, onward)   Start     Dose/Rate Route Frequency Ordered Stop   05/08/19 2200  erythromycin ethylsuccinate (EES) 200 MG/5ML suspension 400 mg     400 mg Per J Tube Every 8 hours 05/08/19 1531     05/06/19 2300  cefoTEtan (CEFOTAN) 2 g in sodium chloride 0.9 % 100 mL IVPB     2 g 200 mL/hr over 30 Minutes Intravenous Every 12 hours 05/06/19 1540 05/06/19 2255   05/06/19 2200  erythromycin ethylsuccinate (EES) 200 MG/5ML suspension 400 mg  Status:  Discontinued     400 mg Oral Every 8 hours 05/06/19 1606 05/08/19 1531   05/06/19 1600  erythromycin (EES) 400 MG/5ML suspension 400 mg  Status:  Discontinued     400 mg Per Tube 3 times daily 05/06/19 1540 05/06/19 1605   05/06/19 0815  cefoTEtan (CEFOTAN) 2 g in sodium chloride 0.9 % 100 mL IVPB     2 g 200 mL/hr over 30 Minutes Intravenous On call to O.R. 05/06/19 0808 05/06/19 1105      Lab Results:  Recent Labs    05/08/19 0322 05/09/19 0343  WBC 9.4 8.7  HGB 10.7* 10.1*  HCT 35.0* 33.5*  PLT 254 249   BMET Recent Labs    05/08/19 0322 05/09/19 0343  NA 141 142  K 3.8 4.0  CL 115* 114*  CO2 24 25  GLUCOSE 135* 136*  BUN 13 14  CREATININE 1.18* 1.01*  CALCIUM 9.1 9.0   PT/INR No results for input(s): LABPROT, INR in the last 72 hours. CMP     Component Value Date/Time   NA 142 05/09/2019 0343   K 4.0 05/09/2019 0343   CL 114 (H) 05/09/2019 0343   CO2 25 05/09/2019 0343   GLUCOSE 136 (H) 05/09/2019 0343   BUN 14 05/09/2019 0343   CREATININE 1.01 (H) 05/09/2019 0343   CREATININE 1.09 (H) 08/04/2015 1346   CALCIUM 9.0 05/09/2019 0343   PROT 4.4 (L) 05/08/2019 0322   PROT 6.5 03/17/2014 1348   ALBUMIN 2.0 (L) 05/08/2019 0322   AST 23 05/08/2019 0322   ALT 25 05/08/2019 0322   ALKPHOS 91 05/08/2019 0322   BILITOT 0.3 05/08/2019 0322   GFRNONAA 52 (L) 05/09/2019 0343   GFRAA  >60 05/09/2019 0343   Lipase     Component Value Date/Time   LIPASE 30 05/01/2019 1524    Studies/Results: No results found.    Kalman Drape , [facility] Surgery 05/09/2019, 9:18 AM  Pager: [phone] Mon-Wed, Friday 7:00am-4:30pm Thurs 7am-11:30am  Consults: [phone]

## 2019-05-09 NOTE — Evaluation (Signed)
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 Johney Maine, 08/04.  PMH includes:  sleep apnea, osteoporosis, HTN, glaucoma, depression, DM   Clinical Impression   Pt admitted with above. She demonstrates the below listed deficits and will benefit from continued OT to maximize safety and independence with BADLs.  Pt presents to OT with generalized weakness, impaired balance, pain, decreased activity tolerance.  She requires significant encouragement to participate.  She requires set up to min A for UB ADLs, and mod - total A for LB ADLs; min A for bed mobility and functional transfers.   Pain 6/10.  She reports she lived with spouse and was mod I with ADLs PTA.  She reports spouse is supportive and able to assist at discharge.  Will follow acutely.       Follow Up Recommendations  Home health OT;Supervision/Assistance - 24 hour    Equipment Recommendations  3 in 1 bedside commode    Recommendations for Other Services       Precautions / Restrictions Precautions Precautions: Fall Precaution Comments: G tube, J tube      Mobility Bed Mobility Overal bed mobility: Needs Assistance Bed Mobility: Rolling;Sidelying to Sit;Sit to Sidelying Rolling: Min assist Sidelying to sit: Min assist     Sit to sidelying: Supervision General bed mobility comments: cues for log roll, and assist to roll, and lift trunk   Transfers Overall transfer level: Needs assistance Equipment used: Rolling walker (2 wheeled) Transfers: Sit to/from [facility] Sit to Stand: Min assist Stand pivot transfers: Min assist       General transfer comment: assist to move into standing, assist to steady, and for walker safety      Balance Overall balance assessment: Needs assistance Sitting-balance support: Bilateral upper extremity supported Sitting balance-Leahy Scale: Normal     Standing balance support: Bilateral upper extremity supported Standing balance-Leahy Scale: Poor Standing balance comment: reliant on UE support                            ADL either performed or assessed with clinical judgement   ADL Overall ADL's : Needs assistance/impaired Eating/Feeding: NPO   Grooming: Wash/dry hands;Wash/dry face;Oral care;Brushing hair;Set up;Sitting   Upper Body Bathing: Moderate assistance;Sitting   Lower Body Bathing: Maximal assistance;Sit to/from stand   Upper Body Dressing : Moderate assistance;Sitting   Lower Body Dressing: Total assistance;Sit to/from stand   Toilet Transfer: Minimal assistance;Stand-pivot;BSC;RW Armed forces technical officer Details (indicate cue type and reason): requires increased time  Toileting- Water quality scientist and Hygiene: Moderate assistance;Sit to/from stand Toileting - Clothing Manipulation Details (indicate cue type and reason): unable to access posterior peri area      Functional mobility during ADLs: Minimal assistance;Rolling walker       Vision         Perception     Praxis      Pertinent Vitals/Pain Pain Assessment: 0-10 Pain Score: 6  Pain Location: abdomen Pain Descriptors / Indicators: Guarding;Grimacing Pain Intervention(s): Monitored during session     Hand Dominance Right   Extremity/Trunk Assessment Upper Extremity Assessment Upper Extremity Assessment: Generalized weakness(pt tremulous )   Lower Extremity Assessment Lower Extremity Assessment: Generalized weakness       Communication Communication Communication: No difficulties  Cognition Arousal/Alertness: Awake/alert Behavior During Therapy: WFL for tasks assessed/performed Overall Cognitive Status: Within Functional Limits for tasks assessed                                      General Comments  Pt requires encouragement for participation     Exercises     Shoulder Instructions      Home Living Family/patient expects to be discharged to:: Private residence Living Arrangements: Spouse/significant other Available Help at Discharge: Family;Available 24 hours/day Type of Home: House       Home Layout: Two level Alternate Level Stairs-Number of Steps: flight   Bathroom Shower/Tub: Occupational psychologist: Standard     Home Equipment: Environmental consultant - 2 wheels;Shower seat   Additional Comments: pt reports renting bed for main level after last admission however no longer has this      Prior Functioning/Environment Level of Independence: Independent        Comments: Pt reports spouse assists intermittently with IADLs         OT Problem List: (abdomen )      OT Treatment/Interventions: Self-care/ADL training;DME and/or AE instruction;Therapeutic activities;Patient/family education;Balance training    OT Goals(Current goals can be found in the care plan section) Acute Rehab OT Goals Patient Stated Goal: to have less pain  OT Goal Formulation: With patient Time For Goal Achievement: 05/23/19 Potential to Achieve Goals: Good ADL Goals Pt Will Perform Grooming: with supervision;standing Pt Will Perform Upper Body Bathing: with set-up;sitting Pt Will Perform Lower Body Bathing: with supervision;with adaptive equipment;sit to/from stand Pt Will Perform Upper Body Dressing: with set-up;sitting Pt Will Perform Lower Body Dressing: with supervision;with adaptive equipment;sit to/from stand Pt Will Transfer to Toilet: with supervision;ambulating;regular height toilet Pt Will Perform Toileting - Clothing Manipulation and hygiene: with supervision;sit to/from stand  OT Frequency: Min 2X/week   Barriers to D/C:            Co-evaluation              AM-PAC OT "6 Clicks" Daily Activity     Outcome Measure Help  from another person eating meals?: None Help from another person taking care of personal grooming?: None Help from another person toileting, which includes using toliet, bedpan, or urinal?: A Lot Help from another person bathing (including washing, rinsing, drying)?: A Lot Help from another person to put on and taking off regular upper body clothing?: A Little Help from another person to put on and taking off regular lower body clothing?: Total 6 Click Score: 16   End of Session Equipment Utilized During Treatment: Rolling walker Nurse Communication: Mobility status  Activity Tolerance: Patient limited by fatigue;Patient limited by pain Patient left: in bed;with call bell/phone within reach;with bed alarm set  OT Visit Diagnosis: Pain Pain - part of body: (abdomen )                Time: 4627-0350 OT Time Calculation (min): 31 min Charges:  OT General Charges $OT Visit: 1 Visit OT Evaluation $OT Eval Moderate Complexity: 1 Mod OT Treatments $Self Care/Home Management : 8-22 mins  Lucille Passy, OTR/L Acute Rehabilitation Services Pager [phone] Office [phone]   Lucille Passy M 05/09/2019, 1:23 PM

## 2019-05-09 NOTE — Evaluation (Signed)
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 Johney Maine, 08/04  Clinical Impression  Pt admitted with above diagnosis. Pt currently with functional limitations due to the deficits listed below (see PT Problem List).  Pt will benefit from skilled PT to increase their independence and safety with mobility to allow discharge to the venue listed below.  Pt encouraged to mobilize and assisted OOB to recliner.  Pt unable to tolerate ambulating due to pain.  Pt also required increased time to perform due to pain.  RN notified of pt up to recliner and increased pain.  Pt reports renting bed to stay on main level upon previous admission.  Pt will likely progress to d/c home however needs to mobilizing more.     Follow Up Recommendations Home health PT;Supervision/Assistance - 24 hour    Equipment Recommendations  Hospital bed    Recommendations for Other Services       Precautions / Restrictions Precautions Precautions: Fall Precaution Comments: G tube, J tube      Mobility  Bed Mobility Overal bed mobility: Needs Assistance Bed Mobility: Rolling;Sidelying to Sit Rolling: Supervision Sidelying to sit: Min assist       General bed mobility comments: cues for log roll technique for pain control, assist for trunk upright; pt with increased pain and shaky UEs upon sitting; required increased time to sit before transferring to recliner  Transfers Overall transfer level: Needs assistance Equipment used: Rolling walker (2 wheeled) Transfers: Sit to/from [facility] Sit to Stand: Min assist Stand pivot transfers: Min guard       General transfer comment: verbal cues for technique, pain appears to be  limiting mobility, pt encouraged to get OOB to recliner however declined ambulating due to pain  Ambulation/Gait                Stairs            Wheelchair Mobility    Modified Rankin (Stroke Patients Only)       Balance Overall balance assessment: Needs assistance         Standing balance support: Bilateral upper extremity supported Standing balance-Leahy Scale: Poor                               Pertinent Vitals/Pain Pain Assessment: 0-10 Pain Score: 10-Worst pain ever Pain Location: abdomen Pain Descriptors / Indicators: Guarding;Grimacing Pain Intervention(s): Monitored during session;Patient requesting pain meds-RN notified;Repositioned    Home Living Family/patient expects to be discharged to:: Private residence Living Arrangements: Spouse/significant other   Type of Home: [place]: Two level Home Equipment: Environmental consultant - 2 wheels Additional Comments: pt reports renting bed for main level after last admission however no longer has this    Prior Function Level of Independence: Independent               Hand Dominance        Extremity/Trunk Assessment        Lower Extremity Assessment Lower Extremity Assessment: Generalized weakness       Communication   Communication: No difficulties  Cognition Arousal/Alertness: Awake/alert Behavior During Therapy: WFL for tasks assessed/performed Overall Cognitive Status: Within Functional Limits for tasks  assessed                                        General Comments      Exercises     Assessment/Plan    PT Assessment Patient needs continued PT services  PT Problem List Decreased strength;Decreased mobility;Decreased activity tolerance;Decreased balance;Decreased knowledge of use of DME;Pain       PT Treatment Interventions DME instruction;Gait training;Balance training;Therapeutic exercise;Functional mobility training;Therapeutic  activities;Patient/family education    PT Goals (Current goals can be found in the Care Plan section)  Acute Rehab PT Goals PT Goal Formulation: With patient Time For Goal Achievement: 05/23/19 Potential to Achieve Goals: Good    Frequency Min 3X/week   Barriers to discharge        Co-evaluation               AM-PAC PT "6 Clicks" Mobility  Outcome Measure Help needed turning from your back to your side while in a flat bed without using bedrails?: A Little Help needed moving from lying on your back to sitting on the side of a flat bed without using bedrails?: A Little Help needed moving to and from a bed to a chair (including a wheelchair)?: A Little Help needed standing up from a chair using your arms (e.g., wheelchair or bedside chair)?: A Little Help needed to walk in hospital room?: A Lot Help needed climbing 3-5 steps with a railing? : A Lot 6 Click Score: 16    End of Session   Activity Tolerance: Patient limited by pain Patient left: in chair;with call bell/phone within reach Nurse Communication: Patient requests pain meds PT Visit Diagnosis: Difficulty in walking, not elsewhere classified (R26.2)    Time: 4920-1007 PT Time Calculation (min) (ACUTE ONLY): 28 min   Charges:   PT Evaluation $PT Eval Low Complexity: 1 Low PT Treatments $Therapeutic Activity: 8-22 mins       Carmelia Bake, PT, DPT Acute Rehabilitation Services Office: [phone] Pager: [place] E 05/09/2019, 11:55 AM

## 2019-05-09 NOTE — Progress Notes (Signed)
[name] ZDG:[id number] DOB: 10-03-1934   PCP: Burnard Bunting, MD  Patient is from: Home  DOA: 05/01/2019 LOS: 8  Brief Narrative / Interim history: 84 year old female with history of DM-2, GERD, HTN, morbid obesity, hyperlipidemia, diverticulosis, hyperparathyroidism, anxiety disorder and bronchiectasis; admitted on 05/01/2019, presented with complaint of nausea and vomiting, was found to have gastric outlet obstruction concerning for malignancy. Had EGD by gastroenterology on 05/03/2019 revealed a esophagitis, nonbleeding erosive gastropathy, gastric ulcer with a clean ulcer base, multiple gastric polyps, acquired duodenal stenosis and mucosal change in the duodenum.  Biopsy suggested scarring from chronic inflammatory changes but negative for malignancy or dysplasia.  Underwent laparoscopic gastrojejunostomy and placement of feeding gastrojejunostomy tube by Dr. Johney Maine on 05/06/2019.  Subjective: No major events overnight of this morning.  She denies nausea, vomiting, chest pain, dyspnea or abdominal pain.  Had about 900 from G-tube.  Good effort on incentive spirometry.  Tube feed at 30 cc/h.  Objective: Vitals:   05/08/19 1434 05/08/19 2142 05/09/19 0518 05/09/19 1324  BP: (!) 131/97 (!) 119/50 130/65 (!) 122/56  Pulse: 81 82 77 82  Resp: 16 18 15 16   Temp: 98.2 F (36.8 C) 98.5 F (36.9 C) 98.1 F (36.7 C) 98.9 F (37.2 C)  TempSrc: Oral Oral Oral Oral  SpO2: 100% 100% 93% 96%  Weight:      Height:        Intake/Output Summary (Last 24 hours) at 05/09/2019 1520 Last data filed at 05/09/2019 1323 Gross per 24 hour  Intake 1202.24 ml  Output 1800 ml  Net -597.76 ml   Filed Weights   05/05/19 1445 05/07/19 0553 05/08/19 0602  Weight: 99.6 kg 106.5 kg 108.7 kg    Examination:  GENERAL: No acute distress.  Appears well.  HEENT: MMM.  Vision and hearing grossly intact.  NECK: Supple.  No apparent JVD but difficult exam.  RESP:  No IWOB. Good air  movement bilaterally. CVS:  RRR. Heart sounds normal.  ABD/GI/GU: Bowel sounds present. Soft.  Mild tenderness over LUQ and LLQ.  GJ-tube with cloudy drainage. MSK/EXT:  Moves extremities. No apparent deformity or edema.  SKIN: no apparent skin lesion or wound NEURO: Awake, alert and oriented appropriately.  No gross deficit.  PSYCH: Calm. Normal affect.   I have personally reviewed the following labs and images:  Radiology Studies: No results found.  Microbiology: Recent Results (from the past 240 hour(s))  SARS Coronavirus 2 (CEPHEID - Performed in [facility] hospital lab), Hosp Order     Status: None   Collection Time: 05/01/19  3:46 PM   Specimen: Nasopharyngeal Swab  Result Value Ref Range Status   SARS Coronavirus 2 NEGATIVE NEGATIVE Final    Comment: (NOTE) If result is NEGATIVE SARS-CoV-2 target nucleic acids are NOT DETECTED. The SARS-CoV-2 RNA is generally detectable in upper and lower  respiratory specimens during the acute phase of infection. The lowest  concentration of SARS-CoV-2 viral copies this assay can detect is 250  copies / mL. A negative result does not preclude SARS-CoV-2 infection  and should not be used as the sole basis for treatment or other  patient management decisions.  A negative result may occur with  improper specimen collection / handling, submission of specimen other  than nasopharyngeal swab, presence of viral mutation(s) within the  areas targeted by this assay, and inadequate number of viral copies  (<250 copies / mL). A negative result must be combined with clinical  observations, patient history,  and epidemiological information. If result is POSITIVE SARS-CoV-2 target nucleic acids are DETECTED. The SARS-CoV-2 RNA is generally detectable in upper and lower  respiratory specimens dur ing the acute phase of infection.  Positive  results are indicative of active infection with SARS-CoV-2.  Clinical  correlation with patient history and  other diagnostic information is  necessary to determine patient infection status.  Positive results do  not rule out bacterial infection or co-infection with other viruses. If result is PRESUMPTIVE POSTIVE SARS-CoV-2 nucleic acids MAY BE PRESENT.   A presumptive positive result was obtained on the submitted specimen  and confirmed on repeat testing.  While 2019 novel coronavirus  (SARS-CoV-2) nucleic acids may be present in the submitted sample  additional confirmatory testing may be necessary for epidemiological  and / or clinical management purposes  to differentiate between  SARS-CoV-2 and other Sarbecovirus currently known to infect humans.  If clinically indicated additional testing with an alternate test  methodology [phone]) is advised. The SARS-CoV-2 RNA is generally  detectable in upper and lower respiratory sp ecimens during the acute  phase of infection. The expected result is Negative. Fact Sheet for Patients:  StrictlyIdeas.no Fact Sheet for Healthcare Providers: BankingDealers.co.za This test is not yet approved or cleared by the [place] FDA and has been authorized for detection and/or diagnosis of SARS-CoV-2 by FDA under an Emergency Use Authorization (EUA).  This EUA will remain in effect (meaning this test can be used) for the duration of the COVID-19 declaration under Section 564(b)(1) of the Act, 21 U.S.C. section 360bbb-3(b)(1), unless the authorization is terminated or revoked sooner. Performed at [facility], [place] [address], [place], [place] [address]     Sepsis Labs: Invalid input(s): PROCALCITONIN, LACTICIDVEN  Urine analysis:    Component Value Date/Time   COLORURINE YELLOW 05/02/2019 0832   APPEARANCEUR CLEAR 05/02/2019 0832   LABSPEC 1.017 05/02/2019 0832   PHURINE 5.0 05/02/2019 0832   GLUCOSEU NEGATIVE 05/02/2019 0832   HGBUR SMALL (A) 05/02/2019 0832   BILIRUBINUR  NEGATIVE 05/02/2019 0832   KETONESUR NEGATIVE 05/02/2019 0832   PROTEINUR NEGATIVE 05/02/2019 0832   UROBILINOGEN 0.2 06/01/2010 0915   NITRITE NEGATIVE 05/02/2019 0832   LEUKOCYTESUR SMALL (A) 05/02/2019 0832    Anemia Panel: No results for input(s): VITAMINB12, FOLATE, FERRITIN, TIBC, IRON, RETICCTPCT in the last 72 hours.  Thyroid Function Tests: No results for input(s): TSH, T4TOTAL, FREET4, T3FREE, THYROIDAB in the last 72 hours.  Lipid Profile: No results for input(s): CHOL, HDL, LDLCALC, TRIG, CHOLHDL, LDLDIRECT in the last 72 hours.  CBG: Recent Labs  Lab 05/08/19 0728 05/08/19 1436 05/09/19 0004 05/09/19 0520 05/09/19 1147  GLUCAP 110* 123* 135* 129* 106*    HbA1C: No results for input(s): HGBA1C in the last 72 hours.  BNP (last 3 results): No results for input(s): PROBNP in the last 8760 hours.  Cardiac Enzymes: No results for input(s): CKTOTAL, CKMB, CKMBINDEX, TROPONINI in the last 168 hours.  Coagulation Profile: No results for input(s): INR, PROTIME in the last 168 hours.  Liver Function Tests: Recent Labs  Lab 05/03/19 2440 05/04/19 0345 05/06/19 0533 05/07/19 0419 05/08/19 0322  AST 18 17 20  44* 23  ALT 16 16 18  32 25  ALKPHOS 78 80 94 101 91  BILITOT 0.3 0.6 0.3 0.2* 0.3  PROT 4.6* 4.5* 4.8* 4.6* 4.4*  ALBUMIN 2.2* 2.1* 2.1* 2.1* 2.0*   No results for input(s): LIPASE, AMYLASE in the last 168 hours. No results for input(s): AMMONIA in  the last 168 hours.  Basic Metabolic Panel: Recent Labs  Lab 05/04/19 0345 05/05/19 0414 05/06/19 0533 05/07/19 0419 05/08/19 0322 05/09/19 0343  NA 140 141 141 139 141 142  K 3.4* 3.5 3.6 4.2 3.8 4.0  CL 113* 115* 116* 114* 115* 114*  CO2 23 21* 21* 21* 24 25  GLUCOSE 120* 158* 133* 122* 135* 136*  BUN 17 15 12 12 13 14   CREATININE 1.31* 1.14* 1.10* 1.21* 1.18* 1.01*  CALCIUM 8.2* 8.2* 8.7* 8.9 9.1 9.0  MG 1.8  --  2.0 1.9 1.9 1.8  PHOS  --   --  1.5* 2.3* 1.9* 1.9*   GFR: Estimated  Creatinine Clearance: 51.7 mL/min (A) (by C-G formula based on SCr of 1.01 mg/dL (H)).  CBC: Recent Labs  Lab 05/03/19 9937 05/04/19 0345 05/06/19 0533 05/07/19 0419 05/08/19 0322 05/09/19 0343  WBC 8.7 7.8 7.3 13.4* 9.4 8.7  NEUTROABS 5.7  --  5.0  --   --   --   HGB 11.5* 11.6* 11.8* 11.0* 10.7* 10.1*  HCT 37.8 39.2 39.5 35.6* 35.0* 33.5*  MCV 97.4 98.5 96.8 96.2 97.0 96.8  PLT 236 166 254 239 254 249    Procedures:  EGD on 05/03/2019 An esophagitis distally, nonbleeding erosive gastropathy, gastric ulcer with a clean ulcer base, multiple gastric polyps, acquired duodenal stenosis and mucosal change in the duodenum.  Biopsies suggest this chronic inflammatory change.  Negative for malignancy or dysplasia.  Microbiology summarized: [id number] negative.  Assessment & Plan: Acquired duodenal stenosis/gastric outlet obstruction -Reportedly not amenable to endoscopy.   -Pathology suggest this chronic inflammatory change.  Negative for malignancy or dysplasia. -No evidence of malignancy per pathology and general surgery. -Laparoscopic gastrojejunostomy with feeding GJ tube placement on 05/06/2019 by Dr. Johney Maine -NG tube discontinued 8/5 -Continue feeding tube per nutrition, and PPI. -GI recommended repeat EGD in 4 to 6 weeks with Dr. Silverio Decamp to reevaluate the duodenal stricture. -Continue tube feed.  Plan to increase to 40 cc/h today. -Closely monitor electrolytes and replenish aggressively. -PT/OT recommended HH with 24-hour supervision  Mild leukocytosis: Likely due to marginalization from surgery.  Resolved. -Recheck in the morning  AKI: Likely due to GI loss from nausea and vomiting.  Resolved. -Monitor renal function  Mild hypophosphatemia: Likely due to malnutrition while n.p.o. stable. -Continue monitoring for refeeding syndrome  Hypertension: Normotensive -Hold home losartan and diuretics.  GERD: -Continue PPI  Hypothyroidism: -Continue IV Synthroid.  No crushed  meds per J-tube, only solution.  Primary hyperparathyroidism: Stable -On Reclast outpatient.  Obstructive sleep apnea -Nightly CPAP if she tolerates but difficult with NG tube.  Class II obesity: BMI 36.94 -Encourage lifestyle change to lose weight  Nutrition: Tube feed -Keofeed per nutrition.  DVT prophylaxis: Subcu heparin Code Status: Full code Family Communication: Patient and/or RN. Available if any question.  Disposition Plan: Remains inpatient until tube feed at goal rate. Consultants: GI, general surgery   Antimicrobials: Anti-infectives (From admission, onward)   Start     Dose/Rate Route Frequency Ordered Stop   05/08/19 2200  erythromycin ethylsuccinate (EES) 200 MG/5ML suspension 400 mg     400 mg Per J Tube Every 8 hours 05/08/19 1531     05/06/19 2300  cefoTEtan (CEFOTAN) 2 g in sodium chloride 0.9 % 100 mL IVPB     2 g 200 mL/hr over 30 Minutes Intravenous Every 12 hours 05/06/19 1540 05/06/19 2255   05/06/19 2200  erythromycin ethylsuccinate (EES) 200 MG/5ML suspension 400 mg  Status:  Discontinued     400 mg Oral Every 8 hours 05/06/19 1606 05/08/19 1531   05/06/19 1600  erythromycin (EES) 400 MG/5ML suspension 400 mg  Status:  Discontinued     400 mg Per Tube 3 times daily 05/06/19 1540 05/06/19 1605   05/06/19 0815  cefoTEtan (CEFOTAN) 2 g in sodium chloride 0.9 % 100 mL IVPB     2 g 200 mL/hr over 30 Minutes Intravenous On call to O.R. 05/06/19 0808 05/06/19 1105      Sch Meds:  Scheduled Meds: . bupivacaine liposome  20 mL Infiltration Once  . chlorhexidine  15 mL Mouth Rinse BID  . erythromycin ethylsuccinate  400 mg Per J Tube Q8H  . feeding supplement (OSMOLITE 1.5 CAL)  1,000 mL Per Tube Q24H  . levothyroxine  25 mcg Intravenous Daily  . lip balm  1 application Topical BID  . mouth rinse  15 mL Mouth Rinse q12n4p  . metoCLOPramide (REGLAN) injection  10 mg Intravenous Q6H  . pantoprazole (PROTONIX) IV  40 mg Intravenous Q12H  . sertraline  50  mg Per Tube Daily   Continuous Infusions: . sodium chloride Stopped (05/05/19 1505)  . dextrose 5 % and 0.9% NaCl 60 mL/hr at 05/07/19 2357  . methocarbamol (ROBAXIN) IV     PRN Meds:.sodium chloride, acetaminophen, HYDROcodone-acetaminophen, magic mouthwash, menthol-cetylpyridinium, methocarbamol (ROBAXIN) IV, morphine injection, ondansetron **OR** ondansetron (ZOFRAN) IV, phenol, sodium chloride flush, sodium chloride flush   Raymir Frommelt T. Anderson  If 7PM-7AM, please contact night-coverage [email or web address] Password [email or web address] 05/09/2019, 3:20 PM

## 2019-05-10 LAB — COMPREHENSIVE METABOLIC PANEL
ALT: 18 U/L (ref 0–44)
AST: 19 U/L (ref 15–41)
Albumin: 1.9 g/dL — ABNORMAL LOW (ref 3.5–5.0)
Alkaline Phosphatase: 98 U/L (ref 38–126)
Anion gap: 4 — ABNORMAL LOW (ref 5–15)
BUN: 13 mg/dL (ref 8–23)
CO2: 24 mmol/L (ref 22–32)
Calcium: 9 mg/dL (ref 8.9–10.3)
Chloride: 113 mmol/L — ABNORMAL HIGH (ref 98–111)
Creatinine, Ser: 0.89 mg/dL (ref 0.44–1.00)
GFR calc Af Amer: >60 mL/min (ref >60)
GFR calc non Af Amer: >60 mL/min (ref >60)
Glucose, Bld: 117 mg/dL — ABNORMAL HIGH (ref 70–99)
Potassium: 3.8 mmol/L (ref 3.5–5.1)
Sodium: 141 mmol/L (ref 135–145)
Total Bilirubin: 0.4 mg/dL (ref 0.3–1.2)
Total Protein: 4.4 g/dL — ABNORMAL LOW (ref 6.5–8.1)

## 2019-05-10 LAB — MAGNESIUM: Magnesium: 1.8 mg/dL (ref 1.7–2.4)

## 2019-05-10 LAB — GLUCOSE, CAPILLARY
Glucose-Capillary: 121 mg/dL — ABNORMAL HIGH (ref 70–99)
Glucose-Capillary: 128 mg/dL — ABNORMAL HIGH (ref 70–99)
Glucose-Capillary: 130 mg/dL — ABNORMAL HIGH (ref 70–99)

## 2019-05-10 LAB — PHOSPHORUS: Phosphorus: 1.9 mg/dL — ABNORMAL LOW (ref 2.5–4.6)

## 2019-05-10 MED ORDER — SERTRALINE HCL 50 MG PO TABS
50.0000 mg | ORAL_TABLET | Freq: Every day | ORAL | Status: DC
  Administered 2019-05-10 – 2019-05-19 (×8): 50 mg via ORAL
  Filled 2019-05-10 (×8): qty 1

## 2019-05-10 MED ORDER — OSMOLITE 1.5 CAL PO LIQD
1000.0000 mL | ORAL | Status: DC
  Administered 2019-05-10 – 2019-05-16 (×5): 1000 mL
  Filled 2019-05-10 (×8): qty 1000

## 2019-05-10 MED ORDER — HYDROMORPHONE HCL 1 MG/ML IJ SOLN
0.5000 mg | Freq: Once | INTRAMUSCULAR | Status: AC
  Administered 2019-05-10: 0.5 mg via INTRAVENOUS
  Filled 2019-05-10: qty 0.5

## 2019-05-10 MED ORDER — METOCLOPRAMIDE HCL 5 MG/ML IJ SOLN
10.0000 mg | Freq: Once | INTRAMUSCULAR | Status: AC
  Administered 2019-05-10: 10 mg via INTRAVENOUS
  Filled 2019-05-10: qty 2

## 2019-05-10 NOTE — Progress Notes (Signed)
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 Rush Landmark - malignant appearing stricture at D1/D2 - biopsies were neg for dysplasia or malignancy   -prealbumin<5 - S/P laparoscopic gastrojejunostomy, feeding gastrojejunostomy tube, Dr. Johney Maine, 08/04 - J tube for TF's, advance to 64mL/hr (goal).  - NO CRUSHED MEDS PER J TUBE, SOLUTIONS ONLY - G tube to gravity - hycet per J tube for pain - monitor for ileus, reglan 10mg  q6hr per Dr. Johney Maine - Patient needs to mobilize   FEN: NPO, IVF, ice chips and sips okay VTE: SCDs, SQ heparin ID: no current abx, afeb, no leukocytosis Foley:none Follow up:Dr. Gross   LOS: 9 days    Subjective: CC: Abdominal pain  Patient states her abdomen is sore since surgery.  She has not been out of bed to walk.  She denies nausea or vomiting.  She is having flatus but no BMs.  She states she is too tired to ambulate but she has been walking to the bathroom.   Objective: Vital signs in last 24 hours: Temp:  [97.8 F (36.6 C)-98.9 F (37.2 C)] 98 F (36.7 C) (08/08 0601) Pulse Rate:  [81-86] 86 (08/08 0601) Resp:  [16-18] 18 (08/08 0601) BP: (122-138)/(56-88) 135/88 (08/08 0601) SpO2:  [91 %-96 %] 92 % (08/08 0601) Last BM Date: 05/01/19  Intake/Output from previous day: 08/07 0701 - 08/08 0700 In: 1651.1 [P.O.:60; I.V.:1591.1] Out: 3000 [Urine:850; Drains:2150] Intake/Output this shift: No intake/output data recorded.  PE: Gen: Alert, NAD, pleasant, cooperative Pulm:Rate andeffort normal Abd: Soft,obese,ND, + BS, incisions C/D/I,GJ tube in place and site C/D/I, mild TTP of left hemiabdomen without guarding, no peritonitis, G tube drainage is cloudy and bilious Skin: no rashes noted, warm and  dry   Anti-infectives: Anti-infectives (From admission, onward)   Start     Dose/Rate Route Frequency Ordered Stop   05/08/19 2200  erythromycin ethylsuccinate (EES) 200 MG/5ML suspension 400 mg     400 mg Per J Tube Every 8 hours 05/08/19 1531     05/06/19 2300  cefoTEtan (CEFOTAN) 2 g in sodium chloride 0.9 % 100 mL IVPB     2 g 200 mL/hr over 30 Minutes Intravenous Every 12 hours 05/06/19 1540 05/06/19 2255   05/06/19 2200  erythromycin ethylsuccinate (EES) 200 MG/5ML suspension 400 mg  Status:  Discontinued     400 mg Oral Every 8 hours 05/06/19 1606 05/08/19 1531   05/06/19 1600  erythromycin (EES) 400 MG/5ML suspension 400 mg  Status:  Discontinued     400 mg Per Tube 3 times daily 05/06/19 1540 05/06/19 1605   05/06/19 0815  cefoTEtan (CEFOTAN) 2 g in sodium chloride 0.9 % 100 mL IVPB     2 g 200 mL/hr over 30 Minutes Intravenous On call to O.R. 05/06/19 0808 05/06/19 1105      Lab Results:  Recent Labs    05/08/19 0322 05/09/19 0343  WBC 9.4 8.7  HGB 10.7* 10.1*  HCT 35.0* 33.5*  PLT 254 249   BMET Recent Labs    05/09/19 0343 05/10/19 0426  NA 142 141  K 4.0 3.8  CL 114* 113*  CO2 25 24  GLUCOSE 136* 117*  BUN 14 13  CREATININE 1.01* 0.89  CALCIUM 9.0 9.0   PT/INR No results for input(s): LABPROT, INR in the  last 72 hours. CMP     Component Value Date/Time   NA 141 05/10/2019 0426   K 3.8 05/10/2019 0426   CL 113 (H) 05/10/2019 0426   CO2 24 05/10/2019 0426   GLUCOSE 117 (H) 05/10/2019 0426   BUN 13 05/10/2019 0426   CREATININE 0.89 05/10/2019 0426   CREATININE 1.09 (H) 08/04/2015 1346   CALCIUM 9.0 05/10/2019 0426   PROT 4.4 (L) 05/10/2019 0426   PROT 6.5 03/17/2014 1348   ALBUMIN 1.9 (L) 05/10/2019 0426   AST 19 05/10/2019 0426   ALT 18 05/10/2019 0426   ALKPHOS 98 05/10/2019 0426   BILITOT 0.4 05/10/2019 0426   GFRNONAA >60 05/10/2019 0426   GFRAA >60 05/10/2019 0426   Lipase     Component Value Date/Time   LIPASE 30 05/01/2019 1524     Studies/Results: No results found.    Rosario Adie, MD  [facility]

## 2019-05-10 NOTE — Progress Notes (Signed)
Pt experiencing 9/10 pain. RN administered Hycet via G tube. Tube was flushed and clamped. Pt became nauseous and reported no pain relief, so G tube was returned to gravity drainage after 30 minutes. Nausea continued, so on-call MD Bodenheimer was paged. He ordered Dilaudid 0.5mg  IV once, and Reglan 10 mg IV once. Also ordered to pause tube feeding for 2 hours. Pt experiencing some relief at this time. Will continue to monitor.

## 2019-05-10 NOTE — Progress Notes (Signed)
PT Cancellation Note  Patient Details Name: [name] MRN: [id number] DOB: 11/07/1934   Cancelled Treatment:    Reason Eval/Treat Not Completed: Pain limiting ability to participate Nurse tech just left room and reports patient shivering and reporting increased pain.  Will check back as schedule permits.   [name],KATHrine E 05/10/2019, 2:50 PM Carmelia Bake, PT, DPT Acute Rehabilitation Services Office: [phone] Pager: [phone]

## 2019-05-10 NOTE — Progress Notes (Signed)
Dr. Marcello Moores aware that feed tubing remains lodged in J tube with multiple attempts to remove without success. Order received to modify erythromycin to be given thru G tube.

## 2019-05-10 NOTE — Progress Notes (Signed)
PROGRESS NOTE  [name] CMK:[id number] DOB: [birth year]   PCP: Burnard Bunting, MD  Patient is from: Home  DOA: 05/01/2019 LOS: 9  Brief Narrative / Interim history: 84 year old female with history of DM-2, GERD, HTN, morbid obesity, hyperlipidemia, diverticulosis, hyperparathyroidism, anxiety disorder and bronchiectasis; admitted on 05/01/2019, presented with complaint of nausea and vomiting, was found to have gastric outlet obstruction concerning for malignancy. Had EGD by gastroenterology on 05/03/2019 revealed a esophagitis, nonbleeding erosive gastropathy, gastric ulcer with a clean ulcer base, multiple gastric polyps, acquired duodenal stenosis and mucosal change in the duodenum.  Biopsy suggested scarring from chronic inflammatory changes but negative for malignancy or dysplasia.  Underwent laparoscopic gastrojejunostomy and placement of feeding gastrojejunostomy tube by Dr. Johney Maine on 05/06/2019.  Subjective: No major events overnight of this morning.  About 2.2 L output from G-tube.  She denies nausea, vomiting or significant abdominal pain.  Denies chest pain or dyspnea.  Worked with PT yesterday.  Objective: Vitals:   05/09/19 0518 05/09/19 1324 05/09/19 2056 05/10/19 0601  BP: 130/65 (!) 122/56 138/64 135/88  Pulse: 77 82 81 86  Resp: 15 16 18 18   Temp: 98.1 F (36.7 C) 98.9 F (37.2 C) 97.8 F (36.6 C) 98 F (36.7 C)  TempSrc: Oral Oral Oral Oral  SpO2: 93% 96% 91% 92%  Weight:      Height:        Intake/Output Summary (Last 24 hours) at 05/10/2019 1327 Last data filed at 05/10/2019 1210 Gross per 24 hour  Intake 2191.19 ml  Output 3250 ml  Net -1058.81 ml   Filed Weights   05/05/19 1445 05/07/19 0553 05/08/19 0602  Weight: 99.6 kg 106.5 kg 108.7 kg    Examination:  GENERAL: No acute distress.  HEENT: MMM.  Vision and hearing grossly intact.  NECK: Supple.  No apparent JVD but difficult exam. RESP:  No IWOB.  Fair air movement bilaterally but limited exam.  CVS:  RRR. Heart sounds normal.  ABD/GI/GU: Bowel sounds present. Soft.  Mild diffuse tenderness.  G-tube with cloudy drain that looks like a tube feed. MSK/EXT:  Moves extremities. No apparent deformity or edema.  SKIN: no apparent skin lesion or wound NEURO: Awake, alert and oriented appropriately.  No gross deficit.  PSYCH: Calm. Normal affect.   I have personally reviewed the following labs and images:  Radiology Studies: No results found.  Microbiology: Recent Results (from the past 240 hour(s))  SARS Coronavirus 2 (CEPHEID - Performed in [place] hospital lab), Hosp Order     Status: None   Collection Time: 05/01/19  3:46 PM   Specimen: Nasopharyngeal Swab  Result Value Ref Range Status   SARS Coronavirus 2 NEGATIVE NEGATIVE Final    Comment: (NOTE) If result is NEGATIVE SARS-CoV-2 target nucleic acids are NOT DETECTED. The SARS-CoV-2 RNA is generally detectable in upper and lower  respiratory specimens during the acute phase of infection. The lowest  concentration of SARS-CoV-2 viral copies this assay can detect is 250  copies / mL. A negative result does not preclude SARS-CoV-2 infection  and should not be used as the sole basis for treatment or other  patient management decisions.  A negative result may occur with  improper specimen collection / handling, submission of specimen other  than nasopharyngeal swab, presence of viral mutation(s) within the  areas targeted by this assay, and inadequate number of viral copies  (<250 copies / mL). A negative result must be combined with clinical  observations, patient history, and  epidemiological information. If result is POSITIVE SARS-CoV-2 target nucleic acids are DETECTED. The SARS-CoV-2 RNA is generally detectable in upper and lower  respiratory specimens dur ing the acute phase of infection.  Positive  results are indicative of active infection with SARS-CoV-2.  Clinical  correlation with patient history and other  diagnostic information is  necessary to determine patient infection status.  Positive results do  not rule out bacterial infection or co-infection with other viruses. If result is PRESUMPTIVE POSTIVE SARS-CoV-2 nucleic acids MAY BE PRESENT.   A presumptive positive result was obtained on the submitted specimen  and confirmed on repeat testing.  While 2019 novel coronavirus  (SARS-CoV-2) nucleic acids may be present in the submitted sample  additional confirmatory testing may be necessary for epidemiological  and / or clinical management purposes  to differentiate between  SARS-CoV-2 and other Sarbecovirus currently known to infect humans.  If clinically indicated additional testing with an alternate test  methodology [phone]) is advised. The SARS-CoV-2 RNA is generally  detectable in upper and lower respiratory sp ecimens during the acute  phase of infection. The expected result is Negative. Fact Sheet for Patients:  StrictlyIdeas.no Fact Sheet for Healthcare Providers: BankingDealers.co.za This test is not yet approved or cleared by the [place] FDA and has been authorized for detection and/or diagnosis of SARS-CoV-2 by FDA under an Emergency Use Authorization (EUA).  This EUA will remain in effect (meaning this test can be used) for the duration of the COVID-19 declaration under Section 564(b)(1) of the Act, 21 U.S.C. section 360bbb-3(b)(1), unless the authorization is terminated or revoked sooner. Performed at [facility], [place] [address] [place], [place] [address]     Sepsis Labs: Invalid input(s): PROCALCITONIN, LACTICIDVEN  Urine analysis:    Component Value Date/Time   COLORURINE YELLOW 05/02/2019 0832   APPEARANCEUR CLEAR 05/02/2019 0832   LABSPEC 1.017 05/02/2019 0832   PHURINE 5.0 05/02/2019 0832   GLUCOSEU NEGATIVE 05/02/2019 0832   HGBUR SMALL (A) 05/02/2019 0832   BILIRUBINUR NEGATIVE  05/02/2019 0832   KETONESUR NEGATIVE 05/02/2019 0832   PROTEINUR NEGATIVE 05/02/2019 0832   UROBILINOGEN 0.2 06/01/2010 0915   NITRITE NEGATIVE 05/02/2019 0832   LEUKOCYTESUR SMALL (A) 05/02/2019 0832    Anemia Panel: No results for input(s): VITAMINB12, FOLATE, FERRITIN, TIBC, IRON, RETICCTPCT in the last 72 hours.  Thyroid Function Tests: No results for input(s): TSH, T4TOTAL, FREET4, T3FREE, THYROIDAB in the last 72 hours.  Lipid Profile: No results for input(s): CHOL, HDL, LDLCALC, TRIG, CHOLHDL, LDLDIRECT in the last 72 hours.  CBG: Recent Labs  Lab 05/09/19 1147 05/09/19 1655 05/09/19 2352 05/10/19 0558 05/10/19 1205  GLUCAP 106* 116* 107* 121* 128*    HbA1C: No results for input(s): HGBA1C in the last 72 hours.  BNP (last 3 results): No results for input(s): PROBNP in the last 8760 hours.  Cardiac Enzymes: No results for input(s): CKTOTAL, CKMB, CKMBINDEX, TROPONINI in the last 168 hours.  Coagulation Profile: No results for input(s): INR, PROTIME in the last 168 hours.  Liver Function Tests: Recent Labs  Lab 05/04/19 0345 05/06/19 0533 05/07/19 0419 05/08/19 0322 05/10/19 0426  AST 17 20 44* 23 19  ALT 16 18 32 25 18  ALKPHOS 80 94 101 91 98  BILITOT 0.6 0.3 0.2* 0.3 0.4  PROT 4.5* 4.8* 4.6* 4.4* 4.4*  ALBUMIN 2.1* 2.1* 2.1* 2.0* 1.9*   No results for input(s): LIPASE, AMYLASE in the last 168 hours. No results for input(s): AMMONIA in the  last 168 hours.  Basic Metabolic Panel: Recent Labs  Lab 05/06/19 0533 05/07/19 0419 05/08/19 0322 05/09/19 0343 05/10/19 0426  NA 141 139 141 142 141  K 3.6 4.2 3.8 4.0 3.8  CL 116* 114* 115* 114* 113*  CO2 21* 21* 24 25 24   GLUCOSE 133* 122* 135* 136* 117*  BUN 12 12 13 14 13   CREATININE 1.10* 1.21* 1.18* 1.01* 0.89  CALCIUM 8.7* 8.9 9.1 9.0 9.0  MG 2.0 1.9 1.9 1.8 1.8  PHOS 1.5* 2.3* 1.9* 1.9* 1.9*   GFR: Estimated Creatinine Clearance: 58.6 mL/min (by C-G formula based on SCr of 0.89 mg/dL).   CBC: Recent Labs  Lab 05/04/19 0345 05/06/19 0533 05/07/19 0419 05/08/19 0322 05/09/19 0343  WBC 7.8 7.3 13.4* 9.4 8.7  NEUTROABS  --  5.0  --   --   --   HGB 11.6* 11.8* 11.0* 10.7* 10.1*  HCT 39.2 39.5 35.6* 35.0* 33.5*  MCV 98.5 96.8 96.2 97.0 96.8  PLT 166 254 239 254 249    Procedures:  EGD on 05/03/2019 An esophagitis distally, nonbleeding erosive gastropathy, gastric ulcer with a clean ulcer base, multiple gastric polyps, acquired duodenal stenosis and mucosal change in the duodenum.  Biopsies suggest this chronic inflammatory change.  Negative for malignancy or dysplasia.  Microbiology summarized: [id number] negative.  Assessment & Plan: Acquired duodenal stenosis/gastric outlet obstruction -Reportedly not amenable to endoscopy.   -Pathology suggest this chronic inflammatory change.  Negative for malignancy or dysplasia. -No evidence of malignancy per pathology and general surgery. -Laparoscopic gastrojejunostomy with feeding GJ tube placement on 05/06/2019 by Dr. Johney Maine -NG tube discontinued 8/5 -GI recommended repeat EGD in 4 to 6 weeks with Dr. Silverio Decamp to reevaluate the duodenal stricture. -Continue tube feed per general surgery and nutrition -She has about 2.2 L output from G-tube although bowel sounds seems to be returning.  Continue Reglan and azithromycin. -Continue D5-NS -Closely monitor electrolytes and replenish aggressively. -PT/OT recommended HH with 24-hour supervision -Mobilize  Mild leukocytosis: Likely due to marginalization from surgery.  Resolved. -Recheck in the morning  AKI: Likely due to GI loss from nausea and vomiting.  Resolved. -Monitor renal function  Mild hypophosphatemia: Likely due to malnutrition while n.p.o. stable. -Continue monitoring for refeeding syndrome  Hypertension: Normotensive -Hold home losartan and diuretics.  GERD: -Continue PPI  Hypothyroidism: -Continue IV Synthroid.  No crushed meds per J-tube, only solution.   Primary hyperparathyroidism: Stable -On Reclast outpatient.  Obstructive sleep apnea -Nightly CPAP if she tolerates but difficult with NG tube.  Class II obesity: BMI 36.94 -Encourage lifestyle change to lose weight  Nutrition: Tube feed -TF as above.  DVT prophylaxis: Subcu heparin Code Status: Full code Family Communication: Patient and/or RN. Available if any question.  Disposition Plan: Remains inpatient until she tolerates tube feed at goal rate. Consultants: GI, general surgery   Antimicrobials: Anti-infectives (From admission, onward)   Start     Dose/Rate Route Frequency Ordered Stop   05/08/19 2200  erythromycin ethylsuccinate (EES) 200 MG/5ML suspension 400 mg     400 mg Per J Tube Every 8 hours 05/08/19 1531     05/06/19 2300  cefoTEtan (CEFOTAN) 2 g in sodium chloride 0.9 % 100 mL IVPB     2 g 200 mL/hr over 30 Minutes Intravenous Every 12 hours 05/06/19 1540 05/06/19 2255   05/06/19 2200  erythromycin ethylsuccinate (EES) 200 MG/5ML suspension 400 mg  Status:  Discontinued     400 mg Oral Every 8 hours 05/06/19 1606 05/08/19  1531   05/06/19 1600  erythromycin (EES) 400 MG/5ML suspension 400 mg  Status:  Discontinued     400 mg Per Tube 3 times daily 05/06/19 1540 05/06/19 1605   05/06/19 0815  cefoTEtan (CEFOTAN) 2 g in sodium chloride 0.9 % 100 mL IVPB     2 g 200 mL/hr over 30 Minutes Intravenous On call to O.R. 05/06/19 0808 05/06/19 1105      Sch Meds:  Scheduled Meds: . chlorhexidine  15 mL Mouth Rinse BID  . erythromycin ethylsuccinate  400 mg Per J Tube Q8H  . feeding supplement (OSMOLITE 1.5 CAL)  1,000 mL Per Tube Q24H  . levothyroxine  25 mcg Intravenous Daily  . lip balm  1 application Topical BID  . mouth rinse  15 mL Mouth Rinse q12n4p  . pantoprazole (PROTONIX) IV  40 mg Intravenous Q12H  . sertraline  50 mg Oral Daily   Continuous Infusions: . sodium chloride Stopped (05/05/19 1505)  . dextrose 5 % and 0.9% NaCl 60 mL/hr at 05/10/19 0600   . methocarbamol (ROBAXIN) IV     PRN Meds:.sodium chloride, acetaminophen, HYDROcodone-acetaminophen, magic mouthwash, menthol-cetylpyridinium, methocarbamol (ROBAXIN) IV, morphine injection, ondansetron **OR** ondansetron (ZOFRAN) IV, phenol, sodium chloride flush, sodium chloride flush   Leith Hedlund T. [place]  If 7PM-7AM, please contact night-coverage [email or web address] Password [email or web address] 05/10/2019, 1:27 PM

## 2019-05-11 LAB — GLUCOSE, CAPILLARY
Glucose-Capillary: 113 mg/dL — ABNORMAL HIGH (ref 70–99)
Glucose-Capillary: 103 mg/dL — ABNORMAL HIGH (ref 70–99)
Glucose-Capillary: 99 mg/dL (ref 70–99)
Glucose-Capillary: 89 mg/dL (ref 70–99)

## 2019-05-11 LAB — PHOSPHORUS: Phosphorus: 1.9 mg/dL — ABNORMAL LOW (ref 2.5–4.6)

## 2019-05-11 LAB — MAGNESIUM: Magnesium: 1.9 mg/dL (ref 1.7–2.4)

## 2019-05-11 LAB — HEMOGLOBIN AND HEMATOCRIT, BLOOD
HCT: 35.1 % — ABNORMAL LOW (ref 36.0–46.0)
Hemoglobin: 10.7 g/dL — ABNORMAL LOW (ref 12.0–15.0)

## 2019-05-11 MED ORDER — ERYTHROMYCIN ETHYLSUCCINATE 200 MG/5ML PO SUSR
400.0000 mg | Freq: Three times a day (TID) | ORAL | Status: DC
  Administered 2019-05-11 – 2019-05-18 (×16): 400 mg via JEJUNOSTOMY
  Administered 2019-05-18: 40 mg via JEJUNOSTOMY
  Administered 2019-05-18 – 2019-05-19 (×3): 400 mg via JEJUNOSTOMY
  Filled 2019-05-11 (×27): qty 10

## 2019-05-11 MED ORDER — SODIUM PHOSPHATES 45 MMOLE/15ML IV SOLN
20.0000 mmol | Freq: Once | INTRAVENOUS | Status: AC
  Administered 2019-05-11: 20 mmol via INTRAVENOUS
  Filled 2019-05-11: qty 6.67

## 2019-05-11 NOTE — Progress Notes (Signed)
Dr. Lucia Gaskins aware via phone unable to unclog J tube. Working off and on since 0830 plunging with cola yet unable to penetrate clog. MD suggested smaller bore syringe with NS, but stated "If still unable, we will deal with it in the morning."

## 2019-05-11 NOTE — Progress Notes (Signed)
[facility]/Trauma Progress Note  5 Days Post-Op   Assessment/Plan HTN T2DM - diet controlled AKI - Cr 1.14 Hypothyroidism Hyperparathyroidism GERD OSA Obesity - BMI 36.94  Gastric Outlet Obstruction from narrowing at D1/D2 - s/p EGD 8/1 Dr. Rush Landmark - malignant appearing stricture at D1/D2 - biopsies were neg for dysplasia or malignancy   -prealbumin<5 - S/P laparoscopic gastrojejunostomy, feeding gastrojejunostomy tube, Dr. Johney Maine, 08/04 - J tube for TF's, currently clogged due to stopping tube feeds and not flushing tube - NO CRUSHED MEDS PER J TUBE, SOLUTIONS ONLY - G tube to gravity - hycet per J tube for pain - monitor for ileus, reglan 10mg  q6hr per Dr. Johney Maine - Patient needs to mobilize   FEN: NPO, IVF, ice chips and sips okay VTE: SCDs, SQ heparin ID: no current abx, afeb, no leukocytosis Foley:none Follow up:Dr. Gross   LOS: 10 days    Subjective: CC: Abdominal pain  Patient states her abdomen is sore.  Tube feeds have been stopped and line is clogged.  Surgery team wasn't notified of tube feed issues  Objective: Vital signs in last 24 hours: Temp:  [97.9 F (36.6 C)-98.4 F (36.9 C)] 98.4 F (36.9 C) (08/09 0555) Pulse Rate:  [79-89] 79 (08/09 0555) Resp:  [10-20] 10 (08/09 0555) BP: (134-137)/(56-69) 137/56 (08/09 0555) SpO2:  [95 %-97 %] 95 % (08/09 0555) Last BM Date: 05/01/19  Intake/Output from previous day: 08/08 0701 - 08/09 0700 In: 2960.7 [P.O.:480; I.V.:1367.4; NG/GT:943.3; IV Piggyback:50] Out: 1725 [Urine:500; Drains:1225] Intake/Output this shift: No intake/output data recorded.  PE: Gen: Alert, NAD, pleasant, cooperative Pulm:Rate andeffort normal Abd: Soft,obese,ND, incisions C/D/I,GJ tube in place and site C/D/I, mild TTP of left hemiabdomen without guarding, no peritonitis, G tube drainage is cloudy and bilious Skin: no rashes noted, warm and dry   Anti-infectives: Anti-infectives (From  admission, onward)   Start     Dose/Rate Route Frequency Ordered Stop   05/08/19 2200  erythromycin ethylsuccinate (EES) 200 MG/5ML suspension 400 mg     400 mg Per J Tube Every 8 hours 05/08/19 1531     05/06/19 2300  cefoTEtan (CEFOTAN) 2 g in sodium chloride 0.9 % 100 mL IVPB     2 g 200 mL/hr over 30 Minutes Intravenous Every 12 hours 05/06/19 1540 05/06/19 2255   05/06/19 2200  erythromycin ethylsuccinate (EES) 200 MG/5ML suspension 400 mg  Status:  Discontinued     400 mg Oral Every 8 hours 05/06/19 1606 05/08/19 1531   05/06/19 1600  erythromycin (EES) 400 MG/5ML suspension 400 mg  Status:  Discontinued     400 mg Per Tube 3 times daily 05/06/19 1540 05/06/19 1605   05/06/19 0815  cefoTEtan (CEFOTAN) 2 g in sodium chloride 0.9 % 100 mL IVPB     2 g 200 mL/hr over 30 Minutes Intravenous On call to O.R. 05/06/19 0808 05/06/19 1105      Lab Results:  Recent Labs    05/09/19 0343 05/11/19 0550  WBC 8.7  --   HGB 10.1* 10.7*  HCT 33.5* 35.1*  PLT 249  --    BMET Recent Labs    05/09/19 0343 05/10/19 0426  NA 142 141  K 4.0 3.8  CL 114* 113*  CO2 25 24  GLUCOSE 136* 117*  BUN 14 13  CREATININE 1.01* 0.89  CALCIUM 9.0 9.0   PT/INR No results for input(s): LABPROT, INR in the last 72 hours. CMP     Component Value Date/Time   NA  141 05/10/2019 0426   K 3.8 05/10/2019 0426   CL 113 (H) 05/10/2019 0426   CO2 24 05/10/2019 0426   GLUCOSE 117 (H) 05/10/2019 0426   BUN 13 05/10/2019 0426   CREATININE 0.89 05/10/2019 0426   CREATININE 1.09 (H) 08/04/2015 1346   CALCIUM 9.0 05/10/2019 0426   PROT 4.4 (L) 05/10/2019 0426   PROT 6.5 03/17/2014 1348   ALBUMIN 1.9 (L) 05/10/2019 0426   AST 19 05/10/2019 0426   ALT 18 05/10/2019 0426   ALKPHOS 98 05/10/2019 0426   BILITOT 0.4 05/10/2019 0426   GFRNONAA >60 05/10/2019 0426   GFRAA >60 05/10/2019 0426   Lipase     Component Value Date/Time   LIPASE 30 05/01/2019 1524    Studies/Results: No results  found.    Rosario Adie, MD  [facility]

## 2019-05-11 NOTE — Progress Notes (Signed)
PROGRESS NOTE  [name] OEU:[id number] DOB: 09-May-1935   PCP: Burnard Bunting, MD  Patient is from: Home  DOA: 05/01/2019 LOS: 7  Brief Narrative / Interim history: 84 year old female with history of DM-2, GERD, HTN, morbid obesity, hyperlipidemia, diverticulosis, hyperparathyroidism, anxiety disorder and bronchiectasis; admitted on 05/01/2019, presented with complaint of nausea and vomiting, was found to have gastric outlet obstruction concerning for malignancy. Had EGD by gastroenterology on 05/03/2019 revealed a esophagitis, nonbleeding erosive gastropathy, gastric ulcer with a clean ulcer base, multiple gastric polyps, acquired duodenal stenosis and mucosal change in the duodenum.  Biopsy suggested scarring from chronic inflammatory changes but negative for malignancy or dysplasia.  Underwent laparoscopic gastrojejunostomy and placement of feeding gastrojejunostomy tube by Dr. Johney Maine on 05/06/2019.  Subjective: Patient had significant abdominal pain overnight that did not resolve with pain medication and antiemetics.  Subsequently, feeding tube stopped.  Pain resolved.  J-tube clogged after stopping the tube feed.  RN not able to flush or resume tube feed morning.  Patient denies chest pain, dyspnea, nausea, vomiting or abdominal pain.  Denies GU symptoms.  Objective: Vitals:   05/10/19 0601 05/10/19 1338 05/10/19 2100 05/11/19 0555  BP: 135/88 (!) 134/58 136/69 (!) 137/56  Pulse: 86 84 89 79  Resp: 18 16 20 10   Temp: 98 F (36.7 C) 97.9 F (36.6 C) 98.2 F (36.8 C) 98.4 F (36.9 C)  TempSrc: Oral Oral Oral Oral  SpO2: 92% 97% 96% 95%  Weight:      Height:        Intake/Output Summary (Last 24 hours) at 05/11/2019 1157 Last data filed at 05/11/2019 1123 Gross per 24 hour  Intake 3200.62 ml  Output 1275 ml  Net 1925.62 ml   Filed Weights   05/05/19 1445 05/07/19 0553 05/08/19 0602  Weight: 99.6 kg 106.5 kg 108.7 kg    Examination:  GENERAL: No acute distress.  Appears  well.  HEENT: MMM.  Vision and hearing grossly intact.  NECK: Supple.  No apparent JVD but difficult exam.  RESP:  No IWOB. Good air movement bilaterally. CVS:  RRR. Heart sounds normal.  ABD/GI/GU: Bowel sounds present. Soft.  Mild tenderness.  GJ tube.  Tube feed paused. MSK/EXT:  Moves extremities. No apparent deformity or edema.  SKIN: no apparent skin lesion or wound NEURO: Awake, alert and oriented appropriately.  No gross deficit.  PSYCH: Calm. Normal affect.   I have personally reviewed the following labs and images:  Radiology Studies: No results found.  Microbiology: Recent Results (from the past 240 hour(s))  SARS Coronavirus 2 (CEPHEID - Performed in [place] hospital lab), Hosp Order     Status: None   Collection Time: 05/01/19  3:46 PM   Specimen: Nasopharyngeal Swab  Result Value Ref Range Status   SARS Coronavirus 2 NEGATIVE NEGATIVE Final    Comment: (NOTE) If result is NEGATIVE SARS-CoV-2 target nucleic acids are NOT DETECTED. The SARS-CoV-2 RNA is generally detectable in upper and lower  respiratory specimens during the acute phase of infection. The lowest  concentration of SARS-CoV-2 viral copies this assay can detect is 250  copies / mL. A negative result does not preclude SARS-CoV-2 infection  and should not be used as the sole basis for treatment or other  patient management decisions.  A negative result may occur with  improper specimen collection / handling, submission of specimen other  than nasopharyngeal swab, presence of viral mutation(s) within the  areas targeted by this assay, and inadequate number of viral  copies  (<250 copies / mL). A negative result must be combined with clinical  observations, patient history, and epidemiological information. If result is POSITIVE SARS-CoV-2 target nucleic acids are DETECTED. The SARS-CoV-2 RNA is generally detectable in upper and lower  respiratory specimens dur ing the acute phase of infection.   Positive  results are indicative of active infection with SARS-CoV-2.  Clinical  correlation with patient history and other diagnostic information is  necessary to determine patient infection status.  Positive results do  not rule out bacterial infection or co-infection with other viruses. If result is PRESUMPTIVE POSTIVE SARS-CoV-2 nucleic acids MAY BE PRESENT.   A presumptive positive result was obtained on the submitted specimen  and confirmed on repeat testing.  While 2019 novel coronavirus  (SARS-CoV-2) nucleic acids may be present in the submitted sample  additional confirmatory testing may be necessary for epidemiological  and / or clinical management purposes  to differentiate between  SARS-CoV-2 and other Sarbecovirus currently known to infect humans.  If clinically indicated additional testing with an alternate test  methodology [phone]) is advised. The SARS-CoV-2 RNA is generally  detectable in upper and lower respiratory sp ecimens during the acute  phase of infection. The expected result is Negative. Fact Sheet for Patients:  StrictlyIdeas.no Fact Sheet for Healthcare Providers: BankingDealers.co.za This test is not yet approved or cleared by the [place] FDA and has been authorized for detection and/or diagnosis of SARS-CoV-2 by FDA under an Emergency Use Authorization (EUA).  This EUA will remain in effect (meaning this test can be used) for the duration of the COVID-19 declaration under Section 564(b)(1) of the Act, 21 U.S.C. section 360bbb-3(b)(1), unless the authorization is terminated or revoked sooner. Performed at [facility], [place] [address] [place], [place] [address]     Sepsis Labs: Invalid input(s): PROCALCITONIN, LACTICIDVEN  Urine analysis:    Component Value Date/Time   COLORURINE YELLOW 05/02/2019 0832   APPEARANCEUR CLEAR 05/02/2019 0832   LABSPEC 1.017 05/02/2019 0832    PHURINE 5.0 05/02/2019 0832   GLUCOSEU NEGATIVE 05/02/2019 0832   HGBUR SMALL (A) 05/02/2019 0832   BILIRUBINUR NEGATIVE 05/02/2019 0832   KETONESUR NEGATIVE 05/02/2019 0832   PROTEINUR NEGATIVE 05/02/2019 0832   UROBILINOGEN 0.2 06/01/2010 0915   NITRITE NEGATIVE 05/02/2019 0832   LEUKOCYTESUR SMALL (A) 05/02/2019 0832    Anemia Panel: No results for input(s): VITAMINB12, FOLATE, FERRITIN, TIBC, IRON, RETICCTPCT in the last 72 hours.  Thyroid Function Tests: No results for input(s): TSH, T4TOTAL, FREET4, T3FREE, THYROIDAB in the last 72 hours.  Lipid Profile: No results for input(s): CHOL, HDL, LDLCALC, TRIG, CHOLHDL, LDLDIRECT in the last 72 hours.  CBG: Recent Labs  Lab 05/10/19 1205 05/10/19 1752 05/11/19 0026 05/11/19 0744 05/11/19 1148  GLUCAP 128* 130* 113* 103* 99    HbA1C: No results for input(s): HGBA1C in the last 72 hours.  BNP (last 3 results): No results for input(s): PROBNP in the last 8760 hours.  Cardiac Enzymes: No results for input(s): CKTOTAL, CKMB, CKMBINDEX, TROPONINI in the last 168 hours.  Coagulation Profile: No results for input(s): INR, PROTIME in the last 168 hours.  Liver Function Tests: Recent Labs  Lab 05/06/19 0533 05/07/19 0419 05/08/19 0322 05/10/19 0426  AST 20 44* 23 19  ALT 18 32 25 18  ALKPHOS 94 101 91 98  BILITOT 0.3 0.2* 0.3 0.4  PROT 4.8* 4.6* 4.4* 4.4*  ALBUMIN 2.1* 2.1* 2.0* 1.9*   No results for input(s): LIPASE, AMYLASE in  the last 168 hours. No results for input(s): AMMONIA in the last 168 hours.  Basic Metabolic Panel: Recent Labs  Lab 05/06/19 0533 05/07/19 0419 05/08/19 0322 05/09/19 0343 05/10/19 0426 05/11/19 0550  NA 141 139 141 142 141  --   K 3.6 4.2 3.8 4.0 3.8  --   CL 116* 114* 115* 114* 113*  --   CO2 21* 21* 24 25 24   --   GLUCOSE 133* 122* 135* 136* 117*  --   BUN 12 12 13 14 13   --   CREATININE 1.10* 1.21* 1.18* 1.01* 0.89  --   CALCIUM 8.7* 8.9 9.1 9.0 9.0  --   MG 2.0 1.9 1.9  1.8 1.8 1.9  PHOS 1.5* 2.3* 1.9* 1.9* 1.9* 1.9*   GFR: Estimated Creatinine Clearance: 58.6 mL/min (by C-G formula based on SCr of 0.89 mg/dL).  CBC: Recent Labs  Lab 05/06/19 0533 05/07/19 0419 05/08/19 0322 05/09/19 0343 05/11/19 0550  WBC 7.3 13.4* 9.4 8.7  --   NEUTROABS 5.0  --   --   --   --   HGB 11.8* 11.0* 10.7* 10.1* 10.7*  HCT 39.5 35.6* 35.0* 33.5* 35.1*  MCV 96.8 96.2 97.0 96.8  --   PLT 254 239 254 249  --     Procedures:  EGD on 05/03/2019 An esophagitis distally, nonbleeding erosive gastropathy, gastric ulcer with a clean ulcer base, multiple gastric polyps, acquired duodenal stenosis and mucosal change in the duodenum.  Biopsies suggest this chronic inflammatory change.  Negative for malignancy or dysplasia.  Microbiology summarized: [id number] negative.  Assessment & Plan: Acquired duodenal stenosis/gastric outlet obstruction -Reportedly not amenable to endoscopy.   -Pathology suggest this chronic inflammatory change.  Negative for malignancy or dysplasia. -No evidence of malignancy per pathology and general surgery. -Laparoscopic gastrojejunostomy with feeding GJ tube placement on 05/06/2019 by Dr. Johney Maine -NG tube discontinued 8/5 -GI rec- repeat EGD in 4 to 6 weeks with Dr. Silverio Decamp to reevaluate the duodenal stricture. -J-tube clogged after stopping tube feed overnight due to abdominal pain. -About 1.2 L output from G-tube.  Continue Reglan and azithromycin.  BS returning. -Continue D5-NS -Closely monitor electrolytes and replenish aggressively. -PT/OT recommended HH with 24-hour supervision -Mobilize  Mild leukocytosis: Likely due to marginalization from surgery.  Resolved. -Recheck in the morning  AKI: Likely due to GI loss from nausea and vomiting.  Resolved. -Monitor renal function  Mild hypophosphatemia: Likely due to malnutrition while n.p.o.  -IV sodium phosphate 20 mmol once.  Hypertension: Normotensive -Hold home losartan and diuretics.   GERD: -Continue PPI  Hypothyroidism: -Continue IV Synthroid.  No crushed meds per J-tube, only solution.  Primary hyperparathyroidism: Stable -On Reclast outpatient.  Obstructive sleep apnea -Nightly CPAP if she tolerates but difficult with NG tube.  Class II obesity: BMI 36.94 -Encourage lifestyle change to lose weight  Nutrition: Tube feed -TF as above.  DVT prophylaxis: Subcu heparin Code Status: Full code Family Communication: Patient and/or RN. Available if any question.  Disposition Plan: Remains inpatient until she tolerates tube feed at goal rate. Consultants: GI, general surgery   Antimicrobials: Anti-infectives (From admission, onward)   Start     Dose/Rate Route Frequency Ordered Stop   05/08/19 2200  erythromycin ethylsuccinate (EES) 200 MG/5ML suspension 400 mg     400 mg Per J Tube Every 8 hours 05/08/19 1531     05/06/19 2300  cefoTEtan (CEFOTAN) 2 g in sodium chloride 0.9 % 100 mL IVPB     2 g  200 mL/hr over 30 Minutes Intravenous Every 12 hours 05/06/19 1540 05/06/19 2255   05/06/19 2200  erythromycin ethylsuccinate (EES) 200 MG/5ML suspension 400 mg  Status:  Discontinued     400 mg Oral Every 8 hours 05/06/19 1606 05/08/19 1531   05/06/19 1600  erythromycin (EES) 400 MG/5ML suspension 400 mg  Status:  Discontinued     400 mg Per Tube 3 times daily 05/06/19 1540 05/06/19 1605   05/06/19 0815  cefoTEtan (CEFOTAN) 2 g in sodium chloride 0.9 % 100 mL IVPB     2 g 200 mL/hr over 30 Minutes Intravenous On call to O.R. 05/06/19 0808 05/06/19 1105      Sch Meds:  Scheduled Meds: . chlorhexidine  15 mL Mouth Rinse BID  . erythromycin ethylsuccinate  400 mg Per J Tube Q8H  . feeding supplement (OSMOLITE 1.5 CAL)  1,000 mL Per Tube Q24H  . levothyroxine  25 mcg Intravenous Daily  . lip balm  1 application Topical BID  . mouth rinse  15 mL Mouth Rinse q12n4p  . pantoprazole (PROTONIX) IV  40 mg Intravenous Q12H  . sertraline  50 mg Oral Daily   Continuous  Infusions: . sodium chloride Stopped (05/05/19 1505)  . dextrose 5 % and 0.9% NaCl 60 mL/hr at 05/11/19 0600  . methocarbamol (ROBAXIN) IV Stopped (05/11/19 0353)  . sodium phosphate  Dextrose 5% IVPB 20 mmol (05/11/19 1100)   PRN Meds:.sodium chloride, acetaminophen, HYDROcodone-acetaminophen, magic mouthwash, menthol-cetylpyridinium, methocarbamol (ROBAXIN) IV, morphine injection, ondansetron **OR** ondansetron (ZOFRAN) IV, phenol, sodium chloride flush, sodium chloride flush   [facility] T. Goldsmith  If 7PM-7AM, please contact night-coverage [email or web address] Password [email or web address] 05/11/2019, 11:57 AM

## 2019-05-12 ENCOUNTER — Encounter: Payer: Self-pay | Admitting: Gastroenterology

## 2019-05-12 LAB — BASIC METABOLIC PANEL
Anion gap: 4 — ABNORMAL LOW (ref 5–15)
BUN: 12 mg/dL (ref 8–23)
CO2: 25 mmol/L (ref 22–32)
Calcium: 9 mg/dL (ref 8.9–10.3)
Chloride: 109 mmol/L (ref 98–111)
Creatinine, Ser: 0.91 mg/dL (ref 0.44–1.00)
GFR calc Af Amer: >60 mL/min (ref >60)
GFR calc non Af Amer: 59 mL/min — ABNORMAL LOW (ref >60)
Glucose, Bld: 96 mg/dL (ref 70–99)
Potassium: 3.8 mmol/L (ref 3.5–5.1)
Sodium: 138 mmol/L (ref 135–145)

## 2019-05-12 LAB — GLUCOSE, CAPILLARY
Glucose-Capillary: 76 mg/dL (ref 70–99)
Glucose-Capillary: 80 mg/dL (ref 70–99)
Glucose-Capillary: 82 mg/dL (ref 70–99)
Glucose-Capillary: 85 mg/dL (ref 70–99)
Glucose-Capillary: 97 mg/dL (ref 70–99)

## 2019-05-12 LAB — PHOSPHORUS: Phosphorus: 2.3 mg/dL — ABNORMAL LOW (ref 2.5–4.6)

## 2019-05-12 LAB — MAGNESIUM: Magnesium: 1.7 mg/dL (ref 1.7–2.4)

## 2019-05-12 LAB — HEMOGLOBIN AND HEMATOCRIT, BLOOD
HCT: 33 % — ABNORMAL LOW (ref 36.0–46.0)
Hemoglobin: 10.2 g/dL — ABNORMAL LOW (ref 12.0–15.0)

## 2019-05-12 MED ORDER — PANCRELIPASE (LIP-PROT-AMYL) 10440-39150 UNITS PO TABS
20880.0000 [IU] | ORAL_TABLET | Freq: Once | ORAL | Status: AC
  Administered 2019-05-12: 20880 [IU]
  Filled 2019-05-12: qty 2

## 2019-05-12 MED ORDER — SODIUM CHLORIDE 0.9 % IV BOLUS
500.0000 mL | Freq: Once | INTRAVENOUS | Status: AC
  Administered 2019-05-12: 500 mL via INTRAVENOUS

## 2019-05-12 MED ORDER — MAGNESIUM SULFATE IN D5W 1-5 GM/100ML-% IV SOLN
1.0000 g | Freq: Once | INTRAVENOUS | Status: AC
  Administered 2019-05-12: 1 g via INTRAVENOUS
  Filled 2019-05-12: qty 100

## 2019-05-12 MED ORDER — SODIUM BICARBONATE 650 MG PO TABS
650.0000 mg | ORAL_TABLET | Freq: Once | ORAL | Status: AC
  Administered 2019-05-12: 650 mg
  Filled 2019-05-12: qty 1

## 2019-05-12 NOTE — Progress Notes (Signed)
Physical Therapy Treatment Patient Details Name: [name] MRN: [id number] DOB: 1935/02/07 Today's Date: 05/12/2019    History of Present Illness Pt is an 84 year old female admitted for Gastric Outlet Obstruction from narrowing at D1/D2 and S/P laparoscopic gastrojejunostomy, feeding gastrojejunostomy tube, Dr. Johney Maine, 08/04.  PMH includes:  sleep apnea, osteoporosis, HTN, glaucoma, depression, DM    PT Comments    Assisted OOB to amb + 2 assist for safety.  General bed mobility comments: cues for log roll, and assist to roll, and lift trunk General transfer comment: assist to move into standing, assist to steady, and for walker safety    assisted to [facility].  General Gait Details: + 2 assist for safety such that recliner was following.  Limited distance due to weakness/fatigue and increased ABD pain.  Pt progressing with her mobility.    Follow Up Recommendations  Home health PT;Supervision/Assistance - 24 hour     Equipment Recommendations  Hospital bed    Recommendations for Other Services       Precautions / Restrictions Precautions Precautions: Fall Precaution Comments: G tube, J tube, NPO x ICE chips Restrictions Weight Bearing Restrictions: No    Mobility  Bed Mobility   Bed Mobility: Rolling;Sidelying to Sit;Sit to Sidelying Rolling: Min assist Sidelying to sit: Min assist     Sit to sidelying: Mod assist General bed mobility comments: cues for log roll, and assist to roll, and lift trunk   Transfers Overall transfer level: Needs assistance Equipment used: Rolling walker (2 wheeled) Transfers: Sit to/from [facility] Sit to Stand: Min assist Stand pivot transfers: Min assist;+2 physical assistance       General transfer comment: assist to move into standing, assist to steady, and for walker safety    assisted to [facility]  Ambulation/Gait Ambulation/Gait assistance: +2 safety/equipment;Min guard;Min assist Gait Distance (Feet): 55  Feet Assistive device: Rolling walker (2 wheeled) Gait Pattern/deviations: Step-to pattern;Step-through pattern Gait velocity: decreased   General Gait Details: + 2 assist for safety such that recliner was following.  Limited distance due to weakness/fatigue and increased ABD pain   Stairs             Wheelchair Mobility    Modified Rankin (Stroke Patients Only)       Balance                                            Cognition Arousal/Alertness: Awake/alert Behavior During Therapy: WFL for tasks assessed/performed Overall Cognitive Status: Within Functional Limits for tasks assessed                                 General Comments: sweet      Exercises      General Comments        Pertinent Vitals/Pain Pain Assessment: Faces Faces Pain Scale: Hurts little more Pain Location: abdomen Pain Descriptors / Indicators: Guarding;Grimacing Pain Intervention(s): Monitored during session    Home Living                      Prior Function            PT Goals (current goals can now be found in the care plan section) Progress towards PT goals: Progressing toward goals    Frequency    Min  3X/week      PT Plan Current plan remains appropriate    Co-evaluation              AM-PAC PT "6 Clicks" Mobility   Outcome Measure  Help needed turning from your back to your side while in a flat bed without using bedrails?: A Little Help needed moving from lying on your back to sitting on the side of a flat bed without using bedrails?: A Little Help needed moving to and from a bed to a chair (including a wheelchair)?: A Little Help needed standing up from a chair using your arms (e.g., wheelchair or bedside chair)?: A Little Help needed to walk in hospital room?: A Lot Help needed climbing 3-5 steps with a railing? : A Lot 6 Click Score: 16    End of Session   Activity Tolerance: Patient limited by fatigue;Patient  limited by pain Patient left: in chair;with call bell/phone within reach Nurse Communication: Mobility status PT Visit Diagnosis: Difficulty in walking, not elsewhere classified (R26.2)     Time: 1536- 1609    Charges:  $Gait Training: 8-22 mins $Therapeutic Activity: 8-22 mins                     Rica Koyanagi  PTA Acute  Rehabilitation Services Pager      [phone] Office      [phone]

## 2019-05-12 NOTE — Progress Notes (Signed)
Attempted times two to unclog patient J-tube. Unsuccessful. Notified Jackson Latino PA

## 2019-05-12 NOTE — Progress Notes (Signed)
[facility]/Trauma Progress Note  6 Days Post-Op   Assessment/Plan HTN T2DM - diet controlled AKI - Cr 1.14 Hypothyroidism Hyperparathyroidism GERD OSA Obesity - BMI 36.94  Gastric Outlet Obstruction from narrowing at D1/D2 - s/p EGD 8/1 Dr. Rush Landmark - malignant appearing stricture at D1/D2 - biopsieswere neg for dysplasia or malignancy -prealbumin<5 - S/P laparoscopic gastrojejunostomy, feeding gastrojejunostomy tube, Dr. Johney Maine, 08/04 - J tube for TF's,currently clogged due to stopping tube feeds and not flushing tube, ordered the unclogging tube protocol and discussed with nurse - NO CRUSHED MEDS PER J TUBE, SOLUTIONS ONLY - G tube to gravity - hycet per J tube for pain - Suppository - Patient needs to mobilize   FEN: NPO, IVF, ice chips and sips okay VTE: SCDs, SQ heparin ID: Erythromycin to assist with GI motility, afeb, no leukocytosis Foley:none Follow up:Dr. Johney Maine  Plan: Attempt to unclog J-tube today, patient needs to mobilize   LOS: 11 days    Subjective: CC: Abdominal pain  Patient states she is feeling better today than she did on Saturday.  She said Saturday was a bad day.  Patient denies nausea or vomiting.  She is having flatus.  No BM.  Patient states she has not been out of bed to walk the hallway since admission.  She says she has been back and forth from the bathroom but has not left her room to walk.  I discussed the importance of ambulation.  Objective: Vital signs in last 24 hours: Temp:  [98.6 F (37 C)-99.2 F (37.3 C)] 98.9 F (37.2 C) (08/10 0447) Pulse Rate:  [71-81] 71 (08/10 0447) Resp:  [16-18] 16 (08/10 0447) BP: (116-127)/(62-85) 122/69 (08/10 0447) SpO2:  [95 %-99 %] 99 % (08/10 0447) Weight:  [108.6 kg] 108.6 kg (08/10 0447) Last BM Date: 05/01/19  Intake/Output from previous day: 08/09 0701 - 08/10 0700 In: 1553.7 [P.O.:480; I.V.:1073.7] Out: 1025 [Urine:600; Drains:425] Intake/Output this  shift: No intake/output data recorded.  PE: Gen: Alert, NAD, pleasant, cooperative Pulm:Rate andeffort normal Abd: Soft,obese,ND,+BS, incisions with minimal glue intact appear well and are without signs of infection,GJ tube in place and site C/D/I, mild TTPof left hemiabdomenwithout guarding, no peritonitis, G tube drainage is bilious Skin: no rashes noted, warm and dry   Anti-infectives: Anti-infectives (From admission, onward)   Start     Dose/Rate Route Frequency Ordered Stop   05/11/19 1800  erythromycin ethylsuccinate (EES) 200 MG/5ML suspension 400 mg     400 mg Per J Tube Every 8 hours 05/11/19 1808     05/08/19 2200  erythromycin ethylsuccinate (EES) 200 MG/5ML suspension 400 mg  Status:  Discontinued     400 mg Per J Tube Every 8 hours 05/08/19 1531 05/11/19 1808   05/06/19 2300  cefoTEtan (CEFOTAN) 2 g in sodium chloride 0.9 % 100 mL IVPB     2 g 200 mL/hr over 30 Minutes Intravenous Every 12 hours 05/06/19 1540 05/06/19 2255   05/06/19 2200  erythromycin ethylsuccinate (EES) 200 MG/5ML suspension 400 mg  Status:  Discontinued     400 mg Oral Every 8 hours 05/06/19 1606 05/08/19 1531   05/06/19 1600  erythromycin (EES) 400 MG/5ML suspension 400 mg  Status:  Discontinued     400 mg Per Tube 3 times daily 05/06/19 1540 05/06/19 1605   05/06/19 0815  cefoTEtan (CEFOTAN) 2 g in sodium chloride 0.9 % 100 mL IVPB     2 g 200 mL/hr over 30 Minutes Intravenous On call to O.R. 05/06/19 6314  05/06/19 1105      Lab Results:  Recent Labs    05/11/19 0550 05/12/19 0620  HGB 10.7* 10.2*  HCT 35.1* 33.0*   BMET Recent Labs    05/10/19 0426 05/12/19 0620  NA 141 138  K 3.8 3.8  CL 113* 109  CO2 24 25  GLUCOSE 117* 96  BUN 13 12  CREATININE 0.89 0.91  CALCIUM 9.0 9.0   PT/INR No results for input(s): LABPROT, INR in the last 72 hours. CMP     Component Value Date/Time   NA 138 05/12/2019 0620   K 3.8 05/12/2019 0620   CL 109 05/12/2019 0620   CO2 25  05/12/2019 0620   GLUCOSE 96 05/12/2019 0620   BUN 12 05/12/2019 0620   CREATININE 0.91 05/12/2019 0620   CREATININE 1.09 (H) 08/04/2015 1346   CALCIUM 9.0 05/12/2019 0620   PROT 4.4 (L) 05/10/2019 0426   PROT 6.5 03/17/2014 1348   ALBUMIN 1.9 (L) 05/10/2019 0426   AST 19 05/10/2019 0426   ALT 18 05/10/2019 0426   ALKPHOS 98 05/10/2019 0426   BILITOT 0.4 05/10/2019 0426   GFRNONAA 59 (L) 05/12/2019 0620   GFRAA >60 05/12/2019 0620   Lipase     Component Value Date/Time   LIPASE 30 05/01/2019 1524    Studies/Results: No results found.    Kalman Drape , [facility] Surgery 05/12/2019, 9:47 AM  Pager: [phone] Mon-Wed, Friday 7:00am-4:30pm Thurs 7am-11:30am  Consults: [phone]

## 2019-05-12 NOTE — Progress Notes (Signed)
PROGRESS NOTE  [name] UTM:[id number] DOB: 01/28/1935   PCP: Burnard Bunting, MD  Patient is from: Home  DOA: 05/01/2019 LOS: 86  Brief Narrative / Interim history: 84 year old female with history of DM-2, GERD, HTN, morbid obesity, hyperlipidemia, diverticulosis, hyperparathyroidism, anxiety disorder and bronchiectasis; admitted on 05/01/2019, presented with complaint of nausea and vomiting, was found to have gastric outlet obstruction concerning for malignancy. Had EGD by gastroenterology on 05/03/2019 revealed a esophagitis, nonbleeding erosive gastropathy, gastric ulcer with a clean ulcer base, multiple gastric polyps, acquired duodenal stenosis and mucosal change in the duodenum.  Biopsy suggested scarring from chronic inflammatory changes but negative for malignancy or dysplasia.  Underwent laparoscopic gastrojejunostomy and placement of feeding gastrojejunostomy tube by Dr. Johney Maine on 05/06/2019.  Subjective: No major events overnight of this morning.  Feeding tube remains unclogged.  General surgery ordered the unclogging tube protocol and discussed with RN.  Patient reports having good night.  She has no complaint this morning.  She denies chest pain, dyspnea, nausea, vomiting or abdominal pain.  She denies urinary symptoms.   Objective: Vitals:   05/11/19 2041 05/12/19 0447 05/12/19 1100 05/12/19 1338  BP: 127/85 122/69  (!) 116/48  Pulse: 81 71  77  Resp: 18 16  16   Temp: 99.2 F (37.3 C) 98.9 F (37.2 C)  98.7 F (37.1 C)  TempSrc: Oral Oral  Oral  SpO2: 95% 99%  93%  Weight:  108.6 kg 107.5 kg   Height:        Intake/Output Summary (Last 24 hours) at 05/12/2019 1343 Last data filed at 05/12/2019 0900 Gross per 24 hour  Intake 893.69 ml  Output 925 ml  Net -31.31 ml   Filed Weights   05/08/19 0602 05/12/19 0447 05/12/19 1100  Weight: 108.7 kg 108.6 kg 107.5 kg    Examination:  GENERAL: No acute distress.  Appears well.  HEENT: MMM.  Vision and hearing  grossly intact.  NECK: Supple.  No apparent JVD.  RESP:  No IWOB. Good air movement bilaterally. CVS:  RRR. Heart sounds normal.  ABD/GI/GU: Bowel sounds present. Soft.  Mild tenderness.  GJ tube.  MSK/EXT:  Moves extremities. No apparent deformity or edema.  SKIN: no apparent skin lesion or wound NEURO: Awake, alert and oriented appropriately.  No gross deficit.  PSYCH: Calm. Normal affect.  I have personally reviewed the following labs and images:  Radiology Studies: No results found.  Microbiology: No results found for this or any previous visit (from the past 240 hour(s)).  Sepsis Labs: Invalid input(s): PROCALCITONIN, LACTICIDVEN  Urine analysis:    Component Value Date/Time   COLORURINE YELLOW 05/02/2019 0832   APPEARANCEUR CLEAR 05/02/2019 0832   LABSPEC 1.017 05/02/2019 0832   PHURINE 5.0 05/02/2019 0832   GLUCOSEU NEGATIVE 05/02/2019 0832   HGBUR SMALL (A) 05/02/2019 0832   BILIRUBINUR NEGATIVE 05/02/2019 0832   KETONESUR NEGATIVE 05/02/2019 0832   PROTEINUR NEGATIVE 05/02/2019 0832   UROBILINOGEN 0.2 06/01/2010 0915   NITRITE NEGATIVE 05/02/2019 0832   LEUKOCYTESUR SMALL (A) 05/02/2019 0832    Anemia Panel: No results for input(s): VITAMINB12, FOLATE, FERRITIN, TIBC, IRON, RETICCTPCT in the last 72 hours.  Thyroid Function Tests: No results for input(s): TSH, T4TOTAL, FREET4, T3FREE, THYROIDAB in the last 72 hours.  Lipid Profile: No results for input(s): CHOL, HDL, LDLCALC, TRIG, CHOLHDL, LDLDIRECT in the last 72 hours.  CBG: Recent Labs  Lab 05/11/19 1148 05/11/19 1748 05/12/19 0018 05/12/19 0501 05/12/19 1158  GLUCAP 99 89 82 76 97  HbA1C: No results for input(s): HGBA1C in the last 72 hours.  BNP (last 3 results): No results for input(s): PROBNP in the last 8760 hours.  Cardiac Enzymes: No results for input(s): CKTOTAL, CKMB, CKMBINDEX, TROPONINI in the last 168 hours.  Coagulation Profile: No results for input(s): INR, PROTIME in  the last 168 hours.  Liver Function Tests: Recent Labs  Lab 05/06/19 0533 05/07/19 0419 05/08/19 0322 05/10/19 0426  AST 20 44* 23 19  ALT 18 32 25 18  ALKPHOS 94 101 91 98  BILITOT 0.3 0.2* 0.3 0.4  PROT 4.8* 4.6* 4.4* 4.4*  ALBUMIN 2.1* 2.1* 2.0* 1.9*   No results for input(s): LIPASE, AMYLASE in the last 168 hours. No results for input(s): AMMONIA in the last 168 hours.  Basic Metabolic Panel: Recent Labs  Lab 05/07/19 0419 05/08/19 0322 05/09/19 0343 05/10/19 0426 05/11/19 0550 05/12/19 0620  NA 139 141 142 141  --  138  K 4.2 3.8 4.0 3.8  --  3.8  CL 114* 115* 114* 113*  --  109  CO2 21* 24 25 24   --  25  GLUCOSE 122* 135* 136* 117*  --  96  BUN 12 13 14 13   --  12  CREATININE 1.21* 1.18* 1.01* 0.89  --  0.91  CALCIUM 8.9 9.1 9.0 9.0  --  9.0  MG 1.9 1.9 1.8 1.8 1.9 1.7  PHOS 2.3* 1.9* 1.9* 1.9* 1.9* 2.3*   GFR: Estimated Creatinine Clearance: 56.9 mL/min (by C-G formula based on SCr of 0.91 mg/dL).  CBC: Recent Labs  Lab 05/06/19 0533 05/07/19 0419 05/08/19 0322 05/09/19 0343 05/11/19 0550 05/12/19 0620  WBC 7.3 13.4* 9.4 8.7  --   --   NEUTROABS 5.0  --   --   --   --   --   HGB 11.8* 11.0* 10.7* 10.1* 10.7* 10.2*  HCT 39.5 35.6* 35.0* 33.5* 35.1* 33.0*  MCV 96.8 96.2 97.0 96.8  --   --   PLT 254 239 254 249  --   --     Procedures:  EGD on 05/03/2019 An esophagitis distally, nonbleeding erosive gastropathy, gastric ulcer with a clean ulcer base, multiple gastric polyps, acquired duodenal stenosis and mucosal change in the duodenum.  Biopsies suggest this chronic inflammatory change.  Negative for malignancy or dysplasia.  Microbiology summarized: [id number] negative.  Assessment & Plan: Acquired duodenal stenosis/gastric outlet obstruction -Reportedly not amenable to endoscopy.   -Pathology suggest this chronic inflammatory change.  Negative for malignancy or dysplasia. -No evidence of malignancy per pathology and general surgery.  -Laparoscopic gastrojejunostomy with feeding GJ tube placement on 05/06/2019 by Dr. Johney Maine -NG tube discontinued 8/5 -GI rec- repeat EGD in 4 to 6 weeks with Dr. Silverio Decamp to reevaluate the duodenal stricture. -J-tube remains clogged.  Surgery ordered the unclogging protocol.  If not successful, will have to increase D5/NS rate until fixed.  CBG in the 70s and 80s. -Continue Reglan and azithromycin.  BS returning. -Closely monitor electrolytes and replenish aggressively. -PT/OT recommended HH with 24-hour supervision, hospital bed and 3 in 1 commode. -Mobilize  Mild leukocytosis: Likely due to marginalization from surgery.  Resolved. -Recheck in the morning  AKI: Likely due to GI loss from nausea and vomiting.  Resolved. -Monitor renal function  Mild hypophosphatemia: Improved after replenishment. -Continue monitoring  Hypertension: Normotensive -Hold home losartan and diuretics.  GERD: -Continue PPI  Hypothyroidism: -Continue IV Synthroid.  No crushed meds per J-tube, only solution.  Primary hyperparathyroidism: Stable -On Reclast  outpatient.  Obstructive sleep apnea -Nightly CPAP if she tolerates but difficult with NG tube.  Class II obesity: BMI 36.94 -Encourage lifestyle change to lose weight in the future.  Nutrition: Tube feed -IV fluid Angio-Seal as above  DVT prophylaxis: Subcu heparin Code Status: Full code Family Communication: Patient and/or RN. Available if any question.  Disposition Plan: Remains inpatient until she tolerates tube feed at goal rate and cleared by general surgery.. Consultants: GI, general surgery   Antimicrobials: Anti-infectives (From admission, onward)   Start     Dose/Rate Route Frequency Ordered Stop   05/11/19 1800  erythromycin ethylsuccinate (EES) 200 MG/5ML suspension 400 mg     400 mg Per J Tube Every 8 hours 05/11/19 1808     05/08/19 2200  erythromycin ethylsuccinate (EES) 200 MG/5ML suspension 400 mg  Status:  Discontinued      400 mg Per J Tube Every 8 hours 05/08/19 1531 05/11/19 1808   05/06/19 2300  cefoTEtan (CEFOTAN) 2 g in sodium chloride 0.9 % 100 mL IVPB     2 g 200 mL/hr over 30 Minutes Intravenous Every 12 hours 05/06/19 1540 05/06/19 2255   05/06/19 2200  erythromycin ethylsuccinate (EES) 200 MG/5ML suspension 400 mg  Status:  Discontinued     400 mg Oral Every 8 hours 05/06/19 1606 05/08/19 1531   05/06/19 1600  erythromycin (EES) 400 MG/5ML suspension 400 mg  Status:  Discontinued     400 mg Per Tube 3 times daily 05/06/19 1540 05/06/19 1605   05/06/19 0815  cefoTEtan (CEFOTAN) 2 g in sodium chloride 0.9 % 100 mL IVPB     2 g 200 mL/hr over 30 Minutes Intravenous On call to O.R. 05/06/19 0808 05/06/19 1105      Sch Meds:  Scheduled Meds: . chlorhexidine  15 mL Mouth Rinse BID  . erythromycin ethylsuccinate  400 mg Per J Tube Q8H  . feeding supplement (OSMOLITE 1.5 CAL)  1,000 mL Per Tube Q24H  . levothyroxine  25 mcg Intravenous Daily  . lip balm  1 application Topical BID  . mouth rinse  15 mL Mouth Rinse q12n4p  . pantoprazole (PROTONIX) IV  40 mg Intravenous Q12H  . sertraline  50 mg Oral Daily   Continuous Infusions: . sodium chloride Stopped (05/05/19 1505)  . dextrose 5 % and 0.9% NaCl 60 mL/hr at 05/12/19 1002  . methocarbamol (ROBAXIN) IV Stopped (05/11/19 0353)   PRN Meds:.sodium chloride, acetaminophen, HYDROcodone-acetaminophen, magic mouthwash, menthol-cetylpyridinium, methocarbamol (ROBAXIN) IV, morphine injection, ondansetron **OR** ondansetron (ZOFRAN) IV, phenol, sodium chloride flush, sodium chloride flush    T. [place]  If 7PM-7AM, please contact night-coverage [email or web address] Password [email or web address] 05/12/2019, 1:43 PM

## 2019-05-12 NOTE — Progress Notes (Signed)
Notified Dr. Wendee Beavers. Patient's urine output for the entire day is 400. Bladder scan shows 0 ml. MD order to give patient 554ml of NS bolus and increase her IV fluids to 125cc/hr as verbal orders

## 2019-05-12 NOTE — Care Management Important Message (Signed)
Important Message  Patient Details  Name: [name] MRN: [id number] Date of Birth: 03-02-1935   Medicare Important Message Given:  Yes. CMA printed out the IM for Case Management Nurse or CSW to give to the patient.      Sharel Behne 05/12/2019, 11:55 AM

## 2019-05-12 NOTE — Progress Notes (Signed)
Patient is requesting toileting frequently, but is usually only voiding between 3-75 ml at a time.  Since 7 pm, she has voided 155 ml.   We attempted to bladder scan to see if she was retaining, but after 6 staff members attempted to scan, our only results were 0 in bladder, but the bladder never was fully captured.   We will continue to monitor output and may request an in and out cath if the volume does not pick up.  Urine doesn't particularly look  Or smell like a UTI.  It is clear and light yellow.

## 2019-05-13 ENCOUNTER — Encounter (HOSPITAL_COMMUNITY): Payer: Self-pay | Admitting: Surgery

## 2019-05-13 ENCOUNTER — Telehealth: Payer: Self-pay | Admitting: Gastroenterology

## 2019-05-13 LAB — GLUCOSE, CAPILLARY
Glucose-Capillary: 101 mg/dL — ABNORMAL HIGH (ref 70–99)
Glucose-Capillary: 106 mg/dL — ABNORMAL HIGH (ref 70–99)
Glucose-Capillary: 93 mg/dL (ref 70–99)
Glucose-Capillary: 89 mg/dL (ref 70–99)

## 2019-05-13 LAB — BASIC METABOLIC PANEL
Anion gap: 4 — ABNORMAL LOW (ref 5–15)
BUN: 11 mg/dL (ref 8–23)
CO2: 25 mmol/L (ref 22–32)
Calcium: 8.8 mg/dL — ABNORMAL LOW (ref 8.9–10.3)
Chloride: 109 mmol/L (ref 98–111)
Creatinine, Ser: 0.96 mg/dL (ref 0.44–1.00)
GFR calc Af Amer: >60 mL/min (ref >60)
GFR calc non Af Amer: 55 mL/min — ABNORMAL LOW (ref >60)
Glucose, Bld: 107 mg/dL — ABNORMAL HIGH (ref 70–99)
Potassium: 3.5 mmol/L (ref 3.5–5.1)
Sodium: 138 mmol/L (ref 135–145)

## 2019-05-13 LAB — PHOSPHORUS: Phosphorus: 2.1 mg/dL — ABNORMAL LOW (ref 2.5–4.6)

## 2019-05-13 LAB — MAGNESIUM: Magnesium: 1.9 mg/dL (ref 1.7–2.4)

## 2019-05-13 LAB — CBC
HCT: 32.9 % — ABNORMAL LOW (ref 36.0–46.0)
Hemoglobin: 10.3 g/dL — ABNORMAL LOW (ref 12.0–15.0)
MCH: 30.1 pg (ref 26.0–34.0)
MCHC: 31.3 g/dL (ref 30.0–36.0)
MCV: 96.2 fL (ref 80.0–100.0)
Platelets: 348 10*3/uL (ref 150–400)
RBC: 3.42 MIL/uL — ABNORMAL LOW (ref 3.87–5.11)
RDW: 15.1 % (ref 11.5–15.5)
WBC: 6.9 10*3/uL (ref 4.0–10.5)
nRBC: 0 % (ref 0.0–0.2)

## 2019-05-13 MED ORDER — BISACODYL 10 MG RE SUPP: 10.0000 mg | Freq: Every day | RECTAL | Status: DC | PRN

## 2019-05-13 MED ORDER — BISACODYL 10 MG RE SUPP
10.0000 mg | Freq: Once | RECTAL | Status: AC
  Administered 2019-05-13: 10 mg via RECTAL
  Filled 2019-05-13: qty 1

## 2019-05-13 MED ORDER — POTASSIUM PHOSPHATES 15 MMOLE/5ML IV SOLN
30.0000 mmol | Freq: Once | INTRAVENOUS | Status: AC
  Administered 2019-05-13: 30 mmol via INTRAVENOUS
  Filled 2019-05-13: qty 10

## 2019-05-13 NOTE — TOC Progression Note (Signed)
Transition of Care [facility]) - Progression Note    Patient Details  Name: [name] MRN: [id number] Date of Birth: 1934/10/29  Transition of [facility]) CM/SW Contact  Leeroy Cha, RN Phone [phone] 05/13/2019, 2:51 PM  Clinical Narrative:    1450/tct-husband/wants a hospital bed and will need some hhc to teach about feeding tube.        Expected Discharge Plan and Services                                                 Social Determinants of Health (SDOH) Interventions    Readmission Risk Interventions No flowsheet data found.

## 2019-05-13 NOTE — TOC Progression Note (Addendum)
Transition of Care [facility]) - Progression Note    Patient Details  Name: [name] MRN: [id number] Date of Birth: 1935-02-19  [facility] CM/SW Contact  Leeroy Cha, RN Phone Number: 05/13/2019, 4:31 PM  Clinical Narrative:    Hospital ordered through [facility] d[facility] and rn through [facility]tcf-[place] unable to take sent to [place] at home via [place].   Expected Discharge Plan: [place]    Expected Discharge Plan and Services Expected Discharge Plan: [place]   Discharge Planning Services: CM Consult Post Acute Care Choice: Durable Medical Equipment, Home Health Living arrangements for the past 2 months: Single Family Home                 DME Arranged: Hospital bed DME Agency: [employer] Date DME Agency Contacted: 05/13/19 Time DME Agency Contacted: [phone] Representative spoke with at DME Agency: [place]: RN [place] Agency: [place] ([place]) Date [place]: 05/13/19 Time [place]: 1628 Representative spoke with at [place]: Lenna Sciara   Social Determinants of Health ([place]) Interventions    Readmission Risk Interventions No flowsheet data found.

## 2019-05-13 NOTE — Progress Notes (Signed)
Taught patient the importance of ambulation, patient refused to walk/ get in chair last night. Patient stated she will walk in the morning.   Norlene Duel RN, BSN

## 2019-05-13 NOTE — Progress Notes (Signed)
PROGRESS NOTE  [name] EPP:[id number] DOB: Oct 07, 1934   PCP: Burnard Bunting, MD  Patient is from: Home  DOA: 05/01/2019 LOS: 23  Brief Narrative / Interim history: 84 year old female with history of DM-2, GERD, HTN, morbid obesity, hyperlipidemia, diverticulosis, hyperparathyroidism, anxiety disorder and bronchiectasis; admitted on 05/01/2019, presented with complaint of nausea and vomiting, was found to have gastric outlet obstruction concerning for malignancy. Had EGD by gastroenterology on 05/03/2019 revealed a esophagitis, nonbleeding erosive gastropathy, gastric ulcer with a clean ulcer base, multiple gastric polyps, acquired duodenal stenosis and mucosal change in the duodenum.  Biopsy suggested scarring from chronic inflammatory changes but negative for malignancy or dysplasia.  Underwent laparoscopic gastrojejunostomy and placement of feeding gastrojejunostomy tube by Dr. Johney Maine on 05/06/2019.  Subjective: No major events overnight of this morning.  RN unable to unclog the feeding tube using the unclogging protocol.  Remains on IV fluids yesterday and overnight.  She has no complaint this morning.  She denies chest pain, dyspnea, nausea, vomiting or abdominal pain.  I have requested the surgical PA, Legrand Como to look into the feeding tube.  Objective: Vitals:   05/12/19 1338 05/12/19 2259 05/13/19 0500 05/13/19 1342  BP: (!) 116/48 (!) 120/54  116/70  Pulse: 77 75  67  Resp: 16 16  (!) 23  Temp: 98.7 F (37.1 C) 98.5 F (36.9 C)  98 F (36.7 C)  TempSrc: Oral Oral  Oral  SpO2: 93% 94%  97%  Weight:   108.5 kg   Height:        Intake/Output Summary (Last 24 hours) at 05/13/2019 1537 Last data filed at 05/13/2019 1412 Gross per 24 hour  Intake 3459.3 ml  Output 1100 ml  Net 2359.3 ml   Filed Weights   05/12/19 0447 05/12/19 1100 05/13/19 0500  Weight: 108.6 kg 107.5 kg 108.5 kg    Examination:  GENERAL: No acute distress.  HEENT: MMM.  Vision and hearing grossly  intact.  NECK: Supple.  No apparent JVD.  RESP:  No IWOB. Good air movement bilaterally. CVS:  RRR*. Heart sounds normal.  ABD/GI/GU: Bowel sounds present. Soft. Non tender.  GJ tube in place. MSK/EXT:  Moves extremities. No apparent deformity or edema.  SKIN: no apparent skin lesion or wound NEURO: Awake, alert and oriented appropriately.  No gross deficit.  PSYCH: Calm. Normal affect.   I have personally reviewed the following labs and images:  Radiology Studies: No results found.  Microbiology: No results found for this or any previous visit (from the past 240 hour(s)).  Sepsis Labs: Invalid input(s): PROCALCITONIN, LACTICIDVEN  Urine analysis:    Component Value Date/Time   COLORURINE YELLOW 05/02/2019 0832   APPEARANCEUR CLEAR 05/02/2019 0832   LABSPEC 1.017 05/02/2019 0832   PHURINE 5.0 05/02/2019 0832   GLUCOSEU NEGATIVE 05/02/2019 0832   HGBUR SMALL (A) 05/02/2019 0832   BILIRUBINUR NEGATIVE 05/02/2019 0832   KETONESUR NEGATIVE 05/02/2019 0832   PROTEINUR NEGATIVE 05/02/2019 0832   UROBILINOGEN 0.2 06/01/2010 0915   NITRITE NEGATIVE 05/02/2019 0832   LEUKOCYTESUR SMALL (A) 05/02/2019 0832    Anemia Panel: No results for input(s): VITAMINB12, FOLATE, FERRITIN, TIBC, IRON, RETICCTPCT in the last 72 hours.  Thyroid Function Tests: No results for input(s): TSH, T4TOTAL, FREET4, T3FREE, THYROIDAB in the last 72 hours.  Lipid Profile: No results for input(s): CHOL, HDL, LDLCALC, TRIG, CHOLHDL, LDLDIRECT in the last 72 hours.  CBG: Recent Labs  Lab 05/12/19 1158 05/12/19 1710 05/12/19 2354 05/13/19 0621 05/13/19 1135  GLUCAP 97  80 85 101* 106*    HbA1C: No results for input(s): HGBA1C in the last 72 hours.  BNP (last 3 results): No results for input(s): PROBNP in the last 8760 hours.  Cardiac Enzymes: No results for input(s): CKTOTAL, CKMB, CKMBINDEX, TROPONINI in the last 168 hours.  Coagulation Profile: No results for input(s): INR, PROTIME in  the last 168 hours.  Liver Function Tests: Recent Labs  Lab 05/07/19 0419 05/08/19 0322 05/10/19 0426  AST 44* 23 19  ALT 32 25 18  ALKPHOS 101 91 98  BILITOT 0.2* 0.3 0.4  PROT 4.6* 4.4* 4.4*  ALBUMIN 2.1* 2.0* 1.9*   No results for input(s): LIPASE, AMYLASE in the last 168 hours. No results for input(s): AMMONIA in the last 168 hours.  Basic Metabolic Panel: Recent Labs  Lab 05/08/19 0322 05/09/19 0343 05/10/19 0426 05/11/19 0550 05/12/19 0620 05/13/19 0500  NA 141 142 141  --  138 138  K 3.8 4.0 3.8  --  3.8 3.5  CL 115* 114* 113*  --  109 109  CO2 24 25 24   --  25 25  GLUCOSE 135* 136* 117*  --  96 107*  BUN 13 14 13   --  12 11  CREATININE 1.18* 1.01* 0.89  --  0.91 0.96  CALCIUM 9.1 9.0 9.0  --  9.0 8.8*  MG 1.9 1.8 1.8 1.9 1.7 1.9  PHOS 1.9* 1.9* 1.9* 1.9* 2.3* 2.1*   GFR: Estimated Creatinine Clearance: 54.3 mL/min (by C-G formula based on SCr of 0.96 mg/dL).  CBC: Recent Labs  Lab 05/07/19 0419 05/08/19 0322 05/09/19 0343 05/11/19 0550 05/12/19 0620 05/13/19 0500  WBC 13.4* 9.4 8.7  --   --  6.9  HGB 11.0* 10.7* 10.1* 10.7* 10.2* 10.3*  HCT 35.6* 35.0* 33.5* 35.1* 33.0* 32.9*  MCV 96.2 97.0 96.8  --   --  96.2  PLT 239 254 249  --   --  348    Procedures:  EGD on 05/03/2019 An esophagitis distally, nonbleeding erosive gastropathy, gastric ulcer with a clean ulcer base, multiple gastric polyps, acquired duodenal stenosis and mucosal change in the duodenum.  Biopsies suggest this chronic inflammatory change.  Negative for malignancy or dysplasia.  Microbiology summarized: [id number] negative.  Assessment & Plan: Acquired duodenal stenosis/gastric outlet obstruction -Reportedly not amenable to endoscopy.   -Pathology suggest this chronic inflammatory change.  Negative for malignancy or dysplasia. -No evidence of malignancy per pathology and general surgery. -Laparoscopic gastrojejunostomy with feeding GJ tube placement on 05/06/2019 by Dr. Johney Maine  -NG tube discontinued 8/5 -GI rec- repeat EGD in 4 to 6 weeks with Dr. Silverio Decamp to reevaluate the duodenal stricture. -J-tube remains clogged.  I have asked surgery PA, Legrand Como to look into this.  -Continue IV fluids at current rate until G-tube is unclogged. -Continue Reglan and azithromycin.  Encourage mobilization. -Closely monitor electrolytes and replenish aggressively. -PT/OT recommended HH with 24-hour supervision, hospital bed and 3 in 1 commode.  Mild leukocytosis: Likely due to marginalization from surgery.  Resolved. -Recheck in the morning  AKI: Likely due to GI loss from nausea and vomiting.  Resolved. -Monitor renal function  Mild hypophosphatemia: Improved after replenishment. -Continue monitoring  Hypokalemia:  -Replenish and recheck  Hypertension: Normotensive -Hold home losartan and diuretics.  GERD: -Continue PPI  Hypothyroidism: -Continue IV Synthroid.  No crushed meds per J-tube, only solution.  Primary hyperparathyroidism: Stable -On Reclast outpatient.  Obstructive sleep apnea -Nightly CPAP if she tolerates but difficult with NG tube.  Class  II obesity: BMI 36.94 -Encourage lifestyle change to lose weight in the future.  Nutrition: Tube feed -IV fluid until the feeding J-tube was unclogged.  DVT prophylaxis: Subcu heparin Code Status: Full code Family Communication: Updated home. Disposition Plan: Remains inpatient until she tolerates tube feed at goal rate and cleared by general surgery.. Consultants: GI, general surgery   Antimicrobials: Anti-infectives (From admission, onward)   Start     Dose/Rate Route Frequency Ordered Stop   05/11/19 1800  erythromycin ethylsuccinate (EES) 200 MG/5ML suspension 400 mg     400 mg Per J Tube Every 8 hours 05/11/19 1808     05/08/19 2200  erythromycin ethylsuccinate (EES) 200 MG/5ML suspension 400 mg  Status:  Discontinued     400 mg Per J Tube Every 8 hours 05/08/19 1531 05/11/19 1808   05/06/19 2300   cefoTEtan (CEFOTAN) 2 g in sodium chloride 0.9 % 100 mL IVPB     2 g 200 mL/hr over 30 Minutes Intravenous Every 12 hours 05/06/19 1540 05/06/19 2255   05/06/19 2200  erythromycin ethylsuccinate (EES) 200 MG/5ML suspension 400 mg  Status:  Discontinued     400 mg Oral Every 8 hours 05/06/19 1606 05/08/19 1531   05/06/19 1600  erythromycin (EES) 400 MG/5ML suspension 400 mg  Status:  Discontinued     400 mg Per Tube 3 times daily 05/06/19 1540 05/06/19 1605   05/06/19 0815  cefoTEtan (CEFOTAN) 2 g in sodium chloride 0.9 % 100 mL IVPB     2 g 200 mL/hr over 30 Minutes Intravenous On call to O.R. 05/06/19 0808 05/06/19 1105      Sch Meds:  Scheduled Meds: . chlorhexidine  15 mL Mouth Rinse BID  . erythromycin ethylsuccinate  400 mg Per J Tube Q8H  . feeding supplement (OSMOLITE 1.5 CAL)  1,000 mL Per Tube Q24H  . levothyroxine  25 mcg Intravenous Daily  . lip balm  1 application Topical BID  . mouth rinse  15 mL Mouth Rinse q12n4p  . pantoprazole (PROTONIX) IV  40 mg Intravenous Q12H  . sertraline  50 mg Oral Daily   Continuous Infusions: . sodium chloride Stopped (05/05/19 1505)  . dextrose 5 % and 0.9% NaCl 125 mL/hr at 05/13/19 0825  . methocarbamol (ROBAXIN) IV Stopped (05/11/19 0353)   PRN Meds:.sodium chloride, acetaminophen, bisacodyl, HYDROcodone-acetaminophen, magic mouthwash, menthol-cetylpyridinium, methocarbamol (ROBAXIN) IV, morphine injection, ondansetron **OR** ondansetron (ZOFRAN) IV, phenol, sodium chloride flush, sodium chloride flush   Ciarrah Rae T. [place]  If 7PM-7AM, please contact night-coverage [email or web address] Password [facility] 05/13/2019, 3:37 PM

## 2019-05-13 NOTE — Progress Notes (Signed)
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 Johney Maine, 08/04.  PMH includes:  sleep apnea, osteoporosis, HTN, glaucoma, depression, DM   OT comments  Pt progressing toward goals. Ambulated @ 80 ft in hallway with minguard A with 2 rest breaks. Educated pt on use of AE for LB ADL. Began education on level 1 theraband strengthening ex. Continue to recommend Momence. Will follow acutely.   Follow Up Recommendations  Home health OT;Supervision/Assistance - 24 hour    Equipment Recommendations  3 in 1 bedside commode    Recommendations for Other Services      Precautions / Restrictions Precautions Precautions: Fall Precaution Comments: G tube, J tube, NPO x ICE chips       Mobility Bed Mobility Overal bed mobility: Needs Assistance     Sidelying to sit: Min guard     Sit to sidelying: Mod assist(to lift BLE)    Transfers Overall transfer level: Needs assistance   Transfers: Sit to/from Stand;Stand Pivot Transfers Sit to Stand: Min guard Stand pivot transfers: Min guard            Balance                                           ADL either performed or assessed with clinical judgement   ADL Overall ADL's : Needs assistance/impaired Eating/Feeding: NPO   Grooming: Set up;Standing                               Functional mobility during ADLs: Min guard;Rolling walker General ADL Comments: Educated pt on use of AE for LB ADL. Pt verblaizxed understnaidng.      Vision       Perception     Praxis      Cognition Arousal/Alertness: Awake/alert Behavior During Therapy: WFL for tasks assessed/performed Overall Cognitive Status: Within Functional Limits for  tasks assessed                                          Exercises Exercises: Other exercises Other Exercises Other Exercises: general UB level 1 theraband strengthening ex   Shoulder Instructions       General Comments      Pertinent Vitals/ Pain       Pain Assessment: Faces Faces Pain Scale: Hurts little more Pain Location: abdomen Pain Descriptors / Indicators: Guarding;Grimacing Pain Intervention(s): Limited activity within patient's tolerance  Home Living                                          Prior Functioning/Environment              Frequency  Min 2X/week        Progress Toward Goals  OT Goals(current goals can now be found in the care plan section)  Progress towards OT goals: Progressing toward goals  Acute Rehab OT Goals Patient Stated Goal: to learn  how to manage her feedings OT Goal Formulation: With patient Time For Goal Achievement: 05/23/19 Potential to Achieve Goals: Good ADL Goals Pt Will Perform Grooming: with supervision;standing Pt Will Perform Upper Body Bathing: with set-up;sitting Pt Will Perform Lower Body Bathing: with supervision;with adaptive equipment;sit to/from stand Pt Will Perform Upper Body Dressing: with set-up;sitting Pt Will Perform Lower Body Dressing: with supervision;with adaptive equipment;sit to/from stand Pt Will Transfer to Toilet: with supervision;ambulating;regular height toilet Pt Will Perform Toileting - Clothing Manipulation and hygiene: with supervision;sit to/from stand  Plan Discharge plan remains appropriate    Co-evaluation                 AM-PAC OT "6 Clicks" Daily Activity     Outcome Measure   Help from another person eating meals?: None Help from another person taking care of personal grooming?: None Help from another person toileting, which includes using toliet, bedpan, or urinal?: A Lot Help from another person bathing (including washing, rinsing,  drying)?: A Lot Help from another person to put on and taking off regular upper body clothing?: A Little Help from another person to put on and taking off regular lower body clothing?: A Lot 6 Click Score: 17    End of Session Equipment Utilized During Treatment: Gait belt;Rolling walker  OT Visit Diagnosis: Pain Pain - part of body: (abdomen)   Activity Tolerance Patient tolerated treatment well   Patient Left in bed;with call bell/phone within reach;with bed alarm set   Nurse Communication Mobility status        Time: 3716-9678 OT Time Calculation (min): 23 min  Charges: OT General Charges $OT Visit: 1 Visit OT Treatments $Self Care/Home Management : 23-37 mins  Maurie Boettcher, OT/L   Acute OT Clinical Specialist [place] Pager [phone] Office [phone]    [facility] 05/13/2019, 5:14 PM

## 2019-05-13 NOTE — Progress Notes (Addendum)
7 Days Post-Op  Subjective: CC: Abdominal pain Continues to have pain around tube site. No N/V. Passing flatus but no BM. She is walking in her room. J tube still clogged.   Objective: Vital signs in last 24 hours: Temp:  [98.5 F (36.9 C)-98.7 F (37.1 C)] 98.5 F (36.9 C) (08/10 2259) Pulse Rate:  [75-77] 75 (08/10 2259) Resp:  [16] 16 (08/10 2259) BP: (116-120)/(48-54) 120/54 (08/10 2259) SpO2:  [93 %-94 %] 94 % (08/10 2259) Weight:  [107.5 kg-108.5 kg] 108.5 kg (08/11 0500) Last BM Date: 05/01/19  Intake/Output from previous day: 08/10 0701 - 08/11 0700 In: 2149.7 [P.O.:240; I.V.:1909.7] Out: 1325 [Urine:925; Drains:400] Intake/Output this shift: No intake/output data recorded.  PE: Gen: Alert, NAD, pleasant, cooperative Pulm:Rate andeffort normal Abd: Soft,obese,ND,+BS, incisions with minimal glue intact appear well and are without signs of infection,GJ tube in place and site C/D/I, mild TTPof left hemiabdomenwithout guarding, no peritonitis, G tube drainage is bilious. 400cc/24 hours Skin: no rashes noted, warm and dry  Lab Results:  Recent Labs    05/12/19 0620 05/13/19 0500  WBC  --  6.9  HGB 10.2* 10.3*  HCT 33.0* 32.9*  PLT  --  348   BMET Recent Labs    05/12/19 0620 05/13/19 0500  NA 138 138  K 3.8 3.5  CL 109 109  CO2 25 25  GLUCOSE 96 107*  BUN 12 11  CREATININE 0.91 0.96  CALCIUM 9.0 8.8*   PT/INR No results for input(s): LABPROT, INR in the last 72 hours. CMP     Component Value Date/Time   NA 138 05/13/2019 0500   K 3.5 05/13/2019 0500   CL 109 05/13/2019 0500   CO2 25 05/13/2019 0500   GLUCOSE 107 (H) 05/13/2019 0500   BUN 11 05/13/2019 0500   CREATININE 0.96 05/13/2019 0500   CREATININE 1.09 (H) 08/04/2015 1346   CALCIUM 8.8 (L) 05/13/2019 0500   PROT 4.4 (L) 05/10/2019 0426   PROT 6.5 03/17/2014 1348   ALBUMIN 1.9 (L) 05/10/2019 0426   AST 19 05/10/2019 0426   ALT 18 05/10/2019 0426   ALKPHOS 98 05/10/2019  0426   BILITOT 0.4 05/10/2019 0426   GFRNONAA 55 (L) 05/13/2019 0500   GFRAA >60 05/13/2019 0500   Lipase     Component Value Date/Time   LIPASE 30 05/01/2019 1524       Studies/Results: No results found.  Anti-infectives: Anti-infectives (From admission, onward)   Start     Dose/Rate Route Frequency Ordered Stop   05/11/19 1800  erythromycin ethylsuccinate (EES) 200 MG/5ML suspension 400 mg     400 mg Per J Tube Every 8 hours 05/11/19 1808     05/08/19 2200  erythromycin ethylsuccinate (EES) 200 MG/5ML suspension 400 mg  Status:  Discontinued     400 mg Per J Tube Every 8 hours 05/08/19 1531 05/11/19 1808   05/06/19 2300  cefoTEtan (CEFOTAN) 2 g in sodium chloride 0.9 % 100 mL IVPB     2 g 200 mL/hr over 30 Minutes Intravenous Every 12 hours 05/06/19 1540 05/06/19 2255   05/06/19 2200  erythromycin ethylsuccinate (EES) 200 MG/5ML suspension 400 mg  Status:  Discontinued     400 mg Oral Every 8 hours 05/06/19 1606 05/08/19 1531   05/06/19 1600  erythromycin (EES) 400 MG/5ML suspension 400 mg  Status:  Discontinued     400 mg Per Tube 3 times daily 05/06/19 1540 05/06/19 1605   05/06/19 0815  cefoTEtan (CEFOTAN)  2 g in sodium chloride 0.9 % 100 mL IVPB     2 g 200 mL/hr over 30 Minutes Intravenous On call to O.R. 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 Rush Landmark - malignant appearing stricture at D1/D2 - biopsieswere neg for dysplasia or malignancy -prealbumin<5 - S/P laparoscopic gastrojejunostomy, feeding gastrojejunostomy tube, Dr. Johney Maine, 08/04 - POD 7 - J tube for TF's,currently clogged due to stopping tube feeds and not flushing tube, unclogging tube protocol unsuccessful yesterday. Will work on Economist today - NO CRUSHED MEDS PER J TUBE, SOLUTIONS ONLY - G tube to  gravity - hycet per J tube for pain - Suppository - Patient needs to mobilize   FEN: NPO, IVF, ice chips and sips okay VTE: SCDs, SQ heparin ID: Erythromycin to assist with GI motility, afeb, no leukocytosis Foley:none Follow up:Dr. Johney Maine  Plan: Attempt to unclog J-tube today, patient needs to mobilize. Suppository    LOS: 12 days    Jillyn Ledger , [facility] 05/13/2019, 9:23 AM Pager: [phone]

## 2019-05-13 NOTE — Telephone Encounter (Signed)
-----   Message from Alfredia Ferguson, PA-C sent at 05/07/2019 11:50 AM EDT ----- Regarding: office follow up and EGD [name] underwent laparoscopic gastrojejunostomy yesterday, and placement of gastrojejunal feeding tube for her severe duodenal stricture.  Per Dr. gross no obvious malignancy noted at the time of surgery, no resection was done  Plan is for her to stay on high-dose PPI, and she will need repeat EGD with Dr. Quincy Simmonds in 4 to 6 weeks, and probably needs to be seen in the office prior to scheduling procedure.  She will likely be in the hospital another several days. Please arrange office visit with Dr. Silverio Decamp in 3 to 4 weeks, and this will need to be communicated to the patient's husband.Thanks

## 2019-05-13 NOTE — Progress Notes (Signed)
Nutrition Follow-up  DOCUMENTATION CODES:   Obesity unspecified  INTERVENTION:   Monitor magnesium, potassium, and phosphorus daily for at least 3 days, MD to replete as needed, as pt is at risk for refeeding syndrome given poor PO for >1 week, weight loss and low Phos levels.  Once J-tube unclogged: Resume Osmolite 1.5 @ 20 ml/hr, advance by 10 ml every 12 hours to goal rate of 50 ml/hr. At goal rate this provides 1800 kcal, 75g protein and 914 ml H2O.  NUTRITION DIAGNOSIS:   Increased nutrient needs related to post-op healing as evidenced by estimated needs.  Ongoing.  GOAL:   Patient will meet greater than or equal to 90% of their needs  Not meeting. Tube clogged.  MONITOR:   Labs, Weight trends, TF tolerance, I & O's  ASSESSMENT:   84 year old female with history of DM-2, GERD, HTN, morbid obesity, hyperlipidemia, diverticulosis, hyperparathyroidism, anxiety disorder and bronchiectasis; admitted on 05/01/2019, presented with complaint of nausea and vomiting, was found to have gastric outlet obstruction concerning for malignancy.  7/30: admitted with GOO 8/1: EGD revealed small hiatal hernia, gastric ulcer, esophagitis, acquired duodenal stenosis, NGT was placed for suction 8/4: s/p bypass with laparoscopic gastrojejunostomy with feeding gastrojejunostomy tube 8/9: TF stopped d/t clogged J-tube  **RD working remotely**  Tube feeding was advanced to goal rate of 50 ml/hr on 8/8. Pt reported abdominal pain so TF was stopped. Tube then became clogged on 8/9. Per chart review, J-tube continues to be clogged. Unclogging attempts continue today. Once TF can be resumed would advance slowly again as pt has not had any nutrition for 2-3 days now.   Weights are trending upwards. Admission weight: 208 lbs. Now weighs: 239 lbs. Per I/Os: +21.6L since admit. Per nursing documentation, pt has +2 moderate edema in LEs.  Medications: D5 infusion, IV K-Phos  Labs reviewed: CBGs:  85-101 Low Phos Mg WNL  Diet Order:   Diet Order            Diet NPO time specified Except for: Ice Chips, Other (See Comments)  Diet effective midnight              EDUCATION NEEDS:   Not appropriate for education at this time  Skin:  Skin Assessment: Reviewed RN Assessment  Last BM:  7/30  Height:   Ht Readings from Last 1 Encounters:  05/01/19 5\' 3"  (1.6 m)    Weight:   Wt Readings from Last 1 Encounters:  05/13/19 108.5 kg    Ideal Body Weight:  52.3 kg  BMI:  Body mass index is 42.37 kg/m.  Estimated Nutritional Needs:   Kcal:  1650-1850  Protein:  70-80g  Fluid:  1.8L/day  Clayton Bibles, MS, RD, LDN [place] [facility] Pager: [phone] After Hours Pager: [phone]

## 2019-05-13 NOTE — Telephone Encounter (Signed)
Pt's husband reported that pt is at [facility] and has had surgery there.  Husband is unhappy with communication as he has not heard any updates. Marland Kitchen

## 2019-05-13 NOTE — Telephone Encounter (Signed)
Appointment scheduled for 06/11/19 at 1:10 pm. Will call the spouse about the appointment.

## 2019-05-14 DIAGNOSIS — N183 Chronic kidney disease, stage 3 (moderate): Secondary | ICD-10-CM

## 2019-05-14 DIAGNOSIS — R1114 Bilious vomiting: Secondary | ICD-10-CM

## 2019-05-14 LAB — COMPREHENSIVE METABOLIC PANEL
ALT: 19 U/L (ref 0–44)
AST: 20 U/L (ref 15–41)
Albumin: 1.9 g/dL — ABNORMAL LOW (ref 3.5–5.0)
Alkaline Phosphatase: 124 U/L (ref 38–126)
Anion gap: 6 (ref 5–15)
BUN: 9 mg/dL (ref 8–23)
CO2: 23 mmol/L (ref 22–32)
Calcium: 8.6 mg/dL — ABNORMAL LOW (ref 8.9–10.3)
Chloride: 111 mmol/L (ref 98–111)
Creatinine, Ser: 0.87 mg/dL (ref 0.44–1.00)
GFR calc Af Amer: >60 mL/min (ref >60)
GFR calc non Af Amer: >60 mL/min (ref >60)
Glucose, Bld: 113 mg/dL — ABNORMAL HIGH (ref 70–99)
Potassium: 3.8 mmol/L (ref 3.5–5.1)
Sodium: 140 mmol/L (ref 135–145)
Total Bilirubin: 0.3 mg/dL (ref 0.3–1.2)
Total Protein: 4.5 g/dL — ABNORMAL LOW (ref 6.5–8.1)

## 2019-05-14 LAB — CBC
HCT: 32.6 % — ABNORMAL LOW (ref 36.0–46.0)
Hemoglobin: 10.1 g/dL — ABNORMAL LOW (ref 12.0–15.0)
MCH: 30 pg (ref 26.0–34.0)
MCHC: 31 g/dL (ref 30.0–36.0)
MCV: 96.7 fL (ref 80.0–100.0)
Platelets: 356 10*3/uL (ref 150–400)
RBC: 3.37 MIL/uL — ABNORMAL LOW (ref 3.87–5.11)
RDW: 15.2 % (ref 11.5–15.5)
WBC: 7.8 10*3/uL (ref 4.0–10.5)
nRBC: 0 % (ref 0.0–0.2)

## 2019-05-14 LAB — MAGNESIUM: Magnesium: 1.9 mg/dL (ref 1.7–2.4)

## 2019-05-14 LAB — GLUCOSE, CAPILLARY
Glucose-Capillary: 110 mg/dL — ABNORMAL HIGH (ref 70–99)
Glucose-Capillary: 109 mg/dL — ABNORMAL HIGH (ref 70–99)
Glucose-Capillary: 86 mg/dL (ref 70–99)

## 2019-05-14 LAB — PHOSPHORUS: Phosphorus: 2.1 mg/dL — ABNORMAL LOW (ref 2.5–4.6)

## 2019-05-14 MED ORDER — MORPHINE SULFATE (PF) 2 MG/ML IV SOLN
1.0000 mg | INTRAVENOUS | Status: DC | PRN
  Administered 2019-05-15 – 2019-05-18 (×2): 2 mg via INTRAVENOUS
  Filled 2019-05-14 (×2): qty 1

## 2019-05-14 MED ORDER — HYDROCODONE-ACETAMINOPHEN 7.5-325 MG/15ML PO SOLN
5.0000 mL | ORAL | Status: DC | PRN
  Administered 2019-05-15 – 2019-05-18 (×5): 10 mL via ORAL
  Administered 2019-05-19: 7.5 mL via ORAL
  Filled 2019-05-14 (×7): qty 15

## 2019-05-14 MED ORDER — ALTEPLASE 2 MG IJ SOLR
2.0000 mg | Freq: Once | INTRAMUSCULAR | Status: AC
  Administered 2019-05-14: 2 mg
  Filled 2019-05-14: qty 2

## 2019-05-14 NOTE — Progress Notes (Signed)
8 Days Post-Op  Subjective: CC: Abdominal pain J tube was unlcogged yesterday. Tolerating tube feeds. G-tube clamped and toelrated overnight. No N/V. Abdominal pain is minimal compared to yesterday and comes in waves, generalized. Walked 59ft with OT yesterday.   Objective: Vital signs in last 24 hours: Temp:  [98 F (36.7 C)-98.5 F (36.9 C)] 98 F (36.7 C) (08/12 0519) Pulse Rate:  [67-76] 73 (08/12 0519) Resp:  [18-23] 18 (08/12 0519) BP: (109-124)/(54-73) 109/54 (08/12 0519) SpO2:  [95 %-97 %] 95 % (08/12 0519) Weight:  [114.6 kg] 114.6 kg (08/12 0700) Last BM Date: 05/13/19  Intake/Output from previous day: 08/11 0701 - 08/12 0700 In: 3578 [P.O.:590; I.V.:2141.8; NG/GT:360; IV Piggyback:486.2] Out: 575 [Urine:350; Drains:225] Intake/Output this shift: No intake/output data recorded.  PE: Gen: Alert, NAD, pleasant, cooperative Pulm:Rate andeffort normal Abd: Soft,obese,ND,+BS,incisionswith minimal glue intact appear well and are without signs of infection,GJ tube in place and site with some drainage surrounding on dressing. Mild TTPof left hemiabdomenwithout guarding, no peritonitis, G tube clamped. J tube to TF's. 71ml/hr on monitor.  Skin: no rashes noted, warm and dry Msk: 1+ LE edema   Lab Results:  Recent Labs    05/13/19 0500 05/14/19 0500  WBC 6.9 7.8  HGB 10.3* 10.1*  HCT 32.9* 32.6*  PLT 348 356   BMET Recent Labs    05/13/19 0500 05/14/19 0500  NA 138 140  K 3.5 3.8  CL 109 111  CO2 25 23  GLUCOSE 107* 113*  BUN 11 9  CREATININE 0.96 0.87  CALCIUM 8.8* 8.6*   PT/INR No results for input(s): LABPROT, INR in the last 72 hours. CMP     Component Value Date/Time   NA 140 05/14/2019 0500   K 3.8 05/14/2019 0500   CL 111 05/14/2019 0500   CO2 23 05/14/2019 0500   GLUCOSE 113 (H) 05/14/2019 0500   BUN 9 05/14/2019 0500   CREATININE 0.87 05/14/2019 0500   CREATININE 1.09 (H) 08/04/2015 1346   CALCIUM 8.6 (L) 05/14/2019 0500    PROT 4.5 (L) 05/14/2019 0500   PROT 6.5 03/17/2014 1348   ALBUMIN 1.9 (L) 05/14/2019 0500   AST 20 05/14/2019 0500   ALT 19 05/14/2019 0500   ALKPHOS 124 05/14/2019 0500   BILITOT 0.3 05/14/2019 0500   GFRNONAA >60 05/14/2019 0500   GFRAA >60 05/14/2019 0500   Lipase     Component Value Date/Time   LIPASE 30 05/01/2019 1524       Studies/Results: No results found.  Anti-infectives: Anti-infectives (From admission, onward)   Start     Dose/Rate Route Frequency Ordered Stop   05/11/19 1800  erythromycin ethylsuccinate (EES) 200 MG/5ML suspension 400 mg     400 mg Per J Tube Every 8 hours 05/11/19 1808     05/08/19 2200  erythromycin ethylsuccinate (EES) 200 MG/5ML suspension 400 mg  Status:  Discontinued     400 mg Per J Tube Every 8 hours 05/08/19 1531 05/11/19 1808   05/06/19 2300  cefoTEtan (CEFOTAN) 2 g in sodium chloride 0.9 % 100 mL IVPB     2 g 200 mL/hr over 30 Minutes Intravenous Every 12 hours 05/06/19 1540 05/06/19 2255   05/06/19 2200  erythromycin ethylsuccinate (EES) 200 MG/5ML suspension 400 mg  Status:  Discontinued     400 mg Oral Every 8 hours 05/06/19 1606 05/08/19 1531   05/06/19 1600  erythromycin (EES) 400 MG/5ML suspension 400 mg  Status:  Discontinued     400  mg Per Tube 3 times daily 05/06/19 1540 05/06/19 1605   05/06/19 0815  cefoTEtan (CEFOTAN) 2 g in sodium chloride 0.9 % 100 mL IVPB     2 g 200 mL/hr over 30 Minutes Intravenous On call to O.R. 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 Rush Landmark - malignant appearing stricture at [place] - biopsieswere neg for dysplasia or malignancy -prealbumin<5 - S/P laparoscopic gastrojejunostomy, feeding gastrojejunostomy tube, Dr. Johney Maine, 08/04 - POD #8 - J tube for TF's,currently running at goal  of 42ml/hr - NO CRUSHED MEDS PER J TUBE, SOLUTIONS ONLY - G tube clamped. Trial of CLD - Hycet per J tube for pain. Hold off on oral medications -Suppository - Patient needs to mobilize   FEN: CLD, IVF per medicine  VTE: SCDs, SQ heparin BH:[id number] to assist with GI motility, afeb, no leukocytosis Foley:none Follow up:Dr. Gross  Plan: J tube feeds at goal. G tube clamped. Trial of CLD today.    LOS: 13 days    [name] , [facility] 05/14/2019, 8:09 AM Pager: [phone]

## 2019-05-14 NOTE — Progress Notes (Signed)
Patient refusing to ambulate/ sit in chair at this time.  Norlene Duel RN, BSN

## 2019-05-14 NOTE — Progress Notes (Signed)
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 Johney Maine, 08/04.  PMH includes:  sleep apnea, osteoporosis, HTN, glaucoma, depression, DM   OT comments  Pt slowly progressing toward OT goals. Pt required encouragement to engage in therapy session, however, agreeable to sink level activity. Pt required Min A bed mobility for physical assistance of LE. Min guard functional transfer to sink in rest room for safety. VCs required for hand placement and safety when navigating room. Pt will benefit from continued acute OT to address safety in ADLs and strengthen prior to dc setting. DC and freq remains the same. OT will continue to follow acutely.    Follow Up Recommendations  Home health OT;Supervision/Assistance - 24 hour    Equipment Recommendations  3 in 1 bedside commode    Recommendations for Other Services      Precautions / Restrictions Precautions Precautions: Fall Precaution Comments: G tube, J tube, NPO x ICE chips Restrictions Weight Bearing Restrictions: No       Mobility Bed Mobility Overal bed mobility: Needs Assistance Bed Mobility: Sidelying to Sit;Sit to Sidelying   Sidelying to sit: Min guard     Sit to sidelying: Min assist(to lift BLE) General bed mobility comments: Pt required low min A to manage LE to EOB and back in supine.   Transfers Overall transfer level: Needs assistance Equipment used: Rolling walker (2 wheeled) Transfers: Sit to/from Stand Sit to Stand: Min guard Stand pivot transfers: Min guard       General transfer comment: cues for hand placement, effortful rise however no physical assist required, min/guard for safety    Balance Overall balance assessment: Needs  assistance Sitting-balance support: Bilateral upper extremity supported Sitting balance-Leahy Scale: Normal     Standing balance support: Bilateral upper extremity supported Standing balance-Leahy Scale: Poor Standing balance comment: reliant on UE support                            ADL either performed or assessed with clinical judgement   ADL Overall ADL's : Needs assistance/impaired Eating/Feeding: NPO   Grooming: Set up;Standing Grooming Details (indicate cue type and reason): Min guard for safety in standing at sink with RW.                 Toilet Transfer: Magazine features editor Details (indicate cue type and reason): requires increased time          Functional mobility during ADLs: Min guard;Rolling walker General ADL Comments: Pt engaged in sink level grooming tasks to increase self care involvement and to increase functional moblity.      Vision       Perception     Praxis      Cognition Arousal/Alertness: Awake/alert Behavior During Therapy: WFL for tasks assessed/performed Overall Cognitive Status: Within Functional Limits for tasks assessed                                 General Comments: sweet        Exercises     Shoulder Instructions       General Comments pt continues to require encouragement to engage in activities.  Pertinent Vitals/ Pain       Pain Assessment: Faces Pain Score: 4  Faces Pain Scale: Hurts little more Pain Location: abdomen (increases with coughing) (tolerable with ambulation per pt) Pain Descriptors / Indicators: Guarding;Grimacing;Tender Pain Intervention(s): Monitored during session;Repositioned  Home Living                                          Prior Functioning/Environment              Frequency  Min 2X/week        Progress Toward Goals  OT Goals(current goals can now be found in the care plan section)  Progress towards OT goals:  Progressing toward goals  Acute Rehab OT Goals Patient Stated Goal: to learn how to manage her feedings OT Goal Formulation: With patient Time For Goal Achievement: 05/23/19 Potential to Achieve Goals: Good ADL Goals Pt Will Perform Grooming: with supervision;standing Pt Will Perform Upper Body Bathing: with set-up;sitting Pt Will Perform Lower Body Bathing: with supervision;with adaptive equipment;sit to/from stand Pt Will Perform Upper Body Dressing: with set-up;sitting Pt Will Perform Lower Body Dressing: with supervision;with adaptive equipment;sit to/from stand Pt Will Transfer to Toilet: with supervision;ambulating;regular height toilet Pt Will Perform Toileting - Clothing Manipulation and hygiene: with supervision;sit to/from stand  Plan Discharge plan remains appropriate    Co-evaluation                 AM-PAC OT "6 Clicks" Daily Activity     Outcome Measure   Help from another person eating meals?: None Help from another person taking care of personal grooming?: None Help from another person toileting, which includes using toliet, bedpan, or urinal?: A Lot Help from another person bathing (including washing, rinsing, drying)?: A Lot Help from another person to put on and taking off regular upper body clothing?: A Little Help from another person to put on and taking off regular lower body clothing?: A Lot 6 Click Score: 17    End of Session Equipment Utilized During Treatment: Gait belt;Rolling walker  OT Visit Diagnosis: Pain Pain - part of body: (abdomen)   Activity Tolerance Patient tolerated treatment well   Patient Left in bed;with call bell/phone within reach;with bed alarm set;with family/visitor present   Nurse Communication Mobility status        Time: 1350-1411 OT Time Calculation (min): 21 min  Charges: OT General Charges $OT Visit: 1 Visit OT Treatments $Self Care/Home Management : 8-22 mins  Minus Breeding, MSOT, OTR/L  Supplemental  Rehabilitation Services  [phone]    Marius Ditch 05/14/2019, 2:20 PM

## 2019-05-14 NOTE — Progress Notes (Signed)
[facility]                                                                              Patient Demographics  [name], is a 84 y.o. female, DOB - May 05, 1935, GBT:[id number]  Admit date - 05/01/2019   Admitting Physician Elwyn Reach, MD  Outpatient Primary MD for the patient is Burnard Bunting, MD  Outpatient specialists:   LOS - 13  days   Medical records reviewed and are as summarized below:    Chief Complaint  Patient presents with  . Emesis  . Diarrhea       Brief summary   84 year old female with history of DM-2, GERD, HTN, morbid obesity, hyperlipidemia, diverticulosis, hyperparathyroidism, anxiety disorder and bronchiectasis; admitted on7/30/2020, presented with complaint ofnausea and vomiting, was found to havegastric outlet obstruction concerning for malignancy. Had EGD by gastroenterology on 05/03/2019 revealed a esophagitis, nonbleeding erosive gastropathy, gastric ulcer with a clean ulcer base, multiple gastric polyps, acquired duodenal stenosis and mucosal change in the duodenum.  Biopsy suggested scarring from chronic inflammatory changes but negative for malignancy or dysplasia.  Underwent laparoscopic gastrojejunostomy and placement of feeding gastrojejunostomy tube by Dr. Johney Maine on 05/06/2019.  Assessment & Plan    Principal problem Intractable nausea and vomiting, found to have gastric outlet obstruction, acquired duodenal stenosis -GI was consulted, patient underwent EGD on 8/1, pathology suggested chronic inflammatory change, negative for malignancy or dysplasia.  No evidence of malignancy -General surgery was consulted, patient underwent laparoscopic gastrojejunostomy with feeding GJ tube placement on 8/4 ( Dr. Johney Maine) -NG tube discontinued on 8/5 -GI recommended repeating EGD in 4 to 6-week with Dr. Silverio Decamp to reevaluate the duodenal stricture. -J-tube unclogged on 8/11, currently tolerating tube feeds.  Per surgery G-tube  clamped, patient tolerated, trial of clear liquid diet -PT evaluation recommended home health PT with 24-hour supervision -Monitor I's and O's closely, +25L  Mild leukocytosis Resolved  Moderate protein calorie malnutrition Albumin 1.9.  Will start pro-stat once tolerating diet  Acute kidney injury -Likely from GI losses from nausea and vomiting -Presented with creatinine of 2.0, baseline around 1.4 -Creatinine currently 0.8  Mild for hypophosphatemia -Improving, continue monitoring  Hypokalemia -Resolved  Essential hypertension Improving, hold home losartan, diuretics  GERD -Continue PPI  Hypothyroidism Continue IV Synthroid.  Per surgery, no crushed meds per J-tube  Primary hyperparathyroidism Stable, on Reclast outpatient  OSA CPAP if she tolerates  Obesity BMI 36.9, recommended diet and weight control   Code Status: full  DVT Prophylaxis   heparin  Family Communication: Discussed in detail with the patient, all imaging results, lab results explained to the patient    Disposition Plan: Remains inpatient until she tolerates tube feeds, clears, cleared by surgery  Time Spent in minutes 35 minutes  Procedures:  EGD 8/1 8/4 laparoscopic gastrojejunostomy, feeding gastro-jejunostomy tube  Consultants:   Gastroenterology General surgery  Antimicrobials:   Anti-infectives (From admission, onward)   Start     Dose/Rate Route Frequency Ordered Stop   05/11/19 1800  erythromycin ethylsuccinate (EES) 200 MG/5ML suspension 400 mg     400 mg Per J Tube Every 8 hours 05/11/19 1808  05/08/19 2200  erythromycin ethylsuccinate (EES) 200 MG/5ML suspension 400 mg  Status:  Discontinued     400 mg Per J Tube Every 8 hours 05/08/19 1531 05/11/19 1808   05/06/19 2300  cefoTEtan (CEFOTAN) 2 g in sodium chloride 0.9 % 100 mL IVPB     2 g 200 mL/hr over 30 Minutes Intravenous Every 12 hours 05/06/19 1540 05/06/19 2255   05/06/19 2200  erythromycin ethylsuccinate  (EES) 200 MG/5ML suspension 400 mg  Status:  Discontinued     400 mg Oral Every 8 hours 05/06/19 1606 05/08/19 1531   05/06/19 1600  erythromycin (EES) 400 MG/5ML suspension 400 mg  Status:  Discontinued     400 mg Per Tube 3 times daily 05/06/19 1540 05/06/19 1605   05/06/19 0815  cefoTEtan (CEFOTAN) 2 g in sodium chloride 0.9 % 100 mL IVPB     2 g 200 mL/hr over 30 Minutes Intravenous On call to O.R. 05/06/19 0808 05/06/19 1105          Medications  Scheduled Meds: . chlorhexidine  15 mL Mouth Rinse BID  . erythromycin ethylsuccinate  400 mg Per J Tube Q8H  . feeding supplement (OSMOLITE 1.5 CAL)  1,000 mL Per Tube Q24H  . levothyroxine  25 mcg Intravenous Daily  . lip balm  1 application Topical BID  . mouth rinse  15 mL Mouth Rinse q12n4p  . pantoprazole (PROTONIX) IV  40 mg Intravenous Q12H  . sertraline  50 mg Oral Daily   Continuous Infusions: . sodium chloride Stopped (05/05/19 1505)  . dextrose 5 % and 0.9% NaCl 50 mL/hr at 05/13/19 1930  . methocarbamol (ROBAXIN) IV Stopped (05/11/19 0353)   PRN Meds:.sodium chloride, acetaminophen, bisacodyl, HYDROcodone-acetaminophen, magic mouthwash, menthol-cetylpyridinium, methocarbamol (ROBAXIN) IV, morphine injection, ondansetron **OR** ondansetron (ZOFRAN) IV, phenol, sodium chloride flush, sodium chloride flush      Subjective:   Malarie Tappen was seen and examined today.  No acute complaints, tolerating tube feeds.  Mild abdominal pain, 5/10.  Patient denies dizziness, chest pain, shortness of breath, N/V/D/C. No acute events overnight.    Objective:   Vitals:   05/13/19 2123 05/14/19 0519 05/14/19 0700 05/14/19 1331  BP: 124/73 (!) 109/54  113/77  Pulse: 76 73  72  Resp: 20 18  17   Temp: 98.5 F (36.9 C) 98 F (36.7 C)  98.5 F (36.9 C)  TempSrc: Oral Oral  Oral  SpO2: 97% 95%    Weight:   114.6 kg   Height:        Intake/Output Summary (Last 24 hours) at 05/14/2019 1640 Last data filed at 05/14/2019 1334  Gross per 24 hour  Intake 2280.22 ml  Output 450 ml  Net 1830.22 ml     Wt Readings from Last 3 Encounters:  05/14/19 114.6 kg  04/28/19 99.3 kg  04/22/19 99.3 kg     Exam  General: Alert and oriented x 3, NAD  Eyes:  HEENT:  Cardiovascular: S1 S2 auscultated, Regular rate and rhythm.  Respiratory: Decreased breath sound at the bases  Gastrointestinal: Soft, GJ tube in place, mild TTP in left lower abdomen, J tube to tube feeds.   Ext: 1+ pedal edema bilaterally  Neuro: No new deficits  Musculoskeletal: No digital cyanosis, clubbing  Skin: No rashes  Psych: Normal affect and demeanor, alert and oriented x3    Data Reviewed:  I have personally reviewed following labs and imaging studies  Micro Results No results found for this or any previous visit (  from the past 240 hour(s)).  Radiology Reports Ct Abdomen Pelvis Wo Contrast  Result Date: 05/01/2019 CLINICAL DATA:  Abdominal pain, question diverticulitis patient had endoscopy and said she had a blockage entire duodenum EXAM: CT ABDOMEN AND PELVIS WITHOUT CONTRAST TECHNIQUE: Multidetector CT imaging of the abdomen and pelvis was performed following the standard protocol without IV contrast. COMPARISON:  January 28, 2019 FINDINGS: Lower chest: Mitral valve calcifications are seen. The visualized heart is normal in size. No hiatal hernia. The visualized portions of the lungs are clear. Hepatobiliary: Although limited due to the lack of intravenous contrast, normal in appearance without gross focal abnormality. The patient is status post cholecystectomy. No biliary ductal dilation. Pancreas:  Unremarkable.  No surrounding inflammatory changes. Spleen: Normal in size. Although limited due to the lack of intravenous contrast, normal in appearance. Adrenals/Urinary Tract: Both adrenal glands appear normal. Again noted are multiple low-density lesions seen within both kidneys the largest seen in the lower pole of the right kidney  measuring 3.6 cm. There is a slightly hyperdense lesion seen within the posterior midpole of the left kidney measuring 1 cm as on the prior exam and could represent proteinaceous/hemorrhagic cyst. The remainder are likely simple renal cysts. Again noted are multiple bilateral non-obstructing renal calculi. No perinephric stranding is seen. No ureteral or bladder calculi are noted. Stomach/Bowel: There is a dilated fluid and air-filled stomach to the level of the gastroduodenal junction where there is an gradual tapering. There is air and fluid seen within the third portion of the duodenum. The remainder of the small bowel is grossly normal in appearance. There is sigmoid colonic diverticula with question of minimal stranding seen adjacent to the sigmoid colon, series 2, image 66 which could represent mild sigmoid colonic diverticulitis. No pericolonic collection or free air is seen. Vascular/Lymphatic: There are no enlarged abdominal or pelvic lymph nodes. Scattered aortic atherosclerotic calcifications are seen without aneurysmal dilatation. Reproductive: The uterus and adnexa are unremarkable. Other: No evidence of abdominal wall mass or hernia. Musculoskeletal: No acute or significant osseous findings. The patient is status post decompression and fixation from L2 through L4. IMPRESSION: 1. Dilated fluid and air-filled stomach to the level of the gastric duodenal junction, consistent with the patient's history and recent endoscopy. There is limited visualization of the second portion of the duodenum to the lack of oral contrast. If there is question of gastric motility issues would recommend gastric emptying study. 2. Mild sigmoid colonic diverticulitis. No pericolonic abscess or free air Electronically Signed   By: Prudencio Pair M.D.   On: 05/01/2019 17:00   [place] Ekg Site Rite  Result Date: 05/05/2019 If Site Rite image not attached, placement could not be confirmed due to current cardiac rhythm.   Lab Data:   CBC: Recent Labs  Lab 05/08/19 0322 05/09/19 0343 05/11/19 0550 05/12/19 0620 05/13/19 0500 05/14/19 0500  WBC 9.4 8.7  --   --  6.9 7.8  HGB 10.7* 10.1* 10.7* 10.2* 10.3* 10.1*  HCT 35.0* 33.5* 35.1* 33.0* 32.9* 32.6*  MCV 97.0 96.8  --   --  96.2 96.7  PLT 254 249  --   --  348 073   Basic Metabolic Panel: Recent Labs  Lab 05/09/19 0343 05/10/19 0426 05/11/19 0550 05/12/19 0620 05/13/19 0500 05/14/19 0500  NA 142 141  --  138 138 140  K 4.0 3.8  --  3.8 3.5 3.8  CL 114* 113*  --  109 109 111  CO2 25 24  --  25 25 23   GLUCOSE 136* 117*  --  96 107* 113*  BUN 14 13  --  12 11 9   CREATININE 1.01* 0.89  --  0.91 0.96 0.87  CALCIUM 9.0 9.0  --  9.0 8.8* 8.6*  MG 1.8 1.8 1.9 1.7 1.9 1.9  PHOS 1.9* 1.9* 1.9* 2.3* 2.1* 2.1*   GFR: Estimated Creatinine Clearance: 61.9 mL/min (by C-G formula based on SCr of 0.87 mg/dL). Liver Function Tests: Recent Labs  Lab 05/08/19 0322 05/10/19 0426 05/14/19 0500  AST 23 19 20   ALT 25 18 19   ALKPHOS 91 98 124  BILITOT 0.3 0.4 0.3  PROT 4.4* 4.4* 4.5*  ALBUMIN 2.0* 1.9* 1.9*   No results for input(s): LIPASE, AMYLASE in the last 168 hours. No results for input(s): AMMONIA in the last 168 hours. Coagulation Profile: No results for input(s): INR, PROTIME in the last 168 hours. Cardiac Enzymes: No results for input(s): CKTOTAL, CKMB, CKMBINDEX, TROPONINI in the last 168 hours. BNP (last 3 results) No results for input(s): PROBNP in the last 8760 hours. HbA1C: No results for input(s): HGBA1C in the last 72 hours. CBG: Recent Labs  Lab 05/13/19 1135 05/13/19 1737 05/13/19 2122 05/14/19 0452 05/14/19 1148  GLUCAP 106* 93 89 110* 109*   Lipid Profile: No results for input(s): CHOL, HDL, LDLCALC, TRIG, CHOLHDL, LDLDIRECT in the last 72 hours. Thyroid Function Tests: No results for input(s): TSH, T4TOTAL, FREET4, T3FREE, THYROIDAB in the last 72 hours. Anemia Panel: No results for input(s): VITAMINB12, FOLATE, FERRITIN,  TIBC, IRON, RETICCTPCT in the last 72 hours. Urine analysis:    Component Value Date/Time   COLORURINE YELLOW 05/02/2019 0832   APPEARANCEUR CLEAR 05/02/2019 0832   LABSPEC 1.017 05/02/2019 0832   PHURINE 5.0 05/02/2019 0832   GLUCOSEU NEGATIVE 05/02/2019 0832   HGBUR SMALL (A) 05/02/2019 0832   BILIRUBINUR NEGATIVE 05/02/2019 0832   KETONESUR NEGATIVE 05/02/2019 0832   PROTEINUR NEGATIVE 05/02/2019 0832   UROBILINOGEN 0.2 06/01/2010 0915   NITRITE NEGATIVE 05/02/2019 0832   LEUKOCYTESUR SMALL (A) 05/02/2019 0832     Ripudeep Rai M.D. [facility] 05/14/2019, 4:40 PM  Pager: [phone] Between 7am to 7pm - call Pager - [phone]  After 7pm go to [email or web address] - password [email or web address]  Call night coverage person covering after 7pm

## 2019-05-14 NOTE — Progress Notes (Signed)
I called and updated the patient's husband over the phone

## 2019-05-14 NOTE — Progress Notes (Signed)
Physical Therapy Treatment Patient Details Name: [name] MRN: [id number] DOB: April 25, 1935 Today's Date: 05/14/2019    History of Present Illness Pt is an 84 year old female admitted for Gastric Outlet Obstruction from narrowing at D1/D2 and S/P laparoscopic gastrojejunostomy, feeding gastrojejunostomy tube, Dr. Johney Maine, 08/04.  PMH includes:  sleep apnea, osteoporosis, HTN, glaucoma, depression, DM    PT Comments    Pt ambulated in hallway and able to progress distance to 100 feet.  Pt with shaky UEs during ambulation however reports pain is tolerable.  Will continue to assist pt with mobility in preparation for eventual d/c home.  Follow Up Recommendations  Home health PT;Supervision/Assistance - 24 hour     Equipment Recommendations  Hospital bed    Recommendations for Other Services       Precautions / Restrictions Precautions Precautions: Fall Precaution Comments: G tube, J tube, NPO x ICE chips Restrictions Weight Bearing Restrictions: No    Mobility  Bed Mobility               General bed mobility comments: pt up in recliner on arrival  Transfers Overall transfer level: Needs assistance Equipment used: Rolling walker (2 wheeled) Transfers: Sit to/from Stand Sit to Stand: Min guard         General transfer comment: cues for hand placement, effortful rise however no physical assist required, min/guard for safety  Ambulation/Gait Ambulation/Gait assistance: Min guard Gait Distance (Feet): 100 Feet Assistive device: Rolling walker (2 wheeled) Gait Pattern/deviations: Step-through pattern;Decreased stride length Gait velocity: decreased   General Gait Details: pt required 4 standing rest breaks due to fatigue and pain, shaky UEs also observed however pain tolerable per pt (nurse tech in to check blood sugar upon returning to room)   Stairs             Wheelchair Mobility    Modified Rankin (Stroke Patients Only)       Balance                                             Cognition Arousal/Alertness: Awake/alert Behavior During Therapy: WFL for tasks assessed/performed Overall Cognitive Status: Within Functional Limits for tasks assessed                                        Exercises      General Comments        Pertinent Vitals/Pain Pain Assessment: 0-10 Pain Score: 4  Pain Location: abdomen (increases with coughing) (tolerable with ambulation per pt) Pain Descriptors / Indicators: Guarding;Grimacing;Tender Pain Intervention(s): Monitored during session;Repositioned;Premedicated before session(NT reports she is bringing heating pad)    Home Living                      Prior Function            PT Goals (current goals can now be found in the care plan section) Progress towards PT goals: Progressing toward goals    Frequency    Min 3X/week      PT Plan Current plan remains appropriate    Co-evaluation              AM-PAC PT "6 Clicks" Mobility   Outcome Measure  Help needed turning from your back to  your side while in a flat bed without using bedrails?: A Little Help needed moving from lying on your back to sitting on the side of a flat bed without using bedrails?: A Little Help needed moving to and from a bed to a chair (including a wheelchair)?: A Little Help needed standing up from a chair using your arms (e.g., wheelchair or bedside chair)?: A Little Help needed to walk in hospital room?: A Little Help needed climbing 3-5 steps with a railing? : A Lot 6 Click Score: 17    End of Session   Activity Tolerance: Patient limited by fatigue;Patient limited by pain Patient left: in chair;with call bell/phone within reach Nurse Communication: Mobility status PT Visit Diagnosis: Difficulty in walking, not elsewhere classified (R26.2)     Time: 3154-0086 PT Time Calculation (min) (ACUTE ONLY): 17 min  Charges:  $Gait Training: 8-22  mins                     Carmelia Bake, PT, DPT Acute Rehabilitation Services Office: [phone] Pager: [phone]   Trena Platt 05/14/2019, 12:26 PM

## 2019-05-15 LAB — GLUCOSE, CAPILLARY
Glucose-Capillary: 103 mg/dL — ABNORMAL HIGH (ref 70–99)
Glucose-Capillary: 107 mg/dL — ABNORMAL HIGH (ref 70–99)
Glucose-Capillary: 108 mg/dL — ABNORMAL HIGH (ref 70–99)
Glucose-Capillary: 94 mg/dL (ref 70–99)

## 2019-05-15 MED ORDER — LEVOTHYROXINE SODIUM 50 MCG PO TABS
50.0000 ug | ORAL_TABLET | Freq: Every day | ORAL | Status: DC
  Administered 2019-05-16 – 2019-05-19 (×4): 50 ug via ORAL
  Filled 2019-05-15 (×5): qty 1

## 2019-05-15 MED ORDER — PANTOPRAZOLE SODIUM 40 MG PO TBEC
40.0000 mg | DELAYED_RELEASE_TABLET | Freq: Two times a day (BID) | ORAL | Status: DC
  Administered 2019-05-15 – 2019-05-19 (×8): 40 mg via ORAL
  Filled 2019-05-15 (×8): qty 1

## 2019-05-15 NOTE — Progress Notes (Signed)
PHARMACIST - PHYSICIAN COMMUNICATION  CONCERNING: IV to Oral Route Change Policy  RECOMMENDATION: This patient is receiving synthroid and protonix by the intravenous route.  Based on criteria approved by the Pharmacy and Therapeutics Committee, the intravenous medication(s) is/are being converted to the equivalent oral dose form(s).   DESCRIPTION: These criteria include:  The patient is eating (either orally or via tube) and/or has been taking other orally administered medications for a least 24 hours  The patient has no evidence of active gastrointestinal bleeding or impaired GI absorption (gastrectomy, short bowel, patient on TNA or NPO).  If you have questions about this conversion, please contact the Pharmacy Department  []   [phone] )  [facility] []   [phone] )  [facility] []   [phone] )  [facility] []   [phone] )  [facility] [x]   [phone] )  [facility]   Eudelia Bunch, [facility] 05/15/2019 11:29 AM

## 2019-05-15 NOTE — Progress Notes (Signed)
    Durable Medical Equipment  (From admission, onward)         Start     Ordered   05/13/19 1554  For home use only DME Hospital bed  Once    Question Answer Comment  Length of Need Lifetime   The above medical condition requires: Patient requires the ability to reposition frequently   Bed type Semi-electric      05/13/19 1554        reason for hospital bed -hob up 30 degrees Patient requires frequent repositioning which can not be obtained in a  Normal bed.

## 2019-05-15 NOTE — Care Management Important Message (Signed)
Important Message  Patient Details IM Letter given to Velva Harman RN to present to the patient Name: SIEARA BREMER MRN: [id number] Date of Birth: 30-Mar-1935   Medicare Important Message Given:  Yes     Kerin Salen 05/15/2019, 10:05 AM

## 2019-05-15 NOTE — Progress Notes (Signed)
[facility]                                                                              Patient Demographics  [name], is a 84 y.o. female, DOB - 1935-03-20, QMG:[id number]  Admit date - 05/01/2019   Admitting Physician Elwyn Reach, MD  Outpatient Primary MD for the patient is Burnard Bunting, MD  Outpatient specialists:   LOS - 14  days   Medical records reviewed and are as summarized below:    Chief Complaint  Patient presents with  . Emesis  . Diarrhea       Brief summary   84 year old female with history of DM-2, GERD, HTN, morbid obesity, hyperlipidemia, diverticulosis, hyperparathyroidism, anxiety disorder and bronchiectasis; admitted on7/30/2020, presented with complaint ofnausea and vomiting, was found to havegastric outlet obstruction concerning for malignancy. Had EGD by gastroenterology on 05/03/2019 revealed a esophagitis, nonbleeding erosive gastropathy, gastric ulcer with a clean ulcer base, multiple gastric polyps, acquired duodenal stenosis and mucosal change in the duodenum.  Biopsy suggested scarring from chronic inflammatory changes but negative for malignancy or dysplasia.  Underwent laparoscopic gastrojejunostomy and placement of feeding gastrojejunostomy tube by Dr. Johney Maine on 05/06/2019.  Assessment & Plan    Principal problem Intractable nausea and vomiting, found to have gastric outlet obstruction, acquired duodenal stenosis -GI was consulted, patient underwent EGD on 8/1, pathology suggested chronic inflammatory change, negative for malignancy or dysplasia.  No evidence of malignancy -General surgery was consulted, patient underwent laparoscopic gastrojejunostomy with feeding GJ tube placement on 8/4 ( Dr. Johney Maine) -NG tube discontinued on 8/5 -GI recommended repeating EGD in 4 to 6-week with Dr. Silverio Decamp to reevaluate the duodenal stricture. -J-tube unclogged on 8/11, currently tolerating tube feeds at goal, G tube clamped  -Advance diet to full liquids, if tolerated, hopefully DC home in a.m.  Mild leukocytosis Resolved  Moderate protein calorie malnutrition Albumin 1.9.  Start pro-stat  Acute kidney injury -Likely from GI losses from nausea and vomiting -Presented with creatinine of 2.0, baseline around 1.4 -Creatinine improved, 0.8  Mild for hypophosphatemia -Improving, continue monitoring  Hypokalemia -Resolved  Essential hypertension Improving, hold home losartan, diuretics  GERD -Continue PPI  Hypothyroidism Continue IV Synthroid.  Per surgery, no crushed meds per J-tube  Primary hyperparathyroidism Stable, on Reclast outpatient  OSA CPAP if she tolerates  Obesity BMI 36.9, continue diet and weight control   Code Status: full  DVT Prophylaxis   heparin  Family Communication: Discussed in detail with the patient, all imaging results, lab results explained to the patient    Disposition Plan: Hopefully DC home tomorrow if tolerated full liquids  Time Spent in minutes 35 minutes  Procedures:  EGD 8/1 8/4 laparoscopic gastrojejunostomy, feeding gastro-jejunostomy tube  Consultants:   Gastroenterology General surgery  Antimicrobials:   Anti-infectives (From admission, onward)   Start     Dose/Rate Route Frequency Ordered Stop   05/11/19 1800  erythromycin ethylsuccinate (EES) 200 MG/5ML suspension 400 mg     400 mg Per J Tube Every 8 hours 05/11/19 1808     05/08/19 2200  erythromycin ethylsuccinate (EES) 200 MG/5ML suspension 400 mg  Status:  Discontinued     400 mg Per J Tube Every 8 hours 05/08/19 1531 05/11/19 1808   05/06/19 2300  cefoTEtan (CEFOTAN) 2 g in sodium chloride 0.9 % 100 mL IVPB     2 g 200 mL/hr over 30 Minutes Intravenous Every 12 hours 05/06/19 1540 05/06/19 2255   05/06/19 2200  erythromycin ethylsuccinate (EES) 200 MG/5ML suspension 400 mg  Status:  Discontinued     400 mg Oral Every 8 hours 05/06/19 1606 05/08/19 1531   05/06/19 1600   erythromycin (EES) 400 MG/5ML suspension 400 mg  Status:  Discontinued     400 mg Per Tube 3 times daily 05/06/19 1540 05/06/19 1605   05/06/19 0815  cefoTEtan (CEFOTAN) 2 g in sodium chloride 0.9 % 100 mL IVPB     2 g 200 mL/hr over 30 Minutes Intravenous On call to O.R. 05/06/19 0808 05/06/19 1105         Medications  Scheduled Meds: . chlorhexidine  15 mL Mouth Rinse BID  . erythromycin ethylsuccinate  400 mg Per J Tube Q8H  . feeding supplement (OSMOLITE 1.5 CAL)  1,000 mL Per Tube Q24H  . [START ON 05/16/2019] levothyroxine  50 mcg Oral Q0600  . lip balm  1 application Topical BID  . mouth rinse  15 mL Mouth Rinse q12n4p  . pantoprazole  40 mg Oral BID  . sertraline  50 mg Oral Daily   Continuous Infusions: . sodium chloride Stopped (05/05/19 1505)  . dextrose 5 % and 0.9% NaCl 50 mL/hr at 05/15/19 0226  . methocarbamol (ROBAXIN) IV 1,000 mg (05/14/19 2307)   PRN Meds:.sodium chloride, acetaminophen, bisacodyl, HYDROcodone-acetaminophen, magic mouthwash, menthol-cetylpyridinium, methocarbamol (ROBAXIN) IV, morphine injection, ondansetron **OR** ondansetron (ZOFRAN) IV, phenol, sodium chloride flush, sodium chloride flush      Subjective:   Damesha Lawler was seen and examined today.  No complaints per patient.  Tolerating p.o. fluids.  Abdominal pain better, 3/10.  No vomiting no fevers.  Patient denies dizziness, chest pain, shortness of breath.. No acute events overnight.    Objective:   Vitals:   05/14/19 1331 05/14/19 1959 05/15/19 0452 05/15/19 1418  BP: 113/77 (!) 121/51 (!) 126/59 (!) 119/53  Pulse: 72 70 76 78  Resp: 17 19 19 16   Temp: 98.5 F (36.9 C) 98.2 F (36.8 C) 98 F (36.7 C) 98 F (36.7 C)  TempSrc: Oral Oral Oral   SpO2:  98% 96% 100%  Weight:   111.9 kg   Height:        Intake/Output Summary (Last 24 hours) at 05/15/2019 1542 Last data filed at 05/15/2019 1100 Gross per 24 hour  Intake 1699.06 ml  Output 1250 ml  Net 449.06 ml     Wt  Readings from Last 3 Encounters:  05/15/19 111.9 kg  04/28/19 99.3 kg  04/22/19 99.3 kg   Physical Exam  General: Alert and oriented x 3, NAD  Eyes:   HEENT:  Atraumatic, normocephalic  Cardiovascular: S1 S2 clear, RRR. No pedal edema b/l  Respiratory: CTAB, no wheezing, rales or rhonchi  Gastrointestinal: Soft, GJ tube in place , nondistended, NBS  Ext: no pedal edema bilaterally  Neuro: no new deficits  Musculoskeletal: No cyanosis, clubbing  Skin: No rashes  Psych: Normal affect and demeanor, alert and oriented x3     Data Reviewed:  I have personally reviewed following labs and imaging studies  Micro Results No results found for this or any previous visit (from the past 240 hour(s)).  Radiology  Reports Ct Abdomen Pelvis Wo Contrast  Result Date: 05/01/2019 CLINICAL DATA:  Abdominal pain, question diverticulitis patient had endoscopy and said she had a blockage entire duodenum EXAM: CT ABDOMEN AND PELVIS WITHOUT CONTRAST TECHNIQUE: Multidetector CT imaging of the abdomen and pelvis was performed following the standard protocol without IV contrast. COMPARISON:  January 28, 2019 FINDINGS: Lower chest: Mitral valve calcifications are seen. The visualized heart is normal in size. No hiatal hernia. The visualized portions of the lungs are clear. Hepatobiliary: Although limited due to the lack of intravenous contrast, normal in appearance without gross focal abnormality. The patient is status post cholecystectomy. No biliary ductal dilation. Pancreas:  Unremarkable.  No surrounding inflammatory changes. Spleen: Normal in size. Although limited due to the lack of intravenous contrast, normal in appearance. Adrenals/Urinary Tract: Both adrenal glands appear normal. Again noted are multiple low-density lesions seen within both kidneys the largest seen in the lower pole of the right kidney measuring 3.6 cm. There is a slightly hyperdense lesion seen within the posterior midpole of the  left kidney measuring 1 cm as on the prior exam and could represent proteinaceous/hemorrhagic cyst. The remainder are likely simple renal cysts. Again noted are multiple bilateral non-obstructing renal calculi. No perinephric stranding is seen. No ureteral or bladder calculi are noted. Stomach/Bowel: There is a dilated fluid and air-filled stomach to the level of the gastroduodenal junction where there is an gradual tapering. There is air and fluid seen within the third portion of the duodenum. The remainder of the small bowel is grossly normal in appearance. There is sigmoid colonic diverticula with question of minimal stranding seen adjacent to the sigmoid colon, series 2, image 66 which could represent mild sigmoid colonic diverticulitis. No pericolonic collection or free air is seen. Vascular/Lymphatic: There are no enlarged abdominal or pelvic lymph nodes. Scattered aortic atherosclerotic calcifications are seen without aneurysmal dilatation. Reproductive: The uterus and adnexa are unremarkable. Other: No evidence of abdominal wall mass or hernia. Musculoskeletal: No acute or significant osseous findings. The patient is status post decompression and fixation from L2 through L4. IMPRESSION: 1. Dilated fluid and air-filled stomach to the level of the gastric duodenal junction, consistent with the patient's history and recent endoscopy. There is limited visualization of the second portion of the duodenum to the lack of oral contrast. If there is question of gastric motility issues would recommend gastric emptying study. 2. Mild sigmoid colonic diverticulitis. No pericolonic abscess or free air Electronically Signed   By: Prudencio Pair M.D.   On: 05/01/2019 17:00   [place] Ekg Site Rite  Result Date: 05/05/2019 If Site Rite image not attached, placement could not be confirmed due to current cardiac rhythm.   Lab Data:  CBC: Recent Labs  Lab 05/09/19 0343 05/11/19 0550 05/12/19 0620 05/13/19 0500 05/14/19  0500  WBC 8.7  --   --  6.9 7.8  HGB 10.1* 10.7* 10.2* 10.3* 10.1*  HCT 33.5* 35.1* 33.0* 32.9* 32.6*  MCV 96.8  --   --  96.2 96.7  PLT 249  --   --  348 277   Basic Metabolic Panel: Recent Labs  Lab 05/09/19 0343 05/10/19 0426 05/11/19 0550 05/12/19 0620 05/13/19 0500 05/14/19 0500  NA 142 141  --  138 138 140  K 4.0 3.8  --  3.8 3.5 3.8  CL 114* 113*  --  109 109 111  CO2 25 24  --  25 25 23   GLUCOSE 136* 117*  --  96 107* 113*  BUN 14 13  --  12 11 9   CREATININE 1.01* 0.89  --  0.91 0.96 0.87  CALCIUM 9.0 9.0  --  9.0 8.8* 8.6*  MG 1.8 1.8 1.9 1.7 1.9 1.9  PHOS 1.9* 1.9* 1.9* 2.3* 2.1* 2.1*   GFR: Estimated Creatinine Clearance: 61 mL/min (by C-G formula based on SCr of 0.87 mg/dL). Liver Function Tests: Recent Labs  Lab 05/10/19 0426 05/14/19 0500  AST 19 20  ALT 18 19  ALKPHOS 98 124  BILITOT 0.4 0.3  PROT 4.4* 4.5*  ALBUMIN 1.9* 1.9*   No results for input(s): LIPASE, AMYLASE in the last 168 hours. No results for input(s): AMMONIA in the last 168 hours. Coagulation Profile: No results for input(s): INR, PROTIME in the last 168 hours. Cardiac Enzymes: No results for input(s): CKTOTAL, CKMB, CKMBINDEX, TROPONINI in the last 168 hours. BNP (last 3 results) No results for input(s): PROBNP in the last 8760 hours. HbA1C: No results for input(s): HGBA1C in the last 72 hours. CBG: Recent Labs  Lab 05/14/19 1148 05/14/19 1646 05/15/19 0013 05/15/19 0613 05/15/19 1202  GLUCAP 109* 86 107* 108* 103*   Lipid Profile: No results for input(s): CHOL, HDL, LDLCALC, TRIG, CHOLHDL, LDLDIRECT in the last 72 hours. Thyroid Function Tests: No results for input(s): TSH, T4TOTAL, FREET4, T3FREE, THYROIDAB in the last 72 hours. Anemia Panel: No results for input(s): VITAMINB12, FOLATE, FERRITIN, TIBC, IRON, RETICCTPCT in the last 72 hours. Urine analysis:    Component Value Date/Time   COLORURINE YELLOW 05/02/2019 0832   APPEARANCEUR CLEAR 05/02/2019 0832    LABSPEC 1.017 05/02/2019 0832   PHURINE 5.0 05/02/2019 0832   GLUCOSEU NEGATIVE 05/02/2019 0832   HGBUR SMALL (A) 05/02/2019 0832   BILIRUBINUR NEGATIVE 05/02/2019 0832   KETONESUR NEGATIVE 05/02/2019 0832   PROTEINUR NEGATIVE 05/02/2019 0832   UROBILINOGEN 0.2 06/01/2010 0915   NITRITE NEGATIVE 05/02/2019 0832   LEUKOCYTESUR SMALL (A) 05/02/2019 0832     Kason Benak M.D. [facility] 05/15/2019, 3:42 PM  Pager: [phone] Between 7am to 7pm - call Pager - [phone]  After 7pm go to [email or web address] - password [email or web address]  Call night coverage person covering after 7pm

## 2019-05-15 NOTE — Progress Notes (Signed)
9 Days Post-Op  Subjective: CC: Abdominal Pain Patient reports that she did well with CLD yesterday. Tolerated without nausea, emesis, increased abdominal pain or distension. Passing flatus. Had a soft BM yesterday.   Objective: Vital signs in last 24 hours: Temp:  [98 F (36.7 C)-98.5 F (36.9 C)] 98 F (36.7 C) (08/13 0452) Pulse Rate:  [70-76] 76 (08/13 0452) Resp:  [17-19] 19 (08/13 0452) BP: (113-126)/(51-77) 126/59 (08/13 0452) SpO2:  [96 %-98 %] 96 % (08/13 0452) Weight:  [111.9 kg] 111.9 kg (08/13 0452) Last BM Date: 05/14/19  Intake/Output from previous day: 08/12 0701 - 08/13 0700 In: 1658.9 [P.O.:480; I.V.:291.4; NG/GT:807.5; IV Piggyback:50] Out: 850 [Urine:850] Intake/Output this shift: Total I/O In: 223.4 [I.V.:111.7; NG/GT:111.7] Out: 300 [Urine:300]  PE: Gen: Alert, NAD, pleasant, cooperative Pulm:Rate andeffort normal Abd: Soft,obese,ND,+BS,incisionswith minimal glue intact appear well and are without signs of infection,GJ tube in place and site with some drainage surrounding on dressing. Mild TTPof left hemiabdomenwithout guarding, no peritonitis, G tube clamped. J tube to TF's. 35ml/hr on monitor.  Skin: no rashes noted, warm and dry Msk: 1+ LE edema   Lab Results:  Recent Labs    05/13/19 0500 05/14/19 0500  WBC 6.9 7.8  HGB 10.3* 10.1*  HCT 32.9* 32.6*  PLT 348 356   BMET Recent Labs    05/13/19 0500 05/14/19 0500  NA 138 140  K 3.5 3.8  CL 109 111  CO2 25 23  GLUCOSE 107* 113*  BUN 11 9  CREATININE 0.96 0.87  CALCIUM 8.8* 8.6*   PT/INR No results for input(s): LABPROT, INR in the last 72 hours. CMP     Component Value Date/Time   NA 140 05/14/2019 0500   K 3.8 05/14/2019 0500   CL 111 05/14/2019 0500   CO2 23 05/14/2019 0500   GLUCOSE 113 (H) 05/14/2019 0500   BUN 9 05/14/2019 0500   CREATININE 0.87 05/14/2019 0500   CREATININE 1.09 (H) 08/04/2015 1346   CALCIUM 8.6 (L) 05/14/2019 0500   PROT 4.5 (L)  05/14/2019 0500   PROT 6.5 03/17/2014 1348   ALBUMIN 1.9 (L) 05/14/2019 0500   AST 20 05/14/2019 0500   ALT 19 05/14/2019 0500   ALKPHOS 124 05/14/2019 0500   BILITOT 0.3 05/14/2019 0500   GFRNONAA >60 05/14/2019 0500   GFRAA >60 05/14/2019 0500   Lipase     Component Value Date/Time   LIPASE 30 05/01/2019 1524       Studies/Results: No results found.  Anti-infectives: Anti-infectives (From admission, onward)   Start     Dose/Rate Route Frequency Ordered Stop   05/11/19 1800  erythromycin ethylsuccinate (EES) 200 MG/5ML suspension 400 mg     400 mg Per J Tube Every 8 hours 05/11/19 1808     05/08/19 2200  erythromycin ethylsuccinate (EES) 200 MG/5ML suspension 400 mg  Status:  Discontinued     400 mg Per J Tube Every 8 hours 05/08/19 1531 05/11/19 1808   05/06/19 2300  cefoTEtan (CEFOTAN) 2 g in sodium chloride 0.9 % 100 mL IVPB     2 g 200 mL/hr over 30 Minutes Intravenous Every 12 hours 05/06/19 1540 05/06/19 2255   05/06/19 2200  erythromycin ethylsuccinate (EES) 200 MG/5ML suspension 400 mg  Status:  Discontinued     400 mg Oral Every 8 hours 05/06/19 1606 05/08/19 1531   05/06/19 1600  erythromycin (EES) 400 MG/5ML suspension 400 mg  Status:  Discontinued     400 mg Per Tube  3 times daily 05/06/19 1540 05/06/19 1605   05/06/19 0815  cefoTEtan (CEFOTAN) 2 g in sodium chloride 0.9 % 100 mL IVPB     2 g 200 mL/hr over 30 Minutes Intravenous On call to O.R. 05/06/19 0808 05/06/19 1105       Assessment/Plan HTN T2DM - diet controlled AKI - Cr0.87 8/12 Hypothyroidism Hyperparathyroidism GERD OSA Obesity - BMI 36.94 LE edema   Gastric Outlet Obstruction from narrowing at D1/D2 - s/p EGD 8/1 Dr. Rush Landmark - malignant appearing stricture at [place] - biopsieswere neg for dysplasia or malignancy -prealbumin<5 - S/P laparoscopic gastrojejunostomy, feeding gastrojejunostomy tube, Dr. Johney Maine, 08/04- POD #9 - J tube for TF's,currently running at goal of  48ml/hr - NO CRUSHED MEDS PER J TUBE, SOLUTIONS ONLY - G tube clamped. Trial of CLD - Hycet per J tube for pain. Hold off on oral medications -Suppository - Patient needs to mobilize   FEN: FLD, IVF per medicine  VTE: SCDs, SQ heparin ON:[id number] to assist with GI motility, afeb, no leukocytosis Foley:none Follow up:Dr. Gross  Plan: J tube feeds at goal. G tube clamped. Trial of FLD today. If tolerates, can go home tomorrow from our standpoint.    LOS: 14 days    Jillyn Ledger , [facility] 05/15/2019, 8:54 AM Pager: [phone]

## 2019-05-15 NOTE — TOC Progression Note (Addendum)
Transition of Care [facility]) - Progression Note    Patient Details  Name: [name] MRN: [id number] Date of Birth: August 22, 1935  Transition of Care [facility]) CM/SW Contact  Leeroy Cha, RN Phone Number: 05/15/2019, 11:29 AM  Clinical Narrative:    [facility]-melissa [facility]/ t[facility] will accept patient.  Expected Discharge Plan: [place]    Expected Discharge Plan and Services Expected Discharge Plan: [place]   Discharge Planning Services: CM Consult Post Acute Care Choice: Durable Medical Equipment, Home Health Living arrangements for the past 2 months: Single Family Home                 DME Arranged: Hospital bed DME Agency: [employer] Date DME Agency Contacted: 05/13/19 Time DME Agency Contacted: [phone] Representative spoke with at DME Agency: zack HH Arranged: RN [place] Agency: [place] ([place]) Date [place]: 05/15/19 Time [place]: 1129 Representative spoke with at [place]: [place] ([place]) Interventions    Readmission Risk Interventions No flowsheet data found.

## 2019-05-16 LAB — GLUCOSE, CAPILLARY
Glucose-Capillary: 100 mg/dL — ABNORMAL HIGH (ref 70–99)
Glucose-Capillary: 126 mg/dL — ABNORMAL HIGH (ref 70–99)
Glucose-Capillary: 129 mg/dL — ABNORMAL HIGH (ref 70–99)
Glucose-Capillary: 130 mg/dL — ABNORMAL HIGH (ref 70–99)
Glucose-Capillary: 99 mg/dL (ref 70–99)

## 2019-05-16 LAB — BASIC METABOLIC PANEL
Anion gap: 5 (ref 5–15)
BUN: 12 mg/dL (ref 8–23)
CO2: 25 mmol/L (ref 22–32)
Calcium: 8.9 mg/dL (ref 8.9–10.3)
Chloride: 108 mmol/L (ref 98–111)
Creatinine, Ser: 0.94 mg/dL (ref 0.44–1.00)
GFR calc Af Amer: >60 mL/min (ref >60)
GFR calc non Af Amer: 57 mL/min — ABNORMAL LOW (ref >60)
Glucose, Bld: 116 mg/dL — ABNORMAL HIGH (ref 70–99)
Potassium: 4.4 mmol/L (ref 3.5–5.1)
Sodium: 138 mmol/L (ref 135–145)

## 2019-05-16 LAB — CBC
HCT: 33.4 % — ABNORMAL LOW (ref 36.0–46.0)
Hemoglobin: 10.5 g/dL — ABNORMAL LOW (ref 12.0–15.0)
MCH: 30.5 pg (ref 26.0–34.0)
MCHC: 31.4 g/dL (ref 30.0–36.0)
MCV: 97.1 fL (ref 80.0–100.0)
Platelets: 393 10*3/uL (ref 150–400)
RBC: 3.44 MIL/uL — ABNORMAL LOW (ref 3.87–5.11)
RDW: 15.6 % — ABNORMAL HIGH (ref 11.5–15.5)
WBC: 7.1 10*3/uL (ref 4.0–10.5)
nRBC: 0 % (ref 0.0–0.2)

## 2019-05-16 LAB — PREALBUMIN: Prealbumin: 9.7 mg/dL — ABNORMAL LOW (ref 18–38)

## 2019-05-16 MED ORDER — FUROSEMIDE 10 MG/ML IJ SOLN
40.0000 mg | Freq: Every day | INTRAMUSCULAR | Status: AC
  Administered 2019-05-16 – 2019-05-17 (×2): 40 mg via INTRAVENOUS
  Filled 2019-05-16 (×2): qty 4

## 2019-05-16 MED ORDER — ACETAMINOPHEN 160 MG/5ML PO SOLN
650.0000 mg | Freq: Four times a day (QID) | ORAL | Status: DC | PRN
  Administered 2019-05-18 (×2): 650 mg
  Filled 2019-05-16 (×2): qty 20.3

## 2019-05-16 MED ORDER — SIMETHICONE 80 MG PO CHEW
80.0000 mg | CHEWABLE_TABLET | ORAL | Status: DC | PRN
  Administered 2019-05-16 – 2019-05-18 (×5): 80 mg via ORAL
  Filled 2019-05-16 (×5): qty 1

## 2019-05-16 MED ORDER — PRO-STAT SUGAR FREE PO LIQD
30.0000 mL | Freq: Two times a day (BID) | ORAL | Status: DC
  Administered 2019-05-16 – 2019-05-19 (×6): 30 mL via ORAL
  Filled 2019-05-16 (×6): qty 30

## 2019-05-16 MED ORDER — METHOCARBAMOL 500 MG PO TABS
750.0000 mg | ORAL_TABLET | Freq: Four times a day (QID) | ORAL | Status: DC | PRN
  Administered 2019-05-18: 750 mg via ORAL
  Filled 2019-05-16: qty 2

## 2019-05-16 NOTE — TOC Progression Note (Signed)
Transition of Care [facility]) - Progression Note    Patient Details  Name: MEHAR SAGEN MRN: [id number] Date of Birth: 1935-06-13  Transition of Care [facility]) CM/SW Contact  Leeroy Cha, RN Phone Number: 05/16/2019, 8:31 AM  Clinical Narrative:    TCF-Husband, states that patient will not do anything for herself unless she is made to.worried about about her not getting her home meds and eye drops-note put in system to the md about this.   Expected Discharge Plan: [place]    Expected Discharge Plan and Services Expected Discharge Plan: [place]   Discharge Planning Services: CM Consult Post Acute Care Choice: Durable Medical Equipment, Home Health Living arrangements for the past 2 months: Single Family Home                 DME Arranged: Hospital bed DME Agency: [employer] Date DME Agency Contacted: 05/13/19 Time DME Agency Contacted: [phone] Representative spoke with at DME Agency: zack HH Arranged: RN [place] Agency: [place] ([place]) Date [place]: 05/15/19 Time [place]: 1129 Representative spoke with at [place]: [place] ([place]) Interventions    Readmission Risk Interventions No flowsheet data found.

## 2019-05-16 NOTE — Progress Notes (Signed)
[facility]                                                                              Patient Demographics  [name], is a 84 y.o. female, DOB - [birth year], YSA:[id number]  Admit date - 05/01/2019   Admitting Physician Elwyn Reach, MD  Outpatient Primary MD for the patient is Burnard Bunting, MD  Outpatient specialists:   LOS - 15  days   Medical records reviewed and are as summarized below:    Chief Complaint  Patient presents with  . Emesis  . Diarrhea       Brief summary   84 year old female with history of DM-2, GERD, HTN, morbid obesity, hyperlipidemia, diverticulosis, hyperparathyroidism, anxiety disorder and bronchiectasis; admitted on7/30/2020, presented with complaint ofnausea and vomiting, was found to havegastric outlet obstruction concerning for malignancy. Had EGD by gastroenterology on 05/03/2019 revealed a esophagitis, nonbleeding erosive gastropathy, gastric ulcer with a clean ulcer base, multiple gastric polyps, acquired duodenal stenosis and mucosal change in the duodenum.  Biopsy suggested scarring from chronic inflammatory changes but negative for malignancy or dysplasia.  Underwent laparoscopic gastrojejunostomy and placement of feeding gastrojejunostomy tube by Dr. Johney Maine on 05/06/2019.  Assessment & Plan    Principal problem Intractable nausea and vomiting, found to have gastric outlet obstruction, acquired duodenal stenosis -GI was consulted, patient underwent EGD on 8/1, pathology suggested chronic inflammatory change, negative for malignancy or dysplasia.  No evidence of malignancy -General surgery was consulted, patient underwent laparoscopic gastrojejunostomy with feeding GJ tube placement on 8/4 ( Dr. Johney Maine) -NG tube discontinued on 8/5 -GI recommended repeating EGD in 4 to 6-week with Dr. Silverio Decamp to reevaluate the duodenal stricture. -J-tube unclogged on 8/11, currently tolerating tube feeds at goal, G tube clamped  -Management per general surgery, diet advanced to soft solids.  Per surgery if she is able to tolerate soft diet, may be able to wean TPN at night only  Mild leukocytosis Resolved  Moderate protein calorie malnutrition, hypoalbuminemia Albumin 1.9.,  Prealbumin low 9.7 Start pro-stat twice daily  Lower extremity edema -Likely due to third spacing and volume overload -I's and O's with 15 L positive -will give Lasix 40 mg IV x2, reassess in a.m.  Acute kidney injury -Likely from GI losses from nausea and vomiting -Presented with creatinine of 2.0, baseline around 1.4 -Creatinine improved, 0.8  Mild for hypophosphatemia -Improving, continue monitoring  Hypokalemia -Resolved  Essential hypertension BP soft, continue to hold losartan.  Placed on IV Lasix  GERD -Continue PPI  Hypothyroidism Continue IV Synthroid.  Per surgery, no crushed meds per J-tube  Primary hyperparathyroidism Stable, on Reclast outpatient  OSA CPAP if she tolerates  Obesity BMI 36.9, continue diet and weight control   Code Status: full  DVT Prophylaxis   heparin  Family Communication: Discussed in detail with the patient, all imaging results, lab results explained to the patient    Disposition Plan: Hopefully DC home in a.m. if tolerating solid diet and cleared by general surgery   Time Spent in minutes 25 minutes  Procedures:  EGD 8/1 8/4 laparoscopic gastrojejunostomy, feeding gastro-jejunostomy tube  Consultants:   Gastroenterology General  surgery  Antimicrobials:   Anti-infectives (From admission, onward)   Start     Dose/Rate Route Frequency Ordered Stop   05/11/19 1800  erythromycin ethylsuccinate (EES) 200 MG/5ML suspension 400 mg     400 mg Per J Tube Every 8 hours 05/11/19 1808     05/08/19 2200  erythromycin ethylsuccinate (EES) 200 MG/5ML suspension 400 mg  Status:  Discontinued     400 mg Per J Tube Every 8 hours 05/08/19 1531 05/11/19 1808   05/06/19 2300   cefoTEtan (CEFOTAN) 2 g in sodium chloride 0.9 % 100 mL IVPB     2 g 200 mL/hr over 30 Minutes Intravenous Every 12 hours 05/06/19 1540 05/06/19 2255   05/06/19 2200  erythromycin ethylsuccinate (EES) 200 MG/5ML suspension 400 mg  Status:  Discontinued     400 mg Oral Every 8 hours 05/06/19 1606 05/08/19 1531   05/06/19 1600  erythromycin (EES) 400 MG/5ML suspension 400 mg  Status:  Discontinued     400 mg Per Tube 3 times daily 05/06/19 1540 05/06/19 1605   05/06/19 0815  cefoTEtan (CEFOTAN) 2 g in sodium chloride 0.9 % 100 mL IVPB     2 g 200 mL/hr over 30 Minutes Intravenous On call to O.R. 05/06/19 0808 05/06/19 1105         Medications  Scheduled Meds: . chlorhexidine  15 mL Mouth Rinse BID  . erythromycin ethylsuccinate  400 mg Per J Tube Q8H  . feeding supplement (OSMOLITE 1.5 CAL)  1,000 mL Per Tube Q24H  . furosemide  40 mg Intravenous Daily  . levothyroxine  50 mcg Oral Q0600  . lip balm  1 application Topical BID  . mouth rinse  15 mL Mouth Rinse q12n4p  . pantoprazole  40 mg Oral BID  . sertraline  50 mg Oral Daily   Continuous Infusions: . sodium chloride Stopped (05/05/19 1505)   PRN Meds:.sodium chloride, acetaminophen (TYLENOL) oral liquid 160 mg/5 mL, acetaminophen, bisacodyl, HYDROcodone-acetaminophen, magic mouthwash, menthol-cetylpyridinium, methocarbamol, morphine injection, ondansetron **OR** ondansetron (ZOFRAN) IV, phenol      Subjective:   [name] was seen and examined today.  Patient very concerned about her lower extremity edema otherwise tolerating full liquid diet.  Abdominal pain improving.  No vomiting no fevers.  Patient denies dizziness, chest pain, shortness of breath.. No acute events overnight.    Objective:   Vitals:   05/15/19 1418 05/15/19 2033 05/16/19 0506 05/16/19 1411  BP: (!) 119/53 (!) 111/57 (!) 116/54 103/60  Pulse: 78 78 68 83  Resp: 16 18 18 15   Temp: 98 F (36.7 C) 99 F (37.2 C) 98.4 F (36.9 C)   TempSrc:   Oral Oral   SpO2: 100% 97% 99% 97%  Weight:   114.3 kg   Height:        Intake/Output Summary (Last 24 hours) at 05/16/2019 1659 Last data filed at 05/16/2019 1518 Gross per 24 hour  Intake 1997.57 ml  Output 1200 ml  Net 797.57 ml     Wt Readings from Last 3 Encounters:  05/16/19 114.3 kg  04/28/19 99.3 kg  04/22/19 99.3 kg   Physical Exam  General: Alert and oriented x 3, NAD  Eyes:   HEENT:  Atraumatic, normocephalic  Cardiovascular: S1 S2 clear, no murmurs, RRR.  2+ pedal edema b/l  Respiratory: CTAB, no wheezing, rales or rhonchi  Gastrointestinal: Soft, GJ tube, nondistended, NBS  Ext: 2+ pitting edema.  Neuro: no new deficits  Musculoskeletal: No cyanosis, clubbing  Skin: No rashes  Psych: Normal affect and demeanor, alert and oriented x3       Data Reviewed:  I have personally reviewed following labs and imaging studies  Micro Results No results found for this or any previous visit (from the past 240 hour(s)).  Radiology Reports Ct Abdomen Pelvis Wo Contrast  Result Date: 05/01/2019 CLINICAL DATA:  Abdominal pain, question diverticulitis patient had endoscopy and said she had a blockage entire duodenum EXAM: CT ABDOMEN AND PELVIS WITHOUT CONTRAST TECHNIQUE: Multidetector CT imaging of the abdomen and pelvis was performed following the standard protocol without IV contrast. COMPARISON:  January 28, 2019 FINDINGS: Lower chest: Mitral valve calcifications are seen. The visualized heart is normal in size. No hiatal hernia. The visualized portions of the lungs are clear. Hepatobiliary: Although limited due to the lack of intravenous contrast, normal in appearance without gross focal abnormality. The patient is status post cholecystectomy. No biliary ductal dilation. Pancreas:  Unremarkable.  No surrounding inflammatory changes. Spleen: Normal in size. Although limited due to the lack of intravenous contrast, normal in appearance. Adrenals/Urinary Tract: Both  adrenal glands appear normal. Again noted are multiple low-density lesions seen within both kidneys the largest seen in the lower pole of the right kidney measuring 3.6 cm. There is a slightly hyperdense lesion seen within the posterior midpole of the left kidney measuring 1 cm as on the prior exam and could represent proteinaceous/hemorrhagic cyst. The remainder are likely simple renal cysts. Again noted are multiple bilateral non-obstructing renal calculi. No perinephric stranding is seen. No ureteral or bladder calculi are noted. Stomach/Bowel: There is a dilated fluid and air-filled stomach to the level of the gastroduodenal junction where there is an gradual tapering. There is air and fluid seen within the third portion of the duodenum. The remainder of the small bowel is grossly normal in appearance. There is sigmoid colonic diverticula with question of minimal stranding seen adjacent to the sigmoid colon, series 2, image 66 which could represent mild sigmoid colonic diverticulitis. No pericolonic collection or free air is seen. Vascular/Lymphatic: There are no enlarged abdominal or pelvic lymph nodes. Scattered aortic atherosclerotic calcifications are seen without aneurysmal dilatation. Reproductive: The uterus and adnexa are unremarkable. Other: No evidence of abdominal wall mass or hernia. Musculoskeletal: No acute or significant osseous findings. The patient is status post decompression and fixation from L2 through L4. IMPRESSION: 1. Dilated fluid and air-filled stomach to the level of the gastric duodenal junction, consistent with the patient's history and recent endoscopy. There is limited visualization of the second portion of the duodenum to the lack of oral contrast. If there is question of gastric motility issues would recommend gastric emptying study. 2. Mild sigmoid colonic diverticulitis. No pericolonic abscess or free air Electronically Signed   By: Prudencio Pair M.D.   On: 05/01/2019 17:00    [place] Ekg Site Rite  Result Date: 05/05/2019 If Site Rite image not attached, placement could not be confirmed due to current cardiac rhythm.   Lab Data:  CBC: Recent Labs  Lab 05/11/19 0550 05/12/19 0620 05/13/19 0500 05/14/19 0500 05/16/19 0748  WBC  --   --  6.9 7.8 7.1  HGB 10.7* 10.2* 10.3* 10.1* 10.5*  HCT 35.1* 33.0* 32.9* 32.6* 33.4*  MCV  --   --  96.2 96.7 97.1  PLT  --   --  348 356 761   Basic Metabolic Panel: Recent Labs  Lab 05/10/19 0426 05/11/19 0550 05/12/19 0620 05/13/19 0500 05/14/19 0500 05/16/19 6073  NA 141  --  138 138 140 138  K 3.8  --  3.8 3.5 3.8 4.4  CL 113*  --  109 109 111 108  CO2 24  --  25 25 23 25   GLUCOSE 117*  --  96 107* 113* 116*  BUN 13  --  12 11 9 12   CREATININE 0.89  --  0.91 0.96 0.87 0.94  CALCIUM 9.0  --  9.0 8.8* 8.6* 8.9  MG 1.8 1.9 1.7 1.9 1.9  --   PHOS 1.9* 1.9* 2.3* 2.1* 2.1*  --    GFR: Estimated Creatinine Clearance: 57.2 mL/min (by C-G formula based on SCr of 0.94 mg/dL). Liver Function Tests: Recent Labs  Lab 05/10/19 0426 05/14/19 0500  AST 19 20  ALT 18 19  ALKPHOS 98 124  BILITOT 0.4 0.3  PROT 4.4* 4.5*  ALBUMIN 1.9* 1.9*   No results for input(s): LIPASE, AMYLASE in the last 168 hours. No results for input(s): AMMONIA in the last 168 hours. Coagulation Profile: No results for input(s): INR, PROTIME in the last 168 hours. Cardiac Enzymes: No results for input(s): CKTOTAL, CKMB, CKMBINDEX, TROPONINI in the last 168 hours. BNP (last 3 results) No results for input(s): PROBNP in the last 8760 hours. HbA1C: No results for input(s): HGBA1C in the last 72 hours. CBG: Recent Labs  Lab 05/15/19 1202 05/15/19 1658 05/16/19 0011 05/16/19 0630 05/16/19 1135  GLUCAP 103* 94 126* 99 100*   Lipid Profile: No results for input(s): CHOL, HDL, LDLCALC, TRIG, CHOLHDL, LDLDIRECT in the last 72 hours. Thyroid Function Tests: No results for input(s): TSH, T4TOTAL, FREET4, T3FREE, THYROIDAB in the last 72  hours. Anemia Panel: No results for input(s): VITAMINB12, FOLATE, FERRITIN, TIBC, IRON, RETICCTPCT in the last 72 hours. Urine analysis:    Component Value Date/Time   COLORURINE YELLOW 05/02/2019 0832   APPEARANCEUR CLEAR 05/02/2019 0832   LABSPEC 1.017 05/02/2019 0832   PHURINE 5.0 05/02/2019 0832   GLUCOSEU NEGATIVE 05/02/2019 0832   HGBUR SMALL (A) 05/02/2019 0832   BILIRUBINUR NEGATIVE 05/02/2019 0832   KETONESUR NEGATIVE 05/02/2019 0832   PROTEINUR NEGATIVE 05/02/2019 0832   UROBILINOGEN 0.2 06/01/2010 0915   NITRITE NEGATIVE 05/02/2019 0832   LEUKOCYTESUR SMALL (A) 05/02/2019 0832     Ripudeep Rai M.D. [facility] 05/16/2019, 4:59 PM  Pager: [phone] Between 7am to 7pm - call Pager - [phone]  After 7pm go to [email or web address] - password [email or web address]  Call night coverage person covering after 7pm

## 2019-05-16 NOTE — Progress Notes (Signed)
rm [place], Had Lap GastroJejunostomy on 8/1. She has Bi LE non pitting edema +3.  Has not taken home diruretic since 05/01/19.

## 2019-05-16 NOTE — Progress Notes (Signed)
OT Cancellation Note  Patient Details Name: [name] MRN: [id number] DOB: 14-Nov-1934   Cancelled Treatment:    Reason Eval/Treat Not Completed: Fatigue/lethargy limiting ability to participate. Pt reports that she just got back in bed from recliner from PT session this morning and requests OT return later. Will try back as appropriate/available  Britt Bottom 05/16/2019, 12:30 PM

## 2019-05-16 NOTE — Progress Notes (Signed)
[name], 83yoF, Had Lap Grastrojejunostomy on 8/1., has +3 Bil LE Edema. Has not taken home hydrochlorothiazide since 05/01/19. Thx.

## 2019-05-16 NOTE — Progress Notes (Signed)
10 Days Post-Op  Subjective: CC: Edema Patient reports that she is doing well from an abdominal standpoint. Tolerated FLD yesterday without any increasing abdominal pain, N/V, burping, belching or regurgitation. Did have some burning sensation around her LUQ when lying down but felt this was relieved when she sat up in the chair. She notes she continues to pass flatus and have BM's. She had another soft BM yesterday. HH has contacted her husband and is arranging her equipment etc. She did devleop some LE edema last night as well as some mild sob. No CP or cough.   Objective: Vital signs in last 24 hours: Temp:  [98 F (36.7 C)-99 F (37.2 C)] 98.4 F (36.9 C) (08/14 0506) Pulse Rate:  [68-78] 68 (08/14 0506) Resp:  [16-18] 18 (08/14 0506) BP: (111-119)/(53-57) 116/54 (08/14 0506) SpO2:  [97 %-100 %] 99 % (08/14 0506) Weight:  [114.3 kg] 114.3 kg (08/14 0506) Last BM Date: 05/14/19  Intake/Output from previous day: 08/13 0701 - 08/14 0700 In: 1338.5 [P.O.:120; I.V.:609.3; NG/GT:609.2] Out: 1150 [Urine:1150] Intake/Output this shift: No intake/output data recorded.  PE: Gen: Alert, NAD, pleasant, cooperative Pulm:Rate andeffort normal Abd: Soft,obese,ND,+BS,incisionswith minimal glue intact appear well and are without signs of infection,GJ tube in place with clean dressing. Mild TTPof left hemiabdomenwithout guarding, no peritonitis, G tubeclamped. J tube to TF's. 59ml/hr on monitor. Skin: no rashes noted, warm and dry Msk: 2+ LE edema  Lab Results:  Recent Labs    05/14/19 0500 05/16/19 0748  WBC 7.8 7.1  HGB 10.1* 10.5*  HCT 32.6* 33.4*  PLT 356 393   BMET Recent Labs    05/14/19 0500 05/16/19 0748  NA 140 138  K 3.8 4.4  CL 111 108  CO2 23 25  GLUCOSE 113* 116*  BUN 9 12  CREATININE 0.87 0.94  CALCIUM 8.6* 8.9   PT/INR No results for input(s): LABPROT, INR in the last 72 hours. CMP     Component Value Date/Time   NA 138 05/16/2019  0748   K 4.4 05/16/2019 0748   CL 108 05/16/2019 0748   CO2 25 05/16/2019 0748   GLUCOSE 116 (H) 05/16/2019 0748   BUN 12 05/16/2019 0748   CREATININE 0.94 05/16/2019 0748   CREATININE 1.09 (H) 08/04/2015 1346   CALCIUM 8.9 05/16/2019 0748   PROT 4.5 (L) 05/14/2019 0500   PROT 6.5 03/17/2014 1348   ALBUMIN 1.9 (L) 05/14/2019 0500   AST 20 05/14/2019 0500   ALT 19 05/14/2019 0500   ALKPHOS 124 05/14/2019 0500   BILITOT 0.3 05/14/2019 0500   GFRNONAA 57 (L) 05/16/2019 0748   GFRAA >60 05/16/2019 0748   Lipase     Component Value Date/Time   LIPASE 30 05/01/2019 1524       Studies/Results: No results found.  Anti-infectives: Anti-infectives (From admission, onward)   Start     Dose/Rate Route Frequency Ordered Stop   05/11/19 1800  erythromycin ethylsuccinate (EES) 200 MG/5ML suspension 400 mg     400 mg Per J Tube Every 8 hours 05/11/19 1808     05/08/19 2200  erythromycin ethylsuccinate (EES) 200 MG/5ML suspension 400 mg  Status:  Discontinued     400 mg Per J Tube Every 8 hours 05/08/19 1531 05/11/19 1808   05/06/19 2300  cefoTEtan (CEFOTAN) 2 g in sodium chloride 0.9 % 100 mL IVPB     2 g 200 mL/hr over 30 Minutes Intravenous Every 12 hours 05/06/19 1540 05/06/19 2255   05/06/19 2200  erythromycin ethylsuccinate (EES) 200 MG/5ML suspension 400 mg  Status:  Discontinued     400 mg Oral Every 8 hours 05/06/19 1606 05/08/19 1531   05/06/19 1600  erythromycin (EES) 400 MG/5ML suspension 400 mg  Status:  Discontinued     400 mg Per Tube 3 times daily 05/06/19 1540 05/06/19 1605   05/06/19 0815  cefoTEtan (CEFOTAN) 2 g in sodium chloride 0.9 % 100 mL IVPB     2 g 200 mL/hr over 30 Minutes Intravenous On call to O.R. 05/06/19 0808 05/06/19 1105       Assessment/Plan HTN T2DM - diet controlled AKI - Cr0.94 dism Hyperparathyroidism GERD OSA Obesity - BMI 36.94 LE edema- per medicine   Gastric Outlet Obstruction from narrowing at D1/D2 - s/p EGD 8/1 Dr.  Rush Landmark - malignant appearing stricture at D1/D2 - biopsieswere neg for dysplasia or malignancy -prealbumin<5. Recheck  - S/P laparoscopic gastrojejunostomy, feeding gastrojejunostomy tube, Dr. Johney Maine, 08/04- POD#10 - J tube for TF's,currently running at goal of 29ml/hr and at goal - NO CRUSHED MEDS PER J TUBE, SOLUTIONS ONLY - G tubeclamped. Advance to soft diet  -Hycet per J tube for pain - Continue to mobilize with PT  GHW:EXHB, IVFper medicine VTE: SCDs, SQ heparin ZJ:[id number] to assist with GI motility, afeb, no leukocytosis Foley:none Follow up:Dr. Johney Maine POC - Luanne Bras [phone] - I updated him on the phone this morning   Plan: She is doing well with FLD. I think it is worth keeping her in the hospital to see how she does with a soft diet. If she tolerates a soft diet may be able to wean TF's to at night only.    LOS: 15 days    Jillyn Ledger , [facility] Surgery 05/16/2019, 8:37 AM Pager: [phone]

## 2019-05-16 NOTE — Progress Notes (Signed)
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 Johney Maine, 08/04.  PMH includes:  sleep apnea, osteoporosis, HTN, glaucoma, depression, DM    PT Comments    Pt ambulated in hallway and encouraged to ambulate again later today with nursing staff.  Pt reports d/c plan is still home with hospital bed so she can stay on main level.  Pt feels she will perform well at home and has no concerns.  Possible d/c home tomorrow.   Follow Up Recommendations  Home health PT;Supervision/Assistance - 24 hour     Equipment Recommendations  Hospital bed    Recommendations for Other Services       Precautions / Restrictions Precautions Precautions: Fall Precaution Comments: G tube, J tube Restrictions Weight Bearing Restrictions: No    Mobility  Bed Mobility Overal bed mobility: Needs Assistance Bed Mobility: Sit to Sidelying         Sit to sidelying: Min assist General bed mobility comments: assist for LEs onto bed  Transfers Overall transfer level: Needs assistance Equipment used: Rolling walker (2 wheeled) Transfers: Sit to/from Stand Sit to Stand: Min guard         General transfer comment: cues for hand placement, effortful rise however no physical assist required, min/guard for safety  Ambulation/Gait Ambulation/Gait assistance: Min guard Gait Distance (Feet): 100 Feet Assistive device: Rolling walker (2 wheeled) Gait Pattern/deviations: Step-through pattern;Decreased stride length Gait velocity: decreased   General Gait Details: pt required one standing rest break approx 2 min, shaky UEs however appears improved since last session, slow but steady with RW   Stairs             Wheelchair Mobility    Modified Rankin (Stroke Patients Only)       Balance                                            Cognition Arousal/Alertness: Awake/alert Behavior During Therapy: WFL for tasks assessed/performed Overall Cognitive Status: Within Functional Limits for tasks assessed                                        Exercises      General Comments        Pertinent Vitals/Pain Pain Assessment: 0-10 Pain Score: 3  Pain Location: abdomen (increases with coughing) (tolerable with ambulation per pt) Pain Descriptors / Indicators: Guarding;Grimacing;Tender Pain Intervention(s): Monitored during session;Repositioned    Home Living                      Prior Function            PT Goals (current goals can now be found in the care plan section) Progress towards PT goals: Progressing toward goals    Frequency    Min 3X/week      PT Plan Current plan remains appropriate    Co-evaluation              AM-PAC PT "6 Clicks" Mobility   Outcome Measure  Help needed turning from your back to your  side while in a flat bed without using bedrails?: A Little Help needed moving from lying on your back to sitting on the side of a flat bed without using bedrails?: A Little Help needed moving to and from a bed to a chair (including a wheelchair)?: A Little Help needed standing up from a chair using your arms (e.g., wheelchair or bedside chair)?: A Little Help needed to walk in hospital room?: A Little Help needed climbing 3-5 steps with a railing? : A Lot 6 Click Score: 17    End of Session   Activity Tolerance: Patient limited by fatigue Patient left: with call bell/phone within reach;in bed   PT Visit Diagnosis: Difficulty in walking, not elsewhere classified (R26.2)     Time: 1594-5859 PT Time Calculation (min) (ACUTE ONLY): 22 min  Charges:  $Gait Training: 8-22 mins                     Carmelia Bake, PT, DPT Acute  Rehabilitation Services Office: [phone] Pager: [phone]  Trena Platt 05/16/2019, 12:47 PM

## 2019-05-17 LAB — ALBUMIN: Albumin: 2.2 g/dL — ABNORMAL LOW (ref 3.5–5.0)

## 2019-05-17 LAB — GLUCOSE, CAPILLARY
Glucose-Capillary: 104 mg/dL — ABNORMAL HIGH (ref 70–99)
Glucose-Capillary: 129 mg/dL — ABNORMAL HIGH (ref 70–99)
Glucose-Capillary: 111 mg/dL — ABNORMAL HIGH (ref 70–99)

## 2019-05-17 LAB — BASIC METABOLIC PANEL
Anion gap: 7 (ref 5–15)
BUN: 13 mg/dL (ref 8–23)
CO2: 27 mmol/L (ref 22–32)
Calcium: 9.2 mg/dL (ref 8.9–10.3)
Chloride: 105 mmol/L (ref 98–111)
Creatinine, Ser: 0.94 mg/dL (ref 0.44–1.00)
GFR calc Af Amer: >60 mL/min (ref >60)
GFR calc non Af Amer: 57 mL/min — ABNORMAL LOW (ref >60)
Glucose, Bld: 108 mg/dL — ABNORMAL HIGH (ref 70–99)
Potassium: 4.4 mmol/L (ref 3.5–5.1)
Sodium: 139 mmol/L (ref 135–145)

## 2019-05-17 MED ORDER — OSMOLITE 1.5 CAL PO LIQD
1000.0000 mL | ORAL | Status: DC
  Administered 2019-05-17 – 2019-05-18 (×2): 1000 mL
  Filled 2019-05-17 (×3): qty 1000

## 2019-05-17 MED ORDER — ALBUMIN HUMAN 25 % IV SOLN
25.0000 g | Freq: Three times a day (TID) | INTRAVENOUS | Status: AC
  Administered 2019-05-17: 12.5 g via INTRAVENOUS
  Administered 2019-05-17: 25 g via INTRAVENOUS
  Administered 2019-05-18: 12.5 g via INTRAVENOUS
  Filled 2019-05-17 (×4): qty 100

## 2019-05-17 MED ORDER — FUROSEMIDE 10 MG/ML IJ SOLN
40.0000 mg | Freq: Three times a day (TID) | INTRAMUSCULAR | Status: AC
  Administered 2019-05-17 – 2019-05-18 (×3): 40 mg via INTRAVENOUS
  Filled 2019-05-17 (×3): qty 4

## 2019-05-17 NOTE — Progress Notes (Addendum)
11 Days Post-Op  Subjective: CC:   Feeling better.  Tolerating soft diet.  Having flatus and BM.  Hoping to go home soon.  Objective: Vital signs in last 24 hours: Temp:  [98 F (36.7 C)-98.7 F (37.1 C)] 98.7 F (37.1 C) (08/15 0557) Pulse Rate:  [83-88] 88 (08/15 0557) Resp:  [15-18] 18 (08/15 0557) BP: (103-144)/(58-66) 130/66 (08/15 0557) SpO2:  [97 %-98 %] 97 % (08/15 0557) Last BM Date: 05/16/19  Intake/Output from previous day: 08/14 0701 - 08/15 0700 In: 3000.9 [P.O.:760; I.V.:448.4; NG/GT:1792.5] Out: 2100 [Urine:2050; Blood:50] Intake/Output this shift: No intake/output data recorded.  PE: Gen: Alert, NAD, pleasant, cooperative Pulm:Rate andeffort normal  Abd: Soft,obese,NDGJ tube in place with clean dressing. Mild TTPof left hemiabdomenwithout guarding, no peritonitis, gastric port of GJ tube clamped.  Jejunal port of GJ tube to TF's. 28ml/hr on monitor.  Skin: no rashes noted, warm and dry Msk: 1+ LE edema  Lab Results:  Recent Labs    05/16/19 0748  WBC 7.1  HGB 10.5*  HCT 33.4*  PLT 393   BMET Recent Labs    05/16/19 0748 05/17/19 0338  NA 138 139  K 4.4 4.4  CL 108 105  CO2 25 27  GLUCOSE 116* 108*  BUN 12 13  CREATININE 0.94 0.94  CALCIUM 8.9 9.2   PT/INR No results for input(s): LABPROT, INR in the last 72 hours. CMP     Component Value Date/Time   NA 139 05/17/2019 0338   K 4.4 05/17/2019 0338   CL 105 05/17/2019 0338   CO2 27 05/17/2019 0338   GLUCOSE 108 (H) 05/17/2019 0338   BUN 13 05/17/2019 0338   CREATININE 0.94 05/17/2019 0338   CREATININE 1.09 (H) 08/04/2015 1346   CALCIUM 9.2 05/17/2019 0338   PROT 4.5 (L) 05/14/2019 0500   PROT 6.5 03/17/2014 1348   ALBUMIN 2.2 (L) 05/17/2019 0338   AST 20 05/14/2019 0500   ALT 19 05/14/2019 0500   ALKPHOS 124 05/14/2019 0500   BILITOT 0.3 05/14/2019 0500   GFRNONAA 57 (L) 05/17/2019 0338   GFRAA >60 05/17/2019 0338   Lipase     Component Value  Date/Time   LIPASE 30 05/01/2019 1524       Studies/Results: No results found.  Anti-infectives: Anti-infectives (From admission, onward)   Start     Dose/Rate Route Frequency Ordered Stop   05/11/19 1800  erythromycin ethylsuccinate (EES) 200 MG/5ML suspension 400 mg     400 mg Per J Tube Every 8 hours 05/11/19 1808     05/08/19 2200  erythromycin ethylsuccinate (EES) 200 MG/5ML suspension 400 mg  Status:  Discontinued     400 mg Per J Tube Every 8 hours 05/08/19 1531 05/11/19 1808   05/06/19 2300  cefoTEtan (CEFOTAN) 2 g in sodium chloride 0.9 % 100 mL IVPB     2 g 200 mL/hr over 30 Minutes Intravenous Every 12 hours 05/06/19 1540 05/06/19 2255   05/06/19 2200  erythromycin ethylsuccinate (EES) 200 MG/5ML suspension 400 mg  Status:  Discontinued     400 mg Oral Every 8 hours 05/06/19 1606 05/08/19 1531   05/06/19 1600  erythromycin (EES) 400 MG/5ML suspension 400 mg  Status:  Discontinued     400 mg Per Tube 3 times daily 05/06/19 1540 05/06/19 1605   05/06/19 0815  cefoTEtan (CEFOTAN) 2 g in sodium chloride 0.9 % 100 mL IVPB     2 g 200 mL/hr over 30 Minutes Intravenous On call  to O.R. 05/06/19 0947 05/06/19 1105       Assessment/Plan HTN T2DM - diet controlled AKI - Cr0.94 dism Hyperparathyroidism GERD OSA Obesity - BMI 36.94 LE edema- per medicine     Gastric Outlet Obstruction from narrowing at D1/D2 - s/p EGD 8/1 Dr. Rush Landmark - malignant appearing stricture at D1/D2 - biopsieswere neg for dysplasia or malignancy  - S/P laparoscopic gastrojejunostomy, feeding gastrojejunostomy tube, Dr. Johney Maine, 08/04- POD#10  POST-OPERATIVE DIAGNOSIS:  Gastric outlet obstruction   PROCEDURE:   LAPAROSCOPIC GASTROJEJUNOSTOMY  LAPAROSCOPIC FEEDING GASTROJEJUNOSTOMY TUBE  SURGEON:  Adin Hector, MD    Improved overall.  Okay to discharge home with nightly jejunal tube feeds from general surgery standpoint    FEN: Soft diet as tolerated.    Erythromycin to assist with GI motility x 1 more week Advance to a regular heart healthy soon. Stable off IV parenteral nutrition.   NO CRUSHED MEDS PER J TUBE, SOLUTIONS ONLY.  Able to get most medicine by mouth now prealbumin<5 consistent with severe malnutrition. Recheck 9.7 Agree with tube feeds at night through jejunostomy tube - 50 -75% goal..  Hopefully can wean off TFs within the next month or so as oral intake improves and malnutrition is corrected.   Continue GJ tube.  Most likely can remove once more than 6 weeks out from placement = late September    VTE: SCDs, SQ heparin ID:, afeb, no leukocytosis Foley:none Follow up:Dr. Johney Maine in 3-4 weeks  POC - Luanne Bras [phone]      LOS: 16 days    Adin Hector, MD, FACS, MASCRS Gastrointestinal and Minimally Invasive Surgery    [address], [place], [place] [address] [phone] Main / Paging [phone] Fax

## 2019-05-17 NOTE — Progress Notes (Signed)
[facility]                                                                              Patient Demographics  [name], is a 84 y.o. female, DOB - [birth year], HDQ:[id number]  Admit date - 05/01/2019   Admitting Physician Elwyn Reach, MD  Outpatient Primary MD for the patient is Burnard Bunting, MD  Outpatient specialists:   LOS - 16  days   Medical records reviewed and are as summarized below:    Chief Complaint  Patient presents with  . Emesis  . Diarrhea       Brief summary   84 year old female with history of DM-2, GERD, HTN, morbid obesity, hyperlipidemia, diverticulosis, hyperparathyroidism, anxiety disorder and bronchiectasis; admitted on7/30/2020, presented with complaint ofnausea and vomiting, was found to havegastric outlet obstruction concerning for malignancy. Had EGD by gastroenterology on 05/03/2019 revealed a esophagitis, nonbleeding erosive gastropathy, gastric ulcer with a clean ulcer base, multiple gastric polyps, acquired duodenal stenosis and mucosal change in the duodenum.  Biopsy suggested scarring from chronic inflammatory changes but negative for malignancy or dysplasia.  Underwent laparoscopic gastrojejunostomy and placement of feeding gastrojejunostomy tube by Dr. Johney Maine on 05/06/2019.  Assessment & Plan    Principal problem Intractable nausea and vomiting, found to have gastric outlet obstruction, acquired duodenal stenosis -GI was consulted, patient underwent EGD on 8/1, pathology suggested chronic inflammatory change, negative for malignancy or dysplasia.  No evidence of malignancy -General surgery was consulted, patient underwent laparoscopic gastrojejunostomy with feeding GJ tube placement on 8/4 ( Dr. Johney Maine) -NG tube discontinued on 8/5 -GI recommended repeating EGD in 4 to 6-week with Dr. Silverio Decamp to reevaluate the duodenal stricture. -J-tube unclogged on 8/11, currently tolerating tube feeds at goal, G tube clamped  -So far tolerating soft solids, per surgery may be able to wean tube feeds only at night -Dietitian consulted for qhs tube feed regimen   Mild leukocytosis Resolved  Moderate protein calorie malnutrition, hypoalbuminemia, lower extremity edema Albumin 1.9.,  Prealbumin low 9.7 Started pro-stat twice daily Will give albumin IV x3 doses  Lower extremity edema -Likely due to third spacing and volume overload -I's and O's, improving 15L--> 12L + today -Lasix 40 mg every 8 hours x3 with albumin, will reassess in a.m.  Acute kidney injury -Likely from GI losses from nausea and vomiting -Presented with creatinine of 2.0, baseline around 1.4 -Creatinine improving to 0.9  Mild for hypophosphatemia -Improving, continue monitoring  Hypokalemia -Resolved  Essential hypertension BP stable, continue IV Lasix every 8 hours, follow  GERD -Continue PPI  Hypothyroidism Continue IV Synthroid.  Per surgery, no crushed meds per J-tube  Primary hyperparathyroidism Stable, on Reclast outpatient  OSA CPAP if she tolerates  Obesity BMI 36.9, continue diet and weight control   Code Status: full  DVT Prophylaxis   heparin  Family Communication: Discussed in detail with the patient, all imaging results, lab results explained to the patient    Disposition Plan: DC home in next 24 to 48 hours pending clinical improvement, night MD feeding regimen, lower extremity edema   Time Spent in minutes 25 minutes  Procedures:  EGD 8/1  8/4 laparoscopic gastrojejunostomy, feeding gastro-jejunostomy tube  Consultants:   Gastroenterology General surgery  Antimicrobials:   Anti-infectives (From admission, onward)   Start     Dose/Rate Route Frequency Ordered Stop   05/11/19 1800  erythromycin ethylsuccinate (EES) 200 MG/5ML suspension 400 mg     400 mg Per J Tube Every 8 hours 05/11/19 1808 05/24/19 0909   05/08/19 2200  erythromycin ethylsuccinate (EES) 200 MG/5ML suspension 400 mg   Status:  Discontinued     400 mg Per J Tube Every 8 hours 05/08/19 1531 05/11/19 1808   05/06/19 2300  cefoTEtan (CEFOTAN) 2 g in sodium chloride 0.9 % 100 mL IVPB     2 g 200 mL/hr over 30 Minutes Intravenous Every 12 hours 05/06/19 1540 05/06/19 2255   05/06/19 2200  erythromycin ethylsuccinate (EES) 200 MG/5ML suspension 400 mg  Status:  Discontinued     400 mg Oral Every 8 hours 05/06/19 1606 05/08/19 1531   05/06/19 1600  erythromycin (EES) 400 MG/5ML suspension 400 mg  Status:  Discontinued     400 mg Per Tube 3 times daily 05/06/19 1540 05/06/19 1605   05/06/19 0815  cefoTEtan (CEFOTAN) 2 g in sodium chloride 0.9 % 100 mL IVPB     2 g 200 mL/hr over 30 Minutes Intravenous On call to O.R. 05/06/19 0808 05/06/19 1105         Medications  Scheduled Meds: . chlorhexidine  15 mL Mouth Rinse BID  . erythromycin ethylsuccinate  400 mg Per J Tube Q8H  . feeding supplement (OSMOLITE 1.5 CAL)  1,000 mL Per Tube Q24H  . feeding supplement (PRO-STAT SUGAR FREE 64)  30 mL Oral BID  . furosemide  40 mg Intravenous Q8H  . levothyroxine  50 mcg Oral Q0600  . lip balm  1 application Topical BID  . mouth rinse  15 mL Mouth Rinse q12n4p  . pantoprazole  40 mg Oral BID  . sertraline  50 mg Oral Daily   Continuous Infusions: . sodium chloride Stopped (05/05/19 1505)  . albumin human 25 g (05/17/19 1220)   PRN Meds:.sodium chloride, acetaminophen (TYLENOL) oral liquid 160 mg/5 mL, acetaminophen, bisacodyl, HYDROcodone-acetaminophen, magic mouthwash, menthol-cetylpyridinium, methocarbamol, morphine injection, ondansetron **OR** ondansetron (ZOFRAN) IV, phenol, simethicone      Subjective:   Sharlet Notaro was seen and examined today.  Frustrated about her lower legs being swollen, no chest pain no shortness of breath.   No abdominal pain, vomiting or fevers.    Patient denies dizziness, chest pain, shortness of breath.. No acute events overnight.    Objective:   Vitals:   05/16/19 1411  05/16/19 2053 05/17/19 0557 05/17/19 1431  BP: 103/60 (!) 144/58 130/66 (!) 121/58  Pulse: 83 86 88 84  Resp: 15 15 18  (!) 23  Temp:  98 F (36.7 C) 98.7 F (37.1 C) 98.4 F (36.9 C)  TempSrc:  Oral Oral   SpO2: 97% 98% 97% 95%  Weight:      Height:        Intake/Output Summary (Last 24 hours) at 05/17/2019 1510 Last data filed at 05/17/2019 1400 Gross per 24 hour  Intake 1401.66 ml  Output 2850 ml  Net -1448.34 ml     Wt Readings from Last 3 Encounters:  05/16/19 114.3 kg  04/28/19 99.3 kg  04/22/19 99.3 kg   Physical Exam  General: Alert and oriented x 3, NAD  Eyes:   HEENT:    Cardiovascular: S1 S2 clear,  RRR.  2+ pedal  edema b/l  Respiratory: CTAB, no wheezing, rales or rhonchi  Gastrointestinal: Soft, midline dressing intact, Gj-tube  Ext: 2+ pedal edema bilaterally  Neuro: no new deficits  Musculoskeletal: No cyanosis, clubbing  Skin: No rashes  Psych: Normal affect and demeanor, alert and oriented x3    Data Reviewed:  I have personally reviewed following labs and imaging studies  Micro Results No results found for this or any previous visit (from the past 240 hour(s)).  Radiology Reports Ct Abdomen Pelvis Wo Contrast  Result Date: 05/01/2019 CLINICAL DATA:  Abdominal pain, question diverticulitis patient had endoscopy and said she had a blockage entire duodenum EXAM: CT ABDOMEN AND PELVIS WITHOUT CONTRAST TECHNIQUE: Multidetector CT imaging of the abdomen and pelvis was performed following the standard protocol without IV contrast. COMPARISON:  January 28, 2019 FINDINGS: Lower chest: Mitral valve calcifications are seen. The visualized heart is normal in size. No hiatal hernia. The visualized portions of the lungs are clear. Hepatobiliary: Although limited due to the lack of intravenous contrast, normal in appearance without gross focal abnormality. The patient is status post cholecystectomy. No biliary ductal dilation. Pancreas:  Unremarkable.  No  surrounding inflammatory changes. Spleen: Normal in size. Although limited due to the lack of intravenous contrast, normal in appearance. Adrenals/Urinary Tract: Both adrenal glands appear normal. Again noted are multiple low-density lesions seen within both kidneys the largest seen in the lower pole of the right kidney measuring 3.6 cm. There is a slightly hyperdense lesion seen within the posterior midpole of the left kidney measuring 1 cm as on the prior exam and could represent proteinaceous/hemorrhagic cyst. The remainder are likely simple renal cysts. Again noted are multiple bilateral non-obstructing renal calculi. No perinephric stranding is seen. No ureteral or bladder calculi are noted. Stomach/Bowel: There is a dilated fluid and air-filled stomach to the level of the gastroduodenal junction where there is an gradual tapering. There is air and fluid seen within the third portion of the duodenum. The remainder of the small bowel is grossly normal in appearance. There is sigmoid colonic diverticula with question of minimal stranding seen adjacent to the sigmoid colon, series 2, image 66 which could represent mild sigmoid colonic diverticulitis. No pericolonic collection or free air is seen. Vascular/Lymphatic: There are no enlarged abdominal or pelvic lymph nodes. Scattered aortic atherosclerotic calcifications are seen without aneurysmal dilatation. Reproductive: The uterus and adnexa are unremarkable. Other: No evidence of abdominal wall mass or hernia. Musculoskeletal: No acute or significant osseous findings. The patient is status post decompression and fixation from L2 through L4. IMPRESSION: 1. Dilated fluid and air-filled stomach to the level of the gastric duodenal junction, consistent with the patient's history and recent endoscopy. There is limited visualization of the second portion of the duodenum to the lack of oral contrast. If there is question of gastric motility issues would recommend  gastric emptying study. 2. Mild sigmoid colonic diverticulitis. No pericolonic abscess or free air Electronically Signed   By: Prudencio Pair M.D.   On: 05/01/2019 17:00   [place] Ekg Site Rite  Result Date: 05/05/2019 If Site Rite image not attached, placement could not be confirmed due to current cardiac rhythm.   Lab Data:  CBC: Recent Labs  Lab 05/11/19 0550 05/12/19 0620 05/13/19 0500 05/14/19 0500 05/16/19 0748  WBC  --   --  6.9 7.8 7.1  HGB 10.7* 10.2* 10.3* 10.1* 10.5*  HCT 35.1* 33.0* 32.9* 32.6* 33.4*  MCV  --   --  96.2 96.7 97.1  PLT  --   --  348 356 263   Basic Metabolic Panel: Recent Labs  Lab 05/11/19 0550 05/12/19 0620 05/13/19 0500 05/14/19 0500 05/16/19 0748 05/17/19 0338  NA  --  138 138 140 138 139  K  --  3.8 3.5 3.8 4.4 4.4  CL  --  109 109 111 108 105  CO2  --  25 25 23 25 27   GLUCOSE  --  96 107* 113* 116* 108*  BUN  --  12 11 9 12 13   CREATININE  --  0.91 0.96 0.87 0.94 0.94  CALCIUM  --  9.0 8.8* 8.6* 8.9 9.2  MG 1.9 1.7 1.9 1.9  --   --   PHOS 1.9* 2.3* 2.1* 2.1*  --   --    GFR: Estimated Creatinine Clearance: 57.2 mL/min (by C-G formula based on SCr of 0.94 mg/dL). Liver Function Tests: Recent Labs  Lab 05/14/19 0500 05/17/19 0338  AST 20  --   ALT 19  --   ALKPHOS 124  --   BILITOT 0.3  --   PROT 4.5*  --   ALBUMIN 1.9* 2.2*   No results for input(s): LIPASE, AMYLASE in the last 168 hours. No results for input(s): AMMONIA in the last 168 hours. Coagulation Profile: No results for input(s): INR, PROTIME in the last 168 hours. Cardiac Enzymes: No results for input(s): CKTOTAL, CKMB, CKMBINDEX, TROPONINI in the last 168 hours. BNP (last 3 results) No results for input(s): PROBNP in the last 8760 hours. HbA1C: No results for input(s): HGBA1C in the last 72 hours. CBG: Recent Labs  Lab 05/16/19 1135 05/16/19 1813 05/16/19 2255 05/17/19 0558 05/17/19 1137  GLUCAP 100* 129* 130* 104* 129*   Lipid Profile: No results for  input(s): CHOL, HDL, LDLCALC, TRIG, CHOLHDL, LDLDIRECT in the last 72 hours. Thyroid Function Tests: No results for input(s): TSH, T4TOTAL, FREET4, T3FREE, THYROIDAB in the last 72 hours. Anemia Panel: No results for input(s): VITAMINB12, FOLATE, FERRITIN, TIBC, IRON, RETICCTPCT in the last 72 hours. Urine analysis:    Component Value Date/Time   COLORURINE YELLOW 05/02/2019 0832   APPEARANCEUR CLEAR 05/02/2019 0832   LABSPEC 1.017 05/02/2019 0832   PHURINE 5.0 05/02/2019 0832   GLUCOSEU NEGATIVE 05/02/2019 0832   HGBUR SMALL (A) 05/02/2019 0832   BILIRUBINUR NEGATIVE 05/02/2019 0832   KETONESUR NEGATIVE 05/02/2019 0832   PROTEINUR NEGATIVE 05/02/2019 0832   UROBILINOGEN 0.2 06/01/2010 0915   NITRITE NEGATIVE 05/02/2019 0832   LEUKOCYTESUR SMALL (A) 05/02/2019 0832     Jaymin Waln M.D. [facility] 05/17/2019, 3:10 PM  Pager: [phone] Between 7am to 7pm - call Pager - [phone]  After 7pm go to [email or web address] - password [email or web address]  Call night coverage person covering after 7pm

## 2019-05-17 NOTE — Discharge Instructions (Signed)
Gastrostomy Tube Home Guide, Adult °A gastrostomy tube, or G-tube, is a tube that is inserted through the abdomen into the stomach. The tube is used to give feedings and medicines when a person is unable to eat and drink enough on his or her own. °How to care for a G-tube °Supplies needed °· Saline solution or clean, warm water and soap. °· Cotton swab or gauze. °· Precut gauze bandage (dressing) and tape, if needed. °Instructions °1. Wash your hands with soap and water. °2. If there is a dressing between the person's skin and the tube, remove it. °3. Check the area where the tube enters the skin. Check for problems such as: °? Redness. °? Swelling. °? Pus-like drainage. °? Extra skin growth. °4. Moisten the cotton swab with the saline solution or soap and water mixture. Gently clean around the insertion site. Remove any drainage or crusting. °? When the G-tube is first put in, a normal saline solution or water can be used to clean the skin. °? Mild soap and warm water can be used when the skin around the G-tube site has healed. °5. If there should be a dressing between the person's skin and the tube, apply it at this time. °How to flush a G-tube °Flush the G-tube regularly to keep it from clogging. Flush it before and after feedings and as often as told by the health care provider. °Supplies needed °· Purified or sterile water, warmed. If the person has a weak disease-fighting (immune) system, or if he or she has difficulty fighting off infections (is immunocompromised), use only sterile water. °? If you are unsure about the amount of chemical contaminants in purified or drinking water, use sterile water. °? To purify drinking water by boiling: °§ Boil water for at least 1 minute. Keep lid over water while it boils. Allow water to cool to room temperature before using. °· 60cc G-tube syringe. °Instructions °1. Wash your hands with soap and water. °2. Draw up 30 mL of warm water in a syringe. °3. Connect the syringe  to the tube. °4. Slowly and gently push the water into the tube. °G-tube problems and solutions °· If the tube comes out: °? Cover the opening with a clean dressing and tape. °? Call a health care provider right away. °? A health care provider will need to put the tube back in within 4 hours. °· If there is skin or scar tissue growing where the tube enters the skin: °? Keep the area clean and dry. °? Secure the tube with tape so that the tube does not move around too much. °? Call a health care provider. °· If the tube gets clogged: °? Slowly push warm water into the tube with a large syringe. °? Do not force the fluid into the tube or push an object into the tube. °? If you are not able to unclog the tube, call a health care provider right away. °Follow these instructions at home: °Feedings °· Give feedings at room temperature. °· Cover and place unused feedings in the refrigerator. °· If feedings are continuous: °? Do not put more than 4 hours worth of feedings in the feeding bag. °? Stop the feedings when you need to give medicine or flush the tube. Be sure to restart the feedings. °? Make sure the person's head is above his or her stomach (upright position). This will prevent choking and discomfort. °· Replace feeding bags and syringes as told by the health care provider. °· Make   sure the person is in the right position during and after feedings: ? During feedings, the person's position should be in the upright position. ? After a noncontinuous feeding (bolus feeding), have the person stay in the upright position for 1 hour. General instructions  Only use syringes made for G-tubes.  Do not pull or put tension on the tube.  Clamp the tube before removing the cap or disconnecting a syringe.  Measure the length of the G-tube every day from the insertion site to the end of the tube.  If the person's G-tube has a balloon, check the fluid in the balloon every week. The amount of fluid that should be in  the balloon can be found in the manufacturers specifications.  Make sure the person takes care of his or her oral health, such as by brushing his or her teeth.  Remove excess air from the G-tube as told by the person's health care provider. This is called "venting."  Keep the area where the tube enters the skin clean and dry.  Do not push feedings, medicines, or flushes rapidly. Contact a health care provider if:  The person with the tube has any of these problems: ? Constipation. ? Fever.  There is a large amount of fluid or mucus-like liquid leaking from the tube.  Skin or scar tissue appears to be growing where the tube enters the skin.  The length of tube from the insertion site to the G-tube gets longer. Get help right away if:  The person with the tube has any of these problems: ? Severe abdominal pain. ? Severe tenderness. ? Severe bloating. ? Nausea. ? Vomiting. ? Trouble breathing. ? Shortness of breath.  Any of these problems happen in the area where the tube enters the skin: ? Redness, irritation, swelling, or soreness. ? Pus-like discharge. ? A bad smell.  The tube is clogged and cannot be flushed.  The tube comes out. Summary  A gastrostomy tube, or G-tube, is a tube that is inserted through the abdomen into the stomach. The tube is used to give feedings and medicines when a person is unable to eat and drink enough on his or her own.  Check and clean the insertion site daily as told by the person's health care provider.  Flush the G-tube regularly to keep it from clogging. Flush it before and after feedings and as often as told by the person's health care provider.  Keep the area where the tube enters the skin clean and dry. This information is not intended to replace advice given to you by your health care provider. Make sure you discuss any questions you have with your health care provider. Document Released: 11/27/2001 Document Revised: 08/31/2017  Document Reviewed: 11/13/2016 Elsevier Patient Education  2020 [employer].   Peptic Ulcer  A peptic ulcer is a painful sore in the lining of your stomach or the first part of your small intestine. What are the causes? Common causes of this condition include:  An infection.  Using certain pain medicines too often or too much. What increases the risk? You are more likely to get this condition if you:  Smoke.  Have a family history of ulcer disease.  Drink alcohol.  Have been hospitalized in an intensive care unit (ICU). What are the signs or symptoms? Symptoms include:  Burning pain in the area between the chest and the belly button. The pain may: ? Not go away (be persistent). ? Be worse when your stomach is  empty. ? Be worse at night.  Heartburn.  Feeling sick to your stomach (nauseous) and throwing up (vomiting).  Bloating. If the ulcer results in bleeding, it can cause you to:  Have poop (stool) that is black and looks like tar.  Throw up bright red blood.  Throw up material that looks like coffee grounds. How is this treated? Treatment for this condition may include:  Stopping things that can cause the ulcer, such as: ? Smoking. ? Using pain medicines.  Medicines to reduce stomach acid.  Antibiotic medicines if the ulcer is caused by an infection.  A procedure that is done using a small, flexible tube that has a camera at the end (upper endoscopy). This may be done if you have a bleeding ulcer.  Surgery. This may be needed if: ? You have a lot of bleeding. ? The ulcer caused a hole somewhere in the digestive system. Follow these instructions at home:  Do not drink alcohol if your doctor tells you not to drink.  Limit how much caffeine you take in.  Do not use any products that contain nicotine or tobacco, such as cigarettes, e-cigarettes, and chewing tobacco. If you need help quitting, ask your doctor.  Take over-the-counter and prescription  medicines only as told by your doctor. ? Do not stop or change your medicines unless you talk with your doctor about it first. ? Do not take aspirin, ibuprofen, or other NSAIDs unless your doctor told you to do so.  Keep all follow-up visits as told by your doctor. This is important. Contact a doctor if:  You do not get better in 7 days after you start treatment.  You keep having an upset stomach (indigestion) or heartburn. Get help right away if:  You have sudden, sharp pain in your belly (abdomen).  You have belly pain that does not go away.  You have bloody poop (stool) or black, tarry poop.  You throw up blood. It may look like coffee grounds.  You feel light-headed or feel like you may pass out (faint).  You get weak.  You get sweaty or feel sticky and cold to the touch (clammy). Summary  Symptoms of a peptic ulcer include burning pain in the area between the chest and the belly button.  Take medicines only as told by your doctor.  Limit how much alcohol and caffeine you have.  Keep all follow-up visits as told by your doctor. This information is not intended to replace advice given to you by your health care provider. Make sure you discuss any questions you have with your health care provider. Document Released: 12/13/2009 Document Revised: 03/26/2018 Document Reviewed: 03/26/2018 Elsevier Patient Education  2020 [employer].

## 2019-05-18 LAB — BASIC METABOLIC PANEL
Anion gap: 4 — ABNORMAL LOW (ref 5–15)
BUN: 18 mg/dL (ref 8–23)
CO2: 37 mmol/L — ABNORMAL HIGH (ref 22–32)
Calcium: 9.9 mg/dL (ref 8.9–10.3)
Chloride: 98 mmol/L (ref 98–111)
Creatinine, Ser: 1.03 mg/dL — ABNORMAL HIGH (ref 0.44–1.00)
GFR calc Af Amer: 59 mL/min — ABNORMAL LOW (ref >60)
GFR calc non Af Amer: 51 mL/min — ABNORMAL LOW (ref >60)
Glucose, Bld: 123 mg/dL — ABNORMAL HIGH (ref 70–99)
Potassium: 4.1 mmol/L (ref 3.5–5.1)
Sodium: 139 mmol/L (ref 135–145)

## 2019-05-18 LAB — GLUCOSE, CAPILLARY
Glucose-Capillary: 122 mg/dL — ABNORMAL HIGH (ref 70–99)
Glucose-Capillary: 107 mg/dL — ABNORMAL HIGH (ref 70–99)
Glucose-Capillary: 83 mg/dL (ref 70–99)
Glucose-Capillary: 117 mg/dL — ABNORMAL HIGH (ref 70–99)

## 2019-05-18 NOTE — Progress Notes (Signed)
Nutrition Follow-up  DOCUMENTATION CODES:   Obesity unspecified  INTERVENTION:   Transition to night feeds that meet 50-75% of estimated needs: -Osmolite 1.5 @ 60 ml/hr x 12 hours via J-tube. -This will provide patient with 1080 kcal (65% of needs) and 45g protein (64% of needs) and 548 ml H2O. -Recommend free water flush with 120 ml before and after infusion to prevent clogging.  -Continue Prostat liquid protein PO 30 ml BID with meals, each supplement provides 100 kcal, 15 grams protein.  NUTRITION DIAGNOSIS:   Increased nutrient needs related to post-op healing as evidenced by estimated needs.  Ongoing.  GOAL:   Patient will meet greater than or equal to 90% of their needs  Progressing with PO intake and TF.  MONITOR:   Labs, Weight trends, TF tolerance, I & O's  ASSESSMENT:   84 year old female with history of DM-2, GERD, HTN, morbid obesity, hyperlipidemia, diverticulosis, hyperparathyroidism, anxiety disorder and bronchiectasis; admitted on 05/01/2019, presented with complaint of nausea and vomiting, was found to have gastric outlet obstruction concerning for malignancy.  7/30: admitted with GOO 8/1: EGD revealed small hiatal hernia, gastric ulcer, esophagitis, acquired duodenal stenosis, NGT was placed for suction 8/4: s/p bypass with laparoscopic gastrojejunostomy with feeding gastrojejunostomy tube 8/9: TF stopped d/t clogged J-tube 8/11 TF resumed  **RD working remotely**  RD consulted by MD to switch patient to night feed regimen. Per surgery note, pt has been initiated on a night feed regimen of Osmolite 1.5 @ 60 ml/hr x 12 hours. Surgery would like pt to meet 50-75% of needs via TF and the rest by PO intakes. Ultimate goal is to wean pt off TF in the coming weeks. Current regimen meets surgical goals of 50-75% of estimated needs. Will continue what has been ordered already.   Provided recommendations for free water flushes to prevent clogging of J-tube.    Patient consumed 25% of breakfast this morning and is taking Prostat supplements with no issue. On 8/15, pt consumed 25-45% of meals.   Pt's weight continues to increase since admission. Admission weight was 208 lbs. Now weighs 251 lbs as of 8/14. Per I/Os: +8L since 8/2.   Medications: IV Zofran PRN Labs reviewed: CBGs: 111-122 GFR: 51  Diet Order:   Diet Order            Diet Heart Room service appropriate? Yes; Fluid consistency: Thin  Diet effective now              EDUCATION NEEDS:   Not appropriate for education at this time  Skin:  Skin Assessment: Reviewed RN Assessment  Last BM:  8/16  Height:   Ht Readings from Last 1 Encounters:  05/01/19 5\' 3"  (1.6 m)    Weight:   Wt Readings from Last 1 Encounters:  05/16/19 114.3 kg    Ideal Body Weight:  52.3 kg  BMI:  Body mass index is 44.64 kg/m.  Estimated Nutritional Needs:   Kcal:  1650-1850  Protein:  70-80g  Fluid:  1.8L/day  Clayton Bibles, MS, RD, LDN [place] [facility] Pager: [phone] After Hours Pager: [phone]

## 2019-05-18 NOTE — TOC Progression Note (Signed)
Transition of Care [facility]) - Progression Note    Patient Details  Name: AHLAYA ENDE MRN: [id number] Date of Birth: Jun 21, 1935  [facility] CM/SW Contact  Joaquin Courts, RN Phone Number: 05/18/2019, 2:17 PM  Clinical Narrative:    [employer] rep notified of need for tube feeding pump for anticipated dc tomorrow. Confirmed [facility] will provide HH PT OT and RN services.    Expected Discharge Plan: [place]    Expected Discharge Plan and Services Expected Discharge Plan: [place]   Discharge Planning Services: CM Consult Post Acute Care Choice: Durable Medical Equipment, Home Health Living arrangements for the past 2 months: Single Family Home                 DME Arranged: Tube feeding pump DME Agency: [employer] Date DME Agency Contacted: 05/18/19 Time DME Agency Contacted: [phone] Representative spoke with at DME Agency: [place]: RN [place] Agency: [place] ([place]) Date [place]: 05/15/19 Time [place]: 1129 Representative spoke with at [place]: [place] ([place]) Interventions    Readmission Risk Interventions No flowsheet data found.

## 2019-05-18 NOTE — Progress Notes (Signed)
12 Days Post-Op  Subjective: CC:   Feeling well.  Tolerating soft diet.  Cycling tube feeds  Having flatus and BM.  Hoping to go home soon.  Objective: Vital signs in last 24 hours: Temp:  [98 F (36.7 C)-98.4 F (36.9 C)] 98 F (36.7 C) (08/16 0556) Pulse Rate:  [84-93] 93 (08/16 0556) Resp:  [15-23] 16 (08/16 0556) BP: (106-143)/(42-68) 106/42 (08/16 0556) SpO2:  [95 %-97 %] 97 % (08/16 0556) Last BM Date: 05/17/19  Intake/Output from previous day: 08/15 0701 - 08/16 0700 In: 1221.8 [P.O.:420; NG/GT:720; IV Piggyback:81.8] Out: 6065 [Urine:6065] Intake/Output this shift: No intake/output data recorded.  PE: Gen: Alert, NAD, pleasant, cooperative Pulm:Rate andeffort normal  Abd: Soft,obese,NDGJ tube in place with clean dressing. Minimal TTPat GJ site only.  No guarding, no peritonitis, gastric port of GJ tube clamped.  Jejunal port of GJ tube to TF's. 109ml/hr.  Skin: no rashes noted, warm and dry Msk: 1+ LE edema  Lab Results:  Recent Labs    05/16/19 0748  WBC 7.1  HGB 10.5*  HCT 33.4*  PLT 393   BMET Recent Labs    05/17/19 0338 05/18/19 0314  NA 139 139  K 4.4 4.1  CL 105 98  CO2 27 37*  GLUCOSE 108* 123*  BUN 13 18  CREATININE 0.94 1.03*  CALCIUM 9.2 9.9   PT/INR No results for input(s): LABPROT, INR in the last 72 hours. CMP     Component Value Date/Time   NA 139 05/18/2019 0314   K 4.1 05/18/2019 0314   CL 98 05/18/2019 0314   CO2 37 (H) 05/18/2019 0314   GLUCOSE 123 (H) 05/18/2019 0314   BUN 18 05/18/2019 0314   CREATININE 1.03 (H) 05/18/2019 0314   CREATININE 1.09 (H) 08/04/2015 1346   CALCIUM 9.9 05/18/2019 0314   PROT 4.5 (L) 05/14/2019 0500   PROT 6.5 03/17/2014 1348   ALBUMIN 2.2 (L) 05/17/2019 0338   AST 20 05/14/2019 0500   ALT 19 05/14/2019 0500   ALKPHOS 124 05/14/2019 0500   BILITOT 0.3 05/14/2019 0500   GFRNONAA 51 (L) 05/18/2019 0314   GFRAA 59 (L) 05/18/2019 0314   Lipase     Component  Value Date/Time   LIPASE 30 05/01/2019 1524       Studies/Results: No results found.  Anti-infectives: Anti-infectives (From admission, onward)   Start     Dose/Rate Route Frequency Ordered Stop   05/11/19 1800  erythromycin ethylsuccinate (EES) 200 MG/5ML suspension 400 mg     400 mg Per J Tube Every 8 hours 05/11/19 1808 05/24/19 0909   05/08/19 2200  erythromycin ethylsuccinate (EES) 200 MG/5ML suspension 400 mg  Status:  Discontinued     400 mg Per J Tube Every 8 hours 05/08/19 1531 05/11/19 1808   05/06/19 2300  cefoTEtan (CEFOTAN) 2 g in sodium chloride 0.9 % 100 mL IVPB     2 g 200 mL/hr over 30 Minutes Intravenous Every 12 hours 05/06/19 1540 05/06/19 2255   05/06/19 2200  erythromycin ethylsuccinate (EES) 200 MG/5ML suspension 400 mg  Status:  Discontinued     400 mg Oral Every 8 hours 05/06/19 1606 05/08/19 1531   05/06/19 1600  erythromycin (EES) 400 MG/5ML suspension 400 mg  Status:  Discontinued     400 mg Per Tube 3 times daily 05/06/19 1540 05/06/19 1605   05/06/19 0815  cefoTEtan (CEFOTAN) 2 g in sodium chloride 0.9 % 100 mL IVPB     2 g  200 mL/hr over 30 Minutes Intravenous On call to O.R. 05/06/19 9417 05/06/19 1105       Assessment/Plan HTN T2DM - diet controlled AKI - Cr0.94 dism Hyperparathyroidism GERD OSA Obesity - BMI 36.94 LE edema- per medicine     Gastric Outlet Obstruction from narrowing at D1/D2 - s/p EGD 8/1 Dr. Rush Landmark - malignant appearing stricture at D1/D2 - biopsieswere neg for dysplasia or malignancy  - S/P laparoscopic gastrojejunostomy, feeding gastrojejunostomy tube, Dr. Johney Maine, 08/04- POD#10  POST-OPERATIVE DIAGNOSIS:  Gastric outlet obstruction   PROCEDURE:   LAPAROSCOPIC GASTROJEJUNOSTOMY  LAPAROSCOPIC FEEDING GASTROJEJUNOSTOMY TUBE  SURGEON:  Adin Hector, MD    Improved overall.  Okay to discharge home with nightly jejunal tube feeds from general surgery standpoint    FEN: Solid diet as  tolerated.   Erythromycin to assist with GI motility x 1 more week Stable off IV parenteral nutrition.   NO CRUSHED MEDS PER J TUBE, SOLUTIONS ONLY.  Able to get most medicine by mouth now  prealbumin<5 consistent with severe malnutrition. Recheck 9.7  Agree with tube feeds at night through jejunostomy tube - 50 -75% goal.   Hopefully can wean off TFs within the next month or so at home as oral intake improves and malnutrition is corrected.   Continue GJ tube.  Most likely can remove once more than 6 weeks out from placement = late September  Stable from discharge from general surgery pinpoint.  We will follow more peripherally.  Hopefully discharging soon  VTE: SCDs, SQ heparin ID:, afeb, no leukocytosis Foley:none Follow up:Dr. Johney Maine in 3-4 weeks  POC - Luanne Bras [phone]      LOS: 89 days    Adin Hector, MD, FACS, MASCRS Gastrointestinal and Minimally Invasive Surgery    [address], [place], [place] [address] [phone] Main / Paging [phone] Fax

## 2019-05-18 NOTE — Progress Notes (Signed)
[facility]                                                                              Patient Demographics  [name], is a 84 y.o. female, DOB - 05/24/1935, LOV:[id number]  Admit date - 05/01/2019   Admitting Physician Elwyn Reach, MD  Outpatient Primary MD for the patient is Burnard Bunting, MD  Outpatient specialists:   LOS - 17  days   Medical records reviewed and are as summarized below:    Chief Complaint  Patient presents with  . Emesis  . Diarrhea       Brief summary   84 year old female with history of DM-2, GERD, HTN, morbid obesity, hyperlipidemia, diverticulosis, hyperparathyroidism, anxiety disorder and bronchiectasis; admitted on7/30/2020, presented with complaint ofnausea and vomiting, was found to havegastric outlet obstruction concerning for malignancy. Had EGD by gastroenterology on 05/03/2019 revealed a esophagitis, nonbleeding erosive gastropathy, gastric ulcer with a clean ulcer base, multiple gastric polyps, acquired duodenal stenosis and mucosal change in the duodenum.  Biopsy suggested scarring from chronic inflammatory changes but negative for malignancy or dysplasia.  Underwent laparoscopic gastrojejunostomy and placement of feeding gastrojejunostomy tube by Dr. Johney Maine on 05/06/2019.  Assessment & Plan    Principal problem Intractable nausea and vomiting, found to have gastric outlet obstruction, acquired duodenal stenosis -GI was consulted, patient underwent EGD on 8/1, pathology suggested chronic inflammatory change, negative for malignancy or dysplasia.  No evidence of malignancy -General surgery was consulted, patient underwent laparoscopic gastrojejunostomy with feeding GJ tube placement on 8/4 ( Dr. Johney Maine) -NG tube discontinued on 8/5 -GI recommended repeating EGD in 4 to 6-week with Dr. Silverio Decamp to reevaluate the duodenal stricture. -J-tube unclogged on 8/11, currently tolerating tube feeds at goal, G tube clamped  -Continue tube feeds only at night, tolerating soft diet -Per dietitian Osmolite 1.5 at 60 cc an hour for 12 hours via J-tube, free water flush with 120 cc before and after infusion to prevent clogging -Continue pro-stat twice daily  Mild leukocytosis Resolved  Moderate protein calorie malnutrition, hypoalbuminemia, lower extremity edema Prealbumin low 9.7 Started pro-stat twice daily Status post albumin IV x3 doses, albumin 2.2 on 8/15  Lower extremity edema -Likely due to third spacing and volume overload, much improved today -I's and O's, improving 15L--> 7.3 L today -Given Lasix 40 mg IV x3 doses, then discontinue   Acute kidney injury -Likely from GI losses from nausea and vomiting -Presented with creatinine of 2.0, baseline around 1.4 -Creatinine improving to 0.9  Hypokalemia -Resolved  Essential hypertension BP stable  GERD -Continue PPI  Hypothyroidism Continue IV Synthroid.  Per surgery, no crushed meds per J-tube  Primary hyperparathyroidism Stable, on Reclast outpatient  OSA CPAP if she tolerates  Obesity BMI 36.9, continue diet and weight control   Code Status: full  DVT Prophylaxis   heparin  Family Communication: Discussed in detail with the patient, all imaging results, lab results explained to the patient    Disposition Plan: DC home in a.m., need to arrange DME tube feed pump.  Case management consulted.    Time Spent in minutes 25 minutes  Procedures:  EGD 8/1  8/4 laparoscopic gastrojejunostomy, feeding gastro-jejunostomy tube  Consultants:   Gastroenterology General surgery  Antimicrobials:   Anti-infectives (From admission, onward)   Start     Dose/Rate Route Frequency Ordered Stop   05/11/19 1800  erythromycin ethylsuccinate (EES) 200 MG/5ML suspension 400 mg     400 mg Per J Tube Every 8 hours 05/11/19 1808 05/24/19 0909   05/08/19 2200  erythromycin ethylsuccinate (EES) 200 MG/5ML suspension 400 mg  Status:  Discontinued      400 mg Per J Tube Every 8 hours 05/08/19 1531 05/11/19 1808   05/06/19 2300  cefoTEtan (CEFOTAN) 2 g in sodium chloride 0.9 % 100 mL IVPB     2 g 200 mL/hr over 30 Minutes Intravenous Every 12 hours 05/06/19 1540 05/06/19 2255   05/06/19 2200  erythromycin ethylsuccinate (EES) 200 MG/5ML suspension 400 mg  Status:  Discontinued     400 mg Oral Every 8 hours 05/06/19 1606 05/08/19 1531   05/06/19 1600  erythromycin (EES) 400 MG/5ML suspension 400 mg  Status:  Discontinued     400 mg Per Tube 3 times daily 05/06/19 1540 05/06/19 1605   05/06/19 0815  cefoTEtan (CEFOTAN) 2 g in sodium chloride 0.9 % 100 mL IVPB     2 g 200 mL/hr over 30 Minutes Intravenous On call to O.R. 05/06/19 0808 05/06/19 1105         Medications  Scheduled Meds: . chlorhexidine  15 mL Mouth Rinse BID  . erythromycin ethylsuccinate  400 mg Per J Tube Q8H  . feeding supplement (OSMOLITE 1.5 CAL)  1,000 mL Per Tube Q24H  . feeding supplement (PRO-STAT SUGAR FREE 64)  30 mL Oral BID  . levothyroxine  50 mcg Oral Q0600  . lip balm  1 application Topical BID  . mouth rinse  15 mL Mouth Rinse q12n4p  . pantoprazole  40 mg Oral BID  . sertraline  50 mg Oral Daily   Continuous Infusions: . sodium chloride Stopped (05/05/19 1505)   PRN Meds:.sodium chloride, acetaminophen (TYLENOL) oral liquid 160 mg/5 mL, acetaminophen, bisacodyl, HYDROcodone-acetaminophen, magic mouthwash, menthol-cetylpyridinium, methocarbamol, morphine injection, ondansetron **OR** ondansetron (ZOFRAN) IV, phenol, simethicone      Subjective:   [name] was seen and examined today.  States legs swelling feel a lot better but had to urinate a lot yesterday.  No fever chills, nausea vomiting or abdominal pain.  Patient denies dizziness, chest pain, shortness of breath.  Objective:   Vitals:   05/17/19 0557 05/17/19 1431 05/17/19 2159 05/18/19 0556  BP: 130/66 (!) 121/58 (!) 143/68 (!) 106/42  Pulse: 88 84 87 93  Resp: 18 (!) 23 15  16   Temp: 98.7 F (37.1 C) 98.4 F (36.9 C) 98.3 F (36.8 C) 98 F (36.7 C)  TempSrc: Oral     SpO2: 97% 95% 97% 97%  Weight:      Height:        Intake/Output Summary (Last 24 hours) at 05/18/2019 1241 Last data filed at 05/18/2019 1201 Gross per 24 hour  Intake 1186.83 ml  Output 6140 ml  Net -4953.17 ml     Wt Readings from Last 3 Encounters:  05/16/19 114.3 kg  04/28/19 99.3 kg  04/22/19 99.3 kg    Physical Exam  General: Alert and oriented x 3, NAD  Eyes:   HEENT:  Atraumatic, normocephalic  Cardiovascular: S1 S2 clear, no murmurs, RRR.  1+ pedal edema b/l  Respiratory: CTAB, no wheezing, rales or rhonchi  Gastrointestinal: Soft, GJ tube +  Ext: 1+ pedal edema bilaterally  Neuro: no new deficits  Musculoskeletal: No cyanosis, clubbing  Skin: No rashes  Psych: Normal affect and demeanor, alert and oriented x3    Data Reviewed:  I have personally reviewed following labs and imaging studies  Micro Results No results found for this or any previous visit (from the past 240 hour(s)).  Radiology Reports Ct Abdomen Pelvis Wo Contrast  Result Date: 05/01/2019 CLINICAL DATA:  Abdominal pain, question diverticulitis patient had endoscopy and said she had a blockage entire duodenum EXAM: CT ABDOMEN AND PELVIS WITHOUT CONTRAST TECHNIQUE: Multidetector CT imaging of the abdomen and pelvis was performed following the standard protocol without IV contrast. COMPARISON:  January 28, 2019 FINDINGS: Lower chest: Mitral valve calcifications are seen. The visualized heart is normal in size. No hiatal hernia. The visualized portions of the lungs are clear. Hepatobiliary: Although limited due to the lack of intravenous contrast, normal in appearance without gross focal abnormality. The patient is status post cholecystectomy. No biliary ductal dilation. Pancreas:  Unremarkable.  No surrounding inflammatory changes. Spleen: Normal in size. Although limited due to the lack of  intravenous contrast, normal in appearance. Adrenals/Urinary Tract: Both adrenal glands appear normal. Again noted are multiple low-density lesions seen within both kidneys the largest seen in the lower pole of the right kidney measuring 3.6 cm. There is a slightly hyperdense lesion seen within the posterior midpole of the left kidney measuring 1 cm as on the prior exam and could represent proteinaceous/hemorrhagic cyst. The remainder are likely simple renal cysts. Again noted are multiple bilateral non-obstructing renal calculi. No perinephric stranding is seen. No ureteral or bladder calculi are noted. Stomach/Bowel: There is a dilated fluid and air-filled stomach to the level of the gastroduodenal junction where there is an gradual tapering. There is air and fluid seen within the third portion of the duodenum. The remainder of the small bowel is grossly normal in appearance. There is sigmoid colonic diverticula with question of minimal stranding seen adjacent to the sigmoid colon, series 2, image 66 which could represent mild sigmoid colonic diverticulitis. No pericolonic collection or free air is seen. Vascular/Lymphatic: There are no enlarged abdominal or pelvic lymph nodes. Scattered aortic atherosclerotic calcifications are seen without aneurysmal dilatation. Reproductive: The uterus and adnexa are unremarkable. Other: No evidence of abdominal wall mass or hernia. Musculoskeletal: No acute or significant osseous findings. The patient is status post decompression and fixation from L2 through L4. IMPRESSION: 1. Dilated fluid and air-filled stomach to the level of the gastric duodenal junction, consistent with the patient's history and recent endoscopy. There is limited visualization of the second portion of the duodenum to the lack of oral contrast. If there is question of gastric motility issues would recommend gastric emptying study. 2. Mild sigmoid colonic diverticulitis. No pericolonic abscess or free air  Electronically Signed   By: Prudencio Pair M.D.   On: 05/01/2019 17:00   [place] Ekg Site Rite  Result Date: 05/05/2019 If Site Rite image not attached, placement could not be confirmed due to current cardiac rhythm.   Lab Data:  CBC: Recent Labs  Lab 05/12/19 0620 05/13/19 0500 05/14/19 0500 05/16/19 0748  WBC  --  6.9 7.8 7.1  HGB 10.2* 10.3* 10.1* 10.5*  HCT 33.0* 32.9* 32.6* 33.4*  MCV  --  96.2 96.7 97.1  PLT  --  348 356 269   Basic Metabolic Panel: Recent Labs  Lab 05/12/19 0620 05/13/19 0500 05/14/19 0500 05/16/19 0748 05/17/19 0338 05/18/19 4854  NA 138 138 140 138 139 139  K 3.8 3.5 3.8 4.4 4.4 4.1  CL 109 109 111 108 105 98  CO2 25 25 23 25 27  37*  GLUCOSE 96 107* 113* 116* 108* 123*  BUN 12 11 9 12 13 18   CREATININE 0.91 0.96 0.87 0.94 0.94 1.03*  CALCIUM 9.0 8.8* 8.6* 8.9 9.2 9.9  MG 1.7 1.9 1.9  --   --   --   PHOS 2.3* 2.1* 2.1*  --   --   --    GFR: Estimated Creatinine Clearance: 52.2 mL/min (A) (by C-G formula based on SCr of 1.03 mg/dL (H)). Liver Function Tests: Recent Labs  Lab 05/14/19 0500 05/17/19 0338  AST 20  --   ALT 19  --   ALKPHOS 124  --   BILITOT 0.3  --   PROT 4.5*  --   ALBUMIN 1.9* 2.2*   No results for input(s): LIPASE, AMYLASE in the last 168 hours. No results for input(s): AMMONIA in the last 168 hours. Coagulation Profile: No results for input(s): INR, PROTIME in the last 168 hours. Cardiac Enzymes: No results for input(s): CKTOTAL, CKMB, CKMBINDEX, TROPONINI in the last 168 hours. BNP (last 3 results) No results for input(s): PROBNP in the last 8760 hours. HbA1C: No results for input(s): HGBA1C in the last 72 hours. CBG: Recent Labs  Lab 05/16/19 2255 05/17/19 0558 05/17/19 1137 05/17/19 2317 05/18/19 0559  GLUCAP 130* 104* 129* 111* 122*   Lipid Profile: No results for input(s): CHOL, HDL, LDLCALC, TRIG, CHOLHDL, LDLDIRECT in the last 72 hours. Thyroid Function Tests: No results for input(s): TSH, T4TOTAL,  FREET4, T3FREE, THYROIDAB in the last 72 hours. Anemia Panel: No results for input(s): VITAMINB12, FOLATE, FERRITIN, TIBC, IRON, RETICCTPCT in the last 72 hours. Urine analysis:    Component Value Date/Time   COLORURINE YELLOW 05/02/2019 0832   APPEARANCEUR CLEAR 05/02/2019 0832   LABSPEC 1.017 05/02/2019 0832   PHURINE 5.0 05/02/2019 0832   GLUCOSEU NEGATIVE 05/02/2019 0832   HGBUR SMALL (A) 05/02/2019 0832   BILIRUBINUR NEGATIVE 05/02/2019 0832   KETONESUR NEGATIVE 05/02/2019 0832   PROTEINUR NEGATIVE 05/02/2019 0832   UROBILINOGEN 0.2 06/01/2010 0915   NITRITE NEGATIVE 05/02/2019 0832   LEUKOCYTESUR SMALL (A) 05/02/2019 0832       M.D. [facility] 05/18/2019, 12:41 PM  Pager: [phone] Between 7am to 7pm - call Pager - [phone]  After 7pm go to [email or web address] - password [email or web address]  Call night coverage person covering after 7pm

## 2019-05-19 LAB — COMPREHENSIVE METABOLIC PANEL
ALT: 16 U/L (ref 0–44)
AST: 20 U/L (ref 15–41)
Albumin: 3 g/dL — ABNORMAL LOW (ref 3.5–5.0)
Alkaline Phosphatase: 97 U/L (ref 38–126)
Anion gap: 10 (ref 5–15)
BUN: 24 mg/dL — ABNORMAL HIGH (ref 8–23)
CO2: 33 mmol/L — ABNORMAL HIGH (ref 22–32)
Calcium: 10 mg/dL (ref 8.9–10.3)
Chloride: 95 mmol/L — ABNORMAL LOW (ref 98–111)
Creatinine, Ser: 1.13 mg/dL — ABNORMAL HIGH (ref 0.44–1.00)
GFR calc Af Amer: 53 mL/min — ABNORMAL LOW (ref >60)
GFR calc non Af Amer: 46 mL/min — ABNORMAL LOW (ref >60)
Glucose, Bld: 119 mg/dL — ABNORMAL HIGH (ref 70–99)
Potassium: 4.1 mmol/L (ref 3.5–5.1)
Sodium: 138 mmol/L (ref 135–145)
Total Bilirubin: 0.5 mg/dL (ref 0.3–1.2)
Total Protein: 5.7 g/dL — ABNORMAL LOW (ref 6.5–8.1)

## 2019-05-19 LAB — CBC
HCT: 35 % — ABNORMAL LOW (ref 36.0–46.0)
Hemoglobin: 10.8 g/dL — ABNORMAL LOW (ref 12.0–15.0)
MCH: 30 pg (ref 26.0–34.0)
MCHC: 30.9 g/dL (ref 30.0–36.0)
MCV: 97.2 fL (ref 80.0–100.0)
Platelets: 478 10*3/uL — ABNORMAL HIGH (ref 150–400)
RBC: 3.6 MIL/uL — ABNORMAL LOW (ref 3.87–5.11)
RDW: 15.7 % — ABNORMAL HIGH (ref 11.5–15.5)
WBC: 8.6 10*3/uL (ref 4.0–10.5)
nRBC: 0 % (ref 0.0–0.2)

## 2019-05-19 LAB — GLUCOSE, CAPILLARY: Glucose-Capillary: 105 mg/dL — ABNORMAL HIGH (ref 70–99)

## 2019-05-19 MED ORDER — STERILE WATER FOR INJECTION IJ SOLN
INTRAMUSCULAR | Status: AC
  Administered 2019-05-19: 06:00:00
  Filled 2019-05-19: qty 10

## 2019-05-19 MED ORDER — OSMOLITE 1.5 CAL PO LIQD: 1000.0000 mL | ORAL | 0 refills | Status: DC

## 2019-05-19 MED ORDER — ERYTHROMYCIN ETHYLSUCCINATE 200 MG/5ML PO SUSR: 400.0000 mg | Freq: Three times a day (TID) | ORAL | 1 refills | Status: DC

## 2019-05-19 MED ORDER — HYDROCODONE-ACETAMINOPHEN 7.5-325 MG/15ML PO SOLN: 5.0000 mL | Freq: Four times a day (QID) | ORAL | 0 refills | Status: DC | PRN

## 2019-05-19 MED ORDER — ALTEPLASE 2 MG IJ SOLR
2.0000 mg | Freq: Once | INTRAMUSCULAR | Status: AC
  Administered 2019-05-19: 2 mg
  Filled 2019-05-19: qty 2

## 2019-05-19 MED ORDER — SIMETHICONE 80 MG PO CHEW: 80.0000 mg | CHEWABLE_TABLET | ORAL | 0 refills | Status: AC | PRN

## 2019-05-19 MED ORDER — PRO-STAT SUGAR FREE PO LIQD: 30.0000 mL | Freq: Two times a day (BID) | ORAL | 0 refills | Status: DC

## 2019-05-19 NOTE — Progress Notes (Signed)
Patient ID: [name], female   DOB: 11-02-1934, 85 y.o.   MRN: [id number]   She is doing well Tolerating po and tube feeds Abdomen soft, tube ok  Plan: Hopefully home today with follow-up with Dr. Johney Maine as outpt

## 2019-05-19 NOTE — TOC Progression Note (Addendum)
Transition of Care [facility]) - Progression Note    Patient Details  Name: DAISHA FILOSA MRN: [id number] Date of Birth: 11/16/1934  [facility] CM/SW Contact  Leeroy Cha, RN Phone Number: 05/19/2019, 9:47 AM  Clinical Narrative:    0947/tcf-Mr. Ozawa/hospital bed is in place and pump for tube feeding. Will alert [facility] karen byrd to pending dcd today for tube feeding. Plan is for husband to come and get at dc. Text message sent to karen byrd alerting to call husband and arrange tube feeding set up  Expected Discharge Plan: [place]    Expected Discharge Plan and Services Expected Discharge Plan: [place]   Discharge Planning Services: CM Consult Post Acute Care Choice: Durable Medical Equipment, Home Health Living arrangements for the past 2 months: Single Family Home Expected Discharge Date: 05/19/19               DME Arranged: Tube feeding pump DME Agency: [employer] Date DME Agency Contacted: 05/18/19 Time DME Agency Contacted: [phone] Representative spoke with at DME Agency: [place]: RN [place] Agency: [place] ([place]) Date [place]: 05/15/19 Time [place]: 1129 Representative spoke with at [place]: [place] ([place]) Interventions    Readmission Risk Interventions No flowsheet data found.

## 2019-05-19 NOTE — Progress Notes (Signed)
Occupational Therapy Treatment Patient Details Name: [name] MRN: [id number] DOB: 01/08/1935 Today's Date: 05/19/2019    History of present illness Pt is an 84 year old female admitted for Gastric Outlet Obstruction from narrowing at D1/D2 and S/P laparoscopic gastrojejunostomy, feeding gastrojejunostomy tube, Dr. Johney Maine, 08/04.  PMH includes:  sleep apnea, osteoporosis, HTN, glaucoma, depression, DM   OT comments  Pt plans to go home this day. Husband will A as needed  Follow Up Recommendations  Home health OT;Supervision/Assistance - 24 hour    Equipment Recommendations  3 in 1 bedside commode    Recommendations for Other Services      Precautions / Restrictions Precautions Precautions: Fall       Mobility Bed Mobility Overal bed mobility: Needs Assistance Bed Mobility: Sit to Supine       Sit to supine: Min guard      Transfers Overall transfer level: Needs assistance   Transfers: Sit to/from Stand;Stand Pivot Transfers Sit to Stand: Min guard Stand pivot transfers: Min guard            Balance Overall balance assessment: Needs assistance Sitting-balance support: Bilateral upper extremity supported Sitting balance-Leahy Scale: Normal     Standing balance support: Bilateral upper extremity supported Standing balance-Leahy Scale: Poor Standing balance comment: reliant on UE support                            ADL either performed or assessed with clinical judgement   ADL Overall ADL's : Needs assistance/impaired     Grooming: Standing;Cueing for safety;Min guard;Wash/dry face               Lower Body Dressing: Moderate assistance;Cueing for compensatory techniques;Cueing for safety Lower Body Dressing Details (indicate cue type and reason): huband will A as well as pt has AE Toilet Transfer: Min guard;Cueing for safety;Cueing for sequencing;Stand-pivot   Toileting- Clothing Manipulation and Hygiene: Sit to/from stand;Cueing  for compensatory techniques;Cueing for safety;Moderate assistance Toileting - Clothing Manipulation Details (indicate cue type and reason): husband will A.  Educated in option of toilet aid             Vision Patient Visual Report: No change from baseline            Cognition Arousal/Alertness: Awake/alert Behavior During Therapy: WFL for tasks assessed/performed Overall Cognitive Status: Within Functional Limits for tasks assessed                                                     Pertinent Vitals/ Pain       Pain Assessment: No/denies pain     Prior Functioning/Environment              Frequency  Min 2X/week        Progress Toward Goals  OT Goals(current goals can now be found in the care plan section)  Progress towards OT goals: Progressing toward goals  Acute Rehab OT Goals Patient Stated Goal: to learn how to manage her feedings OT Goal Formulation: With patient Time For Goal Achievement: 05/23/19 Potential to Achieve Goals: Good  Plan Discharge plan remains appropriate       AM-PAC OT "6 Clicks" Daily Activity     Outcome Measure   Help from another person eating meals?: None Help from another  person taking care of personal grooming?: None Help from another person toileting, which includes using toliet, bedpan, or urinal?: A Little Help from another person bathing (including washing, rinsing, drying)?: A Little Help from another person to put on and taking off regular upper body clothing?: A Little Help from another person to put on and taking off regular lower body clothing?: A Little 6 Click Score: 20    End of Session Equipment Utilized During Treatment: Gait belt;Rolling walker  OT Visit Diagnosis: Pain;Unsteadiness on feet (R26.81);Other abnormalities of gait and mobility (R26.89);Muscle weakness (generalized) (M62.81) Pain - part of body: (abdomen)   Activity Tolerance Patient tolerated treatment well   Patient  Left in bed;with call bell/phone within reach;with bed alarm set   Nurse Communication Mobility status        Time: 3428-7681 OT Time Calculation (min): 18 min  Charges: OT General Charges $OT Visit: 1 Visit OT Treatments $Self Care/Home Management : 8-22 mins  Kari Baars, [place] [phone] Office- [phone]      Kip Cropp, Edwena Felty D 05/19/2019, 12:47 PM

## 2019-05-19 NOTE — Plan of Care (Signed)
Patient discharged to home with home health. Patient transported via wheelchair. All questions were answered.

## 2019-05-19 NOTE — Discharge Summary (Addendum)
Physician Discharge Summary   Patient ID: [name] MRN: [id number] DOB/AGE: 82-Jul-1936 82 y.o.  Admit date: 05/01/2019 Discharge date: 05/19/2019  Primary Care Physician:  Burnard Bunting, MD   Recommendations for Outpatient Follow-up:  1. Follow up with PCP in 1-2 weeks 2. Patient recommended nighttime tube feedings 3. GI recommended repeating EGD in 4 to 6-week with Dr. Silverio Decamp to reevaluate the duodenal stricture.   Home Health: Home health PT, RN, home health aide Equipment/Devices: Hospital bed, DME tube feeding and tube feed pump  Discharge Condition: stable  CODE STATUS: FULL  Diet recommendation: Soft diet -Osmolite 1.5 @ 60 ml/hr x 12 hours via J-tube. AT NIGHT -This will provide patient with 1080 kcal (65% of needs) and 45g protein (64% of needs) and 548 ml H2O. -Recommend free water flush with 120 ml before and after infusion to prevent clogging.  -Continue Prostat liquid protein PO 30 ml BID with meals, each supplement provides 100 kcal, 15 grams protein.  Discharge Diagnoses:    . Intractable nausea and vomiting with gastric outlet obstruction  Acquired duodenal  stenosis . Depression . Hypertension . Hyperparathyroidism, primary ([place]) . OSA (obstructive sleep apnea) . Hypothyroid . Obesity, Class II, BMI 35-39.9 . CKD (chronic kidney disease) stage 3, GFR 30-59 ml/min (HCC) Moderate protein calorie malnutrition Hypoalbuminemia  lower extremity edema likely due to hypoalbuminemia/third spacing and volume overload Acute kidney injury Hypo-kalemia  Consults:   Gastroenterology General surgery    Allergies:   Allergies  Allergen Reactions  . Codeine Nausea And Vomiting  . Contrast Media [Iodinated Diagnostic Agents] Hives, Shortness Of Breath and Itching  . Iohexol Hives and Other (See Comments)    REACTION: " TROUBLE BREATHING"  . Aspirin     In high doses bleeding  . Fentanyl     Altered mental state     DISCHARGE  MEDICATIONS: Allergies as of 05/19/2019      Reactions   Codeine Nausea And Vomiting   Contrast Media [iodinated Diagnostic Agents] Hives, Shortness Of Breath, Itching   Iohexol Hives, Other (See Comments)   REACTION: " TROUBLE BREATHING"   Aspirin    In high doses bleeding   Fentanyl    Altered mental state      Medication List    STOP taking these medications   triamterene-hydrochlorothiazide 37.5-25 MG tablet Commonly known as: MAXZIDE-25     TAKE these medications   albuterol 108 (90 Base) MCG/ACT inhaler Commonly known as: VENTOLIN HFA Inhale 2 puffs into the lungs every 6 (six) hours as needed for wheezing or shortness of breath.   dorzolamide 2 % ophthalmic solution Commonly known as: TRUSOPT Place 1 drop into both eyes 2 (two) times daily.   erythromycin ethylsuccinate 200 MG/5ML suspension Commonly known as: EES 10 mLs (400 mg total) by Per J Tube route every 8 (eight) hours.   feeding supplement (OSMOLITE 1.5 CAL) Liqd Place 1,000 mLs into feeding tube daily. At night   feeding supplement (PRO-STAT SUGAR FREE 64) Liqd Take 30 mLs by mouth 2 (two) times daily.   fluticasone 50 MCG/ACT nasal spray Commonly known as: Flonase Place 2 sprays into both nostrils daily. What changed:   when to take this  reasons to take this   HYDROcodone-acetaminophen 7.5-325 mg/15 ml solution Commonly known as: HYCET Take 5 mLs by mouth every 6 (six) hours as needed for moderate pain or severe pain.   levothyroxine 50 MCG tablet Commonly known as: SYNTHROID Take 50 mcg by mouth daily.  loperamide 2 MG capsule Commonly known as: IMODIUM Take 2 mg by mouth as needed for diarrhea or loose stools.   losartan 100 MG tablet Commonly known as: COZAAR TAKE 1 TABLET EACH DAY. What changed: See the new instructions.   Protonix 40 MG tablet Generic drug: pantoprazole Take 40 mg by mouth daily.   Reclast 5 MG/100ML Soln injection Generic drug: zoledronic acid Inject 5 mg  into the vein yearly. (Start June 2015)   Rocklatan 0.02-0.005 % Soln Generic drug: Netarsudil-Latanoprost Place 1 drop into both eyes at bedtime.   sertraline 50 MG tablet Commonly known as: ZOLOFT Take 50 mg by mouth daily.   simethicone 80 MG chewable tablet Commonly known as: MYLICON Chew 1 tablet (80 mg total) by mouth every 4 (four) hours as needed for flatulence (also available OTC).            Durable Medical Equipment  (From admission, onward)         Start     Ordered   05/18/19 1652  For home use only DME Tube feeding  Once    Comments: Transition to night feeds that meet 50-75% of estimated needs: -Osmolite 1.5 @ 60 ml/hr x 12 hours via J-tube. -This will provide patient with 1080 kcal (65% of needs) and 45g protein (64% of needs) and 548 ml H2O. -Recommend free water flush with 120 ml before and after infusion to prevent clogging.  -Continue Prostat liquid protein PO 30 ml BID with meals, each supplement provides 100 kcal, 15 grams protein   05/18/19 1651   05/17/19 0947  For home use only DME Tube feeding pump  Once    Question:  Length of Need  Answer:  6 Months   05/17/19 0946   05/13/19 1554  For home use only DME Hospital bed  Once    Question Answer Comment  Length of Need Lifetime   The above medical condition requires: Patient requires the ability to reposition frequently   Bed type Semi-electric      05/13/19 1554           Brief H and P: For complete details please refer to admission H and P, but in brief 84 year old female with history of DM-2, GERD, HTN, morbid obesity, hyperlipidemia, diverticulosis, hyperparathyroidism, anxiety disorder and bronchiectasis; admitted on7/30/2020, presented with complaint ofnausea and vomiting, was found to havegastric outlet obstruction concerning for malignancy. Had EGD by gastroenterology on 05/03/2019 revealed a esophagitis, nonbleeding erosive gastropathy, gastric ulcer with a clean ulcer base,  multiple gastric polyps, acquired duodenal stenosis and mucosal change in the duodenum. Biopsy suggested scarring from chronic inflammatory changes but negative for malignancy or dysplasia. Underwent laparoscopic gastrojejunostomy and placement of feeding gastrojejunostomy tube by Dr. Johney Maine on 05/06/2019.   Hospital Course:    Intractable nausea and vomiting, found to have gastric outlet obstruction, acquired duodenal stenosis -GI was consulted, patient underwent EGD on 8/1, pathology suggested chronic inflammatory change, negative for malignancy or dysplasia.  No evidence of malignancy -General surgery was consulted, patient underwent laparoscopic gastrojejunostomy with feeding GJ tube placement on 8/4 ( Dr. Johney Maine) -NG tube discontinued on 8/5. GI recommended repeating EGD in 4 to 6-week with Dr. Silverio Decamp to reevaluate the duodenal stricture. -J-tube unclogged on 8/11. G tube clamped -Continue tube feeds only at night, tolerating soft diet -Per dietitian Osmolite 1.5 at 60 cc an hour for 12 hours via J-tube, free water flush with 120 cc before and after infusion to prevent clogging -Continue pro-stat twice daily  Moderate protein calorie malnutrition, hypoalbuminemia, lower extremity edema Prealbumin low 9.7, patient was placed on pro-stat twice daily Status post albumin IV x3 doses Albumin level improved to 3.0 at the time of discharge  Lower extremity edema -Likely due to third spacing secondary to hypoalbuminemia and volume overload, much improved -I's and O's, improving 15L--> 6.4L -Patient was given Lasix 40 mg IV x3 doses  Acute kidney injury on CKD stage III -Likely from GI losses from nausea and vomiting -Presented with creatinine of 2.0, baseline around 1.4 -Creatinine stable, 1.1.   Hypokalemia -Resolved  Essential hypertension BP stable  GERD -Continue PPI  Hypothyroidism Continue Synthroid  Primary hyperparathyroidism Stable, on Reclast  outpatient  OSA CPAP if she tolerates  Obesity BMI 36.9, continue diet and weight control   Day of Discharge S: Feels better, feels ready to go home, lower extremity edema improving, tolerating soft diet  BP 121/64 (BP Location: Right Arm)   Pulse 82   Temp 98.1 F (36.7 C) (Oral)   Resp 18   Ht 5\' 3"  (1.6 m)   Wt 114.3 kg   SpO2 95%   BMI 44.64 kg/m   Physical Exam: General: Alert and awake oriented x3 not in any acute distress. HEENT: anicteric sclera, pupils reactive to light and accommodation CVS: S1-S2 clear no murmur rubs or gallops Chest: clear to auscultation bilaterally, no wheezing rales or rhonchi Abdomen: soft nontender, GJ tube Extremities: no cyanosis, clubbing.  Bilateral trace edema Neuro: Cranial nerves II-XII intact, no focal neurological deficits   The results of significant diagnostics from this hospitalization (including imaging, microbiology, ancillary and laboratory) are listed below for reference.      Procedures/Studies:  Ct Abdomen Pelvis Wo Contrast  Result Date: 05/01/2019 CLINICAL DATA:  Abdominal pain, question diverticulitis patient had endoscopy and said she had a blockage entire duodenum EXAM: CT ABDOMEN AND PELVIS WITHOUT CONTRAST TECHNIQUE: Multidetector CT imaging of the abdomen and pelvis was performed following the standard protocol without IV contrast. COMPARISON:  January 28, 2019 FINDINGS: Lower chest: Mitral valve calcifications are seen. The visualized heart is normal in size. No hiatal hernia. The visualized portions of the lungs are clear. Hepatobiliary: Although limited due to the lack of intravenous contrast, normal in appearance without gross focal abnormality. The patient is status post cholecystectomy. No biliary ductal dilation. Pancreas:  Unremarkable.  No surrounding inflammatory changes. Spleen: Normal in size. Although limited due to the lack of intravenous contrast, normal in appearance. Adrenals/Urinary Tract: Both  adrenal glands appear normal. Again noted are multiple low-density lesions seen within both kidneys the largest seen in the lower pole of the right kidney measuring 3.6 cm. There is a slightly hyperdense lesion seen within the posterior midpole of the left kidney measuring 1 cm as on the prior exam and could represent proteinaceous/hemorrhagic cyst. The remainder are likely simple renal cysts. Again noted are multiple bilateral non-obstructing renal calculi. No perinephric stranding is seen. No ureteral or bladder calculi are noted. Stomach/Bowel: There is a dilated fluid and air-filled stomach to the level of the gastroduodenal junction where there is an gradual tapering. There is air and fluid seen within the third portion of the duodenum. The remainder of the small bowel is grossly normal in appearance. There is sigmoid colonic diverticula with question of minimal stranding seen adjacent to the sigmoid colon, series 2, image 66 which could represent mild sigmoid colonic diverticulitis. No pericolonic collection or free air is seen. Vascular/Lymphatic: There are no enlarged abdominal or pelvic lymph  nodes. Scattered aortic atherosclerotic calcifications are seen without aneurysmal dilatation. Reproductive: The uterus and adnexa are unremarkable. Other: No evidence of abdominal wall mass or hernia. Musculoskeletal: No acute or significant osseous findings. The patient is status post decompression and fixation from L2 through L4. IMPRESSION: 1. Dilated fluid and air-filled stomach to the level of the gastric duodenal junction, consistent with the patient's history and recent endoscopy. There is limited visualization of the second portion of the duodenum to the lack of oral contrast. If there is question of gastric motility issues would recommend gastric emptying study. 2. Mild sigmoid colonic diverticulitis. No pericolonic abscess or free air Electronically Signed   By: Prudencio Pair M.D.   On: 05/01/2019 17:00    [place] Ekg Site Rite  Result Date: 05/05/2019 If Site Rite image not attached, placement could not be confirmed due to current cardiac rhythm.     LAB RESULTS: Basic Metabolic Panel: Recent Labs  Lab 05/14/19 0500  05/18/19 0314 05/19/19 0557  NA 140   < > 139 138  K 3.8   < > 4.1 4.1  CL 111   < > 98 95*  CO2 23   < > 37* 33*  GLUCOSE 113*   < > 123* 119*  BUN 9   < > 18 24*  CREATININE 0.87   < > 1.03* 1.13*  CALCIUM 8.6*   < > 9.9 10.0  MG 1.9  --   --   --   PHOS 2.1*  --   --   --    < > = values in this interval not displayed.   Liver Function Tests: Recent Labs  Lab 05/14/19 0500 05/17/19 0338 05/19/19 0557  AST 20  --  20  ALT 19  --  16  ALKPHOS 124  --  97  BILITOT 0.3  --  0.5  PROT 4.5*  --  5.7*  ALBUMIN 1.9* 2.2* 3.0*   No results for input(s): LIPASE, AMYLASE in the last 168 hours. No results for input(s): AMMONIA in the last 168 hours. CBC: Recent Labs  Lab 05/16/19 0748 05/19/19 0557  WBC 7.1 8.6  HGB 10.5* 10.8*  HCT 33.4* 35.0*  MCV 97.1 97.2  PLT 393 478*   Cardiac Enzymes: No results for input(s): CKTOTAL, CKMB, CKMBINDEX, TROPONINI in the last 168 hours. BNP: Invalid input(s): POCBNP CBG: Recent Labs  Lab 05/18/19 2327 05/19/19 0507  GLUCAP 117* 105*      Disposition and Follow-up: Discharge Instructions    Increase activity slowly   Complete by: As directed        DISPOSITION:  [place]    Michael Boston, MD. Go in 3 week(s).   Specialty: General Surgery Contact information: [address] [place] [address] [phone]        Burnard Bunting, MD. Schedule an appointment as soon as possible for a visit in 2 week(s).   Specialty: Internal Medicine Contact information: [place] [address] [phone]        Mauri Pole, MD. Schedule an appointment as soon as possible for a visit in 4 week(s).   Specialty:  Gastroenterology Contact information: [place] [address] [phone]            Time coordinating discharge:  35 mins     Signed:   Estill Cotta M.D. [facility] 05/19/2019, 12:40 PM

## 2019-05-20 NOTE — TOC Transition Note (Signed)
Transition of Care [facility]) - CM/SW Discharge Note   Patient Details  Name: [name] MRN: [id number] Date of Birth: [birth year]  [facility] CM/SW Contact:  Leeroy Cha, RN Phone Number: 05/20/2019, 10:52 AM   Clinical Narrative:    Paper work for the enteral feedings obtained signed and emailed by scan back to [employer] with [employer].         Patient Goals and CMS Choice Patient states their goals for this hospitalization and ongoing recovery are:: to go home CMS Medicare.gov Compare Post Acute Care list provided to:: Patient Choice offered to / list presented to : Patient  Discharge Placement                       Discharge Plan and Services   Discharge Planning Services: CM Consult Post Acute Care Choice: Durable Medical Equipment, Home Health          DME Arranged: Tube feeding pump DME Agency: [facility] Date DME Agency Contacted: 05/18/19 Time DME Agency Contacted: [phone] Representative spoke with at DME Agency: [place]: RN [place] Agency: [place] ([place]) Date [place]: 05/15/19 Time [place]: 1129 Representative spoke with at [place]: [place] ([place]) Interventions     Readmission Risk Interventions No flowsheet data found.

## 2019-06-02 ENCOUNTER — Other Ambulatory Visit (HOSPITAL_COMMUNITY): Payer: Self-pay | Admitting: Surgery

## 2019-06-02 DIAGNOSIS — Z934 Other artificial openings of gastrointestinal tract status: Secondary | ICD-10-CM

## 2019-06-03 ENCOUNTER — Ambulatory Visit (HOSPITAL_COMMUNITY)
Admission: RE | Admit: 2019-06-03 | Discharge: 2019-06-03 | Disposition: A | Discharge status: Home / Self Care | Payer: Medicare Other | Source: Ambulatory Visit | Attending: Surgery | Admitting: Surgery

## 2019-06-03 ENCOUNTER — Encounter (HOSPITAL_COMMUNITY): Payer: Self-pay | Admitting: Interventional Radiology

## 2019-06-03 ENCOUNTER — Other Ambulatory Visit: Payer: Self-pay

## 2019-06-03 DIAGNOSIS — Z431 Encounter for attention to gastrostomy: Secondary | ICD-10-CM | POA: Diagnosis present

## 2019-06-03 DIAGNOSIS — Z934 Other artificial openings of gastrointestinal tract status: Secondary | ICD-10-CM

## 2019-06-03 HISTORY — PX: IR GJ TUBE CHANGE: IMG1440

## 2019-06-03 MED ORDER — LIDOCAINE HCL 1 % IJ SOLN
INTRAMUSCULAR | Status: AC
  Filled 2019-06-03: qty 20

## 2019-06-03 MED ORDER — IOHEXOL 300 MG/ML  SOLN
50.0000 mL | Freq: Once | INTRAMUSCULAR | Status: AC | PRN
  Administered 2019-06-03: 13:00:00 35 mL

## 2019-06-03 MED ORDER — LIDOCAINE HCL 1 % IJ SOLN
INTRAMUSCULAR | Status: DC | PRN
  Administered 2019-06-03: 10 mL

## 2019-06-03 MED ORDER — LIDOCAINE VISCOUS HCL 2 % MT SOLN
OROMUCOSAL | Status: DC | PRN
  Administered 2019-06-03: 15 mL via OROMUCOSAL

## 2019-06-03 MED ORDER — LIDOCAINE VISCOUS HCL 2 % MT SOLN
OROMUCOSAL | Status: AC
  Filled 2019-06-03: qty 15

## 2019-06-03 NOTE — Procedures (Signed)
Pre procedural Dx: Dysphagia, poorly functioning feeding tube. Post procedural Dx: Same  Successful fluoroscopic guided exchange of 30 Fr GJ tube. Both lumens are ready for immediate use.  EBL: None Complications: None immediate.  Ronny Bacon, MD Pager #: [phone]

## 2019-06-04 ENCOUNTER — Telehealth: Payer: Self-pay | Admitting: Gastroenterology

## 2019-06-04 NOTE — Telephone Encounter (Signed)
Spoke with Mr Goodell. The patient is receiving her feeding through the tube right now. Doing well by his opinion. He plans to bring the patient to the office 06/11/19 for her appointment.

## 2019-06-05 ENCOUNTER — Telehealth: Payer: Self-pay | Admitting: Gastroenterology

## 2019-06-05 ENCOUNTER — Ambulatory Visit (INDEPENDENT_AMBULATORY_CARE_PROVIDER_SITE_OTHER)
Admission: RE | Admit: 2019-06-05 | Discharge: 2019-06-05 | Disposition: A | Discharge status: Home / Self Care | Payer: Medicare Other | Source: Ambulatory Visit | Attending: Gastroenterology | Admitting: Gastroenterology

## 2019-06-05 ENCOUNTER — Other Ambulatory Visit: Payer: Self-pay

## 2019-06-05 ENCOUNTER — Encounter: Payer: Self-pay | Admitting: Gastroenterology

## 2019-06-05 DIAGNOSIS — R1110 Vomiting, unspecified: Secondary | ICD-10-CM

## 2019-06-05 DIAGNOSIS — K311 Adult hypertrophic pyloric stenosis: Secondary | ICD-10-CM | POA: Diagnosis not present

## 2019-06-05 NOTE — Telephone Encounter (Signed)
Spoke with Mr Gutt. Instructed to bring the patient to [facility] now for her abdominal xray. Order is in. Instructed to come to the office to sign the consent form for the EGD. He will receive the written instructions and the information about the EGD at that time. Per the discharge documentation, the home health agency that is managing the tube feeding is [facility] (former [facility] Call placed the [facility]. Left a message for a return call about the referral for calorie count and also to adjust the tube feeds.  If she is able to meet her calorie requirement by oral intake, we can discontinue the tube feeds.

## 2019-06-05 NOTE — Telephone Encounter (Signed)
Spoke with Mr Gaylor and Mrs. Garced the patient on speaker phone. The patient is on Osmolite 1.5 calorie. The feedings are in the afternoon for 8 hours continuous flow total of 800 ml. She takes her final dose of Augmentin today. She has had soft to loose bowel movements. Patient vomited last night after her tube feeding was complete. She complains of gas and bloating. She feels slightly nauseous this morning, but states she does plan to eat. She denies fever. Mr Krut states he "expects you to fix this, it has gone on way too long, it is a GI problem and if it is wrong, then you need to tell us who will fix it."

## 2019-06-05 NOTE — Telephone Encounter (Signed)
Pt's husband called stating that pt was supposed to be scheduled for an imaging test today at 3:00pm. He is inquiring why he has not received a call with that information yet. Pls call him.

## 2019-06-05 NOTE — Telephone Encounter (Signed)
Called Mr. Ledbetter.  Torya is doing better this morning no vomiting.  She is eating small portions but he feels her dietary habits are getting worse and she is mostly only picking at food.  We will obtain abdominal x-ray two-view to verify the position of the GJ tube.  Schedule for EGD at [facility], next available appointment  Send referral to nutrition for calorie count and also to adjust the tube feeds.  If she is able to meet her calorie requirement by oral intake, we can discontinue the tube feeds.

## 2019-06-09 ENCOUNTER — Telehealth: Payer: Self-pay

## 2019-06-09 NOTE — Telephone Encounter (Signed)
Spoke to Dr. Silverio Decamp and gave her recommendations to the patients spouse. He states he will contact the PCP and call me back. He states he really does not want her procedure delayed, because her feeding tube is giving her a "hard time."

## 2019-06-09 NOTE — Telephone Encounter (Signed)
Covid-19 screening questions   Do you now or have you had a fever in the last 14 days? NO   Do you have any respiratory symptoms of shortness of breath or cough now or in the last 14 days? YES  Do you have any family members or close contacts with diagnosed or suspected Covid-19 in the past 14 days? NO   Have you been tested for Covid-19 and found to be positive? NO     Patient states she's had a dry hacking cough for the past two days. States she has some sinus drainage as well. Is she ok to proceed with her procedure ?

## 2019-06-09 NOTE — Telephone Encounter (Signed)
Patient's spouse called me back, stated that the PCP office is closed d/t the holiday. He seemed a little frustrated and requested her procedure still be done tomorrow. Dr. Silverio Decamp aware.

## 2019-06-10 ENCOUNTER — Other Ambulatory Visit (HOSPITAL_COMMUNITY)
Admission: RE | Admit: 2019-06-10 | Discharge: 2019-06-10 | Disposition: A | Discharge status: Home / Self Care | Payer: Medicare Other | Source: Ambulatory Visit | Attending: Gastroenterology | Admitting: Gastroenterology

## 2019-06-10 ENCOUNTER — Other Ambulatory Visit: Payer: Self-pay

## 2019-06-10 ENCOUNTER — Encounter: Payer: Medicare Other | Admitting: Gastroenterology

## 2019-06-10 DIAGNOSIS — Z20828 Contact with and (suspected) exposure to other viral communicable diseases: Secondary | ICD-10-CM | POA: Diagnosis present

## 2019-06-10 DIAGNOSIS — Z1159 Encounter for screening for other viral diseases: Secondary | ICD-10-CM

## 2019-06-10 DIAGNOSIS — Z01812 Encounter for preprocedural laboratory examination: Secondary | ICD-10-CM | POA: Insufficient documentation

## 2019-06-10 NOTE — Telephone Encounter (Signed)
[place] [phone] Left a message for a return call. Wait time on the call was 55 minutes(?)

## 2019-06-10 NOTE — Telephone Encounter (Signed)
I have ordered the test at [facility]. Please be advised this is not the rapid test.

## 2019-06-10 NOTE — Telephone Encounter (Signed)
Called [name], informed him that given her symptoms of cough and sinusitis will need to have negative Covid-19 test before we can proceed. He will call Dr Harley Hallmark office this AM to check if she can getting the testing there and if they have rapid test availability, will reschedule once test results are back.  He will call us back to update.

## 2019-06-10 NOTE — Telephone Encounter (Signed)
After speaking with Dr. Silverio Decamp, she wants Blase Mess, LPN to set patient up for COVID testing at [facility].  Once patient is tested negative we can try and reschedule procedure for this Friday.  Eustaquio Maize will call husband at [phone] to arrange test.

## 2019-06-10 NOTE — Telephone Encounter (Signed)
Ok thank you 

## 2019-06-10 NOTE — Telephone Encounter (Signed)
Pt's husband called states pt cannot have COVID-19 test with Dr.Aaronson's office. Phone # [phone].

## 2019-06-10 NOTE — Telephone Encounter (Signed)
Called and spoke with Mr. [name] and told him to have her test for Covid 19 and once she has a negative test result to call our office back and we can reschedule her procedure.  He seemed upset but stated he understands, no further questions.

## 2019-06-10 NOTE — Telephone Encounter (Signed)
Spoke with the home health agency providing the tube feeding for the patient. Darius Bump Registered Dietician will contact the patient and spouse tomorrow. They will discuss calorie count and adjustment of tube feedings. The goal is  discontinuation of the tube feedings transitioning back to by mouth.  We also discussed the visit tomorrow with Dr Silverio Decamp must be virtual or rescheduled. COVID19 screeningwill not result quickly enough to be able to bring her into the office. He expresses understanding and opts for the virtual visit.  Patient will see Dr Johney Maine on Monday 06/16/19.

## 2019-06-11 ENCOUNTER — Telehealth: Payer: Self-pay | Admitting: Gastroenterology

## 2019-06-11 ENCOUNTER — Ambulatory Visit (INDEPENDENT_AMBULATORY_CARE_PROVIDER_SITE_OTHER): Payer: Medicare Other | Admitting: Gastroenterology

## 2019-06-11 ENCOUNTER — Encounter: Payer: Self-pay | Admitting: Gastroenterology

## 2019-06-11 ENCOUNTER — Telehealth: Payer: Self-pay | Admitting: General Surgery

## 2019-06-11 DIAGNOSIS — K9422 Gastrostomy infection: Secondary | ICD-10-CM | POA: Diagnosis not present

## 2019-06-11 DIAGNOSIS — K219 Gastro-esophageal reflux disease without esophagitis: Secondary | ICD-10-CM

## 2019-06-11 DIAGNOSIS — Z934 Other artificial openings of gastrointestinal tract status: Secondary | ICD-10-CM

## 2019-06-11 DIAGNOSIS — L03319 Cellulitis of trunk, unspecified: Secondary | ICD-10-CM

## 2019-06-11 DIAGNOSIS — R112 Nausea with vomiting, unspecified: Secondary | ICD-10-CM

## 2019-06-11 LAB — NOVEL CORONAVIRUS, NAA (HOSP ORDER, SEND-OUT TO REF LAB; TAT 18-24 HRS): SARS-CoV-2, NAA: NOT DETECTED

## 2019-06-11 NOTE — Telephone Encounter (Signed)
Ok thanks 

## 2019-06-11 NOTE — Patient Instructions (Addendum)
If you are age 82 or older, your body mass index should be between 23-30. Your There is no height or weight on file to calculate BMI. If this is out of the aforementioned range listed, please consider follow up with your Primary Care Provider.  If you are age 65 or younger, your body mass index should be between 19-25. Your There is no height or weight on file to calculate BMI. If this is out of the aformentioned range listed, please consider follow up with your Primary Care Provider.   Call Dr. Clyda Greener office for management of [place] tube and to discuss removal of it  Come to ER if has persistent discharge and pain for evaluation to exclude cellulitis at G-tube insertion site.  [place] ED to advise them the patient may come for treatment. Spoke with Dr Johny Blamer.  Thank you for choosing me and [facility]  Harl Bowie, MD

## 2019-06-11 NOTE — Telephone Encounter (Signed)
Patient husband states that Dr. Johney Maine removed patient's tube today

## 2019-06-11 NOTE — Telephone Encounter (Signed)
Pt's husband call ed inform Dr. Silverio Decamp that he will be taking pt to see Dr. Johney Maine today at 3:00pm.

## 2019-06-11 NOTE — Telephone Encounter (Signed)
[name] reports the tube site is red, swollen, painful and he is seeing "pus." He will show you at the virtual visit.  He has spoken with the register dietician. She will come to the home next week. Goal is to d/c the tube feedings and  adequate caloric intake by mouth.

## 2019-06-11 NOTE — Progress Notes (Signed)
[name]    [id number]    05-27-1935  Primary Care Physician:Aronson, Delfino Lovett, MD  Referring Physician: Burnard Bunting, MD [address] [place],  [place] [address]  This service was provided via  telemedicine due to [place] 19 pandemic.  I connected with@ on 06/11/19 at  1:10 PM EDT by a video enabled telemedicine application and verified that I am speaking with the correct person using two identifiers.  Patient location: Home Provider location: Office   I discussed the limitations, risks, security and privacy concerns of performing an evaluation and management service by video enabled telemedicine application and the availability of in person appointments. I also discussed with the patient that there may be a patient responsible charge related to this service. The patient expressed understanding and agreed to proceed.   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  GJ tube malfunction requiring replacement by IR on June 03, 2019 She has not been using it for past few weeks, it is flushing well but she does not tolerate tube feeds, causing nausea and vomiting. Abdominal x-ray negative for small bowel obstruction but appears the tip of the G-J is in the stomach.  She has been having worsening discharge from the gastrostomy tube site associated with redness and significant pain.  Husband is changing dressing frequently.  Denies any fever  She is tolerating soft diet and is also able to drink Ensure by mouth No constipation, has intermittent diarrhea  Both patient and her husband would like to have the tube removed   Outpatient Encounter Medications as of 06/11/2019  Medication Sig  . albuterol (VENTOLIN HFA) 108 (90 Base) MCG/ACT inhaler Inhale 2 puffs into the lungs every 6 (six) hours as needed for wheezing or shortness of breath.  . Amino Acids-Protein Hydrolys (FEEDING SUPPLEMENT, PRO-STAT SUGAR FREE 64,) LIQD Take 30 mLs by mouth 2 (two) times daily.  . dorzolamide (TRUSOPT) 2 % ophthalmic solution Place 1 drop into both eyes 2 (two) times daily.   Marland Kitchen erythromycin ethylsuccinate (EES) 200 MG/5ML suspension 10 mLs (400 mg total) by Per J Tube route every 8 (eight) hours.  . fluticasone (FLONASE) 50 MCG/ACT nasal spray Place 2 sprays into both nostrils daily. (Patient taking differently: Place 2 sprays into both nostrils at bedtime as needed for allergies. )  . HYDROcodone-acetaminophen (HYCET) 7.5-325 mg/15 ml solution Take 5 mLs by mouth every 6 (six) hours as needed for moderate pain or severe pain.  Marland Kitchen levothyroxine (SYNTHROID) 50 MCG tablet Take 50 mcg by mouth daily.  Marland Kitchen loperamide (IMODIUM) 2 MG capsule Take 2 mg by mouth as needed for diarrhea or loose stools.   Marland Kitchen losartan (COZAAR) 100 MG tablet TAKE 1 TABLET EACH DAY. (Patient taking differently: Take 100 mg by mouth daily. )  . Nutritional Supplements (FEEDING SUPPLEMENT, OSMOLITE 1.5 CAL,) LIQD Place 1,000 mLs into feeding tube daily. At night  . PROTONIX 40 MG tablet Take 40 mg by mouth daily.   Marland Kitchen ROCKLATAN 0.02-0.005 % SOLN Place 1 drop into both eyes at bedtime.  . sertraline (ZOLOFT) 50 MG tablet Take 50 mg by mouth daily.   . simethicone (MYLICON) 80 MG  chewable tablet Chew 1 tablet (80 mg total) by mouth every 4 (four) hours as needed for flatulence (also available OTC).  Marland Kitchen zoledronic acid (RECLAST) 5 MG/100ML SOLN injection Inject 5 mg into the vein yearly. (Start June 2015)   No facility-administered encounter medications on file as of 06/11/2019.     Allergies as of 06/11/2019 - Review Complete 05/06/2019  Allergen Reaction Noted  .  Codeine Nausea And Vomiting 03/17/2014  . Contrast media [iodinated diagnostic agents] Hives, Shortness Of Breath, and Itching 05/16/2018  . Iohexol Hives and Other (See Comments) 02/21/2007  . Aspirin  06/30/2016  . Fentanyl  10/20/2016    Past Medical History:  Diagnosis Date  . Allergy   . Anemia   . Anxiety   . Bronchiectasis ([place]) 10/01/2015  . Cataract    removed both eyes  . Depression   . Diabetes mellitus without complication ([place])    diet controlled DM- no meds   . Diverticulosis of colon (without mention of hemorrhage) 07/17/2007  . Gastritis 07/17/2007  . GERD (gastroesophageal reflux disease)   . Glaucoma   . History of fatty infiltration of liver   . Hyperlipemia   . Hyperparathyroidism ([place])   . Hyperparathyroidism, primary ([place]) 01/01/2017  . Hypertension    under control   . Lumbosacral root lesions, not elsewhere classified 04/13/2014  . Obese   . Osteoarthritis   . Osteoporosis   . PONV (postoperative nausea and vomiting)    Not in last several surgeries  . Renal calculus, right    severe  . Sleep apnea    uses cpap at night   . Thyroid disease     Past Surgical History:  Procedure Laterality Date  . APPENDECTOMY  1974  . BIOPSY  05/03/2019   Procedure: BIOPSY;  Surgeon: Rush Landmark Telford Nab., MD;  Location: [facility];  Service: Gastroenterology;;  . [place]   LEFT--BENIGN   . CESAREAN SECTION  1974  . CESAREAN SECTION  1971  . CHOLECYSTECTOMY  2000  . COLONOSCOPY  2008  . ESOPHAGOGASTRODUODENOSCOPY  2008  . ESOPHAGOGASTRODUODENOSCOPY (EGD) WITH PROPOFOL N/A 05/03/2019   Procedure: ESOPHAGOGASTRODUODENOSCOPY (EGD) WITH PROPOFOL;  Surgeon: Rush Landmark Telford Nab., MD;  Location: [facility];  Service: Gastroenterology;  Laterality: N/A;  . EYE SURGERY     Lasik, Cataract, Hx of Glaucoma  . GASTROJEJUNOSTOMY N/A 05/06/2019   Procedure: LAPAROSCOPIC GASTROJEJUNOSTOMY and feedling gastrojejunostomy tube;  Surgeon: Michael Boston, MD;   Location: [facility];  Service: General;  Laterality: N/A;  . IR [place] TUBE CHANGE  06/03/2019  . KNEE ARTHROPLASTY  2004   RIGHT-TOTAL   . LUMBAR FUSION  2008  . PARATHYROIDECTOMY  2009   X 2--RIGHT INFERIOR AND LEFT SUPERIOR  . PARATHYROIDECTOMY N/A 01/04/2017   Procedure: RIGHT NECK EXPLORATION WITH PARATHYROIDECTOMY;  Surgeon: Armandina Gemma, MD;  Location: [facility];  Service: General;  Laterality: N/A;  . ROTATOR CUFF REPAIR  2000   RIGHT  . TUBAL LIGATION  1974  . UPPER GASTROINTESTINAL ENDOSCOPY      Family History  Problem Relation Age of Onset  . Colon polyps Mother   . Hypertension Mother   . Osteoarthritis Mother   . Diabetes Father   . Stroke Father   . Hernia Father        Esophageal hernia  . Osteoarthritis Maternal Grandmother   . Stroke Maternal Grandfather   . Liver disease Paternal Grandmother   . Stroke Paternal Grandfather   . Colon cancer Neg Hx   .  Esophageal cancer Neg Hx   . Rectal cancer Neg Hx   . Stomach cancer Neg Hx     Social History   Socioeconomic History  . Marital status: Married    Spouse name: Not on file  . Number of children: 2  . Years of education: Not on file  . Highest education level: Not on file  Occupational History  . Occupation: retired  Scientific laboratory technician  . Financial resource strain: Not on file  . Food insecurity    Worry: Not on file    Inability: Not on file  . Transportation needs    Medical: Not on file    Non-medical: Not on file  Tobacco Use  . Smoking status: Former Smoker    Packs/day: 0.25    Years: 15.00    Pack years: 3.75    Types: Cigarettes    Quit date: 09/01/2014    Years since quitting: 4.7  . Smokeless tobacco: Never Used  Substance and Sexual Activity  . Alcohol use: Yes    Alcohol/week: 1.0 standard drinks    Types: 1 Glasses of wine per week    Comment: occasional  . Drug use: No  . Sexual activity: Not on file  Lifestyle  . Physical activity    Days per week: Not on file    Minutes per session: Not  on file  . Stress: Not on file  Relationships  . Social Herbalist on phone: Not on file    Gets together: Not on file    Attends religious service: Not on file    Active member of club or organization: Not on file    Attends meetings of clubs or organizations: Not on file    Relationship status: Not on file  . Intimate partner violence    Fear of current or ex partner: Not on file    Emotionally abused: Not on file    Physically abused: Not on file    Forced sexual activity: Not on file  Other Topics Concern  . Not on file  Social History Narrative   Married, with 2 children   Right handed   Some college   No caffeine      Epworth Sleepiness Scale = 7 (as of 07/21/2015)         Review of systems: Review of Systems as per HPI All other systems reviewed and are negative.   Observations/Objective:   Data Reviewed:  Reviewed labs, radiology imaging, old records and pertinent past GI work up   Assessment and Plan/Recommendations:  84 year old female with history of type 2 diabetes, hypertension, obesity, hyperlipidemia, gastric outlet obstruction secondary to benign duodenal stenosis at the site of prior cholecystectomy status post resection with gastro jejunostomy and placement of a GJ feeding tube Pathology of duodenal resected specimen negative for malignancy, showed scarring with chronic inflammatory changes  She is having significant pain at site of gastrostomy tube with increased discharge, patient would like to get the tube out.  Cannot exclude cellulitis at the site of G-tube insertion.  Advised her to contact Dr. Clyda Greener office or come to ER if she is having persistent discomfort, worsening discharge or develops fever  She is tolerating p.o. intake Follow-up nutrition consult and calorie count  We will schedule EGD once we get the results for coronavirus test  Continue Protonix and antireflux measures   I discussed the assessment and treatment  plan with the patient. The patient was provided an opportunity to ask  questions and all were answered. The patient agreed with the plan and demonstrated an understanding of the instructions.   The patient was advised to call back or seek an in-person evaluation if the symptoms worsen or if the condition fails to improve as anticipated.     Harl Bowie, MD   CC: Burnard Bunting, MD

## 2019-06-11 NOTE — Telephone Encounter (Signed)
[facility] ED per Dr Jillyn Hidden request. Tried to advise them that [name] was told  to go to their ED for treatment of a possible infection around her PEG tube.  Spoke with Dr Johny Blamer and she took the information

## 2019-06-13 ENCOUNTER — Encounter: Payer: Self-pay | Admitting: Gastroenterology

## 2019-06-13 ENCOUNTER — Other Ambulatory Visit: Payer: Self-pay

## 2019-06-13 ENCOUNTER — Ambulatory Visit (AMBULATORY_SURGERY_CENTER): Payer: Medicare Other | Admitting: Gastroenterology

## 2019-06-13 VITALS — BP 96/52 | HR 75 | Temp 97.9°F | Resp 18 | Ht 63.0 in | Wt 219.0 lb

## 2019-06-13 DIAGNOSIS — R112 Nausea with vomiting, unspecified: Secondary | ICD-10-CM | POA: Diagnosis not present

## 2019-06-13 DIAGNOSIS — R1010 Upper abdominal pain, unspecified: Secondary | ICD-10-CM

## 2019-06-13 MED ORDER — SODIUM CHLORIDE 0.9 % IV SOLN: 500.0000 mL | Freq: Once | INTRAVENOUS | Status: DC

## 2019-06-13 NOTE — Op Note (Addendum)
[facility] Patient Name: [name] Procedure Date: 06/13/2019 2:08 PM MRN: [id number] Endoscopist: Mauri Pole , MD Age: 84 Referring MD:  Date of Birth: 02/17/1935 Gender: Female Account #: [id number] Procedure:                Upper GI endoscopy Indications:              Persistent vomiting of unknown cause Medicines:                Monitored Anesthesia Care Procedure:                Pre-Anesthesia Assessment:                           - Prior to the procedure, a History and Physical                            was performed, and patient medications and                            allergies were reviewed. The patient's tolerance of                            previous anesthesia was also reviewed. The risks                            and benefits of the procedure and the sedation                            options and risks were discussed with the patient.                            All questions were answered, and informed consent                            was obtained. Prior Anticoagulants: The patient has                            taken no previous anticoagulant or antiplatelet                            agents. ASA Grade Assessment: III - A patient with                            severe systemic disease. After reviewing the risks                            and benefits, the patient was deemed in                            satisfactory condition to undergo the procedure.                           After obtaining informed consent, the endoscope was  passed under direct vision. Throughout the                            procedure, the patient's blood pressure, pulse, and                            oxygen saturations were monitored continuously. The                            Endoscope was introduced through the mouth, and                            advanced to the jejunum and duodenal bulb. The                            upper GI  endoscopy was accomplished without                            difficulty. The patient tolerated the procedure                            well. Scope In: Scope Out: Findings:                 The esophagus was normal.                           Evidence of a gastrojejunostomy was found in the                            gastric antrum. This was characterized by                            congestion, erythema and an intact staple line.                            jejnum appeared healthy.                           Pre pylorus, pylorus and the duodenal bulb was                            normal. Blind pouch, couldnt extend beyond duodenal                            bulb. Complications:            No immediate complications. Estimated Blood Loss:     Estimated blood loss was minimal. Impression:               - Normal esophagus.                           - A gastrojejunostomy was found, characterized by                            congestion, erythema and an intact staple  line.                            Post op changes otherwise unremarkable exam                           - Normal pylorus and blind pouch duodenal bulb.                           - No specimens collected. Recommendation:           - Patient has a contact number available for                            emergencies. The signs and symptoms of potential                            delayed complications were discussed with the                            patient. Return to normal activities tomorrow.                            Written discharge instructions were provided to the                            patient.                           - Continue present medications.                           - Low residue diet.                           - No ibuprofen, naproxen, or other non-steroidal                            anti-inflammatory drugs.                           - Return to GI office in 6 weeks. Mauri Pole, MD 06/13/2019  2:40:45 PM This report has been signed electronically.

## 2019-06-13 NOTE — Progress Notes (Signed)
Report given to PACU, vss 

## 2019-06-13 NOTE — Progress Notes (Signed)
Pt's states no medical or surgical changes since previsit or office visit.  June Bullock-temp [facility] - vitals 

## 2019-06-13 NOTE — Patient Instructions (Signed)
See the Low Residue Diet handout. No Nsaids , Ibuprofen, naproxen. Return to the GI clinic in 6 weeks.    YOU HAD AN ENDOSCOPIC PROCEDURE TODAY AT [facility]:   Refer to the procedure report that was given to you for any specific questions about what was found during the examination.  If the procedure report does not answer your questions, please call your gastroenterologist to clarify.  If you requested that your care partner not be given the details of your procedure findings, then the procedure report has been included in a sealed envelope for you to review at your convenience later.  YOU SHOULD EXPECT: Some feelings of bloating in the abdomen. Passage of more gas than usual.  Walking can help get rid of the air that was put into your GI tract during the procedure and reduce the bloating. If you had a lower endoscopy (such as a colonoscopy or flexible sigmoidoscopy) you may notice spotting of blood in your stool or on the toilet paper. If you underwent a bowel prep for your procedure, you may not have a normal bowel movement for a few days.  Please Note:  You might notice some irritation and congestion in your nose or some drainage.  This is from the oxygen used during your procedure.  There is no need for concern and it should clear up in a day or so.  SYMPTOMS TO REPORT IMMEDIATELY:    Following upper endoscopy (EGD)  Vomiting of blood or coffee ground material  New chest pain or pain under the shoulder blades  Painful or persistently difficult swallowing  New shortness of breath  Fever of 100F or higher  Black, tarry-looking stools  For urgent or emergent issues, a gastroenterologist can be reached at any hour by calling [phone].   DIET:  We do recommend a small meal at first, but then you may proceed to your regular diet.  Drink plenty of fluids but you should avoid alcoholic beverages for 24 hours.  ACTIVITY:  You should plan to take it easy for the rest  of today and you should NOT DRIVE or use heavy machinery until tomorrow (because of the sedation medicines used during the test).    FOLLOW UP: Our staff will call the number listed on your records 48-72 hours following your procedure to check on you and address any questions or concerns that you may have regarding the information given to you following your procedure. If we do not reach you, we will leave a message.  We will attempt to reach you two times.  During this call, we will ask if you have developed any symptoms of COVID 19. If you develop any symptoms (ie: fever, flu-like symptoms, shortness of breath, cough etc.) before then, please call [phone].  If you test positive for Covid 19 in the 2 weeks post procedure, please call and report this information to [place].    If any biopsies were taken you will be contacted by phone or by letter within the next 1-3 weeks.  Please call us at [phone] if you have not heard about the biopsies in 3 weeks.    SIGNATURES/CONFIDENTIALITY: You and/or your care partner have signed paperwork which will be entered into your electronic medical record.  These signatures attest to the fact that that the information above on your After Visit Summary has been reviewed and is understood.  Full responsibility of the confidentiality of this discharge information lies with you and/or your care-partner.  Thank you for letting us take care of your healthcare needs today.

## 2019-06-17 NOTE — Telephone Encounter (Signed)
  Follow up Call-  Call back number 06/13/2019 04/28/2019  Post procedure Call Back phone  # [phone]  Permission to leave phone message Yes Yes  Some recent data might be hidden     Patient questions:  Do you have a fever, pain , or abdominal swelling? No. Pain Score  0 *  Have you tolerated food without any problems? Yes.    Have you been able to return to your normal activities? Yes.    Do you have any questions about your discharge instructions: Diet   No. Medications  No. Follow up visit  No.  Do you have questions or concerns about your Care? No.  Actions: * If pain score is 4 or above: No action needed, pain <4.  1. Have you developed a fever since your procedure? no  2.   Have you had an respiratory symptoms (SOB or cough) since your procedure? no  3.   Have you tested positive for COVID 19 since your procedure no  4.   Have you had any family members/close contacts diagnosed with the COVID 19 since your procedure?  no   If yes to any of these questions please route to Joylene John, RN and Alphonsa Gin, Therapist, sports.

## 2019-07-01 ENCOUNTER — Telehealth: Payer: Self-pay

## 2019-07-01 NOTE — Telephone Encounter (Signed)
Call to the patient's spouse. Scheduled a follow up appointment. Prefers in office appointment.Scheduled for 08/05/19 at 3pm. Contacted the registered dietician with [facility] [phone] (option1) Darius Bump ext [phone] Thersa Salt ext [phone] They will call the patient's spouse. He is concerned about her caloric intake being too low.

## 2019-08-05 ENCOUNTER — Ambulatory Visit (INDEPENDENT_AMBULATORY_CARE_PROVIDER_SITE_OTHER): Payer: Medicare Other | Admitting: Gastroenterology

## 2019-08-05 ENCOUNTER — Other Ambulatory Visit: Payer: Self-pay

## 2019-08-05 ENCOUNTER — Encounter: Payer: Self-pay | Admitting: Gastroenterology

## 2019-08-05 VITALS — BP 98/50 | HR 76 | Temp 98.2°F | Ht 60.5 in | Wt 205.2 lb

## 2019-08-05 DIAGNOSIS — K315 Obstruction of duodenum: Secondary | ICD-10-CM

## 2019-08-05 DIAGNOSIS — Z980 Intestinal bypass and anastomosis status: Secondary | ICD-10-CM

## 2019-08-05 DIAGNOSIS — R110 Nausea: Secondary | ICD-10-CM

## 2019-08-05 NOTE — Patient Instructions (Signed)
Follow up as needed  If you are age 82 or older, your body mass index should be between 23-30. Your Body mass index is 39.43 kg/m. If this is out of the aforementioned range listed, please consider follow up with your Primary Care Provider.  If you are age 43 or younger, your body mass index should be between 19-25. Your Body mass index is 39.43 kg/m. If this is out of the aformentioned range listed, please consider follow up with your Primary Care Provider.    I appreciate the  opportunity to care for you  Thank You   Harl Bowie , MD

## 2019-08-05 NOTE — Progress Notes (Signed)
[name]    XS:[id number]    07/03/1935  Primary Care Physician:Aronson, Delfino Lovett, MD  Referring Physician: Burnard Bunting, MD [address] [place],  [place] [address]   Chief complaint: Nausea, vomiting  HPI:  47 yr F with history of obesity, CKD, gastric outlet obstruction secondary to benign duodenal stenosis status post gastrojejunostomy here for follow-up visit. She feels much better after the Dennis tube was discontinued.  She is tolerating diet better and feels less fatigued.  She is continuing physical therapy.  She is still struggling with decreased appetite and lack of interest in food but is trying to eat small amounts.  Weight is stable.  She did lose significant weight in the past few months but has stabilized in the last month. Continues to have intermittent nausea but no vomiting.  Denies any dysphagia, heartburn, change in bowel habits, melena or blood per rectum.  Pathology from resected duodenum negative for malignancy or adenoma.  EGD June 13, 2019: Showed gastrojejunal anastomosis with blind duodenal bulb pouch otherwise negative for anastomotic ulcer or any high risk lesions.  Outpatient Encounter Medications as of 08/05/2019  Medication Sig  . benzonatate (TESSALON) 100 MG capsule Take 100 mg by mouth as needed.  . dorzolamide (TRUSOPT) 2 % ophthalmic solution Place 1 drop into both eyes 2 (two) times daily.   . fluticasone (FLONASE) 50 MCG/ACT nasal spray Place 2 sprays into both nostrils daily. (Patient taking differently: Place 2 sprays into both nostrils at bedtime as needed for allergies. )  . furosemide (LASIX) 20 MG tablet Take 20 mg by mouth. Mon, Wed, Frid  . levothyroxine (SYNTHROID) 50 MCG tablet Take 50 mcg by mouth daily.  Marland Kitchen losartan (COZAAR) 100 MG tablet TAKE 1 TABLET EACH DAY. (Patient taking differently: Take 100 mg by mouth daily. )  . mirtazapine (REMERON) 15 MG tablet Take 15 mg by mouth at bedtime.  . ondansetron (ZOFRAN)  4 MG tablet Take 1 tablet by mouth as needed.  . potassium chloride (KLOR-CON) 10 MEQ tablet Take 10 mEq by mouth. Mon, Wed, Frid  . ROCKLATAN 0.02-0.005 % SOLN Place 1 drop into both eyes at bedtime.  . simethicone (MYLICON) 80 MG chewable tablet Chew 1 tablet (80 mg total) by mouth every 4 (four) hours as needed for flatulence (also available OTC).  Marland Kitchen zoledronic acid (RECLAST) 5 MG/100ML SOLN injection Inject 5 mg into the vein yearly. [facility] June 2015)  . [DISCONTINUED] albuterol (VENTOLIN HFA) 108 (90 Base) MCG/ACT inhaler Inhale 2 puffs into the lungs every 6 (six) hours as needed for wheezing or shortness of breath.  . [DISCONTINUED] Amino Acids-Protein Hydrolys (FEEDING SUPPLEMENT, PRO-STAT SUGAR FREE 64,) LIQD Take 30 mLs by mouth 2 (two) times daily. (Patient not taking: Reported on 06/13/2019)  . [DISCONTINUED] erythromycin ethylsuccinate (EES) 200 MG/5ML suspension 10 mLs (400 mg total) by Per J Tube route every 8 (eight) hours.  . [DISCONTINUED] HYDROcodone-acetaminophen (HYCET) 7.5-325 mg/15 ml solution Take 5 mLs by mouth every 6 (six) hours as needed for moderate pain or severe pain.  . [DISCONTINUED] loperamide (IMODIUM) 2 MG capsule Take 2 mg by mouth as needed for diarrhea or loose stools.   . [DISCONTINUED] Nutritional Supplements (FEEDING SUPPLEMENT, OSMOLITE 1.5 CAL,) LIQD Place 1,000 mLs into feeding tube daily. At night  . [DISCONTINUED] PROTONIX 40 MG tablet Take 40 mg by mouth daily.   . [DISCONTINUED] sertraline (ZOLOFT) 50 MG tablet Take 50 mg by mouth daily.  No facility-administered encounter medications on file as of 08/05/2019.     Allergies as of 08/05/2019 - Review Complete 08/05/2019  Allergen Reaction Noted  . Codeine Nausea And Vomiting 03/17/2014  . Contrast media [iodinated diagnostic agents] Hives, Shortness Of Breath, and Itching 05/16/2018  . Iohexol Hives and Other (See Comments) 02/21/2007  . Aspirin  06/30/2016  . Fentanyl  10/20/2016    Past  Medical History:  Diagnosis Date  . Allergy   . Anemia   . Anxiety   . Bronchiectasis ([place]) 10/01/2015  . Cataract    removed both eyes  . Depression   . Diabetes mellitus without complication ([place])    diet controlled DM- no meds   . Diverticulosis of colon (without mention of hemorrhage) 07/17/2007  . Gastritis 07/17/2007  . GERD (gastroesophageal reflux disease)   . Glaucoma   . History of fatty infiltration of liver   . Hyperlipemia   . Hyperparathyroidism ([place])   . Hyperparathyroidism, primary ([place]) 01/01/2017  . Hypertension    under control   . Lumbosacral root lesions, not elsewhere classified 04/13/2014  . Obese   . Osteoarthritis   . Osteoporosis   . PONV (postoperative nausea and vomiting)    Not in last several surgeries  . Renal calculus, right    severe  . Sleep apnea    uses cpap at night   . Thyroid disease     Past Surgical History:  Procedure Laterality Date  . APPENDECTOMY  1974  . BIOPSY  05/03/2019   Procedure: BIOPSY;  Surgeon: Rush Landmark Telford Nab., MD;  Location: [facility];  Service: Gastroenterology;;  . [place]   LEFT--BENIGN   . CESAREAN SECTION  1974  . CESAREAN SECTION  1971  . CHOLECYSTECTOMY  2000  . COLONOSCOPY  2008  . ESOPHAGOGASTRODUODENOSCOPY  2008  . ESOPHAGOGASTRODUODENOSCOPY (EGD) WITH PROPOFOL N/A 05/03/2019   Procedure: ESOPHAGOGASTRODUODENOSCOPY (EGD) WITH PROPOFOL;  Surgeon: Rush Landmark Telford Nab., MD;  Location: [facility];  Service: Gastroenterology;  Laterality: N/A;  . EYE SURGERY     Lasik, Cataract, Hx of Glaucoma  . GASTROJEJUNOSTOMY N/A 05/06/2019   Procedure: LAPAROSCOPIC GASTROJEJUNOSTOMY and feedling gastrojejunostomy tube;  Surgeon: Michael Boston, MD;  Location: [facility];  Service: General;  Laterality: N/A;  . IR [place]le TUBE CHANGE  06/03/2019  . KNEE ARTHROPLASTY  2004   RIGHT-TOTAL   . LUMBAR FUSION  2008  . PARATHYROIDECTOMY  2009   X 2--RIGHT INFERIOR AND LEFT SUPERIOR  . PARATHYROIDECTOMY N/A  01/04/2017   Procedure: RIGHT NECK EXPLORATION WITH PARATHYROIDECTOMY;  Surgeon: Armandina Gemma, MD;  Location: [facility];  Service: General;  Laterality: N/A;  . ROTATOR CUFF REPAIR  2000   RIGHT  . TUBAL LIGATION  1974  . UPPER GASTROINTESTINAL ENDOSCOPY      Family History  Problem Relation Age of Onset  . Colon polyps Mother   . Hypertension Mother   . Osteoarthritis Mother   . Diabetes Father   . Stroke Father   . Hernia Father        Esophageal hernia  . Osteoarthritis Maternal Grandmother   . Stroke Maternal Grandfather   . Liver disease Paternal Grandmother   . Stroke Paternal Grandfather   . Colon cancer Neg Hx   . Esophageal cancer Neg Hx   . Rectal cancer Neg Hx   . Stomach cancer Neg Hx     Social History   Socioeconomic History  . Marital status: Married    Spouse name: Not  on file  . Number of children: 2  . Years of education: Not on file  . Highest education level: Not on file  Occupational History  . Occupation: retired  Scientific laboratory technician  . Financial resource strain: Not on file  . Food insecurity    Worry: Not on file    Inability: Not on file  . Transportation needs    Medical: Not on file    Non-medical: Not on file  Tobacco Use  . Smoking status: Former Smoker    Packs/day: 0.25    Years: 15.00    Pack years: 3.75    Types: Cigarettes    Quit date: 09/01/2014    Years since quitting: 4.9  . Smokeless tobacco: Never Used  Substance and Sexual Activity  . Alcohol use: Yes    Alcohol/week: 1.0 standard drinks    Types: 1 Glasses of wine per week    Comment: occasional  . Drug use: No  . Sexual activity: Not on file  Lifestyle  . Physical activity    Days per week: Not on file    Minutes per session: Not on file  . Stress: Not on file  Relationships  . Social Herbalist on phone: Not on file    Gets together: Not on file    Attends religious service: Not on file    Active member of club or organization: Not on file    Attends  meetings of clubs or organizations: Not on file    Relationship status: Not on file  . Intimate partner violence    Fear of current or ex partner: Not on file    Emotionally abused: Not on file    Physically abused: Not on file    Forced sexual activity: Not on file  Other Topics Concern  . Not on file  Social History Narrative   Married, with 2 children   Right handed   Some college   No caffeine      Epworth Sleepiness Scale = 7 (as of 07/21/2015)         Review of systems: Review of Systems  Constitutional: Negative for fever and chills.  HENT: Negative.   Eyes: Negative for blurred vision.  Respiratory: Negative for cough, shortness of breath and wheezing.   Cardiovascular: Negative for chest pain and palpitations.  Gastrointestinal: as per HPI Genitourinary: Negative for dysuria, urgency, frequency and hematuria.  Musculoskeletal: Negative for myalgias, back pain and joint pain.  Skin: Negative for itching and rash.  Neurological: Negative for dizziness, tremors, focal weakness, seizures and loss of consciousness.  Endo/Heme/Allergies: Positive for seasonal allergies.  Psychiatric/Behavioral: Negative for depression, suicidal ideas and hallucinations.  All other systems reviewed and are negative.   Physical Exam: Vitals:   08/05/19 1455  BP: (!) 98/50  Pulse: 76  Temp: 98.2 F (36.8 C)   Body mass index is 39.43 kg/m. Gen:      No acute distress HEENT:  EOMI, sclera anicteric Neck:     No masses; no thyromegaly Lungs:    Clear to auscultation bilaterally; normal respiratory effort CV:         Regular rate and rhythm; no murmurs Abd:      + bowel sounds; soft, non-tender; no palpable masses, no distension.  G-tube placement site well-healed Ext:    No edema; adequate peripheral perfusion Skin:      Warm and dry; no rash Neuro: alert and oriented x 3 Psych: normal mood and affect  Data Reviewed:  Reviewed labs, radiology imaging, old records and  pertinent past GI work up   Assessment and Plan/Recommendations:  84 year old very pleasant Caucasian female with gastric outlet obstruction secondary to benign duodenal stenosis s/p resection with gastrojejunostomy  Continue small frequent meals with high-protein diet Avoid high-fat or and limit high-fiber diet as tolerated Okay to use Zofran as needed for nausea  Discussed antireflux measures and lifestyle modifications Return as needed  25 minutes was spent face-to-face with the patient. Greater than 50% of the time used for counseling as well as treatment plan and follow-up. She had multiple questions which were answered to her satisfaction  K. Denzil Magnuson , MD    CC: Burnard Bunting, MD

## 2019-08-12 ENCOUNTER — Encounter: Payer: Self-pay | Admitting: Gastroenterology

## 2019-08-21 ENCOUNTER — Encounter: Payer: Self-pay | Admitting: Pulmonary Disease

## 2019-08-21 ENCOUNTER — Other Ambulatory Visit: Payer: Self-pay

## 2019-08-21 ENCOUNTER — Ambulatory Visit: Payer: Medicare Other | Admitting: Pulmonary Disease

## 2019-08-21 VITALS — BP 110/68 | HR 68 | Temp 97.6°F | Ht 63.0 in | Wt 207.4 lb

## 2019-08-21 DIAGNOSIS — G4733 Obstructive sleep apnea (adult) (pediatric): Secondary | ICD-10-CM

## 2019-08-21 DIAGNOSIS — E669 Obesity, unspecified: Secondary | ICD-10-CM

## 2019-08-21 DIAGNOSIS — G4730 Sleep apnea, unspecified: Secondary | ICD-10-CM

## 2019-08-21 NOTE — Progress Notes (Signed)
[name]  Chief Complaint  Patient presents with  . Follow-up    husband reports patient sleeping with mouth open    Constitutional:  BP 110/68 (BP Location: Left Arm, Cuff Size: Large)   Pulse 68   Temp 97.6 F (36.4 C) (Temporal)   Ht 5\' 3"  (1.6 m)   Wt 207 lb 6.4 oz (94.1 kg)   SpO2 96%   BMI 36.74 kg/m   Past Medical History:  HTN, CKD 3b, HLD, GERD, OA, Primary hyperparathyroidism, Nephrolithiasis, Fatty liver, HH, Colon polyp, Diverticulitis, Osteopenia  Brief Summary:  [name] is a 84 y.o. female with obstructive sleep apnea.  She has nasal pillows mask.  Not having sinus congestion, sore throat, dry mouth, or aerophagia.  Goes to bed at midnight and wakes up at 9 am.  Falls asleep quickly.  Wakes up 1 or 2 times to use the bathroom.  Will fall asleep sometimes while watching TV, but only for a brief time.  Her husband has noticed that she gets intermittent episodes of air leak from her mouth while using CPAP.  Will then get shallow breathing and apnea episode.  This happens more around 4 or 5 AM.  Physical Exam:   Appearance - well kempt   ENMT - clear nasal mucosa, midline nasal  septum, no oral exudates, no LAN, trachea midline  Respiratory - normal chest wall, normal respiratory effort, no accessory muscle use, no wheeze/rales  CV - s1s2 regular rate and rhythm, no murmurs, no peripheral edema, radial pulses symmetric  GI - soft, non tender, no masses  Lymph - no adenopathy noted in neck and axillary areas  MSK - sitting in wheelchair  Ext - no cyanosis, clubbing, or joint inflammation noted  Skin - no rashes, lesions, or ulcers  Neuro - normal strength, oriented x 3  Psych - normal mood and affect   Assessment/Plan:   Obstructive sleep apnea. - she is compliant with CPAP and reports benefit - continue auto CPAP  - download shows some air leak - will have her DME refit her mask (likely a hybrid  mask) or she could try chin strap and continue to use nasal pillows mask  Obesity. - discussed importance of weight loss    Patient Instructions  Will have [facility] refit your CPAP mask or try you on a chin strap with your current mask  Follow up in 1 year    Chesley Mires, MD [place] Pager: [phone] 08/21/2019, 11:52 AM  Flow Sheet     Pulmonary tests:  PFT 12/17/15 >> FEV1 2.06 (116%), FEV1% 86, TLC 3.90 (83%), DLCO 69%  Chest imaging:  CT chest 09/23/15 >> multiple pulmonary nodules stable since 2011, mild cylindrical BTX lower lobes, atherosclerosis  Sleep tests:  HST 11/08/17 >> AHI 74.9, SaO2 low 68%. Auto CPAP 07/21/19 to 08/19/19 >> used on 27 of 30 nights with average 7 hrs 11 min.  Average AHI 2.1 with median CPAP 9 and 95 th percentile CPAP 11 cm H2O.  Air leak.  Cardiac tests:  Echo 08/05/15 >> EF 60 to 65%  Medications:   Allergies as of 08/21/2019      Reactions   Codeine Nausea And Vomiting   Contrast Media [iodinated Diagnostic Agents] Hives, Shortness Of Breath, Itching   Iohexol Hives, Other (See Comments)   REACTION: " TROUBLE BREATHING"   Aspirin    In high doses bleeding   Fentanyl    Altered mental state  Medication List       Accurate as of August 21, 2019 11:52 AM. If you have any questions, ask your nurse or doctor.        benzonatate 100 MG capsule Commonly known as: TESSALON Take 100 mg by mouth as needed.   dorzolamide 2 % ophthalmic solution Commonly known as: TRUSOPT Place 1 drop into both eyes 2 (two) times daily.   fluticasone 50 MCG/ACT nasal spray Commonly known as: Flonase Place 2 sprays into both nostrils daily. What changed:   when to take this  reasons to take this   furosemide 20 MG tablet Commonly known as: LASIX Take 20 mg by mouth. Mon, Wed, Frid   levothyroxine 50 MCG tablet Commonly known as: SYNTHROID Take 50 mcg by mouth daily.   losartan 100 MG tablet Commonly  known as: COZAAR TAKE 1 TABLET EACH DAY. What changed: See the new instructions.   mirtazapine 15 MG tablet Commonly known as: REMERON Take 15 mg by mouth at bedtime.   ondansetron 4 MG tablet Commonly known as: ZOFRAN Take 1 tablet by mouth as needed.   potassium chloride 10 MEQ tablet Commonly known as: KLOR-CON Take 10 mEq by mouth. Mon, Wed, Frid   Reclast 5 MG/100ML Soln injection Generic drug: zoledronic acid Inject 5 mg into the vein yearly. (Start June 2015)   Rocklatan 0.02-0.005 % Soln Generic drug: Netarsudil-Latanoprost Place 1 drop into both eyes at bedtime.   simethicone 80 MG chewable tablet Commonly known as: MYLICON Chew 1 tablet (80 mg total) by mouth every 4 (four) hours as needed for flatulence (also available OTC).       Past Surgical History:  She  has a past surgical history that includes Colonoscopy (2008); Esophagogastroduodenoscopy (2008); Lumbar fusion (2008); Knee Arthroplasty (2004); Cholecystectomy (2000); Rotator cuff repair (2000); Breast biopsy (1988); Cesarean section (1974); Appendectomy (1974); Tubal ligation (1974); Cesarean section (1971); Parathyroidectomy (2009); Eye surgery; Parathyroidectomy (N/A, 01/04/2017); Upper gastrointestinal endoscopy; Esophagogastroduodenoscopy (egd) with propofol (N/A, 05/03/2019); biopsy (05/03/2019); Gastrojejunostomy (N/A, 05/06/2019); and IR GJ Tube Change (06/03/2019).  Family History:  Her family history includes Colon polyps in her mother; Diabetes in her father; Hernia in her father; Hypertension in her mother; Liver disease in her paternal grandmother; Osteoarthritis in her maternal grandmother and mother; Stroke in her father, maternal grandfather, and paternal grandfather.  Social History:  She  reports that she quit smoking about 4 years ago. Her smoking use included cigarettes. She has a 3.75 pack-year smoking history. She has never used smokeless tobacco. She reports current alcohol use of about 1.0  standard drinks of alcohol per week. She reports that she does not use drugs.

## 2019-08-21 NOTE — Patient Instructions (Signed)
Will have [facility] refit your CPAP mask or try you on a chin strap with your current mask  Follow up in 1 year

## 2019-09-02 ENCOUNTER — Ambulatory Visit: Payer: Medicare Other

## 2019-09-04 ENCOUNTER — Other Ambulatory Visit: Payer: Self-pay

## 2019-09-04 ENCOUNTER — Emergency Department (HOSPITAL_COMMUNITY)
Admission: EM | Admit: 2019-09-04 | Discharge: 2019-09-05 | Disposition: A | Discharge status: Home / Self Care | Payer: Medicare Other | Attending: Emergency Medicine | Admitting: Emergency Medicine

## 2019-09-04 ENCOUNTER — Emergency Department (HOSPITAL_COMMUNITY): Payer: Medicare Other

## 2019-09-04 ENCOUNTER — Encounter (HOSPITAL_COMMUNITY): Payer: Self-pay | Admitting: Emergency Medicine

## 2019-09-04 DIAGNOSIS — E119 Type 2 diabetes mellitus without complications: Secondary | ICD-10-CM | POA: Insufficient documentation

## 2019-09-04 DIAGNOSIS — I10 Essential (primary) hypertension: Secondary | ICD-10-CM | POA: Diagnosis not present

## 2019-09-04 DIAGNOSIS — Z20828 Contact with and (suspected) exposure to other viral communicable diseases: Secondary | ICD-10-CM | POA: Diagnosis not present

## 2019-09-04 DIAGNOSIS — Z87891 Personal history of nicotine dependence: Secondary | ICD-10-CM | POA: Diagnosis not present

## 2019-09-04 DIAGNOSIS — R0602 Shortness of breath: Secondary | ICD-10-CM

## 2019-09-04 DIAGNOSIS — B349 Viral infection, unspecified: Secondary | ICD-10-CM | POA: Insufficient documentation

## 2019-09-04 DIAGNOSIS — R05 Cough: Secondary | ICD-10-CM | POA: Diagnosis present

## 2019-09-04 LAB — CBC
HCT: 41.6 % (ref 36.0–46.0)
Hemoglobin: 13.9 g/dL (ref 12.0–15.0)
MCH: 30.7 pg (ref 26.0–34.0)
MCHC: 33.4 g/dL (ref 30.0–36.0)
MCV: 91.8 fL (ref 80.0–100.0)
Platelets: 326 10*3/uL (ref 150–400)
RBC: 4.53 MIL/uL (ref 3.87–5.11)
RDW: 13.8 % (ref 11.5–15.5)
WBC: 7.1 10*3/uL (ref 4.0–10.5)
nRBC: 0 % (ref 0.0–0.2)

## 2019-09-04 LAB — COMPREHENSIVE METABOLIC PANEL
ALT: 19 U/L (ref 0–44)
AST: 38 U/L (ref 15–41)
Albumin: 2.7 g/dL — ABNORMAL LOW (ref 3.5–5.0)
Alkaline Phosphatase: 153 U/L — ABNORMAL HIGH (ref 38–126)
Anion gap: 11 (ref 5–15)
BUN: 16 mg/dL (ref 8–23)
CO2: 23 mmol/L (ref 22–32)
Calcium: 10 mg/dL (ref 8.9–10.3)
Chloride: 104 mmol/L (ref 98–111)
Creatinine, Ser: 1.11 mg/dL — ABNORMAL HIGH (ref 0.44–1.00)
GFR calc Af Amer: 54 mL/min — ABNORMAL LOW (ref >60)
GFR calc non Af Amer: 47 mL/min — ABNORMAL LOW (ref >60)
Glucose, Bld: 115 mg/dL — ABNORMAL HIGH (ref 70–99)
Potassium: 4 mmol/L (ref 3.5–5.1)
Sodium: 138 mmol/L (ref 135–145)
Total Bilirubin: 0.9 mg/dL (ref 0.3–1.2)
Total Protein: 6 g/dL — ABNORMAL LOW (ref 6.5–8.1)

## 2019-09-04 LAB — POC SARS CORONAVIRUS 2 AG -  ED: SARS Coronavirus 2 Ag: NEGATIVE

## 2019-09-04 LAB — BRAIN NATRIURETIC PEPTIDE: B Natriuretic Peptide: 87.7 pg/mL (ref 0.0–100.0)

## 2019-09-04 MED ORDER — DEXAMETHASONE SODIUM PHOSPHATE 10 MG/ML IJ SOLN
6.0000 mg | Freq: Once | INTRAMUSCULAR | Status: AC
  Administered 2019-09-04: 6 mg via INTRAVENOUS
  Filled 2019-09-04: qty 1

## 2019-09-04 NOTE — ED Notes (Signed)
Calling ptar  Paperwork complete

## 2019-09-04 NOTE — ED Triage Notes (Signed)
Per EMS-states patient's husband is positive and they have been self quarantining together at home-states she has been tested 3 times and has not had a positive result although SOB, weak and SOB with cough-husband call her PCP and stated his wife's O2 was low so directed them to ED

## 2019-09-04 NOTE — ED Provider Notes (Signed)
[facility] Emergency Department Provider Note MRN:  [id number]  Arrival date & time: 09/04/19     Chief Complaint   Cough, Shortness of Breath, and Weakness   History of Present Illness   [name] is a 84 y.o. year-old female with a history of bronchiectasis, diabetes presenting to the ED with chief complaint of cough, shortness of breath.  Patient has been feeling ill for 1 week.  General malaise, body aches, dull frontal headache, cough, fever, more recently with shortness of breath.  Living with her husband who has tested positive for Covid.  She has tested negative for Covid multiple times.  But she is feeling more sick now.  Denies chest pain, no abdominal pain, nausea but no vomiting, no diarrhea.  Review of Systems  A complete 10 system review of systems was obtained and all systems are negative except as noted in the HPI and PMH.   Patient's Health History    Past Medical History:  Diagnosis Date  . Allergy   . Anemia   . Anxiety   . Bronchiectasis ([place]) 10/01/2015  . Cataract    removed both eyes  . Depression   . Diabetes mellitus without complication ([place])    diet controlled DM- no meds   . Diverticulosis of colon (without mention of hemorrhage) 07/17/2007  . Gastritis 07/17/2007  . GERD (gastroesophageal reflux disease)   . Glaucoma   . History of fatty infiltration of liver   . Hyperlipemia   . Hyperparathyroidism ([place])   . Hyperparathyroidism, primary ([place]) 01/01/2017  . Hypertension    under control   . Lumbosacral root lesions, not elsewhere classified 04/13/2014  . Obese   . Osteoarthritis   . Osteoporosis   . PONV (postoperative nausea and vomiting)    Not in last several surgeries  . Renal calculus, right    severe  . Sleep apnea    uses cpap at night   . Thyroid disease     Past Surgical History:  Procedure Laterality Date  . APPENDECTOMY  1974  . BIOPSY  05/03/2019   Procedure: BIOPSY;  Surgeon: Rush Landmark  Telford Nab., MD;  Location: [facility];  Service: Gastroenterology;;  . [place]   LEFT--BENIGN   . CESAREAN SECTION  1974  . CESAREAN SECTION  1971  . CHOLECYSTECTOMY  2000  . COLONOSCOPY  2008  . ESOPHAGOGASTRODUODENOSCOPY  2008  . ESOPHAGOGASTRODUODENOSCOPY (EGD) WITH PROPOFOL N/A 05/03/2019   Procedure: ESOPHAGOGASTRODUODENOSCOPY (EGD) WITH PROPOFOL;  Surgeon: Rush Landmark Telford Nab., MD;  Location: [facility];  Service: Gastroenterology;  Laterality: N/A;  . EYE SURGERY     Lasik, Cataract, Hx of Glaucoma  . GASTROJEJUNOSTOMY N/A 05/06/2019   Procedure: LAPAROSCOPIC GASTROJEJUNOSTOMY and feedling gastrojejunostomy tube;  Surgeon: Onedia Vargus Boston, MD;  Location: [facility];  Service: General;  Laterality: N/A;  . IR [place] TUBE CHANGE  06/03/2019  . KNEE ARTHROPLASTY  2004   RIGHT-TOTAL   . LUMBAR FUSION  2008  . PARATHYROIDECTOMY  2009   X 2--RIGHT INFERIOR AND LEFT SUPERIOR  . PARATHYROIDECTOMY N/A 01/04/2017   Procedure: RIGHT NECK EXPLORATION WITH PARATHYROIDECTOMY;  Surgeon: Armandina Gemma, MD;  Location: [facility];  Service: General;  Laterality: N/A;  . ROTATOR CUFF REPAIR  2000   RIGHT  . TUBAL LIGATION  1974  . UPPER GASTROINTESTINAL ENDOSCOPY      Family History  Problem Relation Age of Onset  . Colon polyps Mother   . Hypertension Mother   . Osteoarthritis  Mother   . Diabetes Father   . Stroke Father   . Hernia Father        Esophageal hernia  . Osteoarthritis Maternal Grandmother   . Stroke Maternal Grandfather   . Liver disease Paternal Grandmother   . Stroke Paternal Grandfather   . Colon cancer Neg Hx   . Esophageal cancer Neg Hx   . Rectal cancer Neg Hx   . Stomach cancer Neg Hx     Social History   Socioeconomic History  . Marital status: Married    Spouse name: Not on file  . Number of children: 2  . Years of education: Not on file  . Highest education level: Not on file  Occupational History  . Occupation: retired  Scientific laboratory technician  . Financial  resource strain: Not on file  . Food insecurity    Worry: Not on file    Inability: Not on file  . Transportation needs    Medical: Not on file    Non-medical: Not on file  Tobacco Use  . Smoking status: Former Smoker    Packs/day: 0.25    Years: 15.00    Pack years: 3.75    Types: Cigarettes    Quit date: 09/01/2014    Years since quitting: 5.0  . Smokeless tobacco: Never Used  Substance and Sexual Activity  . Alcohol use: Yes    Alcohol/week: 1.0 standard drinks    Types: 1 Glasses of wine per week    Comment: occasional  . Drug use: No  . Sexual activity: Not on file  Lifestyle  . Physical activity    Days per week: Not on file    Minutes per session: Not on file  . Stress: Not on file  Relationships  . Social Herbalist on phone: Not on file    Gets together: Not on file    Attends religious service: Not on file    Active member of club or organization: Not on file    Attends meetings of clubs or organizations: Not on file    Relationship status: Not on file  . Intimate partner violence    Fear of current or ex partner: Not on file    Emotionally abused: Not on file    Physically abused: Not on file    Forced sexual activity: Not on file  Other Topics Concern  . Not on file  Social History Narrative   Married, with 2 children   Right handed   Some college   No caffeine      Epworth Sleepiness Scale = 7 (as of 07/21/2015)        Physical Exam  Vital Signs and Nursing Notes reviewed Vitals:   09/04/19 1900 09/04/19 2237  BP:  (!) 125/55  Pulse: 72 72  Resp: 19 (!) 21  Temp:  98.9 F (37.2 C)  SpO2: 93% 95%    CONSTITUTIONAL: Tired-appearing, NAD NEURO:  Alert and oriented x 3, no focal deficits EYES:  eyes equal and reactive ENT/NECK:  no LAD, no JVD CARDIO: Regular rate, well-perfused, normal S1 and S2 PULM:  CTAB no wheezing or rhonchi GI/GU:  normal bowel sounds, non-distended, non-tender MSK/SPINE:  No gross deformities, no edema  SKIN:  no rash, atraumatic PSYCH:  Appropriate speech and behavior  Diagnostic and Interventional Summary    EKG Interpretation  Date/Time:  Thursday September 04 2019 17:36:50 EST Ventricular Rate:  71 PR Interval:    QRS Duration: 84 QT Interval:  378 QTC Calculation: 411 R Axis:   -26 Text Interpretation: Sinus rhythm Borderline left axis deviation Low voltage, precordial leads No significant change was found Confirmed by Gerlene Fee [phone]) on 09/04/2019 7:43:06 PM      Labs Reviewed  COMPREHENSIVE METABOLIC PANEL - Abnormal; Notable for the following components:      Result Value   Glucose, Bld 115 (*)    Creatinine, Ser 1.11 (*)    Total Protein 6.0 (*)    Albumin 2.7 (*)    Alkaline Phosphatase 153 (*)    GFR calc non Af Amer 47 (*)    GFR calc Af Amer 54 (*)    All other components within normal limits  CBC  BRAIN NATRIURETIC PEPTIDE  POC SARS CORONAVIRUS 2 AG -  ED    XR Chest Single View  Final Result      Medications  dexamethasone (DECADRON) injection 6 mg (6 mg Intravenous Given 09/04/19 1726)     Procedures  /  Critical Care Procedures  ED Course and Medical Decision Making  I have reviewed the triage vital signs and the nursing notes.  Pertinent labs & imaging results that were available during my care of the patient were reviewed by me and considered in my medical decision making (see below for details).     Suspect viral illness, possibly COVID-19 given her exposure.  Benign abdomen, no evidence of DVT on exam, she appears tired and looks as if she feels unwell but there is no increased work of breathing.  While in the room I observed a pulse ox ranging between 90 and 96.  We will monitor this closely and will ambulate with pulse ox to determine appropriateness of discharge.  Work-up is reassuring, patient ambulated well without desaturations, she is feeling well, appropriate for discharge.  Advised to continue home quarantine.  Barth Kirks. Sedonia Small,  MD [place] [email or web address]  Final Clinical Impressions(s) / ED Diagnoses     ICD-10-CM   1. Viral illness  B34.9   2. SOB (shortness of breath)  R06.02 XR Chest Single View    XR Chest Single View    ED Discharge Orders    None       Discharge Instructions Discussed with and Provided to Patient:     Discharge Instructions     You were evaluated in the Emergency Department and after careful evaluation, we did not find any emergent condition requiring admission or further testing in the hospital.  Your exam/testing today was overall reassuring.  Please return to the Emergency Department if you experience any worsening of your condition.  We encourage you to follow up with a primary care provider.  Thank you for allowing [place] to be a part of your care.        Maudie Flakes, MD 09/04/19 2252

## 2019-09-04 NOTE — ED Notes (Signed)
Pt ambulated with this Probation officer around room. Pt 02 stats stayed at 96% and above. Pt denies any feelings of shortness of breath or dizziness while ambulating.

## 2019-09-04 NOTE — Discharge Instructions (Addendum)
You were evaluated in the Emergency Department and after careful evaluation, we did not find any emergent condition requiring admission or further testing in the hospital. ° °Your exam/testing today was overall reassuring. ° °Please return to the Emergency Department if you experience any worsening of your condition.  We encourage you to follow up with a primary care provider.  Thank you for allowing us to be a part of your care. ° °

## 2019-10-27 ENCOUNTER — Telehealth: Payer: Self-pay | Admitting: Gastroenterology

## 2019-10-27 NOTE — Telephone Encounter (Signed)
Spoke with Anda Kraft, NP She did want to let you know the patient was tested for COVID19 multiple times. All negative.  LFT normal 10/07/19 AST elevated 10/16/19 Now the elevated LFT's that she called to here. She will order the abdominal u/s. She wanted to be certain no MRCP or GI urgent appointment.

## 2019-10-27 NOTE — Telephone Encounter (Signed)
Ok, I know she tested negative for Covid but her presentation and symptoms are suspicious of post viral syndrome. Can you bring her in for a appointment with extender this week ?  Follow up RUQ ultrasound, we can decide further work up based on that

## 2019-10-27 NOTE — Telephone Encounter (Signed)
Please ask them to advise patient to avoid NSAID's, any OTC meds or ETOH Obtain abdominal ultrasound.  Her Husband had Covid 61 and she also had Covid like symptoms even though she tested negative, possible post viral hepatic injury.  Recheck LFT in 1 week

## 2019-10-28 ENCOUNTER — Other Ambulatory Visit: Payer: Self-pay | Admitting: Internal Medicine

## 2019-10-28 DIAGNOSIS — R7989 Other specified abnormal findings of blood chemistry: Secondary | ICD-10-CM

## 2019-10-28 NOTE — Telephone Encounter (Signed)
PCP has scheduled the abdominal u/s for 10/30/19. Called [name]. Offered an appointment tomorrow at 8:50 am with Dr Silverio Decamp. Declined. Unable to come in tomorrow due to other obligations. Appointment moved to Friday 10/31/19.

## 2019-10-29 ENCOUNTER — Ambulatory Visit: Payer: Medicare Other | Admitting: Gastroenterology

## 2019-10-30 ENCOUNTER — Ambulatory Visit
Admission: RE | Admit: 2019-10-30 | Discharge: 2019-10-30 | Disposition: A | Discharge status: Home / Self Care | Payer: Medicare PPO | Source: Ambulatory Visit | Attending: Internal Medicine | Admitting: Internal Medicine

## 2019-10-30 ENCOUNTER — Other Ambulatory Visit: Payer: Self-pay

## 2019-10-30 DIAGNOSIS — R7989 Other specified abnormal findings of blood chemistry: Secondary | ICD-10-CM

## 2019-10-31 ENCOUNTER — Ambulatory Visit: Payer: Medicare Other | Admitting: Gastroenterology

## 2019-10-31 ENCOUNTER — Ambulatory Visit (INDEPENDENT_AMBULATORY_CARE_PROVIDER_SITE_OTHER): Payer: Medicare PPO | Admitting: Gastroenterology

## 2019-10-31 ENCOUNTER — Encounter: Payer: Self-pay | Admitting: Gastroenterology

## 2019-10-31 DIAGNOSIS — R945 Abnormal results of liver function studies: Secondary | ICD-10-CM

## 2019-10-31 DIAGNOSIS — K838 Other specified diseases of biliary tract: Secondary | ICD-10-CM

## 2019-10-31 DIAGNOSIS — R932 Abnormal findings on diagnostic imaging of liver and biliary tract: Secondary | ICD-10-CM | POA: Diagnosis not present

## 2019-10-31 DIAGNOSIS — Z9049 Acquired absence of other specified parts of digestive tract: Secondary | ICD-10-CM

## 2019-10-31 DIAGNOSIS — R7989 Other specified abnormal findings of blood chemistry: Secondary | ICD-10-CM

## 2019-10-31 NOTE — Progress Notes (Signed)
[name]    [id number]    10-29-1934  Primary Care Physician:Aronson, Delfino Lovett, MD  Referring Physician: Burnard Bunting, MD [address] [place],  [place] [address]  This service was provided via  telemedicine due to [place] 19 pandemic.  I connected with@ on 10/31/19 at  3:00 PM EST by a video enabled telemedicine application and verified that I am speaking with the correct person using two identifiers.  Patient location: Home Provider location: Office   I discussed the limitations, risks, security and privacy concerns of performing an evaluation and management service by video enabled telemedicine application and the availability of in person appointments. I also discussed with the patient that there may be a patient responsible charge related to this service. The patient expressed understanding and agreed to proceed.   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  She was having similar symptoms with shortness of breath, cough and fatigue.  She has tested negative for COVID-19.  She was diagnosed with pneumonia last week and is currently on antibiotics completing 10-day course Most recent labs from this past week with AST 323, ALT 143 and alkaline phosphatase 954.  Bilirubin within normal range She did have mild elevation in alkaline phosphatase to 153 in December 2020 when she was in the ER.  Denies any fever, abdominal pain, nausea, vomiting, change in bowel habits melena or blood per rectum.  Abdominal ultrasound 10/30/2019 showed dilated CBD to 2.7 cm tapering to 12 mm near the pancreatic head, significant increase in size compared to prior CT in July  2020.  She also has intrahepatic duct dilation.  No mass or focal lesion.  Pancreatic duct is also dilated to 4 mm  Outpatient Encounter Medications as of 10/31/2019  Medication Sig  . benzonatate (TESSALON) 100 MG capsule Take 100 mg by mouth 3 (three) times daily as needed for cough.   . dorzolamide (TRUSOPT) 2 % ophthalmic solution Place 1 drop into both eyes 2 (two) times daily.   . fluticasone (FLONASE) 50 MCG/ACT nasal spray Place 2 sprays into both nostrils daily. (Patient taking differently: Place 2 sprays into both nostrils at bedtime as needed for allergies. )  . furosemide (LASIX) 20 MG tablet Take 20 mg by mouth. Mon, Wed, Frid  . levothyroxine (SYNTHROID) 50 MCG tablet Take 50 mcg by mouth daily.  Marland Kitchen losartan (COZAAR) 100 MG tablet TAKE 1 TABLET EACH DAY. (Patient taking differently: Take 100 mg by mouth daily. )  . mirtazapine (REMERON) 15 MG tablet Take 15 mg by mouth at bedtime.  . ondansetron (ZOFRAN) 4 MG tablet Take 1 tablet by mouth every 8 (eight) hours as needed for nausea or vomiting.   . potassium chloride (KLOR-CON) 10 MEQ tablet Take 10 mEq by mouth. Mon, Wed, Frid  . ROCKLATAN 0.02-0.005 % SOLN Place 1 drop into both eyes at bedtime.  . simethicone (MYLICON) 80 MG chewable tablet Chew 1 tablet (80 mg total) by mouth every 4 (four) hours as needed for flatulence (also available OTC).  Marland Kitchen zoledronic acid (RECLAST) 5 MG/100ML SOLN injection Inject 5 mg into the vein yearly. (Start June 2015)   No facility-administered encounter medications on file as  of 10/31/2019.    Allergies as of 10/31/2019 - Review Complete 09/04/2019  Allergen Reaction Noted  . Codeine Nausea And Vomiting 03/17/2014  . Contrast media [iodinated diagnostic agents] Hives, Shortness Of Breath, and Itching 05/16/2018  . Iohexol Hives and Other (See Comments) 02/21/2007  . Aspirin  06/30/2016  . Fentanyl  10/20/2016    Past Medical History:  Diagnosis Date  . Allergy   . Anemia   . Anxiety   .  Bronchiectasis ([place]) 10/01/2015  . Cataract    removed both eyes  . Depression   . Diabetes mellitus without complication ([place])    diet controlled DM- no meds   . Diverticulosis of colon (without mention of hemorrhage) 07/17/2007  . Gastritis 07/17/2007  . GERD (gastroesophageal reflux disease)   . Glaucoma   . History of fatty infiltration of liver   . Hyperlipemia   . Hyperparathyroidism ([place])   . Hyperparathyroidism, primary ([place]) 01/01/2017  . Hypertension    under control   . Lumbosacral root lesions, not elsewhere classified 04/13/2014  . Obese   . Osteoarthritis   . Osteoporosis   . PONV (postoperative nausea and vomiting)    Not in last several surgeries  . Renal calculus, right    severe  . Sleep apnea    uses cpap at night   . Thyroid disease     Past Surgical History:  Procedure Laterality Date  . APPENDECTOMY  1974  . BIOPSY  05/03/2019   Procedure: BIOPSY;  Surgeon: Rush Landmark Telford Nab., MD;  Location: [facility];  Service: Gastroenterology;;  . [place]   LEFT--BENIGN   . CESAREAN SECTION  1974  . CESAREAN SECTION  1971  . CHOLECYSTECTOMY  2000  . COLONOSCOPY  2008  . ESOPHAGOGASTRODUODENOSCOPY  2008  . ESOPHAGOGASTRODUODENOSCOPY (EGD) WITH PROPOFOL N/A 05/03/2019   Procedure: ESOPHAGOGASTRODUODENOSCOPY (EGD) WITH PROPOFOL;  Surgeon: Rush Landmark Telford Nab., MD;  [facility];  Service: Gastroenterology;  Laterality: N/A;  . EYE SURGERY     Lasik, Cataract, Hx of Glaucoma  . GASTROJEJUNOSTOMY N/A 05/06/2019   Procedure: LAPAROSCOPIC GASTROJEJUNOSTOMY and feedling gastrojejunostomy tube;  Surgeon: Michael Boston, MD;  Location: [facility];  Service: General;  Laterality: N/A;  . IR [place] TUBE CHANGE  06/03/2019  . KNEE ARTHROPLASTY  2004   RIGHT-TOTAL   . LUMBAR FUSION  2008  . PARATHYROIDECTOMY  2009   X 2--RIGHT INFERIOR AND LEFT SUPERIOR  . PARATHYROIDECTOMY N/A 01/04/2017   Procedure: RIGHT NECK EXPLORATION WITH PARATHYROIDECTOMY;  Surgeon:  Armandina Gemma, MD;  Location: [facility];  Service: General;  Laterality: N/A;  . ROTATOR CUFF REPAIR  2000   RIGHT  . TUBAL LIGATION  1974  . UPPER GASTROINTESTINAL ENDOSCOPY      Family History  Problem Relation Age of Onset  . Colon polyps Mother   . Hypertension Mother   . Osteoarthritis Mother   . Diabetes Father   . Stroke Father   . Hernia Father        Esophageal hernia  . Osteoarthritis Maternal Grandmother   . Stroke Maternal Grandfather   . Liver disease Paternal Grandmother   . Stroke Paternal Grandfather   . Colon cancer Neg Hx   . Esophageal cancer Neg Hx   . Rectal cancer Neg Hx   . Stomach cancer Neg Hx     Social History   Socioeconomic History  . Marital status: Married    Spouse name: Not on file  . Number of children: 2  .  Years of education: Not on file  . Highest education level: Not on file  Occupational History  . Occupation: retired  Tobacco Use  . Smoking status: Former Smoker    Packs/day: 0.25    Years: 15.00    Pack years: 3.75    Types: Cigarettes    Quit date: 09/01/2014    Years since quitting: 5.1  . Smokeless tobacco: Never Used  Substance and Sexual Activity  . Alcohol use: Yes    Alcohol/week: 1.0 standard drinks    Types: 1 Glasses of wine per week    Comment: occasional  . Drug use: No  . Sexual activity: Not on file  Other Topics Concern  . Not on file  Social History Narrative   Married, with 2 children   Right handed   Some college   No caffeine      Epworth Sleepiness Scale = 7 (as of 07/21/2015)      Social Determinants of Health   Financial Resource Strain:   . Difficulty of Paying Living Expenses: Not on file  Food Insecurity:   . Worried About Charity fundraiser in the Last Year: Not on file  . Ran Out of Food in the Last Year: Not on file  Transportation Needs:   . Lack of Transportation (Medical): Not on file  . Lack of Transportation (Non-Medical): Not on file  Physical Activity:   . Days of Exercise  per Week: Not on file  . Minutes of Exercise per Session: Not on file  Stress:   . Feeling of Stress : Not on file  Social Connections:   . Frequency of Communication with Friends and Family: Not on file  . Frequency of Social Gatherings with Friends and Family: Not on file  . Attends Religious Services: Not on file  . Active Member of Clubs or Organizations: Not on file  . Attends Archivist Meetings: Not on file  . Marital Status: Not on file  Intimate Partner Violence:   . Fear of Current or Ex-Partner: Not on file  . Emotionally Abused: Not on file  . Physically Abused: Not on file  . Sexually Abused: Not on file      Review of systems: Review of Systems as per HPI All other systems reviewed and are negative.   Observations/Objective:   Data Reviewed:  Reviewed labs, radiology imaging, old records and pertinent past GI work up   Assessment and Plan/Recommendations:  85 year old female with history of gastric outlet obstruction due to benign duodenal stenosis s/p gastrojejunostomy. History of exposure to COVID-19 (husband developed [id number] last month complicated by pneumonia and stroke).  She has had negative COVID-19 test X2 Currently on antibiotics for pneumonia  Abnormal LFTs with elevated transaminases and alkaline phosphatase Abdominal ultrasound with intrahepatic and extrahepatic duct dilation,, will schedule for MRCP for better evaluation and exclude pancreatic head neoplasm.  Other differential includes distal CBD sludge or stone.  She is s/p cholecystectomy  ERCP can be challenging given her anatomy with gastrojejunostomy, plan for further evaluation based on MRCP findings  Follow-up CBC, CMP, lipase, PT and INR Schedule MRCP soon  Advised patient to come to ER if she develops fever, nausea, vomiting or abdominal pain.  Will need further evaluation to exclude ascending cholangitis    I discussed the assessment and treatment plan with the  patient. The patient was provided an opportunity to ask questions and all were answered. The patient agreed with the plan and demonstrated an understanding of  the instructions.   The patient was advised to call back or seek an in-person evaluation if the symptoms worsen or if the condition fails to improve as anticipated.  I provided 30 minutes of non-face-to-face time during this encounter.   Harl Bowie, MD   CC: Burnard Bunting, MD

## 2019-10-31 NOTE — Addendum Note (Signed)
Addended byDebbe Mounts on: 10/31/2019 04:11 PM   Modules accepted: Orders

## 2019-10-31 NOTE — Patient Instructions (Addendum)
You have been scheduled for an MRI/MRCP at [facility] on 11/10/2019. Your appointment time is 3:00pm. Please arrive 30 minutes prior to your appointment time for registration purposes. Please make certain not to have anything to eat or drink 4 hours prior to your test. In addition, if you have any metal in your body, have a pacemaker or defibrillator, please be sure to let your ordering physician know. This test typically takes 45 minutes to 1 hour to complete. Should you need to reschedule, please call [phone] to do so.   Your provider has requested that you go to the basement level for lab work on Monday 11/03/2019. Press "B" on the elevator. The lab is located at the first door on the left as you exit the elevator.   Follow up and possible ERCP based on findings of MRCP  Thank you for choosing me and [facility].  Dr. Silverio Decamp

## 2019-11-03 ENCOUNTER — Emergency Department (HOSPITAL_COMMUNITY): Payer: Medicare PPO

## 2019-11-03 ENCOUNTER — Other Ambulatory Visit: Payer: Self-pay

## 2019-11-03 ENCOUNTER — Emergency Department (HOSPITAL_COMMUNITY)
Admission: EM | Admit: 2019-11-03 | Discharge: 2019-11-03 | Discharge status: Against Medical Advice | Payer: Medicare PPO | Source: Home / Self Care

## 2019-11-03 ENCOUNTER — Encounter (HOSPITAL_COMMUNITY): Payer: Self-pay

## 2019-11-03 ENCOUNTER — Other Ambulatory Visit (INDEPENDENT_AMBULATORY_CARE_PROVIDER_SITE_OTHER): Payer: Medicare PPO

## 2019-11-03 DIAGNOSIS — R7989 Other specified abnormal findings of blood chemistry: Secondary | ICD-10-CM

## 2019-11-03 DIAGNOSIS — Z9049 Acquired absence of other specified parts of digestive tract: Secondary | ICD-10-CM

## 2019-11-03 DIAGNOSIS — K838 Other specified diseases of biliary tract: Secondary | ICD-10-CM | POA: Diagnosis not present

## 2019-11-03 DIAGNOSIS — C259 Malignant neoplasm of pancreas, unspecified: Secondary | ICD-10-CM | POA: Diagnosis not present

## 2019-11-03 DIAGNOSIS — R932 Abnormal findings on diagnostic imaging of liver and biliary tract: Secondary | ICD-10-CM

## 2019-11-03 DIAGNOSIS — R945 Abnormal results of liver function studies: Secondary | ICD-10-CM | POA: Diagnosis not present

## 2019-11-03 DIAGNOSIS — R17 Unspecified jaundice: Secondary | ICD-10-CM | POA: Diagnosis not present

## 2019-11-03 LAB — CBC
HCT: 35.1 % — ABNORMAL LOW (ref 36.0–46.0)
Hemoglobin: 11 g/dL — ABNORMAL LOW (ref 12.0–15.0)
MCH: 29.9 pg (ref 26.0–34.0)
MCHC: 31.3 g/dL (ref 30.0–36.0)
MCV: 95.4 fL (ref 80.0–100.0)
Platelets: 358 10*3/uL (ref 150–400)
RBC: 3.68 MIL/uL — ABNORMAL LOW (ref 3.87–5.11)
RDW: 16.5 % — ABNORMAL HIGH (ref 11.5–15.5)
WBC: 6 10*3/uL (ref 4.0–10.5)
nRBC: 0 % (ref 0.0–0.2)

## 2019-11-03 LAB — CBC WITH DIFFERENTIAL/PLATELET
Basophils Absolute: 0 10*3/uL (ref 0.0–0.1)
Basophils Relative: 0.8 % (ref 0.0–3.0)
Eosinophils Absolute: 0.4 10*3/uL (ref 0.0–0.7)
Eosinophils Relative: 7.7 % — ABNORMAL HIGH (ref 0.0–5.0)
HCT: 33.3 % — ABNORMAL LOW (ref 36.0–46.0)
Hemoglobin: 11.1 g/dL — ABNORMAL LOW (ref 12.0–15.0)
Lymphocytes Relative: 14.3 % (ref 12.0–46.0)
Lymphs Abs: 0.8 10*3/uL (ref 0.7–4.0)
MCHC: 33.5 g/dL (ref 30.0–36.0)
MCV: 91.4 fl (ref 78.0–100.0)
Monocytes Absolute: 0.5 10*3/uL (ref 0.1–1.0)
Monocytes Relative: 9.8 % (ref 3.0–12.0)
Neutro Abs: 3.7 10*3/uL (ref 1.4–7.7)
Neutrophils Relative %: 67.4 % (ref 43.0–77.0)
Platelets: 329 10*3/uL (ref 150.0–400.0)
RBC: 3.64 Mil/uL — ABNORMAL LOW (ref 3.87–5.11)
RDW: 17.1 % — ABNORMAL HIGH (ref 11.5–15.5)
WBC: 5.5 10*3/uL (ref 4.0–10.5)

## 2019-11-03 LAB — COMPREHENSIVE METABOLIC PANEL
ALT: 111 U/L — ABNORMAL HIGH (ref 0–35)
ALT: 124 U/L — ABNORMAL HIGH (ref 0–44)
AST: 272 U/L — ABNORMAL HIGH (ref 0–37)
AST: 319 U/L — ABNORMAL HIGH (ref 15–41)
Albumin: 2.6 g/dL — ABNORMAL LOW (ref 3.5–5.2)
Albumin: 2.2 g/dL — ABNORMAL LOW (ref 3.5–5.0)
Alkaline Phosphatase: 1220 U/L — ABNORMAL HIGH (ref 39–117)
Alkaline Phosphatase: 1110 U/L — ABNORMAL HIGH (ref 38–126)
Anion gap: 9 (ref 5–15)
BUN: 12 mg/dL (ref 6–23)
BUN: 11 mg/dL (ref 8–23)
CO2: 22 mEq/L (ref 19–32)
CO2: 21 mmol/L — ABNORMAL LOW (ref 22–32)
Calcium: 9.7 mg/dL (ref 8.4–10.5)
Calcium: 9.8 mg/dL (ref 8.9–10.3)
Chloride: 107 mEq/L (ref 96–112)
Chloride: 105 mmol/L (ref 98–111)
Creatinine, Ser: 1.1 mg/dL (ref 0.40–1.20)
Creatinine, Ser: 1.29 mg/dL — ABNORMAL HIGH (ref 0.44–1.00)
GFR calc Af Amer: 45 mL/min — ABNORMAL LOW (ref >60)
GFR calc non Af Amer: 39 mL/min — ABNORMAL LOW (ref >60)
GFR: 47.54 mL/min — ABNORMAL LOW (ref >60.00)
Glucose, Bld: 122 mg/dL — ABNORMAL HIGH (ref 70–99)
Glucose, Bld: 92 mg/dL (ref 70–99)
Potassium: 3.9 mEq/L (ref 3.5–5.1)
Potassium: 4.2 mmol/L (ref 3.5–5.1)
Sodium: 137 mEq/L (ref 135–145)
Sodium: 135 mmol/L (ref 135–145)
Total Bilirubin: 4.4 mg/dL — ABNORMAL HIGH (ref 0.2–1.2)
Total Bilirubin: 4.9 mg/dL — ABNORMAL HIGH (ref 0.3–1.2)
Total Protein: 4.9 g/dL — ABNORMAL LOW (ref 6.0–8.3)
Total Protein: 5 g/dL — ABNORMAL LOW (ref 6.5–8.1)

## 2019-11-03 LAB — LIPASE: Lipase: 504 U/L — ABNORMAL HIGH (ref 11.0–59.0)

## 2019-11-03 LAB — PROTIME-INR
INR: 1.4 ratio — ABNORMAL HIGH (ref 0.8–1.0)
Prothrombin Time: 15.5 s — ABNORMAL HIGH (ref 9.6–13.1)

## 2019-11-03 LAB — LIPASE, BLOOD: Lipase: 213 U/L — ABNORMAL HIGH (ref 11–51)

## 2019-11-03 NOTE — ED Triage Notes (Signed)
Pt sent by PCP for evaluation of abnormal liver labs. MRI scheduled for next week but PCP did not want her to wait to be seen, pt is jaundiced, denies any abd pain n/v. Pt has no complaints.

## 2019-11-03 NOTE — ED Notes (Signed)
Pt's visitor took pt home.  Told registration "some doctor told him to come back in am."  RN did not have a chance to talk to pt.

## 2019-11-04 ENCOUNTER — Inpatient Hospital Stay (HOSPITAL_COMMUNITY): Payer: Medicare PPO

## 2019-11-04 ENCOUNTER — Encounter (HOSPITAL_COMMUNITY): Payer: Self-pay | Admitting: Emergency Medicine

## 2019-11-04 ENCOUNTER — Inpatient Hospital Stay (HOSPITAL_COMMUNITY)
Admission: EM | Admit: 2019-11-04 | Discharge: 2019-11-11 | DRG: 981 | Disposition: A | Discharge status: Home Health | Payer: Medicare PPO | Attending: Internal Medicine | Admitting: Internal Medicine

## 2019-11-04 ENCOUNTER — Telehealth: Payer: Self-pay | Admitting: Gastroenterology

## 2019-11-04 ENCOUNTER — Other Ambulatory Visit: Payer: Self-pay

## 2019-11-04 DIAGNOSIS — N1832 Chronic kidney disease, stage 3b: Secondary | ICD-10-CM | POA: Diagnosis present

## 2019-11-04 DIAGNOSIS — J90 Pleural effusion, not elsewhere classified: Secondary | ICD-10-CM | POA: Diagnosis present

## 2019-11-04 DIAGNOSIS — K760 Fatty (change of) liver, not elsewhere classified: Secondary | ICD-10-CM | POA: Diagnosis present

## 2019-11-04 DIAGNOSIS — E039 Hypothyroidism, unspecified: Secondary | ICD-10-CM | POA: Diagnosis present

## 2019-11-04 DIAGNOSIS — R748 Abnormal levels of other serum enzymes: Secondary | ICD-10-CM | POA: Diagnosis not present

## 2019-11-04 DIAGNOSIS — E1151 Type 2 diabetes mellitus with diabetic peripheral angiopathy without gangrene: Secondary | ICD-10-CM | POA: Diagnosis present

## 2019-11-04 DIAGNOSIS — K2950 Unspecified chronic gastritis without bleeding: Secondary | ICD-10-CM | POA: Diagnosis present

## 2019-11-04 DIAGNOSIS — C259 Malignant neoplasm of pancreas, unspecified: Principal | ICD-10-CM | POA: Diagnosis present

## 2019-11-04 DIAGNOSIS — M810 Age-related osteoporosis without current pathological fracture: Secondary | ICD-10-CM | POA: Diagnosis present

## 2019-11-04 DIAGNOSIS — R17 Unspecified jaundice: Secondary | ICD-10-CM | POA: Diagnosis present

## 2019-11-04 DIAGNOSIS — J189 Pneumonia, unspecified organism: Secondary | ICD-10-CM | POA: Diagnosis present

## 2019-11-04 DIAGNOSIS — K838 Other specified diseases of biliary tract: Secondary | ICD-10-CM

## 2019-11-04 DIAGNOSIS — R791 Abnormal coagulation profile: Secondary | ICD-10-CM | POA: Diagnosis present

## 2019-11-04 DIAGNOSIS — Z91041 Radiographic dye allergy status: Secondary | ICD-10-CM

## 2019-11-04 DIAGNOSIS — N179 Acute kidney failure, unspecified: Secondary | ICD-10-CM | POA: Diagnosis present

## 2019-11-04 DIAGNOSIS — I10 Essential (primary) hypertension: Secondary | ICD-10-CM | POA: Diagnosis present

## 2019-11-04 DIAGNOSIS — R935 Abnormal findings on diagnostic imaging of other abdominal regions, including retroperitoneum: Secondary | ICD-10-CM | POA: Diagnosis not present

## 2019-11-04 DIAGNOSIS — C250 Malignant neoplasm of head of pancreas: Secondary | ICD-10-CM | POA: Diagnosis not present

## 2019-11-04 DIAGNOSIS — I129 Hypertensive chronic kidney disease with stage 1 through stage 4 chronic kidney disease, or unspecified chronic kidney disease: Secondary | ICD-10-CM | POA: Diagnosis present

## 2019-11-04 DIAGNOSIS — Z9049 Acquired absence of other specified parts of digestive tract: Secondary | ICD-10-CM

## 2019-11-04 DIAGNOSIS — G893 Neoplasm related pain (acute) (chronic): Secondary | ICD-10-CM | POA: Diagnosis present

## 2019-11-04 DIAGNOSIS — G4730 Sleep apnea, unspecified: Secondary | ICD-10-CM | POA: Diagnosis present

## 2019-11-04 DIAGNOSIS — K831 Obstruction of bile duct: Secondary | ICD-10-CM | POA: Diagnosis present

## 2019-11-04 DIAGNOSIS — Z85820 Personal history of malignant melanoma of skin: Secondary | ICD-10-CM

## 2019-11-04 DIAGNOSIS — E1122 Type 2 diabetes mellitus with diabetic chronic kidney disease: Secondary | ICD-10-CM | POA: Diagnosis present

## 2019-11-04 DIAGNOSIS — E892 Postprocedural hypoparathyroidism: Secondary | ICD-10-CM | POA: Diagnosis present

## 2019-11-04 DIAGNOSIS — D631 Anemia in chronic kidney disease: Secondary | ICD-10-CM | POA: Diagnosis present

## 2019-11-04 DIAGNOSIS — F329 Major depressive disorder, single episode, unspecified: Secondary | ICD-10-CM | POA: Diagnosis present

## 2019-11-04 DIAGNOSIS — Z79890 Hormone replacement therapy (postmenopausal): Secondary | ICD-10-CM

## 2019-11-04 DIAGNOSIS — Z931 Gastrostomy status: Secondary | ICD-10-CM

## 2019-11-04 DIAGNOSIS — K8689 Other specified diseases of pancreas: Secondary | ICD-10-CM

## 2019-11-04 DIAGNOSIS — K851 Biliary acute pancreatitis without necrosis or infection: Secondary | ICD-10-CM | POA: Diagnosis present

## 2019-11-04 DIAGNOSIS — E785 Hyperlipidemia, unspecified: Secondary | ICD-10-CM | POA: Diagnosis present

## 2019-11-04 DIAGNOSIS — E669 Obesity, unspecified: Secondary | ICD-10-CM | POA: Diagnosis present

## 2019-11-04 DIAGNOSIS — K219 Gastro-esophageal reflux disease without esophagitis: Secondary | ICD-10-CM | POA: Diagnosis present

## 2019-11-04 DIAGNOSIS — F419 Anxiety disorder, unspecified: Secondary | ICD-10-CM | POA: Diagnosis present

## 2019-11-04 DIAGNOSIS — R7989 Other specified abnormal findings of blood chemistry: Secondary | ICD-10-CM

## 2019-11-04 DIAGNOSIS — K858 Other acute pancreatitis without necrosis or infection: Secondary | ICD-10-CM | POA: Diagnosis not present

## 2019-11-04 DIAGNOSIS — N183 Chronic kidney disease, stage 3 unspecified: Secondary | ICD-10-CM | POA: Diagnosis present

## 2019-11-04 DIAGNOSIS — Z79899 Other long term (current) drug therapy: Secondary | ICD-10-CM

## 2019-11-04 DIAGNOSIS — Z8249 Family history of ischemic heart disease and other diseases of the circulatory system: Secondary | ICD-10-CM

## 2019-11-04 DIAGNOSIS — K315 Obstruction of duodenum: Secondary | ICD-10-CM | POA: Diagnosis present

## 2019-11-04 DIAGNOSIS — Z981 Arthrodesis status: Secondary | ICD-10-CM

## 2019-11-04 DIAGNOSIS — Z885 Allergy status to narcotic agent status: Secondary | ICD-10-CM

## 2019-11-04 DIAGNOSIS — Z96651 Presence of right artificial knee joint: Secondary | ICD-10-CM | POA: Diagnosis present

## 2019-11-04 DIAGNOSIS — R188 Other ascites: Secondary | ICD-10-CM | POA: Diagnosis present

## 2019-11-04 DIAGNOSIS — Z886 Allergy status to analgesic agent status: Secondary | ICD-10-CM

## 2019-11-04 DIAGNOSIS — Z7289 Other problems related to lifestyle: Secondary | ICD-10-CM

## 2019-11-04 DIAGNOSIS — G609 Hereditary and idiopathic neuropathy, unspecified: Secondary | ICD-10-CM | POA: Diagnosis present

## 2019-11-04 DIAGNOSIS — Z87891 Personal history of nicotine dependence: Secondary | ICD-10-CM

## 2019-11-04 DIAGNOSIS — G894 Chronic pain syndrome: Secondary | ICD-10-CM | POA: Diagnosis present

## 2019-11-04 DIAGNOSIS — Z6833 Body mass index (BMI) 33.0-33.9, adult: Secondary | ICD-10-CM

## 2019-11-04 DIAGNOSIS — G4733 Obstructive sleep apnea (adult) (pediatric): Secondary | ICD-10-CM | POA: Diagnosis present

## 2019-11-04 DIAGNOSIS — N200 Calculus of kidney: Secondary | ICD-10-CM | POA: Diagnosis present

## 2019-11-04 DIAGNOSIS — H409 Unspecified glaucoma: Secondary | ICD-10-CM | POA: Diagnosis present

## 2019-11-04 DIAGNOSIS — Z833 Family history of diabetes mellitus: Secondary | ICD-10-CM

## 2019-11-04 DIAGNOSIS — Z20822 Contact with and (suspected) exposure to covid-19: Secondary | ICD-10-CM | POA: Diagnosis present

## 2019-11-04 DIAGNOSIS — Z87442 Personal history of urinary calculi: Secondary | ICD-10-CM

## 2019-11-04 DIAGNOSIS — Z823 Family history of stroke: Secondary | ICD-10-CM

## 2019-11-04 DIAGNOSIS — K589 Irritable bowel syndrome without diarrhea: Secondary | ICD-10-CM | POA: Diagnosis present

## 2019-11-04 HISTORY — DX: Unspecified jaundice: R17

## 2019-11-04 LAB — HEPATIC FUNCTION PANEL
ALT: 112 U/L — ABNORMAL HIGH (ref 0–44)
AST: 274 U/L — ABNORMAL HIGH (ref 15–41)
Albumin: 2 g/dL — ABNORMAL LOW (ref 3.5–5.0)
Alkaline Phosphatase: 1178 U/L — ABNORMAL HIGH (ref 38–126)
Bilirubin, Direct: 3.6 mg/dL — ABNORMAL HIGH (ref 0.0–0.2)
Indirect Bilirubin: 1.8 mg/dL — ABNORMAL HIGH (ref 0.3–0.9)
Total Bilirubin: 5.4 mg/dL — ABNORMAL HIGH (ref 0.3–1.2)
Total Protein: 5 g/dL — ABNORMAL LOW (ref 6.5–8.1)

## 2019-11-04 LAB — CBC WITH DIFFERENTIAL/PLATELET
Abs Immature Granulocytes: 0.03 10*3/uL (ref 0.00–0.07)
Basophils Absolute: 0 10*3/uL (ref 0.0–0.1)
Basophils Relative: 1 %
Eosinophils Absolute: 0.9 10*3/uL — ABNORMAL HIGH (ref 0.0–0.5)
Eosinophils Relative: 16 %
HCT: 35 % — ABNORMAL LOW (ref 36.0–46.0)
Hemoglobin: 11 g/dL — ABNORMAL LOW (ref 12.0–15.0)
Immature Granulocytes: 1 %
Lymphocytes Relative: 15 %
Lymphs Abs: 0.8 10*3/uL (ref 0.7–4.0)
MCH: 29.6 pg (ref 26.0–34.0)
MCHC: 31.4 g/dL (ref 30.0–36.0)
MCV: 94.3 fL (ref 80.0–100.0)
Monocytes Absolute: 0.7 10*3/uL (ref 0.1–1.0)
Monocytes Relative: 12 %
Neutro Abs: 3.2 10*3/uL (ref 1.7–7.7)
Neutrophils Relative %: 55 %
Platelets: 345 10*3/uL (ref 150–400)
RBC: 3.71 MIL/uL — ABNORMAL LOW (ref 3.87–5.11)
RDW: 16.7 % — ABNORMAL HIGH (ref 11.5–15.5)
WBC: 5.7 10*3/uL (ref 4.0–10.5)
nRBC: 0 % (ref 0.0–0.2)

## 2019-11-04 LAB — PROTIME-INR
INR: 1.2 (ref 0.8–1.2)
Prothrombin Time: 15.4 seconds — ABNORMAL HIGH (ref 11.4–15.2)

## 2019-11-04 LAB — AMMONIA: Ammonia: 50 umol/L — ABNORMAL HIGH (ref 9–35)

## 2019-11-04 LAB — BASIC METABOLIC PANEL
Anion gap: 11 (ref 5–15)
BUN: 11 mg/dL (ref 8–23)
CO2: 20 mmol/L — ABNORMAL LOW (ref 22–32)
Calcium: 10.1 mg/dL (ref 8.9–10.3)
Chloride: 109 mmol/L (ref 98–111)
Creatinine, Ser: 1.26 mg/dL — ABNORMAL HIGH (ref 0.44–1.00)
GFR calc Af Amer: 46 mL/min — ABNORMAL LOW (ref >60)
GFR calc non Af Amer: 40 mL/min — ABNORMAL LOW (ref >60)
Glucose, Bld: 110 mg/dL — ABNORMAL HIGH (ref 70–99)
Potassium: 3.8 mmol/L (ref 3.5–5.1)
Sodium: 140 mmol/L (ref 135–145)

## 2019-11-04 LAB — HEPATITIS PANEL, ACUTE
HCV Ab: NONREACTIVE
Hep A IgM: NONREACTIVE
Hep B C IgM: NONREACTIVE
Hepatitis B Surface Ag: NONREACTIVE

## 2019-11-04 LAB — RESPIRATORY PANEL BY RT PCR (FLU A&B, COVID)
Influenza A by PCR: NEGATIVE
Influenza B by PCR: NEGATIVE
SARS Coronavirus 2 by RT PCR: NEGATIVE

## 2019-11-04 LAB — GLUCOSE, CAPILLARY
Glucose-Capillary: 69 mg/dL — ABNORMAL LOW (ref 70–99)
Glucose-Capillary: 79 mg/dL (ref 70–99)
Glucose-Capillary: 88 mg/dL (ref 70–99)

## 2019-11-04 MED ORDER — ENSURE ENLIVE PO LIQD
237.0000 mL | Freq: Two times a day (BID) | ORAL | Status: DC
  Administered 2019-11-05 – 2019-11-11 (×5): 237 mL via ORAL

## 2019-11-04 MED ORDER — BENZONATATE 100 MG PO CAPS
100.0000 mg | ORAL_CAPSULE | Freq: Three times a day (TID) | ORAL | Status: DC | PRN
  Administered 2019-11-04 – 2019-11-10 (×6): 100 mg via ORAL
  Filled 2019-11-04 (×6): qty 1

## 2019-11-04 MED ORDER — CEFDINIR 300 MG PO CAPS
300.0000 mg | ORAL_CAPSULE | Freq: Two times a day (BID) | ORAL | Status: AC
  Administered 2019-11-04: 22:00:00 300 mg via ORAL
  Filled 2019-11-04: qty 1

## 2019-11-04 MED ORDER — GADOBUTROL 1 MMOL/ML IV SOLN
8.0000 mL | Freq: Once | INTRAVENOUS | Status: AC | PRN
  Administered 2019-11-04: 8 mL via INTRAVENOUS

## 2019-11-04 MED ORDER — ONDANSETRON HCL 4 MG/2ML IJ SOLN
4.0000 mg | Freq: Four times a day (QID) | INTRAMUSCULAR | Status: DC | PRN
  Administered 2019-11-09: 08:00:00 4 mg via INTRAVENOUS
  Filled 2019-11-04: qty 2

## 2019-11-04 MED ORDER — ALBUTEROL SULFATE (2.5 MG/3ML) 0.083% IN NEBU: 2.5000 mg | INHALATION_SOLUTION | Freq: Four times a day (QID) | RESPIRATORY_TRACT | Status: DC | PRN

## 2019-11-04 MED ORDER — PANTOPRAZOLE SODIUM 40 MG PO TBEC
40.0000 mg | DELAYED_RELEASE_TABLET | Freq: Every day | ORAL | Status: DC
  Administered 2019-11-05 – 2019-11-11 (×5): 40 mg via ORAL
  Filled 2019-11-04 (×5): qty 1

## 2019-11-04 MED ORDER — SODIUM CHLORIDE 0.9% FLUSH
3.0000 mL | Freq: Two times a day (BID) | INTRAVENOUS | Status: DC
  Administered 2019-11-04 – 2019-11-11 (×4): 3 mL via INTRAVENOUS

## 2019-11-04 MED ORDER — GABAPENTIN 100 MG PO CAPS
100.0000 mg | ORAL_CAPSULE | Freq: Three times a day (TID) | ORAL | Status: DC
  Administered 2019-11-04 – 2019-11-11 (×17): 100 mg via ORAL
  Filled 2019-11-04 (×17): qty 1

## 2019-11-04 MED ORDER — SODIUM CHLORIDE 0.9 % IV SOLN: INTRAVENOUS | Status: DC

## 2019-11-04 MED ORDER — DORZOLAMIDE HCL 2 % OP SOLN
1.0000 [drp] | Freq: Two times a day (BID) | OPHTHALMIC | Status: DC
  Administered 2019-11-04 – 2019-11-11 (×13): 1 [drp] via OPHTHALMIC
  Filled 2019-11-04: qty 10

## 2019-11-04 MED ORDER — SIMETHICONE 80 MG PO CHEW
80.0000 mg | CHEWABLE_TABLET | ORAL | Status: DC | PRN
  Administered 2019-11-05 – 2019-11-07 (×2): 80 mg via ORAL
  Filled 2019-11-04 (×2): qty 1

## 2019-11-04 MED ORDER — LEVOTHYROXINE SODIUM 50 MCG PO TABS
50.0000 ug | ORAL_TABLET | Freq: Every day | ORAL | Status: DC
  Administered 2019-11-05 – 2019-11-11 (×7): 50 ug via ORAL
  Filled 2019-11-04 (×7): qty 1

## 2019-11-04 MED ORDER — ONDANSETRON HCL 4 MG PO TABS: 4.0000 mg | ORAL_TABLET | Freq: Four times a day (QID) | ORAL | Status: DC | PRN

## 2019-11-04 MED ORDER — MIRTAZAPINE 15 MG PO TABS
15.0000 mg | ORAL_TABLET | Freq: Every day | ORAL | Status: DC
  Administered 2019-11-04 – 2019-11-10 (×7): 15 mg via ORAL
  Filled 2019-11-04 (×7): qty 1

## 2019-11-04 MED ORDER — FLUTICASONE PROPIONATE 50 MCG/ACT NA SUSP
2.0000 | Freq: Every evening | NASAL | Status: DC | PRN
  Filled 2019-11-04: qty 16

## 2019-11-04 MED ORDER — ENOXAPARIN SODIUM 40 MG/0.4ML ~~LOC~~ SOLN: 40.0000 mg | SUBCUTANEOUS | Status: DC

## 2019-11-04 MED ORDER — NETARSUDIL-LATANOPROST 0.02-0.005 % OP SOLN
1.0000 [drp] | Freq: Every day | OPHTHALMIC | Status: DC
  Administered 2019-11-06 – 2019-11-10 (×5): 1 [drp] via OPHTHALMIC
  Filled 2019-11-04: qty 1

## 2019-11-04 NOTE — [facility] Counselor (Signed)
TTS received request of psych eval. It is unclear the purpose of the consult. Contacted patient's RN, April, who states she does not see a TTS order. TTS will put assessment on hold until it is determined it is necessary and patient is medically clear.

## 2019-11-04 NOTE — Telephone Encounter (Signed)
Ok

## 2019-11-04 NOTE — ED Triage Notes (Signed)
Pt returns to ED for abnormal labs.  States PCP sent her yesterday for elevated liver function and she LWBS due to wait.  Pt denies abd pain, nausea, and vomiting.  Only c/o chronic back pain.  Pt is jaundice.

## 2019-11-04 NOTE — Consult Note (Addendum)
[facility] Consult: 11:56 AM 11/04/2019  LOS: 0 days    Referring Provider: Dr Sedonia Small   Primary Care Physician:  Burnard Bunting, MD Primary Gastroenterologist:  Dr. Silverio Decamp    Reason for Consultation: Jaundice.  Dilated bile ducts.   HPI: [name] is a 85 y.o. female.  PMH obesity.  Hypothyroidism.  CKD.  Osteoarthritis.  Remote melanoma, before 2008.  S/p cholecystectomy, parathyroidectomy.  S/p spinal surgery: 2008 bilateral L4-5 lumbar laminotomies, facetectomy,  foraminotomy, and microsurgical removal of partially calcified left L4-5  synovial cyst with microdissection and decompression of the exiting L4  and L5 nerve roots bilaterally; bilateral L4-5 posterior lumbar  interbody fusion with interbody PEEK implants, mosaic synthetic bone  graft and bone marrow aspirate; and bilateral L4-5 posterolateral  arthrodesis with radius posterior instrumentation and mosaic with bone  marrow aspirate.  S/p right TKR 2004.   04/28/2019 EGD for nausea, vomiting, epigastric pain.  Diffuse, mildly edematous and erythematous mucosa throughout the stomach.  Large amount of retained food in the stomach and food residue in the duodenal bulb.  Unable to advance scope beyond second duodenum 05/03/2019 EGD: For epigastric pain.  LA grade B distal esophagitis.  Small HH.  Retained gastric fluid suctioned.  Nonbleeding erosive gastropathy.  Clean-based gastric ulcers.  Multiple gastric polyps, biopsied.  Biopsies for HP.  Acquired duodenal stenosis, biopsied.  Feeding tube placed at the end of procedure. Gastric path: chronic inactive gastritis, no dysplasia or malignancy.  Gastric polyp path: fundic gland polyp.  Duodenal path: densely inflamed SB type mucosa without dysplasia or malignancy.   05/06/2019 Laparoscopic  gastrojejunostomy, placement GJ feeding tube to address duodenal stenosis.   06/13/2019 EGD: Normal esophagus.  Gastrojejunostomy anastomosis characterized by erythema, congestion, intact staple line.  Normal pylorus and blind pouch duodenal bulb.  Last office visit (telemedicine) with Dr. Silverio Decamp was on 10/31/2019.  She had had Covid negative pneumonia the week prior and was finishing up a 10-day course of antibiotics.  She had been referred back to Dr. Silverio Decamp for evaluation of elevated LFTs noted on routine lab work at [place] office.  Bilirubin normal, AST/ALT 323/143.  Alkaline phosphatase 954.  He had had alkaline phosphatase of 153 in 09/2019.  10/30/2019 abdominal ultrasound: CBD 2.7 cm, tapers to 12 mm near head of pancreas.  Pancreatic duct dilated at 3 to 4 mm.  HOP and distal CBD not well visualized.  Mild increased liver echo, new intrahepatic bile duct dilation.  Patent portal vein with normal directional flow.  During the telemetry visit patient did not endorse fever, abdominal pain, N/V, change in bowel habits, melena or rectal bleeding.  Dr. Woodward Ku plan was MRCP which was set up for 11/10/2019 . Due to further rise of LFTs and Lipase of 504, Dr N advised pt to go to ED yesterday to expedite workup.  Pt waited 5 hours, underwent CT scan and repeat labs but LWBS, never seen by ED physician.  Pt's husband spoke w "on call Dr" who advised them to return home.   Patient returned to the ED  today and now in room in ED.   Additional details: wt drop of 50# in last 10 to 11 months (106.6 kg 01/2019, 86 kg now).  Anorexia and altered taste, especially to carbs, no problems w soft, liquid foods.  Urine is a bit darker but not yet tea-colored.  Stools light brown.  Fatigue since time of PNA diagnosis, no AMS/confusion, somnolence.    11/03/2019 CTAP without contrast: Marked dilation of intrahepatic and CBD, new finding compared with 05/01/2019.  S/p cholecystectomy.  Nonobstructing, bilateral renal stones.   Sigmoid diverticulosis.  Uterine fibroid.  Left >> right pleural effusions.  T bili 4.9.  Alkaline phosphatase 1220.  AST/ALT 319/124.  Lipase 504. Ammonia level 50 Mild AKI with normal BUN, creatinine 1.2, GFR 39. Hb 11.  WBCs normal. PT/INR 15.5/1.4    Past Medical History:  Diagnosis Date  . Allergy   . Anemia   . Anxiety   . Bronchiectasis ([place]) 10/01/2015  . Cataract    removed both eyes  . Depression   . Diabetes mellitus without complication ([place])    diet controlled DM- no meds   . Diverticulosis of colon (without mention of hemorrhage) 07/17/2007  . Gastritis 07/17/2007  . GERD (gastroesophageal reflux disease)   . Glaucoma   . History of fatty infiltration of liver   . Hyperlipemia   . Hyperparathyroidism ([place])   . Hyperparathyroidism, primary ([place]) 01/01/2017  . Hypertension    under control   . Lumbosacral root lesions, not elsewhere classified 04/13/2014  . Obese   . Osteoarthritis   . Osteoporosis   . PONV (postoperative nausea and vomiting)    Not in last several surgeries  . Renal calculus, right    severe  . Sleep apnea    uses cpap at night   . Thyroid disease     Past Surgical History:  Procedure Laterality Date  . APPENDECTOMY  1974  . BIOPSY  05/03/2019   Procedure: BIOPSY;  Surgeon: Rush Landmark Telford Nab., MD;  Location: [facility];  Service: Gastroenterology;;  . [place]   LEFT--BENIGN   . CESAREAN SECTION  1974  . CESAREAN SECTION  1971  . CHOLECYSTECTOMY  2000  . COLONOSCOPY  2008  . ESOPHAGOGASTRODUODENOSCOPY  2008  . ESOPHAGOGASTRODUODENOSCOPY (EGD) WITH PROPOFOL N/A 05/03/2019   Procedure: ESOPHAGOGASTRODUODENOSCOPY (EGD) WITH PROPOFOL;  Surgeon: Rush Landmark Telford Nab., MD;  Location: [facility];  Service: Gastroenterology;  Laterality: N/A;  . EYE SURGERY     Lasik, Cataract, Hx of Glaucoma  . GASTROJEJUNOSTOMY N/A 05/06/2019   Procedure: LAPAROSCOPIC GASTROJEJUNOSTOMY and feedling gastrojejunostomy tube;  Surgeon:  Michael Boston, MD;  Location: [facility];  Service: General;  Laterality: N/A;  . IR [place] TUBE CHANGE  06/03/2019  . KNEE ARTHROPLASTY  2004   RIGHT-TOTAL   . LUMBAR FUSION  2008  . PARATHYROIDECTOMY  2009   X 2--RIGHT INFERIOR AND LEFT SUPERIOR  . PARATHYROIDECTOMY N/A 01/04/2017   Procedure: RIGHT NECK EXPLORATION WITH PARATHYROIDECTOMY;  Surgeon: Armandina Gemma, MD;  Location: [facility];  Service: General;  Laterality: N/A;  . ROTATOR CUFF REPAIR  2000   RIGHT  . TUBAL LIGATION  1974  . UPPER GASTROINTESTINAL ENDOSCOPY      Prior to Admission medications   Medication Sig Start Date End Date Taking? Authorizing Provider  albuterol (VENTOLIN HFA) 108 (90 Base) MCG/ACT inhaler Inhale 2 puffs into the lungs every 6 (six) hours as needed for cough or shortness of breath. 10/07/19  Yes [provider]  benzonatate (TESSALON) 100 MG capsule Take 100 mg by mouth 3 (three) times daily as needed for cough.  06/18/19  Yes [provider]  cefdinir (OMNICEF) 300 MG capsule Take 300 mg by mouth 2 (two) times daily. DS 10 10/30/19  Yes [provider]  dorzolamide (TRUSOPT) 2 % ophthalmic solution Place 1 drop into both eyes 2 (two) times daily.  11/15/15  Yes [provider]  fluticasone (FLONASE) 50 MCG/ACT nasal spray Place 2 sprays into both nostrils daily. Patient taking differently: Place 2 sprays into both nostrils at bedtime as needed for allergies.  09/01/15  Yes Skeet Latch, MD  furosemide (LASIX) 20 MG tablet Take 20 mg by mouth as needed for fluid. Mon, Wed, Fri 07/27/19  Yes [provider]  gabapentin (NEURONTIN) 100 MG capsule Take 100 mg by mouth 3 (three) times daily. 10/16/19  Yes [provider]  levothyroxine (SYNTHROID) 50 MCG tablet Take 50 mcg by mouth daily. 12/18/18  Yes [provider]  mirtazapine (REMERON) 15 MG tablet Take 15 mg by mouth at bedtime. 07/27/19  Yes [provider]  Multiple Vitamins-Minerals  (PRESERVISION AREDS 2+MULTI VIT) CAPS Take 1 capsule by mouth 2 (two) times daily.   Yes [provider]  ondansetron (ZOFRAN) 4 MG tablet Take 1 tablet by mouth every 8 (eight) hours as needed for nausea or vomiting.  05/31/19  Yes [provider]  pantoprazole (PROTONIX) 40 MG tablet Take 40 mg by mouth daily.   Yes [provider]  potassium chloride (KLOR-CON) 10 MEQ tablet Take 10 mEq by mouth. Mon, Wed, Friday, if taking lasix 07/27/19  Yes [provider]  ROCKLATAN 0.02-0.005 % SOLN Place 1 drop into both eyes at bedtime. 01/08/19  Yes [provider]  simethicone (MYLICON) 80 MG chewable tablet Chew 1 tablet (80 mg total) by mouth every 4 (four) hours as needed for flatulence (also available OTC). 05/19/19  Yes Rai, Ripudeep K, MD  losartan (COZAAR) 100 MG tablet TAKE 1 TABLET EACH DAY. Patient taking differently: Take 100 mg by mouth daily.  07/24/16   Skeet Latch, MD  zoledronic acid (RECLAST) 5 MG/100ML SOLN injection Inject 5 mg into the vein yearly. [facility] June 2015)    [provider]    Scheduled Meds:  Infusions:  PRN Meds:    Allergies as of 11/04/2019 - Review Complete 11/04/2019  Allergen Reaction Noted  . Codeine Nausea And Vomiting 03/17/2014  . Contrast media [iodinated diagnostic agents] Hives, Shortness Of Breath, and Itching 05/16/2018  . Iohexol Hives and Other (See Comments) 02/21/2007  . Aspirin  06/30/2016  . Fentanyl  10/20/2016    Family History  Problem Relation Age of Onset  . Colon polyps Mother   . Hypertension Mother   . Osteoarthritis Mother   . Diabetes Father   . Stroke Father   . Hernia Father        Esophageal hernia  . Osteoarthritis Maternal Grandmother   . Stroke Maternal Grandfather   . Liver disease Paternal Grandmother   . Stroke Paternal Grandfather   . Colon cancer Neg Hx   . Esophageal cancer Neg Hx   . Rectal cancer Neg Hx   . Stomach cancer Neg Hx     Social  History   Socioeconomic History  . Marital status: Married    Spouse name: Not on file  . Number of children: 2  . Years of education: Not on file  . Highest education level: Not on file  Occupational History  . Occupation: retired  Tobacco Use  . Smoking status: Former Smoker    Packs/day: 0.25    Years: 15.00    Pack years: 3.75    Types: Cigarettes    Quit date: 09/01/2014    Years since quitting: 5.1  . Smokeless tobacco: Never Used  Substance and Sexual Activity  . Alcohol use: Yes    Alcohol/week: 1.0 standard drinks    Types: 1 Glasses of wine per week    Comment: occasional  . Drug use: No  . Sexual activity: Not on file  Other Topics Concern  . Not on file  Social History Narrative   Married, with 2 children   Right handed   Some college   No caffeine      Epworth Sleepiness Scale = 7 (as of 07/21/2015)      Social Determinants of Health   Financial Resource Strain:   . Difficulty of Paying Living Expenses: Not on file  Food Insecurity:   . Worried About Charity fundraiser in the Last Year: Not on file  . Ran Out of Food in the Last Year: Not on file  Transportation Needs:   . Lack of Transportation (Medical): Not on file  . Lack of Transportation (Non-Medical): Not on file  Physical Activity:   . Days of Exercise per Week: Not on file  . Minutes of Exercise per Session: Not on file  Stress:   . Feeling of Stress : Not on file  Social Connections:   . Frequency of Communication with Friends and Family: Not on file  . Frequency of Social Gatherings with Friends and Family: Not on file  . Attends Religious Services: Not on file  . Active Member of Clubs or Organizations: Not on file  . Attends Archivist Meetings: Not on file  . Marital Status: Not on file  Intimate Partner Violence:   . Fear of Current or Ex-Partner: Not on file  . Emotionally Abused: Not on file  . Physically Abused: Not on file  . Sexually Abused: Not on file     REVIEW OF SYSTEMS: Constitutional: Fatigue, feels worn out. ENT:  No nose bleeds Pulm: Shortness of breath and cough present a few months ago has resolved. CV:  No palpitations, no LE edema.  No angina GU:  No hematuria, no frequency. GI: See HPI. Heme: No unusual or excessive bleeding or bruising Transfusions:  None.   Neuro:  No headaches, no peripheral tingling or numbness.  No dizziness, no syncope. Derm:  No itching, no rash or sores.  Endocrine:  No sweats or chills.  No polyuria or dysuria Immunization: Not queried. Travel:  None beyond local counties in last few months.    PHYSICAL EXAM: Vital signs in last 24 hours: Vitals:   11/04/19 1103 11/04/19 1130  BP:  (!) 110/98  Pulse:  70  Resp:  13  Temp:    SpO2: 99% 99%   Wt Readings from Last 3 Encounters:  11/03/19 86.2 kg  09/04/19 93.9 kg  08/21/19 94.1 kg    General: Pleasant, well-appearing, overweight WF.  She looks at least 10 years younger than stated age of 22. Head: No facial asymmetry or swelling.  No signs of head trauma. Eyes: No scleral icterus.  No conjunctival pallor.  EOMI. Ears: Not hard of hearing Nose: No congestion, no discharge. Mouth: Oral mucosa pink, moist, clear.  Good dentition.  Tongue midline. Neck: No JVD, no masses, no thyromegaly. Lungs:  Clear bilaterally.  Rare cough.  No dyspnea. Heart: RRR.  No MRG.  S1, S2 present. Abdomen: Obese, soft.  All surgical scars well-healed.  No HSM, masses, bruits, hernias.  No tenderness.  Bowel sounds active. Rectal: Deferred Musc/Skeltl: No joint redness, swelling.  Postsurgical changes in the right knee. Extremities: No CCE. Neurologic: Fully alert.  Oriented x3.  Good historian.  Moves all 4 limbs without gross weakness but strength not tested.  No tremors. Skin: No obvious jaundice.  No rash, no sores. Tattoos: None Nodes: No cervical adenopathy Psych: Cooperative, calm, pleasant.  Intake/Output from previous day: No intake/output  data recorded. Intake/Output this shift: No intake/output data recorded.  LAB RESULTS: Recent Labs    11/03/19 1432 11/03/19 1810  WBC 5.5 6.0  HGB 11.1* 11.0*  HCT 33.3* 35.1*  PLT 329.0 358   BMET Lab Results  Component Value Date   NA 135 11/03/2019   NA 137 11/03/2019   NA 138 09/04/2019   K 4.2 11/03/2019   K 3.9 11/03/2019   K 4.0 09/04/2019   CL 105 11/03/2019   CL 107 11/03/2019   CL 104 09/04/2019   CO2 21 (L) 11/03/2019   CO2 22 11/03/2019   CO2 23 09/04/2019   GLUCOSE 92 11/03/2019   GLUCOSE 122 (H) 11/03/2019   GLUCOSE 115 (H) 09/04/2019   BUN 11 11/03/2019   BUN 12 11/03/2019   BUN 16 09/04/2019   CREATININE 1.29 (H) 11/03/2019   CREATININE 1.10 11/03/2019   CREATININE 1.11 (H) 09/04/2019   CALCIUM 9.8 11/03/2019   CALCIUM 9.7 11/03/2019   CALCIUM 10.0 09/04/2019   LFT Recent Labs    11/03/19 1432 11/03/19 1810  PROT 4.9* 5.0*  ALBUMIN 2.6* 2.2*  AST 272* 319*  ALT 111* 124*  ALKPHOS 1,220* 1,110*  BILITOT 4.4* 4.9*   PT/INR Lab Results  Component Value Date   INR 1.4 (H) 11/03/2019   INR 1.06 06/01/2010   INR 1.16 01/31/2010   Hepatitis Panel No results for input(s): HEPBSAG, HCVAB, HEPAIGM, HEPBIGM in the last 72 hours. C-Diff No components found for: CDIFF Lipase     Component Value Date/Time   LIPASE 213 (H) 11/03/2019 1810    Drugs of Abuse  No results found for: LABOPIA, COCAINSCRNUR, LABBENZ, AMPHETMU, THCU, LABBARB   RADIOLOGY STUDIES: CT ABDOMEN PELVIS WO CONTRAST  Result Date: 11/03/2019 CLINICAL DATA:  Abnormal liver labs. EXAM: CT ABDOMEN AND PELVIS WITHOUT CONTRAST TECHNIQUE: Multidetector CT imaging of the abdomen and pelvis was performed following the standard protocol without IV contrast. COMPARISON:  May 01, 2019 FINDINGS: Lower chest: Mild scarring and/or atelectasis is seen within the anterior and posterior aspect of the left lung base. There are small bilateral pleural effusions, left greater than right.  Hepatobiliary: No focal liver abnormality is seen. Status post cholecystectomy. There is marked severity intra hepatic biliary dilatation with dilatation of the common bile duct. Pancreas: Unremarkable. No pancreatic ductal dilatation or surrounding inflammatory changes. Spleen: Normal in size without focal abnormality. Adrenals/Urinary Tract: Adrenal glands are unremarkable. Kidneys are normal in size. Multiple stable bilateral renal cysts are seen. The largest measures approximately 3.7 cm in diameter and is located within the posterior aspect of the mid to lower right kidney. 1.9 cm and 0.5 cm nonobstructing renal stones are seen within the right kidney. 0.3 cm and 0.6 cm nonobstructing renal stones are seen within the left kidney. Bladder is unremarkable. Stomach/Bowel: Stomach is within normal limits. The appendix is not identified. Surgically anastomosed bowel  is seen within the anterior aspect of the mid to upper abdomen. No evidence of bowel dilatation. Noninflamed diverticula are seen throughout the sigmoid colon. Vascular/Lymphatic: Mild aortic atherosclerosis. No enlarged abdominal or pelvic lymph nodes. Reproductive: A stable 2.0 cm x 1.5 cm coarse calcification is seen within the posterior aspect of the uterus. The bilateral adnexa are unremarkable. Other: No abdominal wall hernia or abnormality. No abdominopelvic ascites. Musculoskeletal: Bilateral metallic density pedicle screws are seen at the levels of L2, L3 and L4 vertebral bodies. Moderate to marked severity degenerative changes are noted throughout the remainder of the lumbar spine. IMPRESSION: 1. Marked severity intra hepatic biliary dilatation with dilatation of the common bile duct. This represents a new finding when compared to the prior study dated May 01, 2019 and may be related to the patient's post cholecystectomy state, or possibly a distal common bile duct stricture. 2. Bilateral nonobstructing renal stones. 3. Sigmoid diverticulosis  without evidence of diverticulitis. 4. Calcified uterine fibroid. 5. Small bilateral pleural effusions, left greater than right. Electronically Signed   By: Virgina Norfolk M.D.   On: 11/03/2019 23:06      IMPRESSION:   *   Painless jaundice with dilated common bile duct, intrahepatic ducts and pancreatic duct.  This is worrisome for malignancy, pancreatic vs cholangio-carcinoma.    *  Elevated Lipase.  No sxs or imaging findings to suggest pancreatitis.    *   Duodenal stenosis associated with ulcer disease.  Status post 05/2019 gastrojejunostomy.  Patient has no lingering postop obstructive symptoms.  *    CKD, current GFR 46 consistent with stage 3a disease.      PLAN:     *   MRI abdomen/MRCP. ERCP may not be feasible given her gastrojejunostomy anatomy, but EUS is likely feasible.  Make decisions regarding endoscopic testing based on findings of MRCP.  *   Pt okay to eat after completion of the MRCP  *     LFTs in the morning.   Azucena Freed  11/04/2019, 11:56 AM Phone [phone]

## 2019-11-04 NOTE — ED Notes (Signed)
Pts pulse ox and resp leads reapplied

## 2019-11-04 NOTE — Telephone Encounter (Signed)
Patients husband is calling- states that Dr. Silverio Decamp told patient to go the ED yesterday and to be admitted. When they went to ED yesterday they waited 5 hours and states that the on call doctor told told him to just take her home. They are wanting to know what Dr. Silverio Decamp wants them to do. If they should go back to the hospital and be admitted or stay home.

## 2019-11-04 NOTE — Telephone Encounter (Signed)
Error

## 2019-11-04 NOTE — H&P (Addendum)
History and Physical    LAURON OFFENBACHER [id number] DOB: 1934-11-04 DOA: 11/04/2019  Referring MD/NP/PA: Margarita Mail, PA-C PCP: Burnard Bunting, MD  Patient coming from: Home  Chief Complaint: Abnormal liver enzymes  I have personally briefly reviewed patient's old medical records in [place]   HPI: [name] is a 85 y.o. female with medical history significant of hypertension, hyperlipidemia, diet-controlled diabetes mellitus type 2, hyperparathyroidism s/p parathyroidectomy, hypothyroidism, obesity, and sleep apnea.  Presents after being found to have elevated liver enzymes.  She had been referred to Dr. Fleeta Emmer and had a televisit on 1/29, after her primary care provider had obtained routine lab work.  Denies having any fever, chills, abdominal pain, nausea, vomiting, or change in stool.  She does endorse a intermittent cough with some mild shortness of breath, but reports that she is just recently getting over pneumonia for which she is spending shooting up a 10-day course of antibiotics.  Plan was for patient to be scheduled to have outpatient MRCP, but repeat lab work showed worsening liver enzymes for which she was advised to come to the hospital.  Patient reports that she is previously had her gallbladder removed.  ED Course: Upon admission into the emergency department patient was noted to be afebrile, blood pressure 91/52-126/52, and all other vital signs maintained.  Labs significant for hemoglobin 11, creatinine 1.26, alkaline phosphatase 1178, AST 274, ALT 112, total bilirubin 5.4, ammonia 50, and albumin 2.  Lipase 213 from 2/1.  CT scan of the abdomen without contrast last night revealed severe intrahepatic and common bile dilatation, nonobstructing bilateral kidney stones, and bilateral pleural effusions.  GI was consulted and plan on obtaining an MRCP.  Review of Systems  Constitutional: Negative for fever and malaise/fatigue.  HENT: Negative for ear discharge  and nosebleeds.   Eyes: Negative for photophobia and pain.  Respiratory: Positive for cough and shortness of breath.   Cardiovascular: Positive for leg swelling (Intermittently). Negative for chest pain.  Gastrointestinal: Negative for abdominal pain, diarrhea, nausea and vomiting.  Genitourinary: Negative for dysuria and hematuria.  Musculoskeletal: Negative for falls.  Skin: Negative for itching.  Neurological: Negative for focal weakness and loss of consciousness.  Psychiatric/Behavioral: Negative for memory loss and substance abuse.    Past Medical History:  Diagnosis Date  . Allergy   . Anemia   . Anxiety   . Bronchiectasis ([place]) 10/01/2015  . Cataract    removed both eyes  . Depression   . Diabetes mellitus without complication ([place])    diet controlled DM- no meds   . Diverticulosis of colon (without mention of hemorrhage) 07/17/2007  . Gastritis 07/17/2007  . GERD (gastroesophageal reflux disease)   . Glaucoma   . History of fatty infiltration of liver   . Hyperlipemia   . Hyperparathyroidism ([place])   . Hyperparathyroidism, primary ([place]) 01/01/2017  . Hypertension    under control   . Lumbosacral root lesions, not elsewhere classified 04/13/2014  . Obese   . Osteoarthritis   . Osteoporosis   . PONV (postoperative nausea and vomiting)    Not in last several surgeries  . Renal calculus, right    severe  . Sleep apnea    uses cpap at night   . Thyroid disease     Past Surgical History:  Procedure Laterality Date  . APPENDECTOMY  1974  . BIOPSY  05/03/2019   Procedure: BIOPSY;  Surgeon: Rush Landmark Telford Nab., MD;  Location: [facility];  Service: Gastroenterology;;  .  BREAST BIOPSY  1988   LEFT--BENIGN   . CESAREAN SECTION  1974  . CESAREAN SECTION  1971  . CHOLECYSTECTOMY  2000  . COLONOSCOPY  2008  . ESOPHAGOGASTRODUODENOSCOPY  2008  . ESOPHAGOGASTRODUODENOSCOPY (EGD) WITH PROPOFOL N/A 05/03/2019   Procedure: ESOPHAGOGASTRODUODENOSCOPY (EGD) WITH PROPOFOL;   Surgeon: Rush Landmark Telford Nab., MD;  Location: [facility];  Service: Gastroenterology;  Laterality: N/A;  . EYE SURGERY     Lasik, Cataract, Hx of Glaucoma  . GASTROJEJUNOSTOMY N/A 05/06/2019   Procedure: LAPAROSCOPIC GASTROJEJUNOSTOMY and feedling gastrojejunostomy tube;  Surgeon: Michael Boston, MD;  Location: [facility];  Service: General;  Laterality: N/A;  . IR [place] TUBE CHANGE  06/03/2019  . KNEE ARTHROPLASTY  2004   RIGHT-TOTAL   . LUMBAR FUSION  2008  . PARATHYROIDECTOMY  2009   X 2--RIGHT INFERIOR AND LEFT SUPERIOR  . PARATHYROIDECTOMY N/A 01/04/2017   Procedure: RIGHT NECK EXPLORATION WITH PARATHYROIDECTOMY;  Surgeon: Armandina Gemma, MD;  Location: [facility];  Service: General;  Laterality: N/A;  . ROTATOR CUFF REPAIR  2000   RIGHT  . TUBAL LIGATION  1974  . UPPER GASTROINTESTINAL ENDOSCOPY       reports that she quit smoking about 5 years ago. Her smoking use included cigarettes. She has a 3.75 pack-year smoking history. She has never used smokeless tobacco. She reports current alcohol use of about 1.0 standard drinks of alcohol per week. She reports that she does not use drugs.  Allergies  Allergen Reactions  . Codeine Nausea And Vomiting  . Contrast Media [Iodinated Diagnostic Agents] Hives, Shortness Of Breath and Itching  . Iohexol Hives and Other (See Comments)    REACTION: " TROUBLE BREATHING"  . Aspirin     In high doses bleeding  . Fentanyl     Altered mental state    Family History  Problem Relation Age of Onset  . Colon polyps Mother   . Hypertension Mother   . Osteoarthritis Mother   . Diabetes Father   . Stroke Father   . Hernia Father        Esophageal hernia  . Osteoarthritis Maternal Grandmother   . Stroke Maternal Grandfather   . Liver disease Paternal Grandmother   . Stroke Paternal Grandfather   . Colon cancer Neg Hx   . Esophageal cancer Neg Hx   . Rectal cancer Neg Hx   . Stomach cancer Neg Hx     Prior to Admission medications   Medication Sig  Start Date End Date Taking? Authorizing Provider  albuterol (VENTOLIN HFA) 108 (90 Base) MCG/ACT inhaler Inhale 2 puffs into the lungs every 6 (six) hours as needed for cough or shortness of breath. 10/07/19  Yes [provider]  benzonatate (TESSALON) 100 MG capsule Take 100 mg by mouth 3 (three) times daily as needed for cough.  06/18/19  Yes [provider]  cefdinir (OMNICEF) 300 MG capsule Take 300 mg by mouth 2 (two) times daily. DS 10 10/30/19  Yes [provider]  dorzolamide (TRUSOPT) 2 % ophthalmic solution Place 1 drop into both eyes 2 (two) times daily.  11/15/15  Yes [provider]  fluticasone (FLONASE) 50 MCG/ACT nasal spray Place 2 sprays into both nostrils daily. Patient taking differently: Place 2 sprays into both nostrils at bedtime as needed for allergies.  09/01/15  Yes Skeet Latch, MD  furosemide (LASIX) 20 MG tablet Take 20 mg by mouth as needed for fluid. Mon, Wed, Fri 07/27/19  Yes [provider]  gabapentin (  NEURONTIN) 100 MG capsule Take 100 mg by mouth 3 (three) times daily. 10/16/19  Yes [provider]  levothyroxine (SYNTHROID) 50 MCG tablet Take 50 mcg by mouth daily. 12/18/18  Yes [provider]  mirtazapine (REMERON) 15 MG tablet Take 15 mg by mouth at bedtime. 07/27/19  Yes [provider]  Multiple Vitamins-Minerals (PRESERVISION AREDS 2+MULTI VIT) CAPS Take 1 capsule by mouth 2 (two) times daily.   Yes [provider]  ondansetron (ZOFRAN) 4 MG tablet Take 1 tablet by mouth every 8 (eight) hours as needed for nausea or vomiting.  05/31/19  Yes [provider]  pantoprazole (PROTONIX) 40 MG tablet Take 40 mg by mouth daily.   Yes [provider]  potassium chloride (KLOR-CON) 10 MEQ tablet Take 10 mEq by mouth. Mon, Wed, Friday, if taking lasix 07/27/19  Yes [provider]  ROCKLATAN 0.02-0.005 % SOLN Place 1 drop into both eyes at bedtime. 01/08/19  Yes  [provider]  simethicone (MYLICON) 80 MG chewable tablet Chew 1 tablet (80 mg total) by mouth every 4 (four) hours as needed for flatulence (also available OTC). 05/19/19  Yes Rai, Ripudeep K, MD  losartan (COZAAR) 100 MG tablet TAKE 1 TABLET EACH DAY. Patient taking differently: Take 100 mg by mouth daily.  07/24/16   Skeet Latch, MD  zoledronic acid (RECLAST) 5 MG/100ML SOLN injection Inject 5 mg into the vein yearly. [facility] June 2015)    [provider]    Physical Exam:  Constitutional: Elderly female who appears to be in no acute distress Vitals:   11/04/19 1045 11/04/19 1100 11/04/19 1103 11/04/19 1130  BP: 105/63 (!) 109/48  (!) 110/98  Pulse: 67 66  70  Resp: 19 17  13   Temp:      TempSrc:      SpO2: 96% 100% 99% 99%   Eyes: PERRL, scleral icterus present ENMT: Mucous membranes are moist. Posterior pharynx clear of any exudate or lesions.  Neck: normal, supple, no masses, no thyromegaly Respiratory: clear to auscultation bilaterally, no wheezing, no crackles. Normal respiratory effort. No accessory muscle use.  Cardiovascular: Regular rate and rhythm, no murmurs / rubs / gallops. No extremity edema. 2+ pedal pulses. No carotid bruits.  Abdomen: no tenderness, no masses palpated. No hepatosplenomegaly. Bowel sounds positive.  Musculoskeletal: no clubbing / cyanosis. No joint deformity upper and lower extremities. Good ROM, no contractures. Normal muscle tone.  Skin: Jaundice Neurologic: CN 2-12 grossly intact. Sensation intact, DTR normal. Strength 5/5 in all 4.  Psychiatric: Normal judgment and insight. Alert and oriented x 3. Normal mood.     Labs on Admission: I have personally reviewed following labs and imaging studies  CBC: Recent Labs  Lab 11/03/19 1432 11/03/19 1810 11/04/19 1055  WBC 5.5 6.0 5.7  NEUTROABS 3.7  --  3.2  HGB 11.1* 11.0* 11.0*  HCT 33.3* 35.1* 35.0*  MCV 91.4 95.4 94.3  PLT 329.0 358 [id number]   Basic Metabolic  Panel: Recent Labs  Lab 11/03/19 1432 11/03/19 1810 11/04/19 1055  NA 137 135 140  K 3.9 4.2 3.8  CL 107 105 109  CO2 22 21* 20*  GLUCOSE 122* 92 110*  BUN 12 11 11   CREATININE 1.10 1.29* 1.26*  CALCIUM 9.7 9.8 10.1   GFR: Estimated Creatinine Clearance: 35.8 mL/min (A) (by C-G formula based on SCr of 1.26 mg/dL (H)). Liver Function Tests: Recent Labs  Lab 11/03/19 1432 11/03/19 1810 11/04/19 1055  AST 272* 319* 274*  ALT 111* 124* 112*  ALKPHOS 1,220* 1,110* 1,178*  BILITOT 4.4* 4.9* 5.4*  PROT 4.9* 5.0* 5.0*  ALBUMIN 2.6* 2.2* 2.0*   Recent Labs  Lab 11/03/19 1432 11/03/19 1810  LIPASE 504.0* 213*   Recent Labs  Lab 11/04/19 1055  AMMONIA 50*   Coagulation Profile: Recent Labs  Lab 11/03/19 1432 11/04/19 1055  INR 1.4* 1.2   Cardiac Enzymes: No results for input(s): CKTOTAL, CKMB, CKMBINDEX, TROPONINI in the last 168 hours. BNP (last 3 results) No results for input(s): PROBNP in the last 8760 hours. HbA1C: No results for input(s): HGBA1C in the last 72 hours. CBG: No results for input(s): GLUCAP in the last 168 hours. Lipid Profile: No results for input(s): CHOL, HDL, LDLCALC, TRIG, CHOLHDL, LDLDIRECT in the last 72 hours. Thyroid Function Tests: No results for input(s): TSH, T4TOTAL, FREET4, T3FREE, THYROIDAB in the last 72 hours. Anemia Panel: No results for input(s): VITAMINB12, FOLATE, FERRITIN, TIBC, IRON, RETICCTPCT in the last 72 hours. Urine analysis:    Component Value Date/Time   COLORURINE YELLOW 05/02/2019 0832   APPEARANCEUR CLEAR 05/02/2019 0832   LABSPEC 1.017 05/02/2019 0832   PHURINE 5.0 05/02/2019 0832   GLUCOSEU NEGATIVE 05/02/2019 0832   HGBUR SMALL (A) 05/02/2019 0832   BILIRUBINUR NEGATIVE 05/02/2019 0832   KETONESUR NEGATIVE 05/02/2019 0832   PROTEINUR NEGATIVE 05/02/2019 0832   UROBILINOGEN 0.2 06/01/2010 0915   NITRITE NEGATIVE 05/02/2019 0832   LEUKOCYTESUR SMALL (A) 05/02/2019 0832   Sepsis Labs: Recent  Results (from the past 240 hour(s))  Respiratory Panel by RT PCR (Flu A&B, Covid) - Nasopharyngeal Swab     Status: None   Collection Time: 11/04/19 10:50 AM   Specimen: Nasopharyngeal Swab  Result Value Ref Range Status   SARS Coronavirus 2 by RT PCR NEGATIVE NEGATIVE Final    Comment: (NOTE) SARS-CoV-2 target nucleic acids are NOT DETECTED. The SARS-CoV-2 RNA is generally detectable in upper respiratoy specimens during the acute phase of infection. The lowest concentration of SARS-CoV-2 viral copies this assay can detect is 131 copies/mL. A negative result does not preclude SARS-Cov-2 infection and should not be used as the sole basis for treatment or other patient management decisions. A negative result may occur with  improper specimen collection/handling, submission of specimen other than nasopharyngeal swab, presence of viral mutation(s) within the areas targeted by this assay, and inadequate number of viral copies (<131 copies/mL). A negative result must be combined with clinical observations, patient history, and epidemiological information. The expected result is Negative. Fact Sheet for Patients:  PinkCheek.be Fact Sheet for Healthcare Providers:  GravelBags.it This test is not yet ap proved or cleared by the [place] FDA and  has been authorized for detection and/or diagnosis of SARS-CoV-2 by FDA under an Emergency Use Authorization (EUA). This EUA will remain  in effect (meaning this test can be used) for the duration of the COVID-19 declaration under Section 564(b)(1) of the Act, 21 U.S.C. section 360bbb-3(b)(1), unless the authorization is terminated or revoked sooner.    Influenza A by PCR NEGATIVE NEGATIVE Final   Influenza B by PCR NEGATIVE NEGATIVE Final    Comment: (NOTE) The Xpert Xpress SARS-CoV-2/FLU/RSV assay is intended as an aid in  the diagnosis of influenza from Nasopharyngeal swab specimens  and  should not be used as a sole basis for treatment. Nasal washings and  aspirates are unacceptable for Xpert Xpress SARS-CoV-2/FLU/RSV  testing. Fact Sheet for Patients: PinkCheek.be Fact Sheet for Healthcare Providers: GravelBags.it This test is not  yet approved or cleared by the [place] and  has been authorized for detection and/or diagnosis of SARS-CoV-2 by  FDA under an Emergency Use Authorization (EUA). This EUA will remain  in effect (meaning this test can be used) for the duration of the  Covid-19 declaration under Section 564(b)(1) of the Act, 21  U.S.C. section 360bbb-3(b)(1), unless the authorization is  terminated or revoked. Performed at [facility], [place] [address], [place], [place] [address]      Radiological Exams on Admission: CT ABDOMEN PELVIS WO CONTRAST  Result Date: 11/03/2019 CLINICAL DATA:  Abnormal liver labs. EXAM: CT ABDOMEN AND PELVIS WITHOUT CONTRAST TECHNIQUE: Multidetector CT imaging of the abdomen and pelvis was performed following the standard protocol without IV contrast. COMPARISON:  May 01, 2019 FINDINGS: Lower chest: Mild scarring and/or atelectasis is seen within the anterior and posterior aspect of the left lung base. There are small bilateral pleural effusions, left greater than right. Hepatobiliary: No focal liver abnormality is seen. Status post cholecystectomy. There is marked severity intra hepatic biliary dilatation with dilatation of the common bile duct. Pancreas: Unremarkable. No pancreatic ductal dilatation or surrounding inflammatory changes. Spleen: Normal in size without focal abnormality. Adrenals/Urinary Tract: Adrenal glands are unremarkable. Kidneys are normal in size. Multiple stable bilateral renal cysts are seen. The largest measures approximately 3.7 cm in diameter and is located within the posterior aspect of the mid to lower right kidney. 1.9 cm and 0.5 cm  nonobstructing renal stones are seen within the right kidney. 0.3 cm and 0.6 cm nonobstructing renal stones are seen within the left kidney. Bladder is unremarkable. Stomach/Bowel: Stomach is within normal limits. The appendix is not identified. Surgically anastomosed bowel is seen within the anterior aspect of the mid to upper abdomen. No evidence of bowel dilatation. Noninflamed diverticula are seen throughout the sigmoid colon. Vascular/Lymphatic: Mild aortic atherosclerosis. No enlarged abdominal or pelvic lymph nodes. Reproductive: A stable 2.0 cm x 1.5 cm coarse calcification is seen within the posterior aspect of the uterus. The bilateral adnexa are unremarkable. Other: No abdominal wall hernia or abnormality. No abdominopelvic ascites. Musculoskeletal: Bilateral metallic density pedicle screws are seen at the levels of L2, L3 and L4 vertebral bodies. Moderate to marked severity degenerative changes are noted throughout the remainder of the lumbar spine. IMPRESSION: 1. Marked severity intra hepatic biliary dilatation with dilatation of the common bile duct. This represents a new finding when compared to the prior study dated May 01, 2019 and may be related to the patient's post cholecystectomy state, or possibly a distal common bile duct stricture. 2. Bilateral nonobstructing renal stones. 3. Sigmoid diverticulosis without evidence of diverticulitis. 4. Calcified uterine fibroid. 5. Small bilateral pleural effusions, left greater than right. Electronically Signed   By: Virgina Norfolk M.D.   On: 11/03/2019 23:06      Assessment/Plan Painless jaundice, elevated liver enzyme: Acute.  Patient presents with painless jaundice with total bilirubin elevated up to 5.4, AST 274, ALT 112, alkaline phosphatase 1178.  CT imaging revealed dilated intrahepatic aching, bile ducts.  Patient has had prior cholecystectomy.  Questioning possibility of limits ED for painless jaundice. -Admit to a MedSurg bed -Normal  saline IV fluids at 75 mL/h -N.p.o. until after MRCP -Follow-up MRCP -Recheck LFTs in a.m. -Appreciate GI consultative services -Need of ERCP versus EBUS per GI  Recent community-acquired pneumonia: Patient has 1 more dose of cefdinir to take to complete course for treatment of community-acquired pneumonia. -Complete cefdinir course -Antitussives as  needed  Bilateral pleural effusion: Acute.  Noted on CT scan.  Patient continues to have a mild cough and some shortness of breath after coughing.  Suspect this could be residual from recent pneumonia. -Continue to monitor and discontinue fluids signs of worsening shortness of breath  Chronic kidney disease stage IIIb: On admission creatinine 1.26 which appears near her baseline -Continue to monitor  Essential hypertension: Blood pressures currently stable.  Losartan was put on hold per her primary care provider.  -Hold furosemide -Restart furosemide when medically appropriate  Idiopathic peripheral neuropathy -Continue gabapentin  Hypothyroidism -Continue levothyroxine  Obesity: BMI 33.66 kg/m  DVT prophylaxis: Lovenox Code Status: Full Family Communication: No family present at bedside Disposition Plan: Possible discharge home in 2 to 3 days Consults called: Gastroenterology Admission status: Inpatient  Norval Morton MD [facility] Pager [phone]   If 7PM-7AM, please contact night-coverage [email or web address] Password [email or web address]  11/04/2019, 1:32 PM

## 2019-11-04 NOTE — Telephone Encounter (Signed)
Spoke with the husband. He agrees to go to the [facility] ED and wait again. Patient is not eating. She has gotten up this morning and OOB. Patient has difficulty advocating for herself and husband was not allowed to remain with her. He does not know if she was evaluated by the ED provider. He does know a CT was done. Questions why that was done.  He will try to get [name] to go back to the ED.

## 2019-11-04 NOTE — ED Provider Notes (Signed)
[facility] EMERGENCY DEPARTMENT Provider Note   CSN: [id number] Arrival date & time: 11/04/19  1024     History Chief Complaint  Patient presents with  . abnormal labs  . Jaundice    [name] is a 85 y.o. female sent in for elevated liver enzymes.  She has a past medical history of diabetes, diverticulosis, fatty liver, hyperlipidemia, hyperparathyroidism, hypertension, obesity, osteoporosis, renal calculus and thyroid disease.  She is status post appendectomy and cholecystectomy the patient gives the history.  History is also gathered by review of EMR.  Patient dates about 2 weeks ago she went to see her PCP.  She saw the PA at the practice.  She had gone in for some nausea, diarrhea and vomiting.  She was found at that time to have a lower lobe pneumonia and treated with cefdinir.  Patient completed her course.  She had labs drawn at that time was found to have elevated liver enzymes.  Patient was referred to her GI specialist Dr. Sheppard Penton, had labs redrawn and was found to have markedly elevated liver enzymes.  Patient came to the ER last night and had CT scan and labs drawn however she left due to the weight and returned this morning.  She is notably jaundiced.  She has no abdominal pain or nausea at this time.  HPI     Past Medical History:  Diagnosis Date  . Allergy   . Anemia   . Anxiety   . Bronchiectasis ([place]) 10/01/2015  . Cataract    removed both eyes  . Depression   . Diabetes mellitus without complication ([place])    diet controlled DM- no meds   . Diverticulosis of colon (without mention of hemorrhage) 07/17/2007  . Gastritis 07/17/2007  . GERD (gastroesophageal reflux disease)   . Glaucoma   . History of fatty infiltration of liver   . Hyperlipemia   . Hyperparathyroidism ([place])   . Hyperparathyroidism, primary ([place]) 01/01/2017  . Hypertension    under control   . Lumbosacral root lesions, not elsewhere classified 04/13/2014  . Obese   .  Osteoarthritis   . Osteoporosis   . PONV (postoperative nausea and vomiting)    Not in last several surgeries  . Renal calculus, right    severe  . Sleep apnea    uses cpap at night   . Thyroid disease     Patient Active Problem List   Diagnosis Date Noted  . Protein-calorie malnutrition, moderate ([place]) 05/06/2019  . Gastric outlet obstruction   . Duodenal obstruction, acquired   . Abnormal CT scan, stomach   . Abnormal CT scan, sigmoid colon   . Nausea and/or vomiting 05/01/2019  . Low hemoglobin   . Heme positive stool   . Iron deficiency anemia due to chronic blood loss   . Acute diverticulitis 01/28/2019  . Melena 01/28/2019  . Acute kidney injury ([place]) 01/28/2019  . CKD (chronic kidney disease) stage 3, GFR 30-59 ml/min 01/28/2019  . Hypothyroid 01/28/2019  . Acute bronchitis 01/30/2018  . OSA (obstructive sleep apnea) 11/10/2017  . Hyperparathyroidism, primary ([place]) 01/01/2017  . Chronic pain syndrome 10/20/2016  . Idiopathic peripheral neuropathy 10/20/2016  . Cough variant asthma vs uacs vs obst bronchiectasis  10/23/2015  . Obesity, Class II, BMI 35-39.9 10/08/2015  . Bronchiectasis ([place]) 10/01/2015  . Lumbosacral root lesions, not elsewhere classified 04/13/2014  . Primary open angle glaucoma 09/11/2011  . Hyperlipidemia 12/11/2007  . Depression 12/11/2007  . Hypertension 12/11/2007  .  IRRITABLE BOWEL SYNDROME 12/11/2007  . RECTAL POLYPS 12/11/2007  . NEPHROLITHIASIS, RECURRENT 12/11/2007  . MELANOMA, HX OF 12/11/2007  . GASTRITIS 07/17/2007  . DIVERTICULITIS OF COLON 11/19/2002    Past Surgical History:  Procedure Laterality Date  . APPENDECTOMY  1974  . BIOPSY  05/03/2019   Procedure: BIOPSY;  Surgeon: Rush Landmark Telford Nab., MD;  Location: [facility];  Service: Gastroenterology;;  . [place]   LEFT--BENIGN   . CESAREAN SECTION  1974  . CESAREAN SECTION  1971  . CHOLECYSTECTOMY  2000  . COLONOSCOPY  2008  . ESOPHAGOGASTRODUODENOSCOPY   2008  . ESOPHAGOGASTRODUODENOSCOPY (EGD) WITH PROPOFOL N/A 05/03/2019   Procedure: ESOPHAGOGASTRODUODENOSCOPY (EGD) WITH PROPOFOL;  Surgeon: Rush Landmark Telford Nab., MD;  Location: [facility];  Service: Gastroenterology;  Laterality: N/A;  . EYE SURGERY     Lasik, Cataract, Hx of Glaucoma  . GASTROJEJUNOSTOMY N/A 05/06/2019   Procedure: LAPAROSCOPIC GASTROJEJUNOSTOMY and feedling gastrojejunostomy tube;  Surgeon: Michael Boston, MD;  Location: [facility];  Service: General;  Laterality: N/A;  . IR [place] TUBE CHANGE  06/03/2019  . KNEE ARTHROPLASTY  2004   RIGHT-TOTAL   . LUMBAR FUSION  2008  . PARATHYROIDECTOMY  2009   X 2--RIGHT INFERIOR AND LEFT SUPERIOR  . PARATHYROIDECTOMY N/A 01/04/2017   Procedure: RIGHT NECK EXPLORATION WITH PARATHYROIDECTOMY;  Surgeon: Armandina Gemma, MD;  Location: [facility];  Service: General;  Laterality: N/A;  . ROTATOR CUFF REPAIR  2000   RIGHT  . TUBAL LIGATION  1974  . UPPER GASTROINTESTINAL ENDOSCOPY       OB History   No obstetric history on file.     Family History  Problem Relation Age of Onset  . Colon polyps Mother   . Hypertension Mother   . Osteoarthritis Mother   . Diabetes Father   . Stroke Father   . Hernia Father        Esophageal hernia  . Osteoarthritis Maternal Grandmother   . Stroke Maternal Grandfather   . Liver disease Paternal Grandmother   . Stroke Paternal Grandfather   . Colon cancer Neg Hx   . Esophageal cancer Neg Hx   . Rectal cancer Neg Hx   . Stomach cancer Neg Hx     Social History   Tobacco Use  . Smoking status: Former Smoker    Packs/day: 0.25    Years: 15.00    Pack years: 3.75    Types: Cigarettes    Quit date: 09/01/2014    Years since quitting: 5.1  . Smokeless tobacco: Never Used  Substance Use Topics  . Alcohol use: Yes    Alcohol/week: 1.0 standard drinks    Types: 1 Glasses of wine per week    Comment: occasional  . Drug use: No    Home Medications Prior to Admission medications   Medication Sig Start  Date End Date Taking? Authorizing Provider  albuterol (VENTOLIN HFA) 108 (90 Base) MCG/ACT inhaler Inhale 2 puffs into the lungs every 6 (six) hours as needed for cough or shortness of breath. 10/07/19  Yes [provider]  benzonatate (TESSALON) 100 MG capsule Take 100 mg by mouth 3 (three) times daily as needed for cough.  06/18/19  Yes [provider]  cefdinir (OMNICEF) 300 MG capsule Take 300 mg by mouth 2 (two) times daily. DS 10 10/30/19  Yes [provider]  dorzolamide (TRUSOPT) 2 % ophthalmic solution Place 1 drop into both eyes 2 (two) times daily.  11/15/15  Yes [provider]  fluticasone (FLONASE) 50 MCG/ACT nasal spray Place 2 sprays into both nostrils daily. Patient taking differently: Place 2 sprays into both nostrils at bedtime as needed for allergies.  09/01/15  Yes Skeet Latch, MD  furosemide (LASIX) 20 MG tablet Take 20 mg by mouth as needed for fluid. Mon, Wed, Fri 07/27/19  Yes [provider]  gabapentin (NEURONTIN) 100 MG capsule Take 100 mg by mouth 3 (three) times daily. 10/16/19  Yes [provider]  levothyroxine (SYNTHROID) 50 MCG tablet Take 50 mcg by mouth daily. 12/18/18  Yes [provider]  mirtazapine (REMERON) 15 MG tablet Take 15 mg by mouth at bedtime. 07/27/19  Yes [provider]  Multiple Vitamins-Minerals (PRESERVISION AREDS 2+MULTI VIT) CAPS Take 1 capsule by mouth 2 (two) times daily.   Yes [provider]  ondansetron (ZOFRAN) 4 MG tablet Take 1 tablet by mouth every 8 (eight) hours as needed for nausea or vomiting.  05/31/19  Yes [provider]  pantoprazole (PROTONIX) 40 MG tablet Take 40 mg by mouth daily.   Yes [provider]  potassium chloride (KLOR-CON) 10 MEQ tablet Take 10 mEq by mouth. Mon, Wed, Friday, if taking lasix 07/27/19  Yes [provider]  ROCKLATAN 0.02-0.005 % SOLN Place 1 drop into both eyes at bedtime. 01/08/19  Yes [provider]  simethicone (MYLICON) 80 MG chewable tablet Chew 1 tablet (80 mg total) by mouth every 4 (four) hours as needed for flatulence (also available OTC). 05/19/19  Yes Rai, Ripudeep K, MD  losartan (COZAAR) 100 MG tablet TAKE 1 TABLET EACH DAY. Patient taking differently: Take 100 mg by mouth daily.  07/24/16   Skeet Latch, MD  zoledronic acid (RECLAST) 5 MG/100ML SOLN injection Inject 5 mg into the vein yearly. [facility] June 2015)    [provider]    Allergies    Codeine, Contrast media [iodinated diagnostic agents], Iohexol, Aspirin, and Fentanyl  Review of Systems   Review of Systems Ten systems reviewed and are negative for acute change, except as noted in the HPI.   Physical Exam Updated Vital Signs BP (!) 110/98   Pulse 70   Temp (!) 97.4 F (36.3 C) (Oral)   Resp 13   SpO2 99%   Physical Exam Physical Exam  Nursing note and vitals reviewed. Constitutional: She is oriented to person, place, and time. She appears well-developed and well-nourished. No distress.  HENT:  Head: Normocephalic and atraumatic.  Eyes: Conjunctivae normal and EOM are normal. Pupils are equal, round, and reactive to light. No scleral icterus.  Neck: Normal range of motion.  Cardiovascular: Normal rate, regular rhythm and normal heart sounds.  Exam reveals no gallop and no friction rub.   No murmur heard. Pulmonary/Chest: Effort normal and breath sounds normal. No respiratory distress.  Abdominal: Soft. Bowel sounds are normal. She exhibits no distension and no mass. There is no tenderness. There is no guarding.  Neurological: She is alert and oriented to person, place, and time.  Skin: Skin is warm and dry. She is not diaphoretic.    ED Results / Procedures / Treatments   Labs (all labs ordered are listed, but only abnormal results are displayed) Labs Reviewed  CBC WITH DIFFERENTIAL/PLATELET - Abnormal; Notable for the following components:      Result Value   RBC 3.71  (*)    Hemoglobin 11.0 (*)    HCT 35.0 (*)    RDW 16.7 (*)    Eosinophils Absolute 0.9 (*)  All other components within normal limits  AMMONIA - Abnormal; Notable for the following components:   Ammonia 50 (*)    All other components within normal limits  PROTIME-INR - Abnormal; Notable for the following components:   Prothrombin Time 15.4 (*)    All other components within normal limits  RESPIRATORY PANEL BY RT PCR (FLU A&B, COVID)  HEPATIC FUNCTION PANEL  BASIC METABOLIC PANEL  HEPATITIS PANEL, ACUTE    EKG None  Radiology CT ABDOMEN PELVIS WO CONTRAST  Result Date: 11/03/2019 CLINICAL DATA:  Abnormal liver labs. EXAM: CT ABDOMEN AND PELVIS WITHOUT CONTRAST TECHNIQUE: Multidetector CT imaging of the abdomen and pelvis was performed following the standard protocol without IV contrast. COMPARISON:  May 01, 2019 FINDINGS: Lower chest: Mild scarring and/or atelectasis is seen within the anterior and posterior aspect of the left lung base. There are small bilateral pleural effusions, left greater than right. Hepatobiliary: No focal liver abnormality is seen. Status post cholecystectomy. There is marked severity intra hepatic biliary dilatation with dilatation of the common bile duct. Pancreas: Unremarkable. No pancreatic ductal dilatation or surrounding inflammatory changes. Spleen: Normal in size without focal abnormality. Adrenals/Urinary Tract: Adrenal glands are unremarkable. Kidneys are normal in size. Multiple stable bilateral renal cysts are seen. The largest measures approximately 3.7 cm in diameter and is located within the posterior aspect of the mid to lower right kidney. 1.9 cm and 0.5 cm nonobstructing renal stones are seen within the right kidney. 0.3 cm and 0.6 cm nonobstructing renal stones are seen within the left kidney. Bladder is unremarkable. Stomach/Bowel: Stomach is within normal limits. The appendix is not identified. Surgically anastomosed bowel is seen within the  anterior aspect of the mid to upper abdomen. No evidence of bowel dilatation. Noninflamed diverticula are seen throughout the sigmoid colon. Vascular/Lymphatic: Mild aortic atherosclerosis. No enlarged abdominal or pelvic lymph nodes. Reproductive: A stable 2.0 cm x 1.5 cm coarse calcification is seen within the posterior aspect of the uterus. The bilateral adnexa are unremarkable. Other: No abdominal wall hernia or abnormality. No abdominopelvic ascites. Musculoskeletal: Bilateral metallic density pedicle screws are seen at the levels of L2, L3 and L4 vertebral bodies. Moderate to marked severity degenerative changes are noted throughout the remainder of the lumbar spine. IMPRESSION: 1. Marked severity intra hepatic biliary dilatation with dilatation of the common bile duct. This represents a new finding when compared to the prior study dated May 01, 2019 and may be related to the patient's post cholecystectomy state, or possibly a distal common bile duct stricture. 2. Bilateral nonobstructing renal stones. 3. Sigmoid diverticulosis without evidence of diverticulitis. 4. Calcified uterine fibroid. 5. Small bilateral pleural effusions, left greater than right. Electronically Signed   By: Virgina Norfolk M.D.   On: 11/03/2019 23:06    Procedures Procedures (including critical care time)  Medications Ordered in ED Medications - No data to display  ED Course  I have reviewed the triage vital signs and the nursing notes.  Pertinent labs & imaging results that were available during my care of the patient were reviewed by me and considered in my medical decision making (see chart for details).    MDM Rules/Calculators/A&P                      Patient here with jaundice and markedly elevated liver enzymes.  History of nausea and abdominal pain.  Differential includes Tylenol overdose, hepatitis, cirrhosis, choledocholithiasis.  I reviewed the patient's labs which shows a normocytic anemia,  elevated  eosinophil level, elevated ammonia, hepatic function panel shows markedly elevated liver enzymes and total bilirubin.  BMP shows chronic renal insufficiency.  Covid test is negative.  I have discussed the case with Dr. Silverio Decamp via secure messaging and I have also discussed the case with PA Azucena Freed who will consult on the patient.  Patient will need admission. Final Clinical Impression(s) / ED Diagnoses Final diagnoses:  Jaundice  Elevated liver enzymes    Rx / DC Orders ED Discharge Orders    None       Margarita Mail, PA-C 11/04/19 1628    Maudie Flakes, MD 11/07/19 [phone]

## 2019-11-05 DIAGNOSIS — Z91041 Radiographic dye allergy status: Secondary | ICD-10-CM

## 2019-11-05 DIAGNOSIS — R935 Abnormal findings on diagnostic imaging of other abdominal regions, including retroperitoneum: Secondary | ICD-10-CM

## 2019-11-05 LAB — COMPREHENSIVE METABOLIC PANEL
ALT: 90 U/L — ABNORMAL HIGH (ref 0–44)
AST: 220 U/L — ABNORMAL HIGH (ref 15–41)
Albumin: 1.8 g/dL — ABNORMAL LOW (ref 3.5–5.0)
Alkaline Phosphatase: 991 U/L — ABNORMAL HIGH (ref 38–126)
Anion gap: 9 (ref 5–15)
BUN: 10 mg/dL (ref 8–23)
CO2: 20 mmol/L — ABNORMAL LOW (ref 22–32)
Calcium: 9.5 mg/dL (ref 8.9–10.3)
Chloride: 109 mmol/L (ref 98–111)
Creatinine, Ser: 1.08 mg/dL — ABNORMAL HIGH (ref 0.44–1.00)
GFR calc Af Amer: 55 mL/min — ABNORMAL LOW (ref >60)
GFR calc non Af Amer: 48 mL/min — ABNORMAL LOW (ref >60)
Glucose, Bld: 83 mg/dL (ref 70–99)
Potassium: 3.8 mmol/L (ref 3.5–5.1)
Sodium: 138 mmol/L (ref 135–145)
Total Bilirubin: 5.4 mg/dL — ABNORMAL HIGH (ref 0.3–1.2)
Total Protein: 4.2 g/dL — ABNORMAL LOW (ref 6.5–8.1)

## 2019-11-05 LAB — CBC
HCT: 30.4 % — ABNORMAL LOW (ref 36.0–46.0)
Hemoglobin: 9.8 g/dL — ABNORMAL LOW (ref 12.0–15.0)
MCH: 29.9 pg (ref 26.0–34.0)
MCHC: 32.2 g/dL (ref 30.0–36.0)
MCV: 92.7 fL (ref 80.0–100.0)
Platelets: 208 10*3/uL (ref 150–400)
RBC: 3.28 MIL/uL — ABNORMAL LOW (ref 3.87–5.11)
RDW: 16.5 % — ABNORMAL HIGH (ref 11.5–15.5)
WBC: 4.8 10*3/uL (ref 4.0–10.5)
nRBC: 0 % (ref 0.0–0.2)

## 2019-11-05 LAB — GLUCOSE, CAPILLARY
Glucose-Capillary: 70 mg/dL (ref 70–99)
Glucose-Capillary: 71 mg/dL (ref 70–99)
Glucose-Capillary: 76 mg/dL (ref 70–99)
Glucose-Capillary: 82 mg/dL (ref 70–99)

## 2019-11-05 MED ORDER — PREDNISONE 50 MG PO TABS
50.0000 mg | ORAL_TABLET | Freq: Once | ORAL | Status: AC
  Administered 2019-11-06: 50 mg via ORAL
  Filled 2019-11-05: qty 1

## 2019-11-05 MED ORDER — ADULT MULTIVITAMIN W/MINERALS CH
1.0000 | ORAL_TABLET | Freq: Every day | ORAL | Status: DC
  Administered 2019-11-05 – 2019-11-11 (×4): 1 via ORAL
  Filled 2019-11-05 (×5): qty 1

## 2019-11-05 MED ORDER — ENOXAPARIN SODIUM 40 MG/0.4ML ~~LOC~~ SOLN: 40.0000 mg | SUBCUTANEOUS | Status: DC

## 2019-11-05 MED ORDER — PREDNISONE 50 MG PO TABS
50.0000 mg | ORAL_TABLET | Freq: Once | ORAL | Status: AC
  Administered 2019-11-06: 07:00:00 50 mg via ORAL
  Filled 2019-11-05 (×2): qty 1

## 2019-11-05 MED ORDER — PREDNISONE 50 MG PO TABS
50.0000 mg | ORAL_TABLET | Freq: Once | ORAL | Status: AC
  Administered 2019-11-05: 21:00:00 50 mg via ORAL
  Filled 2019-11-05: qty 1

## 2019-11-05 NOTE — Progress Notes (Signed)
Initial Nutrition Assessment  DOCUMENTATION CODES:   Obesity unspecified  INTERVENTION:   -Continue Ensure Enlive po BID, each supplement provides 350 kcal and 20 grams of protein -MVI with minerals daily  NUTRITION DIAGNOSIS:   Increased nutrient needs related to acute illness as evidenced by estimated needs.  GOAL:   Patient will meet greater than or equal to 90% of their needs  MONITOR:   PO intake, Supplement acceptance, Labs, Weight trends, Skin, I & O's  REASON FOR ASSESSMENT:   Malnutrition Screening Tool    ASSESSMENT:   [name] is a 85 y.o. female with medical history significant of hypertension, hyperlipidemia, diet-controlled diabetes mellitus type 2, hyperparathyroidism s/p parathyroidectomy, hypothyroidism, obesity, and sleep apnea.  Presents after being found to have elevated liver enzymes.  She had been referred to Dr. Fleeta Emmer and had a televisit on 1/29, after her primary care provider had obtained routine lab work.  Denies having any fever, chills, abdominal pain, nausea, vomiting, or change in stool.  She does endorse a intermittent cough with some mild shortness of breath, but reports that she is just recently getting over pneumonia for which she is spending shooting up a 10-day course of antibiotics.  Plan was for patient to be scheduled to have outpatient MRCP, but repeat lab work showed worsening liver enzymes for which she was advised to come to the hospital.  Patient reports that she is previously had her gallbladder removed.  Pt admitted with painless jaundice and elevated liver enzymes.   Reviewed I/O's: +820 ml x 24 hours   UOP: 100 ml x 24 hours  Per GI notes, pt with pancreatic mass with dilated biliary tree concerning for malignancy. Previous MRI/MRCP revealed diffuse biliary and pancreatic duct dilatation with 2 cm lesion at head of pancreas suspicious for pancreatic carcinoma. Plan for EUS/ERCP tomorrow AM. Pt may require drain placement  if unable to access ampulla due to anatomy.   Attempted to speak with pt x 2, however, in with MD at times of visits.   Per chart review, pt tolerating solid foods without difficulty. Meal completion documented at 75%. She is also accepting Ensure supplements.    Reviewed wt hx; noted pt has experienced a 7.4% wt loss over the past 3 months. While this is not significant for time frame, it is concerning given possible malignancy. Pt would benefit from continued use of oral nutrition supplements.   Medications reviewed and include prednisone, remeron and 0.9% sodium chloride infusion @ 75 ml/hr.   Labs reviewed: CBGS: 70-88 (inpatient orders for glycemic control are none).   Diet Order:   Diet Order            Diet NPO time specified Except for: Sips with Meds  Diet effective midnight        Diet Heart Room service appropriate? Yes; Fluid consistency: Thin  Diet effective now              EDUCATION NEEDS:   No education needs have been identified at this time  Skin:  Skin Assessment: Reviewed RN Assessment  Last BM:  Unknown  Height:   Ht Readings from Last 1 Encounters:  11/05/19 5\' 3"  (1.6 m)    Weight:   Wt Readings from Last 1 Encounters:  11/05/19 86.2 kg    Ideal Body Weight:  52.3 kg  BMI:  Body mass index is 33.66 kg/m.  Estimated Nutritional Needs:   Kcal:  1650-1850  Protein:  85-100 grams  Fluid:  >  1.6 L    [name] A. Jimmye Norman, RD, LDN, [place] Registered Dietitian II Certified Diabetes Care and Education Specialist Pager: [phone] After hours Pager: [phone]

## 2019-11-05 NOTE — Progress Notes (Addendum)
Daily Rounding Note  11/05/2019, 10:23 AM  LOS: 1 day   SUBJECTIVE:   Chief complaint: Elevated LFTs without obvious jaundice.  Pancreatic mass with dilated biliary tree. Patient feels okay.  She continues to have a mostly dry cough but no dyspnea. Urine is quite dark and she is not urinating at her baseline volume. No pruritus.  No abdominal pain.  Tolerating solid food.  No BM.  OBJECTIVE:         Vital signs in last 24 hours:    Temp:  [97.4 F (36.3 C)-98 F (36.7 C)] 97.7 F (36.5 C) (02/03 0257) Pulse Rate:  [66-90] 69 (02/03 0257) Resp:  [0-22] 16 (02/03 0257) BP: (91-163)/(43-98) 106/43 (02/03 0257) SpO2:  [93 %-100 %] 93 % (02/03 0257) Weight:  [86.2 kg] 86.2 kg (02/03 0912)   Filed Weights   11/05/19 0912  Weight: 86.2 kg   General: Pleasant, cooperative, comfortable, alert Heart: RRR. Chest: Periodic, brief dry cough.  Lungs clear bilaterally.  No labored breathing Abdomen: Soft.  Not tender or distended.  Active bowel sounds. Extremities: No CCE. Neuro/Psych: Alert.  Oriented x3.  Calm.  Moves all 4 limbs, no weakness.  Intake/Output from previous day: 02/02 0701 - 02/03 0700 In: 919.8 [I.V.:919.8] Out: 100 [Urine:100]  Intake/Output this shift: Total I/O In: 360 [P.O.:360] Out: 200 [Urine:200]  Lab Results: Recent Labs    11/03/19 1810 11/04/19 1055 11/05/19 0249  WBC 6.0 5.7 4.8  HGB 11.0* 11.0* 9.8*  HCT 35.1* 35.0* 30.4*  PLT 358 345 208   BMET Recent Labs    11/03/19 1810 11/04/19 1055 11/05/19 0249  NA 135 140 138  K 4.2 3.8 3.8  CL 105 109 109  CO2 21* 20* 20*  GLUCOSE 92 110* 83  BUN 11 11 10   CREATININE 1.29* 1.26* 1.08*  CALCIUM 9.8 10.1 9.5   LFT Recent Labs    11/03/19 1810 11/04/19 1055 11/05/19 0249  PROT 5.0* 5.0* 4.2*  ALBUMIN 2.2* 2.0* 1.8*  AST 319* 274* 220*  ALT 124* 112* 90*  ALKPHOS 1,110* 1,178* 991*  BILITOT 4.9* 5.4* 5.4*  BILIDIR  --   3.6*  --   IBILI  --  1.8*  --    PT/INR Recent Labs    11/03/19 1432 11/04/19 1055  LABPROT 15.5* 15.4*  INR 1.4* 1.2   Hepatitis Panel Recent Labs    11/04/19 1055  HEPBSAG NON REACTIVE  HCVAB NON REACTIVE  HEPAIGM NON REACTIVE  HEPBIGM NON REACTIVE    Studies/Results: CT ABDOMEN PELVIS WO CONTRAST  Result Date: 11/03/2019 CLINICAL DATA:  Abnormal liver labs. EXAM: CT ABDOMEN AND PELVIS WITHOUT CONTRAST TECHNIQUE: Multidetector CT imaging of the abdomen and pelvis was performed following the standard protocol without IV contrast. COMPARISON:  May 01, 2019 FINDINGS: Lower chest: Mild scarring and/or atelectasis is seen within the anterior and posterior aspect of the left lung base. There are small bilateral pleural effusions, left greater than right. Hepatobiliary: No focal liver abnormality is seen. Status post cholecystectomy. There is marked severity intra hepatic biliary dilatation with dilatation of the common bile duct. Pancreas: Unremarkable. No pancreatic ductal dilatation or surrounding inflammatory changes. Spleen: Normal in size without focal abnormality. Adrenals/Urinary Tract: Adrenal glands are unremarkable. Kidneys are normal in size. Multiple stable bilateral renal cysts are seen. The largest measures approximately 3.7 cm in diameter and is located within the posterior aspect of the mid to lower right kidney. 1.9 cm and  0.5 cm nonobstructing renal stones are seen within the right kidney. 0.3 cm and 0.6 cm nonobstructing renal stones are seen within the left kidney. Bladder is unremarkable. Stomach/Bowel: Stomach is within normal limits. The appendix is not identified. Surgically anastomosed bowel is seen within the anterior aspect of the mid to upper abdomen. No evidence of bowel dilatation. Noninflamed diverticula are seen throughout the sigmoid colon. Vascular/Lymphatic: Mild aortic atherosclerosis. No enlarged abdominal or pelvic lymph nodes. Reproductive: A stable 2.0 cm  x 1.5 cm coarse calcification is seen within the posterior aspect of the uterus. The bilateral adnexa are unremarkable. Other: No abdominal wall hernia or abnormality. No abdominopelvic ascites. Musculoskeletal: Bilateral metallic density pedicle screws are seen at the levels of L2, L3 and L4 vertebral bodies. Moderate to marked severity degenerative changes are noted throughout the remainder of the lumbar spine. IMPRESSION: 1. Marked severity intra hepatic biliary dilatation with dilatation of the common bile duct. This represents a new finding when compared to the prior study dated May 01, 2019 and may be related to the patient's post cholecystectomy state, or possibly a distal common bile duct stricture. 2. Bilateral nonobstructing renal stones. 3. Sigmoid diverticulosis without evidence of diverticulitis. 4. Calcified uterine fibroid. 5. Small bilateral pleural effusions, left greater than right. Electronically Signed   By: Virgina Norfolk M.D.   On: 11/03/2019 23:06   MR 3D Recon At Scanner  Result Date: 11/04/2019 CLINICAL DATA:  Painless jaundice. Weight loss. Biliary ductal dilatation is on recent CT. Prior cholecystectomy. EXAM: MRI ABDOMEN WITHOUT AND WITH CONTRAST (INCLUDING MRCP) TECHNIQUE: Multiplanar multisequence MR imaging of the abdomen was performed both before and after the administration of intravenous contrast. Heavily T2-weighted images of the biliary and pancreatic ducts were obtained, and three-dimensional MRCP images were rendered by post processing. CONTRAST:  99mL GADAVIST GADOBUTROL 1 MMOL/ML IV SOLN COMPARISON:  CT on 11/03/2019 and 05/01/2019 FINDINGS: Lower chest: Small bilateral pleural effusions again seen. Hepatobiliary: No hepatic masses identified. Prior cholecystectomy. Severe diffuse biliary ductal dilatation is again seen with common bile duct measuring 31 mm diameter. Stricture of the distal common bile duct is seen in the pancreatic head. No evidence of  choledocholithiasis. Pancreas: Mild diffuse pancreatic ductal dilatation is seen. An ill-defined enhancing lesion with central cystic foci is seen in the pancreatic head, which measures 2.1 x 1.8 cm on image 97/series 1201. This also causes obstruction of the distal common bile duct. This is highly suspicious for pancreatic carcinoma. Spleen:  Within normal limits in size and appearance. Adrenals/Urinary Tract: Bilateral renal cysts are noted. No masses identified. No evidence of hydronephrosis. Stomach/Bowel: Visualized portion unremarkable. Vascular/Lymphatic: No pathologically enlarged lymph nodes identified. No abdominal aortic aneurysm. Other:  None. Musculoskeletal:  No suspicious bone lesions identified. IMPRESSION: 1. Diffuse biliary and pancreatic ductal dilatation due to 2 cm lesion in the pancreatic head. This is highly suspicious for pancreatic carcinoma. Consider endoscopic ultrasound for further evaluation. 2. No evidence of abdominal metastatic disease. 3. Small bilateral pleural effusions. Electronically Signed   By: Marlaine Hind M.D.   On: 11/04/2019 18:41   MR ABDOMEN MRCP W WO CONTAST  Result Date: 11/04/2019 CLINICAL DATA:  Painless jaundice. Weight loss. Biliary ductal dilatation is on recent CT. Prior cholecystectomy. EXAM: MRI ABDOMEN WITHOUT AND WITH CONTRAST (INCLUDING MRCP) TECHNIQUE: Multiplanar multisequence MR imaging of the abdomen was performed both before and after the administration of intravenous contrast. Heavily T2-weighted images of the biliary and pancreatic ducts were obtained, and three-dimensional MRCP images were rendered  by post processing. CONTRAST:  75mL GADAVIST GADOBUTROL 1 MMOL/ML IV SOLN COMPARISON:  CT on 11/03/2019 and 05/01/2019 FINDINGS: Lower chest: Small bilateral pleural effusions again seen. Hepatobiliary: No hepatic masses identified. Prior cholecystectomy. Severe diffuse biliary ductal dilatation is again seen with common bile duct measuring 31 mm  diameter. Stricture of the distal common bile duct is seen in the pancreatic head. No evidence of choledocholithiasis. Pancreas: Mild diffuse pancreatic ductal dilatation is seen. An ill-defined enhancing lesion with central cystic foci is seen in the pancreatic head, which measures 2.1 x 1.8 cm on image 97/series 1201. This also causes obstruction of the distal common bile duct. This is highly suspicious for pancreatic carcinoma. Spleen:  Within normal limits in size and appearance. Adrenals/Urinary Tract: Bilateral renal cysts are noted. No masses identified. No evidence of hydronephrosis. Stomach/Bowel: Visualized portion unremarkable. Vascular/Lymphatic: No pathologically enlarged lymph nodes identified. No abdominal aortic aneurysm. Other:  None. Musculoskeletal:  No suspicious bone lesions identified. IMPRESSION: 1. Diffuse biliary and pancreatic ductal dilatation due to 2 cm lesion in the pancreatic head. This is highly suspicious for pancreatic carcinoma. Consider endoscopic ultrasound for further evaluation. 2. No evidence of abdominal metastatic disease. 3. Small bilateral pleural effusions. Electronically Signed   By: Marlaine Hind M.D.   On: 11/04/2019 18:41   Scheduled Meds: . dorzolamide  1 drop Both Eyes BID  . [START ON 11/07/2019] enoxaparin (LOVENOX) injection  40 mg Subcutaneous Q24H  . feeding supplement (ENSURE ENLIVE)  237 mL Oral BID BM  . gabapentin  100 mg Oral TID  . levothyroxine  50 mcg Oral Daily  . mirtazapine  15 mg Oral QHS  . Netarsudil-Latanoprost  1 drop Both Eyes QHS  . pantoprazole  40 mg Oral Daily  . [START ON 11/06/2019] predniSONE  50 mg Oral Once  . predniSONE  50 mg Oral Once  . [START ON 11/06/2019] predniSONE  50 mg Oral Once  . sodium chloride flush  3 mL Intravenous Q12H   Continuous Infusions: . sodium chloride 50 mL/hr at 11/05/19 1200   PRN Meds:.albuterol, benzonatate, fluticasone, ondansetron **OR** ondansetron (ZOFRAN) IV, simethicone   ASSESMENT:     *   Elevated LFTs w/o visible jaundice.  Pancreatic mass with dilated common bile duct, intrahepatic ducts and pancreatic duct. 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  MRI/MRCP showing diffuse biliary and pancreatic duct dilatation with 2 cm lesion at head of pancreas suspicious for pancreatic carcinoma.  No evidence of abdominal mets.  LFTS improved in last 48 hours but remain elevated.  INR 1.2.    *  Elevated Lipase.  No sxs or imaging findings to suggest pancreatitis.    *   Duodenal stenosis a/w pyloric gastric ulcer.  S/p 05/2019 gastrojejunostomy (GJ).  No lingering postop obstructive symptoms.  *    CKD, current GFR 46 consistent with stage 3a disease.     *    Normocytic anemia.   No signs of overt GI or other types of bleeding.       PLAN   *   EUS, ERCP 0915 on 2/4.  If unable to access ampulla due to post GJ anatomy, will need perc drain placement/stent per IR.  Risks of bleeding, pancreatitis, sedation, infection, inability to reach ampulla/place stent d/w pt and husband.  Pt is agreeable.  Hold Lovenox until 2/5.  PAS hose in meantime.  NPO after midnight.   preop prednisone for hx contrast induced anaphylaxis.         Azucena Freed  11/05/2019,  10:23 AM Phone [phone]

## 2019-11-05 NOTE — Progress Notes (Signed)
PROGRESS NOTE    [name]  [id number] DOB: 1935-04-19 DOA: 11/04/2019 PCP: Burnard Bunting, MD    Brief Narrative:  85 year old female with history of hypertension, hyperlipidemia, diet-controlled diabetes, hyperparathyroidism status post parathyroidectomy, obesity and sleep apnea.  Recently treated for pneumonia.  History of gastrojejunostomy for a stricture and J-tube placement who was following outpatient and found to have painless jaundice on routine evaluation. She was been evaluated for outpatient MRCP, however repeat lab work showed worsening liver enzymes and was brought to the hospital. In the emergency room, hemodynamically stable.  Bilirubin 5.4.  AST/ALT, 274/112.  CT scan abdomen showed severe intrahepatic and common bile duct dilatation. MRI, MRCP showed diffuse biliary and pancreatic ductal dilation and 2 cm lesion in the pancreatic head.  Assessment & Plan:   Principal Problem:   Painless jaundice Active Problems:   Hypertension   CKD (chronic kidney disease), stage III   Hypothyroid   Idiopathic peripheral neuropathy   Elevated liver enzymes   Pleural effusion, bilateral  Painless jaundice, pancreatic head mass: Currently hemodynamically stable. Continue monitoring.  Maintenance IV fluids. Followed by gastroenterology, scheduled for EUS and ERCP tomorrow morning. Further management as per diagnostic outcome.  Hypertension: Blood pressures are stable.  Chronic kidney disease stage IIIb: Stable.  On maintenance fluid.  Decrease fluid volume, she is adequately volume resuscitated.  Recently treated community-acquired pneumonia: Finished antibiotic therapy.  Stable.  Hypothyroidism: On Synthroid.  Continue.   DVT prophylaxis: Lovenox subcu Code Status: Full code Family Communication: None Disposition Plan: patient is from home. Anticipated DC to home, Barriers to discharge, on active treatment.  Plan for procedure tomorrow morning.   Consultants:     Gastroenterology  Procedures:   None  Antimicrobials:   None   Subjective: Patient seen and examined.  Complains of some dry cough and postnasal drip for which she is using Flonase.  Denies any other abdominal pain.  Denies any nausea vomiting.  Objective: Vitals:   11/04/19 1553 11/04/19 1951 11/05/19 0257 11/05/19 0912  BP: (!) 163/65 110/84 (!) 106/43   Pulse: 69 69 69   Resp: 20 17 16    Temp: 97.8 F (36.6 C) 98 F (36.7 C) 97.7 F (36.5 C)   TempSrc: Oral Oral Oral   SpO2: 100% 100% 93%   Weight:    86.2 kg  Height:    5\' 3"  (1.6 m)    Intake/Output Summary (Last 24 hours) at 11/05/2019 1140 Last data filed at 11/05/2019 0900 Gross per 24 hour  Intake 1279.75 ml  Output 300 ml  Net 979.75 ml   Filed Weights   11/05/19 0912  Weight: 86.2 kg    Examination:  General exam: Appears calm and comfortable , icteric.  On room air. Respiratory system: Clear to auscultation. Respiratory effort normal. Cardiovascular system: S1 & S2 heard, RRR. No pedal edema. Gastrointestinal system: Abdomen is nondistended, soft and nontender. No organomegaly or masses felt. Normal bowel sounds heard. Obese pendulous.  No localized tenderness. Central nervous system: Alert and oriented. No focal neurological deficits. Extremities: Symmetric 5 x 5 power. Skin: No rashes, lesions or ulcers Psychiatry: Judgement and insight appear normal. Mood & affect appropriate.     Data Reviewed: I have personally reviewed following labs and imaging studies  CBC: Recent Labs  Lab 11/03/19 1432 11/03/19 1810 11/04/19 1055 11/05/19 0249  WBC 5.5 6.0 5.7 4.8  NEUTROABS 3.7  --  3.2  --   HGB 11.1* 11.0* 11.0* 9.8*  HCT 33.3* 35.1* 35.0*  30.4*  MCV 91.4 95.4 94.3 92.7  PLT 329.0 358 345 [id number]   Basic Metabolic Panel: Recent Labs  Lab 11/03/19 1432 11/03/19 1810 11/04/19 1055 11/05/19 0249  NA 137 135 140 138  K 3.9 4.2 3.8 3.8  CL 107 105 109 109  CO2 22 21* 20* 20*  GLUCOSE  122* 92 110* 83  BUN 12 11 11 10   CREATININE 1.10 1.29* 1.26* 1.08*  CALCIUM 9.7 9.8 10.1 9.5   GFR: Estimated Creatinine Clearance: 41.8 mL/min (A) (by C-G formula based on SCr of 1.08 mg/dL (H)). Liver Function Tests: Recent Labs  Lab 11/03/19 1432 11/03/19 1810 11/04/19 1055 11/05/19 0249  AST 272* 319* 274* 220*  ALT 111* 124* 112* 90*  ALKPHOS 1,220* 1,110* 1,178* 991*  BILITOT 4.4* 4.9* 5.4* 5.4*  PROT 4.9* 5.0* 5.0* 4.2*  ALBUMIN 2.6* 2.2* 2.0* 1.8*   Recent Labs  Lab 11/03/19 1432 11/03/19 1810  LIPASE 504.0* 213*   Recent Labs  Lab 11/04/19 1055  AMMONIA 50*   Coagulation Profile: Recent Labs  Lab 11/03/19 1432 11/04/19 1055  INR 1.4* 1.2   Cardiac Enzymes: No results for input(s): CKTOTAL, CKMB, CKMBINDEX, TROPONINI in the last 168 hours. BNP (last 3 results) No results for input(s): PROBNP in the last 8760 hours. HbA1C: No results for input(s): HGBA1C in the last 72 hours. CBG: Recent Labs  Lab 11/04/19 1815 11/04/19 1948 11/05/19 0017 11/05/19 0332 11/05/19 0724  GLUCAP 79 88 82 71 70   Lipid Profile: No results for input(s): CHOL, HDL, LDLCALC, TRIG, CHOLHDL, LDLDIRECT in the last 72 hours. Thyroid Function Tests: No results for input(s): TSH, T4TOTAL, FREET4, T3FREE, THYROIDAB in the last 72 hours. Anemia Panel: No results for input(s): VITAMINB12, FOLATE, FERRITIN, TIBC, IRON, RETICCTPCT in the last 72 hours. Sepsis Labs: No results for input(s): PROCALCITON, LATICACIDVEN in the last 168 hours.  Recent Results (from the past 240 hour(s))  Respiratory Panel by RT PCR (Flu A&B, Covid) - Nasopharyngeal Swab     Status: None   Collection Time: 11/04/19 10:50 AM   Specimen: Nasopharyngeal Swab  Result Value Ref Range Status   SARS Coronavirus 2 by RT PCR NEGATIVE NEGATIVE Final    Comment: (NOTE) SARS-CoV-2 target nucleic acids are NOT DETECTED. The SARS-CoV-2 RNA is generally detectable in upper respiratoy specimens during the acute  phase of infection. The lowest concentration of SARS-CoV-2 viral copies this assay can detect is 131 copies/mL. A negative result does not preclude SARS-Cov-2 infection and should not be used as the sole basis for treatment or other patient management decisions. A negative result may occur with  improper specimen collection/handling, submission of specimen other than nasopharyngeal swab, presence of viral mutation(s) within the areas targeted by this assay, and inadequate number of viral copies (<131 copies/mL). A negative result must be combined with clinical observations, patient history, and epidemiological information. The expected result is Negative. Fact Sheet for Patients:  PinkCheek.be Fact Sheet for Healthcare Providers:  GravelBags.it This test is not yet ap proved or cleared by the [place] FDA and  has been authorized for detection and/or diagnosis of SARS-CoV-2 by FDA under an Emergency Use Authorization (EUA). This EUA will remain  in effect (meaning this test can be used) for the duration of the COVID-19 declaration under Section 564(b)(1) of the Act, 21 U.S.C. section 360bbb-3(b)(1), unless the authorization is terminated or revoked sooner.    Influenza A by PCR NEGATIVE NEGATIVE Final   Influenza B by PCR NEGATIVE NEGATIVE Final  Comment: (NOTE) The Xpert Xpress SARS-CoV-2/FLU/RSV assay is intended as an aid in  the diagnosis of influenza from Nasopharyngeal swab specimens and  should not be used as a sole basis for treatment. Nasal washings and  aspirates are unacceptable for Xpert Xpress SARS-CoV-2/FLU/RSV  testing. Fact Sheet for Patients: PinkCheek.be Fact Sheet for Healthcare Providers: GravelBags.it This test is not yet approved or cleared by the [place] FDA and  has been authorized for detection and/or diagnosis of SARS-CoV-2 by    FDA under an Emergency Use Authorization (EUA). This EUA will remain  in effect (meaning this test can be used) for the duration of the  Covid-19 declaration under Section 564(b)(1) of the Act, 21  U.S.C. section 360bbb-3(b)(1), unless the authorization is  terminated or revoked. Performed at [facility], [address] [place], [place] [address]          Radiology Studies: CT ABDOMEN PELVIS WO CONTRAST  Result Date: 11/03/2019 CLINICAL DATA:  Abnormal liver labs. EXAM: CT ABDOMEN AND PELVIS WITHOUT CONTRAST TECHNIQUE: Multidetector CT imaging of the abdomen and pelvis was performed following the standard protocol without IV contrast. COMPARISON:  May 01, 2019 FINDINGS: Lower chest: Mild scarring and/or atelectasis is seen within the anterior and posterior aspect of the left lung base. There are small bilateral pleural effusions, left greater than right. Hepatobiliary: No focal liver abnormality is seen. Status post cholecystectomy. There is marked severity intra hepatic biliary dilatation with dilatation of the common bile duct. Pancreas: Unremarkable. No pancreatic ductal dilatation or surrounding inflammatory changes. Spleen: Normal in size without focal abnormality. Adrenals/Urinary Tract: Adrenal glands are unremarkable. Kidneys are normal in size. Multiple stable bilateral renal cysts are seen. The largest measures approximately 3.7 cm in diameter and is located within the posterior aspect of the mid to lower right kidney. 1.9 cm and 0.5 cm nonobstructing renal stones are seen within the right kidney. 0.3 cm and 0.6 cm nonobstructing renal stones are seen within the left kidney. Bladder is unremarkable. Stomach/Bowel: Stomach is within normal limits. The appendix is not identified. Surgically anastomosed bowel is seen within the anterior aspect of the mid to upper abdomen. No evidence of bowel dilatation. Noninflamed diverticula are seen throughout the sigmoid colon.  Vascular/Lymphatic: Mild aortic atherosclerosis. No enlarged abdominal or pelvic lymph nodes. Reproductive: A stable 2.0 cm x 1.5 cm coarse calcification is seen within the posterior aspect of the uterus. The bilateral adnexa are unremarkable. Other: No abdominal wall hernia or abnormality. No abdominopelvic ascites. Musculoskeletal: Bilateral metallic density pedicle screws are seen at the levels of L2, L3 and L4 vertebral bodies. Moderate to marked severity degenerative changes are noted throughout the remainder of the lumbar spine. IMPRESSION: 1. Marked severity intra hepatic biliary dilatation with dilatation of the common bile duct. This represents a new finding when compared to the prior study dated May 01, 2019 and may be related to the patient's post cholecystectomy state, or possibly a distal common bile duct stricture. 2. Bilateral nonobstructing renal stones. 3. Sigmoid diverticulosis without evidence of diverticulitis. 4. Calcified uterine fibroid. 5. Small bilateral pleural effusions, left greater than right. Electronically Signed   By: Virgina Norfolk M.D.   On: 11/03/2019 23:06   MR 3D Recon At Scanner  Result Date: 11/04/2019 CLINICAL DATA:  Painless jaundice. Weight loss. Biliary ductal dilatation is on recent CT. Prior cholecystectomy. EXAM: MRI ABDOMEN WITHOUT AND WITH CONTRAST (INCLUDING MRCP) TECHNIQUE: Multiplanar multisequence MR imaging of the abdomen was performed both before and after  the administration of intravenous contrast. Heavily T2-weighted images of the biliary and pancreatic ducts were obtained, and three-dimensional MRCP images were rendered by post processing. CONTRAST:  52mL GADAVIST GADOBUTROL 1 MMOL/ML IV SOLN COMPARISON:  CT on 11/03/2019 and 05/01/2019 FINDINGS: Lower chest: Small bilateral pleural effusions again seen. Hepatobiliary: No hepatic masses identified. Prior cholecystectomy. Severe diffuse biliary ductal dilatation is again seen with common bile duct  measuring 31 mm diameter. Stricture of the distal common bile duct is seen in the pancreatic head. No evidence of choledocholithiasis. Pancreas: Mild diffuse pancreatic ductal dilatation is seen. An ill-defined enhancing lesion with central cystic foci is seen in the pancreatic head, which measures 2.1 x 1.8 cm on image 97/series 1201. This also causes obstruction of the distal common bile duct. This is highly suspicious for pancreatic carcinoma. Spleen:  Within normal limits in size and appearance. Adrenals/Urinary Tract: Bilateral renal cysts are noted. No masses identified. No evidence of hydronephrosis. Stomach/Bowel: Visualized portion unremarkable. Vascular/Lymphatic: No pathologically enlarged lymph nodes identified. No abdominal aortic aneurysm. Other:  None. Musculoskeletal:  No suspicious bone lesions identified. IMPRESSION: 1. Diffuse biliary and pancreatic ductal dilatation due to 2 cm lesion in the pancreatic head. This is highly suspicious for pancreatic carcinoma. Consider endoscopic ultrasound for further evaluation. 2. No evidence of abdominal metastatic disease. 3. Small bilateral pleural effusions. Electronically Signed   By: Marlaine Hind M.D.   On: 11/04/2019 18:41   MR ABDOMEN MRCP W WO CONTAST  Result Date: 11/04/2019 CLINICAL DATA:  Painless jaundice. Weight loss. Biliary ductal dilatation is on recent CT. Prior cholecystectomy. EXAM: MRI ABDOMEN WITHOUT AND WITH CONTRAST (INCLUDING MRCP) TECHNIQUE: Multiplanar multisequence MR imaging of the abdomen was performed both before and after the administration of intravenous contrast. Heavily T2-weighted images of the biliary and pancreatic ducts were obtained, and three-dimensional MRCP images were rendered by post processing. CONTRAST:  43mL GADAVIST GADOBUTROL 1 MMOL/ML IV SOLN COMPARISON:  CT on 11/03/2019 and 05/01/2019 FINDINGS: Lower chest: Small bilateral pleural effusions again seen. Hepatobiliary: No hepatic masses identified. Prior  cholecystectomy. Severe diffuse biliary ductal dilatation is again seen with common bile duct measuring 31 mm diameter. Stricture of the distal common bile duct is seen in the pancreatic head. No evidence of choledocholithiasis. Pancreas: Mild diffuse pancreatic ductal dilatation is seen. An ill-defined enhancing lesion with central cystic foci is seen in the pancreatic head, which measures 2.1 x 1.8 cm on image 97/series 1201. This also causes obstruction of the distal common bile duct. This is highly suspicious for pancreatic carcinoma. Spleen:  Within normal limits in size and appearance. Adrenals/Urinary Tract: Bilateral renal cysts are noted. No masses identified. No evidence of hydronephrosis. Stomach/Bowel: Visualized portion unremarkable. Vascular/Lymphatic: No pathologically enlarged lymph nodes identified. No abdominal aortic aneurysm. Other:  None. Musculoskeletal:  No suspicious bone lesions identified. IMPRESSION: 1. Diffuse biliary and pancreatic ductal dilatation due to 2 cm lesion in the pancreatic head. This is highly suspicious for pancreatic carcinoma. Consider endoscopic ultrasound for further evaluation. 2. No evidence of abdominal metastatic disease. 3. Small bilateral pleural effusions. Electronically Signed   By: Marlaine Hind M.D.   On: 11/04/2019 18:41        Scheduled Meds: . dorzolamide  1 drop Both Eyes BID  . [START ON 11/07/2019] enoxaparin (LOVENOX) injection  40 mg Subcutaneous Q24H  . feeding supplement (ENSURE ENLIVE)  237 mL Oral BID BM  . gabapentin  100 mg Oral TID  . levothyroxine  50 mcg Oral Daily  . mirtazapine  15 mg Oral QHS  . Netarsudil-Latanoprost  1 drop Both Eyes QHS  . pantoprazole  40 mg Oral Daily  . sodium chloride flush  3 mL Intravenous Q12H   Continuous Infusions: . sodium chloride 75 mL/hr at 11/05/19 0751     LOS: 1 day    Time spent: 25 minutes    Barb Merino, MD [facility] Pager [phone]

## 2019-11-05 NOTE — H&P (View-Only) (Signed)
Daily Rounding Note  11/05/2019, 10:23 AM  LOS: 1 day   SUBJECTIVE:   Chief complaint: Elevated LFTs without obvious jaundice.  Pancreatic mass with dilated biliary tree. Patient feels okay.  She continues to have a mostly dry cough but no dyspnea. Urine is quite dark and she is not urinating at her baseline volume. No pruritus.  No abdominal pain.  Tolerating solid food.  No BM.  OBJECTIVE:         Vital signs in last 24 hours:    Temp:  [97.4 F (36.3 C)-98 F (36.7 C)] 97.7 F (36.5 C) (02/03 0257) Pulse Rate:  [66-90] 69 (02/03 0257) Resp:  [0-22] 16 (02/03 0257) BP: (91-163)/(43-98) 106/43 (02/03 0257) SpO2:  [93 %-100 %] 93 % (02/03 0257) Weight:  [86.2 kg] 86.2 kg (02/03 0912)   Filed Weights   11/05/19 0912  Weight: 86.2 kg   General: Pleasant, cooperative, comfortable, alert Heart: RRR. Chest: Periodic, brief dry cough.  Lungs clear bilaterally.  No labored breathing Abdomen: Soft.  Not tender or distended.  Active bowel sounds. Extremities: No CCE. Neuro/Psych: Alert.  Oriented x3.  Calm.  Moves all 4 limbs, no weakness.  Intake/Output from previous day: 02/02 0701 - 02/03 0700 In: 919.8 [I.V.:919.8] Out: 100 [Urine:100]  Intake/Output this shift: Total I/O In: 360 [P.O.:360] Out: 200 [Urine:200]  Lab Results: Recent Labs    11/03/19 1810 11/04/19 1055 11/05/19 0249  WBC 6.0 5.7 4.8  HGB 11.0* 11.0* 9.8*  HCT 35.1* 35.0* 30.4*  PLT 358 345 208   BMET Recent Labs    11/03/19 1810 11/04/19 1055 11/05/19 0249  NA 135 140 138  K 4.2 3.8 3.8  CL 105 109 109  CO2 21* 20* 20*  GLUCOSE 92 110* 83  BUN 11 11 10   CREATININE 1.29* 1.26* 1.08*  CALCIUM 9.8 10.1 9.5   LFT Recent Labs    11/03/19 1810 11/04/19 1055 11/05/19 0249  PROT 5.0* 5.0* 4.2*  ALBUMIN 2.2* 2.0* 1.8*  AST 319* 274* 220*  ALT 124* 112* 90*  ALKPHOS 1,110* 1,178* 991*  BILITOT 4.9* 5.4* 5.4*  BILIDIR  --   3.6*  --   IBILI  --  1.8*  --    PT/INR Recent Labs    11/03/19 1432 11/04/19 1055  LABPROT 15.5* 15.4*  INR 1.4* 1.2   Hepatitis Panel Recent Labs    11/04/19 1055  HEPBSAG NON REACTIVE  HCVAB NON REACTIVE  HEPAIGM NON REACTIVE  HEPBIGM NON REACTIVE    Studies/Results: CT ABDOMEN PELVIS WO CONTRAST  Result Date: 11/03/2019 CLINICAL DATA:  Abnormal liver labs. EXAM: CT ABDOMEN AND PELVIS WITHOUT CONTRAST TECHNIQUE: Multidetector CT imaging of the abdomen and pelvis was performed following the standard protocol without IV contrast. COMPARISON:  May 01, 2019 FINDINGS: Lower chest: Mild scarring and/or atelectasis is seen within the anterior and posterior aspect of the left lung base. There are small bilateral pleural effusions, left greater than right. Hepatobiliary: No focal liver abnormality is seen. Status post cholecystectomy. There is marked severity intra hepatic biliary dilatation with dilatation of the common bile duct. Pancreas: Unremarkable. No pancreatic ductal dilatation or surrounding inflammatory changes. Spleen: Normal in size without focal abnormality. Adrenals/Urinary Tract: Adrenal glands are unremarkable. Kidneys are normal in size. Multiple stable bilateral renal cysts are seen. The largest measures approximately 3.7 cm in diameter and is located within the posterior aspect of the mid to lower right kidney. 1.9 cm and  0.5 cm nonobstructing renal stones are seen within the right kidney. 0.3 cm and 0.6 cm nonobstructing renal stones are seen within the left kidney. Bladder is unremarkable. Stomach/Bowel: Stomach is within normal limits. The appendix is not identified. Surgically anastomosed bowel is seen within the anterior aspect of the mid to upper abdomen. No evidence of bowel dilatation. Noninflamed diverticula are seen throughout the sigmoid colon. Vascular/Lymphatic: Mild aortic atherosclerosis. No enlarged abdominal or pelvic lymph nodes. Reproductive: A stable 2.0 cm  x 1.5 cm coarse calcification is seen within the posterior aspect of the uterus. The bilateral adnexa are unremarkable. Other: No abdominal wall hernia or abnormality. No abdominopelvic ascites. Musculoskeletal: Bilateral metallic density pedicle screws are seen at the levels of L2, L3 and L4 vertebral bodies. Moderate to marked severity degenerative changes are noted throughout the remainder of the lumbar spine. IMPRESSION: 1. Marked severity intra hepatic biliary dilatation with dilatation of the common bile duct. This represents a new finding when compared to the prior study dated May 01, 2019 and may be related to the patient's post cholecystectomy state, or possibly a distal common bile duct stricture. 2. Bilateral nonobstructing renal stones. 3. Sigmoid diverticulosis without evidence of diverticulitis. 4. Calcified uterine fibroid. 5. Small bilateral pleural effusions, left greater than right. Electronically Signed   By: Virgina Norfolk M.D.   On: 11/03/2019 23:06   MR 3D Recon At Scanner  Result Date: 11/04/2019 CLINICAL DATA:  Painless jaundice. Weight loss. Biliary ductal dilatation is on recent CT. Prior cholecystectomy. EXAM: MRI ABDOMEN WITHOUT AND WITH CONTRAST (INCLUDING MRCP) TECHNIQUE: Multiplanar multisequence MR imaging of the abdomen was performed both before and after the administration of intravenous contrast. Heavily T2-weighted images of the biliary and pancreatic ducts were obtained, and three-dimensional MRCP images were rendered by post processing. CONTRAST:  49mL GADAVIST GADOBUTROL 1 MMOL/ML IV SOLN COMPARISON:  CT on 11/03/2019 and 05/01/2019 FINDINGS: Lower chest: Small bilateral pleural effusions again seen. Hepatobiliary: No hepatic masses identified. Prior cholecystectomy. Severe diffuse biliary ductal dilatation is again seen with common bile duct measuring 31 mm diameter. Stricture of the distal common bile duct is seen in the pancreatic head. No evidence of  choledocholithiasis. Pancreas: Mild diffuse pancreatic ductal dilatation is seen. An ill-defined enhancing lesion with central cystic foci is seen in the pancreatic head, which measures 2.1 x 1.8 cm on image 97/series 1201. This also causes obstruction of the distal common bile duct. This is highly suspicious for pancreatic carcinoma. Spleen:  Within normal limits in size and appearance. Adrenals/Urinary Tract: Bilateral renal cysts are noted. No masses identified. No evidence of hydronephrosis. Stomach/Bowel: Visualized portion unremarkable. Vascular/Lymphatic: No pathologically enlarged lymph nodes identified. No abdominal aortic aneurysm. Other:  None. Musculoskeletal:  No suspicious bone lesions identified. IMPRESSION: 1. Diffuse biliary and pancreatic ductal dilatation due to 2 cm lesion in the pancreatic head. This is highly suspicious for pancreatic carcinoma. Consider endoscopic ultrasound for further evaluation. 2. No evidence of abdominal metastatic disease. 3. Small bilateral pleural effusions. Electronically Signed   By: Marlaine Hind M.D.   On: 11/04/2019 18:41   MR ABDOMEN MRCP W WO CONTAST  Result Date: 11/04/2019 CLINICAL DATA:  Painless jaundice. Weight loss. Biliary ductal dilatation is on recent CT. Prior cholecystectomy. EXAM: MRI ABDOMEN WITHOUT AND WITH CONTRAST (INCLUDING MRCP) TECHNIQUE: Multiplanar multisequence MR imaging of the abdomen was performed both before and after the administration of intravenous contrast. Heavily T2-weighted images of the biliary and pancreatic ducts were obtained, and three-dimensional MRCP images were rendered  by post processing. CONTRAST:  38mL GADAVIST GADOBUTROL 1 MMOL/ML IV SOLN COMPARISON:  CT on 11/03/2019 and 05/01/2019 FINDINGS: Lower chest: Small bilateral pleural effusions again seen. Hepatobiliary: No hepatic masses identified. Prior cholecystectomy. Severe diffuse biliary ductal dilatation is again seen with common bile duct measuring 31 mm  diameter. Stricture of the distal common bile duct is seen in the pancreatic head. No evidence of choledocholithiasis. Pancreas: Mild diffuse pancreatic ductal dilatation is seen. An ill-defined enhancing lesion with central cystic foci is seen in the pancreatic head, which measures 2.1 x 1.8 cm on image 97/series 1201. This also causes obstruction of the distal common bile duct. This is highly suspicious for pancreatic carcinoma. Spleen:  Within normal limits in size and appearance. Adrenals/Urinary Tract: Bilateral renal cysts are noted. No masses identified. No evidence of hydronephrosis. Stomach/Bowel: Visualized portion unremarkable. Vascular/Lymphatic: No pathologically enlarged lymph nodes identified. No abdominal aortic aneurysm. Other:  None. Musculoskeletal:  No suspicious bone lesions identified. IMPRESSION: 1. Diffuse biliary and pancreatic ductal dilatation due to 2 cm lesion in the pancreatic head. This is highly suspicious for pancreatic carcinoma. Consider endoscopic ultrasound for further evaluation. 2. No evidence of abdominal metastatic disease. 3. Small bilateral pleural effusions. Electronically Signed   By: Marlaine Hind M.D.   On: 11/04/2019 18:41   Scheduled Meds:  dorzolamide  1 drop Both Eyes BID   [START ON 11/07/2019] enoxaparin (LOVENOX) injection  40 mg Subcutaneous Q24H   feeding supplement (ENSURE ENLIVE)  237 mL Oral BID BM   gabapentin  100 mg Oral TID   levothyroxine  50 mcg Oral Daily   mirtazapine  15 mg Oral QHS   Netarsudil-Latanoprost  1 drop Both Eyes QHS   pantoprazole  40 mg Oral Daily   [START ON 11/06/2019] predniSONE  50 mg Oral Once   predniSONE  50 mg Oral Once   [START ON 11/06/2019] predniSONE  50 mg Oral Once   sodium chloride flush  3 mL Intravenous Q12H   Continuous Infusions:  sodium chloride 50 mL/hr at 11/05/19 1200   PRN Meds:.albuterol, benzonatate, fluticasone, ondansetron **OR** ondansetron (ZOFRAN) IV, simethicone   ASSESMENT:     *   Elevated LFTs w/o visible jaundice.  Pancreatic mass with dilated common bile duct, intrahepatic ducts and pancreatic duct. 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  MRI/MRCP showing diffuse biliary and pancreatic duct dilatation with 2 cm lesion at head of pancreas suspicious for pancreatic carcinoma.  No evidence of abdominal mets.  LFTS improved in last 48 hours but remain elevated.  INR 1.2.    *  Elevated Lipase.  No sxs or imaging findings to suggest pancreatitis.    *   Duodenal stenosis a/w pyloric gastric ulcer.  S/p 05/2019 gastrojejunostomy (GJ).  No lingering postop obstructive symptoms.  *    CKD, current GFR 46 consistent with stage 3a disease.     *    Normocytic anemia.   No signs of overt GI or other types of bleeding.       PLAN   *   EUS, ERCP 0915 on 2/4.  If unable to access ampulla due to post GJ anatomy, will need perc drain placement/stent per IR.  Risks of bleeding, pancreatitis, sedation, infection, inability to reach ampulla/place stent d/w pt and husband.  Pt is agreeable.  Hold Lovenox until 2/5.  PAS hose in meantime.  NPO after midnight.   preop prednisone for hx contrast induced anaphylaxis.         [name]  11/05/2019,  10:23 AM Phone [phone]

## 2019-11-06 ENCOUNTER — Inpatient Hospital Stay (HOSPITAL_COMMUNITY): Payer: Medicare PPO

## 2019-11-06 ENCOUNTER — Encounter (HOSPITAL_COMMUNITY)
Admission: EM | Disposition: A | Discharge status: Home / Self Care | Payer: Self-pay | Source: Home / Self Care | Attending: Internal Medicine

## 2019-11-06 ENCOUNTER — Encounter (HOSPITAL_COMMUNITY): Payer: Self-pay | Admitting: Internal Medicine

## 2019-11-06 ENCOUNTER — Inpatient Hospital Stay (HOSPITAL_COMMUNITY): Payer: Medicare PPO | Admitting: Anesthesiology

## 2019-11-06 DIAGNOSIS — K858 Other acute pancreatitis without necrosis or infection: Secondary | ICD-10-CM

## 2019-11-06 DIAGNOSIS — C250 Malignant neoplasm of head of pancreas: Secondary | ICD-10-CM

## 2019-11-06 HISTORY — PX: IR BILIARY DRAIN PLACEMENT WITH CHOLANGIOGRAM: IMG6043

## 2019-11-06 HISTORY — PX: BIOPSY: SHX5522

## 2019-11-06 HISTORY — PX: ESOPHAGOGASTRODUODENOSCOPY (EGD) WITH PROPOFOL: SHX5813

## 2019-11-06 HISTORY — PX: FINE NEEDLE ASPIRATION: SHX5430

## 2019-11-06 HISTORY — PX: SUBMUCOSAL TATTOO INJECTION: SHX6856

## 2019-11-06 HISTORY — PX: UPPER ESOPHAGEAL ENDOSCOPIC ULTRASOUND (EUS): SHX6562

## 2019-11-06 LAB — GLUCOSE, CAPILLARY: Glucose-Capillary: 135 mg/dL — ABNORMAL HIGH (ref 70–99)

## 2019-11-06 SURGERY — UPPER ESOPHAGEAL ENDOSCOPIC ULTRASOUND (EUS)
Anesthesia: General

## 2019-11-06 MED ORDER — HYDROMORPHONE HCL 1 MG/ML IJ SOLN
INTRAMUSCULAR | Status: AC | PRN
  Administered 2019-11-06: 0.5 mg via INTRAVENOUS
  Administered 2019-11-06 (×2): .25 mg via INTRAVENOUS
  Administered 2019-11-06: 0.5 mg via INTRAVENOUS

## 2019-11-06 MED ORDER — SUGAMMADEX SODIUM 200 MG/2ML IV SOLN
INTRAVENOUS | Status: DC | PRN
  Administered 2019-11-06: 200 mg via INTRAVENOUS

## 2019-11-06 MED ORDER — IOHEXOL 300 MG/ML  SOLN
50.0000 mL | Freq: Once | INTRAMUSCULAR | Status: AC | PRN
  Administered 2019-11-06: 15:00:00 20 mL

## 2019-11-06 MED ORDER — HYDROMORPHONE HCL 1 MG/ML IJ SOLN
INTRAMUSCULAR | Status: AC
  Filled 2019-11-06: qty 1

## 2019-11-06 MED ORDER — SODIUM CHLORIDE 0.9 % IV SOLN
3.0000 g | Freq: Once | INTRAVENOUS | Status: AC
  Administered 2019-11-06: 3 g via INTRAVENOUS
  Filled 2019-11-06 (×2): qty 8

## 2019-11-06 MED ORDER — LACTATED RINGERS IV SOLN: INTRAVENOUS | Status: DC

## 2019-11-06 MED ORDER — MIDAZOLAM HCL 2 MG/2ML IJ SOLN
INTRAMUSCULAR | Status: AC
  Filled 2019-11-06: qty 4

## 2019-11-06 MED ORDER — LIDOCAINE HCL 1 % IJ SOLN
INTRAMUSCULAR | Status: AC
  Filled 2019-11-06: qty 20

## 2019-11-06 MED ORDER — GLUCAGON HCL RDNA (DIAGNOSTIC) 1 MG IJ SOLR
INTRAMUSCULAR | Status: AC
  Filled 2019-11-06: qty 1

## 2019-11-06 MED ORDER — ONDANSETRON HCL 4 MG/2ML IJ SOLN
INTRAMUSCULAR | Status: DC | PRN
  Administered 2019-11-06: 4 mg via INTRAVENOUS

## 2019-11-06 MED ORDER — LIDOCAINE 2% (20 MG/ML) 5 ML SYRINGE
INTRAMUSCULAR | Status: DC | PRN
  Administered 2019-11-06: 60 mg via INTRAVENOUS

## 2019-11-06 MED ORDER — IOHEXOL 300 MG/ML  SOLN
50.0000 mL | Freq: Once | INTRAMUSCULAR | Status: AC | PRN
  Administered 2019-11-06: 5 mL

## 2019-11-06 MED ORDER — ROCURONIUM BROMIDE 10 MG/ML (PF) SYRINGE
PREFILLED_SYRINGE | INTRAVENOUS | Status: DC | PRN
  Administered 2019-11-06: 40 mg via INTRAVENOUS

## 2019-11-06 MED ORDER — METHYLPREDNISOLONE SODIUM SUCC 125 MG IJ SOLR
INTRAMUSCULAR | Status: AC
  Filled 2019-11-06: qty 2

## 2019-11-06 MED ORDER — PHENYLEPHRINE HCL-NACL 10-0.9 MG/250ML-% IV SOLN
INTRAVENOUS | Status: DC | PRN
  Administered 2019-11-06: 10 ug/min via INTRAVENOUS

## 2019-11-06 MED ORDER — MIDAZOLAM HCL 2 MG/2ML IJ SOLN
INTRAMUSCULAR | Status: AC | PRN
  Administered 2019-11-06 (×2): 0.5 mg via INTRAVENOUS
  Administered 2019-11-06: 1 mg via INTRAVENOUS
  Administered 2019-11-06 (×2): 0.5 mg via INTRAVENOUS

## 2019-11-06 MED ORDER — SPOT INK MARKER SYRINGE KIT
PACK | SUBMUCOSAL | Status: DC | PRN
  Administered 2019-11-06: .5 mL via SUBMUCOSAL
  Administered 2019-11-06: 1.5 mL via SUBMUCOSAL

## 2019-11-06 MED ORDER — SODIUM CHLORIDE 0.9% FLUSH
5.0000 mL | Freq: Two times a day (BID) | INTRAVENOUS | Status: DC
  Administered 2019-11-06 – 2019-11-11 (×9): 5 mL

## 2019-11-06 MED ORDER — PROPOFOL 10 MG/ML IV BOLUS
INTRAVENOUS | Status: DC | PRN
  Administered 2019-11-06: 20 mg via INTRAVENOUS
  Administered 2019-11-06: 125 mg via INTRAVENOUS

## 2019-11-06 MED ORDER — INDOMETHACIN 50 MG RE SUPP: 100.0000 mg | Freq: Once | RECTAL | Status: DC

## 2019-11-06 MED ORDER — LIDOCAINE HCL 1 % IJ SOLN
INTRAMUSCULAR | Status: AC | PRN
  Administered 2019-11-06: 15 mL

## 2019-11-06 MED ORDER — INDOMETHACIN 50 MG RE SUPP
RECTAL | Status: AC
  Filled 2019-11-06: qty 2

## 2019-11-06 MED ORDER — METHYLPREDNISOLONE SODIUM SUCC 125 MG IJ SOLR
125.0000 mg | Freq: Once | INTRAMUSCULAR | Status: AC
  Administered 2019-11-06: 14:00:00 125 mg via INTRAVENOUS

## 2019-11-06 MED ORDER — SODIUM CHLORIDE 0.9 % IV SOLN
3.0000 g | Freq: Once | INTRAVENOUS | Status: AC
  Administered 2019-11-06: 3 g via INTRAVENOUS
  Filled 2019-11-06: qty 3

## 2019-11-06 MED ORDER — SPOT INK MARKER SYRINGE KIT
PACK | SUBMUCOSAL | Status: AC
  Filled 2019-11-06: qty 5

## 2019-11-06 SURGICAL SUPPLY — 15 items

## 2019-11-06 NOTE — Op Note (Signed)
[facility] Patient Name: [name] Procedure Date : 11/06/2019 MRN: [id number] Attending MD: Justice Britain , MD Date of Birth: 1935/05/25 CSN: [id number] Age: 85 Admit Type: Inpatient Procedure:                Upper EUS Indications:              Common bile duct dilation (acquired) seen on MRCP,                            Dilated pancreatic duct on MRCP, Suspected mass in                            pancreas on MRCP, Obstruction of bile duct on MRCP,                            Abnormal liver function test, Suspected solid                            pancreatic neoplasm Providers:                Justice Britain, MD, Glori Bickers, RN, Elspeth Cho Tech., Technician, Theodora Blow, Technician Referring MD:             Mauri Pole, MD, [facility] Medicines:                General Anesthesia, Unasyn 3 g IV Complications:            No immediate complications. Estimated Blood Loss:     Estimated blood loss was minimal. Procedure:                Pre-Anesthesia Assessment:                           - Prior to the procedure, a History and Physical                            was performed, and patient medications and                            allergies were reviewed. The patient's tolerance of                            previous anesthesia was also reviewed. The risks                            and benefits of the procedure and the sedation                            options and risks were discussed with the patient.                            All questions were answered, and informed consent  was obtained. Prior Anticoagulants: The patient has                            taken no previous anticoagulant or antiplatelet                            agents. ASA Grade Assessment: III - A patient with                            severe systemic disease. After reviewing the risks                            and  benefits, the patient was deemed in                            satisfactory condition to undergo the procedure.                           After obtaining informed consent, the endoscope was                            passed under direct vision. Throughout the                            procedure, the patient's blood pressure, pulse, and                            oxygen saturations were monitored continuously. The                            [id number]-H190DL ([id number] Olympus pediatric colonscope                            was introduced through the mouth, and advanced to                            the jejunum. The GF-[id number] ([id number]) Olympus Linear                            EUS scope was introduced through the mouth, and                            advanced to the duodenum for ultrasound examination                            from the stomach and duodenum. The upper EUS was                            technically difficult and complex due to                            post-surgical anatomy and stenosis. Successful  completion of the procedure was aided by performing                            the maneuvers documented (below) in this report.                            The patient tolerated the procedure. Scope In: Scope Out: Findings:      ENDOSCOPIC FINDING: :      No gross lesions were noted in the entire esophagus.      The Z-line was regular and was found 38 cm from the incisors.      Patchy mild inflammation characterized by erosions and erythema was       found in the gastric body and in the gastric antrum.      Evidence of a gastrojejunostomy was found in the gastric body. This was       characterized by healthy appearing mucosa and an intact staple line.      An acquired malignant-appearing, intrinsic severe stenosis was found in       the D1/D2 sweep as previously described leading to [place] bypass. Abnormal       tissue present. Biopsies were taken with a cold  forceps for histology.      The examined presumed efferent jejunum was normal. Area was tattooed       with an injection of Spot (carbon black) to demarcate distal extent.      A medium-sized frond-like/villous and fungating mass with no bleeding       was found in what appeared to be the afferent limb leading to the likely       duodenum. Biopsies were taken with a cold forceps for histology. Area       was tattooed with an injection of Spot (carbon black).      After the EUS was complete, I used fluoroscopy and was able to visualize       the duodenal stenosis and then me being in the likely afferent limb to       the near ampulla. I suspect that the lesion noted above is actually the       ampullary region, but cannot know for sure.      ENDOSONOGRAPHIC FINDING: :      An irregular mass was identified in the pancreatic head. The mass was       hypoechoic. The mass measured 25 mm by 25 mm in maximal cross-sectional       diameter. The endosonographic borders were poorly-defined. There was       sonographic evidence suggesting invasion into the superior mesenteric       vein (manifested by abutment). An intact interface was seen between the       mass and the superior mesenteric artery and celiac trunk suggesting a       lack of invasion. The remainder of the pancreas was examined. The       endosonographic appearance of parenchyma and the upstream pancreatic       duct indicated duct dilation, intraductal debris, not clearly stones was       noted, prominent ductal side-branches in the tail and parenchymal       atrophy throughout. The pancreactic duct in the head measured 2.6 mm ->       3.6 mm. The pancreatic duct in the neck measured 3.9 mm ->  3.1 mm. The       pancreatic duct in the body measured 2.3 mm. The pancreatic duct in the       tail measured 2.0 mm. Fine needle biopsy was performed. Color Doppler       imaging was utilized prior to needle puncture to confirm a lack of        significant vascular structures within the needle path. Five passes were       made with the Acquire 22 gauge ultrasound core biopsy needle using a       transduodenal approach. A visible core of tissue was obtained.       Preliminary cytologic examination and touch preps were performed. Final       cytology results are pending.      There was dilation in the common bile duct (6.4 mm -> 9.3 mm) and in the       common hepatic duct (16.2 mm).      No malignant-appearing lymph nodes were visualized in the celiac region       (level 20), peripancreatic region and porta hepatis region.      A small amount of fluid, visualized as an anechoic feature, was found in       the peritoneal cavity. Not able to get a clear window to sample safely.      There was diffuse abnormal echotexture in the visualized portion of the       liver. This was characterized by a heterogenous appearance but no clear       masses/lesions.      The celiac region was visualized. Impression:               EGD Impression:                           - No gross lesions in esophagus. Z-line regular, 38                            cm from the incisors.                           - Gastritis.                           - A gastrojejunostomy was found, characterized by                            an intact staple line and healthy appearing mucosa.                           - Acquired duodenal stenosis at the D1/D2 region                            with abnormal tissue present. Biopsied.                           - Normal examined efferent jejunum. Tattooed distal                            extent.                           -  Jejunal mass v Duodenal mass in presumed afferent                            limb. Biopsied. Tattooed near lesion.                           - Fluoroscopy suggests this duodenal stenosis and                            the mass noted in the jejunum is likely the ampulla.                           EUS  Impression:                           - A mass was identified in the pancreatic head.                            Tissue was obtained from this exam, and results are                            pending. However, the endosonographic appearance is                            highly suspicious for adenocarcinoma. This was                            staged T2 N0 Mx by endosonographic criteria. The                            staging applies if malignancy is confirmed. Fine                            needle biopsy performed.                           - There was dilation in the common bile duct and in                            the common hepatic duct which measured up to 16 mm.                           - No malignant-appearing lymph nodes were                            visualized in the celiac region (level 20),                            peripancreatic region and porta hepatis region.                           - Ascites was found on endosonographic examination  of the peritoneal cavity.                           - There was diffuse abnormal echotexture in the                            visualized portion of the liver. This was                            characterized by a heterogenous appearance without                            any masses/lesions. Recommendation:           - The patient will be observed post-procedure,                            until all discharge criteria are met.                           - Return patient to hospital ward for ongoing care.                           - Clear liquid diet.                           - Observe patient's clinical course.                           - LFTs and INR in the AM.                           - Consultation to Interventional Radiology for                            PTC/PBD with Biliary brushings, hopefully tomorrow                            (I have already placed order and spoken with IR).                            - Await cytology results and await path results.                           - Send CA19-9 tomorrow.                           - The findings and recommendations were discussed                            with the patient.                           - The findings and recommendations were discussed  with the patient's family.                           - The findings and recommendations were discussed                            with the referring physician. Procedure Code(s):        --- Professional ---                           [phone], Esophagogastroduodenoscopy, flexible,                            transoral; with transendoscopic ultrasound-guided                            intramural or transmural fine needle                            aspiration/biopsy(s), (includes endoscopic                            ultrasound examination limited to the esophagus,                            stomach or duodenum, and adjacent structures) Diagnosis Code(s):        --- Professional ---                           K29.70, Gastritis, unspecified, without bleeding                           Z98.0, Intestinal bypass and anastomosis status                           K31.5, Obstruction of duodenum                           K63.89, Other specified diseases of intestine                           K86.89, Other specified diseases of pancreas                           K83.8, Other specified diseases of biliary tract                           I89.9, Noninfective disorder of lymphatic vessels                            and lymph nodes, unspecified                           R18.8, Other ascites                           K83.1, Obstruction of bile duct  R94.5, Abnormal results of liver function studies                           R93.3, Abnormal findings on diagnostic imaging of                            other parts of digestive tract                           R93.2,  Abnormal findings on diagnostic imaging of                            liver and biliary tract CPT copyright 2019 [employer]. All rights reserved. The codes documented in this report are preliminary and upon coder review may  be revised to meet current compliance requirements. Justice Britain, MD 11/06/2019 11:21:35 AM Number of Addenda: 0

## 2019-11-06 NOTE — Progress Notes (Signed)
Paged GI MD Lyndel Safe on call due to patient refusing to take prednisone po ordered for her procedure in the am. Patient very fearful and anxious. Stated that the last time she took the med for 3 days it caused her to feel shaky and twitchy. Patient had conversation with MD over the phone. MD explained to patient the purpose of the med was to prevent contrast induced anaphylaxis. Patient reassured that it was safe. Patient agrees to take med and will complete doses if no side effects occur. Will continue to monitor.

## 2019-11-06 NOTE — Progress Notes (Signed)
Chief Complaint: Patient was seen in consultation today for obstructive jaundice.   Referring Physician(s): Dr. Rush Landmark   Supervising Physician: Markus Daft  Patient Status: [facility] - In-pt  History of Present Illness: [name] is a 85 y.o. female found to have obstructive jaundice secondary to pancreatic mass. She underwent EUS and attempted ERCP earlier this am. Biopsies were taken but the CBD could not be accessed. Therefore IR is asked to perform PTC with biliary drainage. Imaging and history reviewed. Prior gastrojejunostomy for previous benign stricture. Pt with hx of contrast allergy, was premedicated with Prednisone x 3 regimen last night/this am with last dose at 0800. Husband at bedside. PMHx, meds, labs, imaging, allergies reviewed. Family at bedside.   Past Medical History:  Diagnosis Date  . Allergy   . Anemia   . Anxiety   . Bronchiectasis ([place]) 10/01/2015  . Cataract    removed both eyes  . Depression   . Diabetes mellitus without complication ([place])    diet controlled DM- no meds   . Diverticulosis of colon (without mention of hemorrhage) 07/17/2007  . Gastritis 07/17/2007  . GERD (gastroesophageal reflux disease)   . Glaucoma   . History of fatty infiltration of liver   . Hyperlipemia   . Hyperparathyroidism ([place])   . Hyperparathyroidism, primary ([place]) 01/01/2017  . Hypertension    under control   . Jaundice 11/04/2019  . Lumbosacral root lesions, not elsewhere classified 04/13/2014  . Obese   . Osteoarthritis   . Osteoporosis   . PONV (postoperative nausea and vomiting)    Not in last several surgeries  . Renal calculus, right    severe  . Sleep apnea    uses cpap at night   . Thyroid disease     Past Surgical History:  Procedure Laterality Date  . APPENDECTOMY  1974  . BIOPSY  05/03/2019   Procedure: BIOPSY;  Surgeon: Rush Landmark Telford Nab., MD;  Location: [facility];  Service: Gastroenterology;;  . Conception   LEFT--BENIGN   . CESAREAN SECTION  1974  . CESAREAN SECTION  1971  . CHOLECYSTECTOMY  2000  . COLONOSCOPY  2008  . ESOPHAGOGASTRODUODENOSCOPY  2008  . ESOPHAGOGASTRODUODENOSCOPY (EGD) WITH PROPOFOL N/A 05/03/2019   Procedure: ESOPHAGOGASTRODUODENOSCOPY (EGD) WITH PROPOFOL;  Surgeon: Rush Landmark Telford Nab., MD;  Location: [facility];  Service: Gastroenterology;  Laterality: N/A;  . EYE SURGERY     Lasik, Cataract, Hx of Glaucoma  . GASTROJEJUNOSTOMY N/A 05/06/2019   Procedure: LAPAROSCOPIC GASTROJEJUNOSTOMY and feedling gastrojejunostomy tube;  Surgeon: Michael Boston, MD;  Location: [facility];  Service: General;  Laterality: N/A;  . IR [place] TUBE CHANGE  06/03/2019  . KNEE ARTHROPLASTY  2004   RIGHT-TOTAL   . LUMBAR FUSION  2008  . PARATHYROIDECTOMY  2009   X 2--RIGHT INFERIOR AND LEFT SUPERIOR  . PARATHYROIDECTOMY N/A 01/04/2017   Procedure: RIGHT NECK EXPLORATION WITH PARATHYROIDECTOMY;  Surgeon: Armandina Gemma, MD;  Location: [facility];  Service: General;  Laterality: N/A;  . ROTATOR CUFF REPAIR  2000   RIGHT  . TUBAL LIGATION  1974  . UPPER GASTROINTESTINAL ENDOSCOPY      Allergies: Codeine, Contrast media [iodinated diagnostic agents], Iohexol, Aspirin, and Fentanyl  Medications:  Current Facility-Administered Medications:  .  0.9 %  sodium chloride infusion, , Intravenous, Continuous, Ghimire, Kuber, MD, Last Rate: 50 mL/hr at 11/06/19 1156, New Bag at 11/06/19 1156 .  albuterol (PROVENTIL) (2.5 MG/3ML) 0.083% nebulizer solution 2.5 mg, 2.5 mg, Nebulization, Q6H PRN,  Fuller Plan A, MD .  benzonatate (TESSALON) capsule 100 mg, 100 mg, Oral, TID PRN, Fuller Plan A, MD, 100 mg at 11/05/19 2157 .  dorzolamide (TRUSOPT) 2 % ophthalmic solution 1 drop, 1 drop, Both Eyes, BID, Smith, Rondell A, MD, 1 drop at 11/05/19 2158 .  [START ON 11/07/2019] enoxaparin (LOVENOX) injection 40 mg, 40 mg, Subcutaneous, Q24H, Gribbin, Sarah J, PA-C .  feeding supplement (ENSURE ENLIVE) (ENSURE ENLIVE) liquid  237 mL, 237 mL, Oral, BID BM, Smith, Rondell A, MD, 237 mL at 11/05/19 1736 .  fluticasone (FLONASE) 50 MCG/ACT nasal spray 2 spray, 2 spray, Each Nare, QHS PRN, Smith, Rondell A, MD .  gabapentin (NEURONTIN) capsule 100 mg, 100 mg, Oral, TID, Tamala Julian, Rondell A, MD, 100 mg at 11/05/19 2157 .  indomethacin (INDOCIN) 50 MG suppository 100 mg, 100 mg, Rectal, Once, Gribbin, Sarah J, PA-C .  levothyroxine (SYNTHROID) tablet 50 mcg, 50 mcg, Oral, Daily, Fuller Plan A, MD, 50 mcg at 11/06/19 [phone] .  methylPREDNISolone sodium succinate (SOLU-MEDROL) 125 mg/2 mL injection 125 mg, 125 mg, Intravenous, Once, Anai Lipson, PA-C .  mirtazapine (REMERON) tablet 15 mg, 15 mg, Oral, QHS, Smith, Rondell A, MD, 15 mg at 11/05/19 2157 .  multivitamin with minerals tablet 1 tablet, 1 tablet, Oral, Daily, Barb Merino, MD, 1 tablet at 11/05/19 1736 .  Netarsudil-Latanoprost 0.02-0.005 % SOLN 1 drop, 1 drop, Both Eyes, QHS, Smith, Rondell A, MD .  ondansetron (ZOFRAN) tablet 4 mg, 4 mg, Oral, Q6H PRN **OR** ondansetron (ZOFRAN) injection 4 mg, 4 mg, Intravenous, Q6H PRN, Smith, Rondell A, MD .  pantoprazole (PROTONIX) EC tablet 40 mg, 40 mg, Oral, Daily, Tamala Julian, Rondell A, MD, 40 mg at 11/05/19 0753 .  simethicone (MYLICON) chewable tablet 80 mg, 80 mg, Oral, Q4H PRN, Fuller Plan A, MD, 80 mg at 11/05/19 2202 .  sodium chloride flush (NS) 0.9 % injection 3 mL, 3 mL, Intravenous, Q12H, Smith, Rondell A, MD, 3 mL at 11/05/19 0754    Family History  Problem Relation Age of Onset  . Colon polyps Mother   . Hypertension Mother   . Osteoarthritis Mother   . Diabetes Father   . Stroke Father   . Hernia Father        Esophageal hernia  . Osteoarthritis Maternal Grandmother   . Stroke Maternal Grandfather   . Liver disease Paternal Grandmother   . Stroke Paternal Grandfather   . Colon cancer Neg Hx   . Esophageal cancer Neg Hx   . Rectal cancer Neg Hx   . Stomach cancer Neg Hx     Social History    Socioeconomic History  . Marital status: Married    Spouse name: Not on file  . Number of children: 2  . Years of education: Not on file  . Highest education level: Not on file  Occupational History  . Occupation: retired  Tobacco Use  . Smoking status: Former Smoker    Packs/day: 0.25    Years: 15.00    Pack years: 3.75    Types: Cigarettes    Quit date: 09/01/2014    Years since quitting: 5.1  . Smokeless tobacco: Never Used  Substance and Sexual Activity  . Alcohol use: Yes    Alcohol/week: 1.0 standard drinks    Types: 1 Glasses of wine per week    Comment: occasional  . Drug use: No  . Sexual activity: Not on file  Other Topics Concern  . Not on file  Social History  Narrative   Married, with 2 children   Right handed   Some college   No caffeine      Epworth Sleepiness Scale = 7 (as of 07/21/2015)      Social Determinants of Health   Financial Resource Strain:   . Difficulty of Paying Living Expenses: Not on file  Food Insecurity:   . Worried About Charity fundraiser in the Last Year: Not on file  . Ran Out of Food in the Last Year: Not on file  Transportation Needs:   . Lack of Transportation (Medical): Not on file  . Lack of Transportation (Non-Medical): Not on file  Physical Activity:   . Days of Exercise per Week: Not on file  . Minutes of Exercise per Session: Not on file  Stress:   . Feeling of Stress : Not on file  Social Connections:   . Frequency of Communication with Friends and Family: Not on file  . Frequency of Social Gatherings with Friends and Family: Not on file  . Attends Religious Services: Not on file  . Active Member of Clubs or Organizations: Not on file  . Attends Archivist Meetings: Not on file  . Marital Status: Not on file    Review of Systems: A 12 point ROS discussed and pertinent positives are indicated in the HPI above.  All other systems are negative.  Review of Systems  Vital Signs: BP (!) 117/58 (BP  Location: Right Arm)   Pulse 81   Temp 98.3 F (36.8 C) (Oral)   Resp 16   Ht '5\' 3"'$  (1.6 m)   Wt 86.2 kg   SpO2 97%   BMI 33.66 kg/m   Physical Exam Constitutional:      General: She is not in acute distress.    Appearance: She is obese.  HENT:     Mouth/Throat:     Mouth: Mucous membranes are moist.     Pharynx: Oropharynx is clear.  Cardiovascular:     Rate and Rhythm: Normal rate and regular rhythm.     Heart sounds: Normal heart sounds.  Pulmonary:     Effort: Pulmonary effort is normal. No respiratory distress.     Breath sounds: Normal breath sounds.  Abdominal:     General: There is no distension.     Palpations: Abdomen is soft.     Tenderness: There is no abdominal tenderness.  Skin:    General: Skin is warm and dry.     Coloration: Skin is jaundiced.  Neurological:     General: No focal deficit present.     Mental Status: She is alert.     Imaging: CT ABDOMEN PELVIS WO CONTRAST  Result Date: 11/03/2019 CLINICAL DATA:  Abnormal liver labs. EXAM: CT ABDOMEN AND PELVIS WITHOUT CONTRAST TECHNIQUE: Multidetector CT imaging of the abdomen and pelvis was performed following the standard protocol without IV contrast. COMPARISON:  May 01, 2019 FINDINGS: Lower chest: Mild scarring and/or atelectasis is seen within the anterior and posterior aspect of the left lung base. There are small bilateral pleural effusions, left greater than right. Hepatobiliary: No focal liver abnormality is seen. Status post cholecystectomy. There is marked severity intra hepatic biliary dilatation with dilatation of the common bile duct. Pancreas: Unremarkable. No pancreatic ductal dilatation or surrounding inflammatory changes. Spleen: Normal in size without focal abnormality. Adrenals/Urinary Tract: Adrenal glands are unremarkable. Kidneys are normal in size. Multiple stable bilateral renal cysts are seen. The largest measures approximately 3.7 cm in diameter  and is located within the posterior  aspect of the mid to lower right kidney. 1.9 cm and 0.5 cm nonobstructing renal stones are seen within the right kidney. 0.3 cm and 0.6 cm nonobstructing renal stones are seen within the left kidney. Bladder is unremarkable. Stomach/Bowel: Stomach is within normal limits. The appendix is not identified. Surgically anastomosed bowel is seen within the anterior aspect of the mid to upper abdomen. No evidence of bowel dilatation. Noninflamed diverticula are seen throughout the sigmoid colon. Vascular/Lymphatic: Mild aortic atherosclerosis. No enlarged abdominal or pelvic lymph nodes. Reproductive: A stable 2.0 cm x 1.5 cm coarse calcification is seen within the posterior aspect of the uterus. The bilateral adnexa are unremarkable. Other: No abdominal wall hernia or abnormality. No abdominopelvic ascites. Musculoskeletal: Bilateral metallic density pedicle screws are seen at the levels of L2, L3 and L4 vertebral bodies. Moderate to marked severity degenerative changes are noted throughout the remainder of the lumbar spine. IMPRESSION: 1. Marked severity intra hepatic biliary dilatation with dilatation of the common bile duct. This represents a new finding when compared to the prior study dated May 01, 2019 and may be related to the patient's post cholecystectomy state, or possibly a distal common bile duct stricture. 2. Bilateral nonobstructing renal stones. 3. Sigmoid diverticulosis without evidence of diverticulitis. 4. Calcified uterine fibroid. 5. Small bilateral pleural effusions, left greater than right. Electronically Signed   By: Virgina Norfolk M.D.   On: 11/03/2019 23:06   MR 3D Recon At Scanner  Result Date: 11/04/2019 CLINICAL DATA:  Painless jaundice. Weight loss. Biliary ductal dilatation is on recent CT. Prior cholecystectomy. EXAM: MRI ABDOMEN WITHOUT AND WITH CONTRAST (INCLUDING MRCP) TECHNIQUE: Multiplanar multisequence MR imaging of the abdomen was performed both before and after the  administration of intravenous contrast. Heavily T2-weighted images of the biliary and pancreatic ducts were obtained, and three-dimensional MRCP images were rendered by post processing. CONTRAST:  8m GADAVIST GADOBUTROL 1 MMOL/ML IV SOLN COMPARISON:  CT on 11/03/2019 and 05/01/2019 FINDINGS: Lower chest: Small bilateral pleural effusions again seen. Hepatobiliary: No hepatic masses identified. Prior cholecystectomy. Severe diffuse biliary ductal dilatation is again seen with common bile duct measuring 31 mm diameter. Stricture of the distal common bile duct is seen in the pancreatic head. No evidence of choledocholithiasis. Pancreas: Mild diffuse pancreatic ductal dilatation is seen. An ill-defined enhancing lesion with central cystic foci is seen in the pancreatic head, which measures 2.1 x 1.8 cm on image 97/series 1201. This also causes obstruction of the distal common bile duct. This is highly suspicious for pancreatic carcinoma. Spleen:  Within normal limits in size and appearance. Adrenals/Urinary Tract: Bilateral renal cysts are noted. No masses identified. No evidence of hydronephrosis. Stomach/Bowel: Visualized portion unremarkable. Vascular/Lymphatic: No pathologically enlarged lymph nodes identified. No abdominal aortic aneurysm. Other:  None. Musculoskeletal:  No suspicious bone lesions identified. IMPRESSION: 1. Diffuse biliary and pancreatic ductal dilatation due to 2 cm lesion in the pancreatic head. This is highly suspicious for pancreatic carcinoma. Consider endoscopic ultrasound for further evaluation. 2. No evidence of abdominal metastatic disease. 3. Small bilateral pleural effusions. Electronically Signed   By: JMarlaine HindM.D.   On: 11/04/2019 18:41   DG C-Arm 1-60 Min-No Report  Result Date: 11/06/2019 Fluoroscopy was utilized by the requesting physician.  No radiographic interpretation.   MR ABDOMEN MRCP W WO CONTAST  Result Date: 11/04/2019 CLINICAL DATA:  Painless jaundice. Weight  loss. Biliary ductal dilatation is on recent CT. Prior cholecystectomy. EXAM: MRI ABDOMEN WITHOUT AND  WITH CONTRAST (INCLUDING MRCP) TECHNIQUE: Multiplanar multisequence MR imaging of the abdomen was performed both before and after the administration of intravenous contrast. Heavily T2-weighted images of the biliary and pancreatic ducts were obtained, and three-dimensional MRCP images were rendered by post processing. CONTRAST:  20m GADAVIST GADOBUTROL 1 MMOL/ML IV SOLN COMPARISON:  CT on 11/03/2019 and 05/01/2019 FINDINGS: Lower chest: Small bilateral pleural effusions again seen. Hepatobiliary: No hepatic masses identified. Prior cholecystectomy. Severe diffuse biliary ductal dilatation is again seen with common bile duct measuring 31 mm diameter. Stricture of the distal common bile duct is seen in the pancreatic head. No evidence of choledocholithiasis. Pancreas: Mild diffuse pancreatic ductal dilatation is seen. An ill-defined enhancing lesion with central cystic foci is seen in the pancreatic head, which measures 2.1 x 1.8 cm on image 97/series 1201. This also causes obstruction of the distal common bile duct. This is highly suspicious for pancreatic carcinoma. Spleen:  Within normal limits in size and appearance. Adrenals/Urinary Tract: Bilateral renal cysts are noted. No masses identified. No evidence of hydronephrosis. Stomach/Bowel: Visualized portion unremarkable. Vascular/Lymphatic: No pathologically enlarged lymph nodes identified. No abdominal aortic aneurysm. Other:  None. Musculoskeletal:  No suspicious bone lesions identified. IMPRESSION: 1. Diffuse biliary and pancreatic ductal dilatation due to 2 cm lesion in the pancreatic head. This is highly suspicious for pancreatic carcinoma. Consider endoscopic ultrasound for further evaluation. 2. No evidence of abdominal metastatic disease. 3. Small bilateral pleural effusions. Electronically Signed   By: JMarlaine HindM.D.   On: 11/04/2019 18:41   [place] Abdomen Limited RUQ  Result Date: 10/30/2019 CLINICAL DATA:  Elevated liver function test. History of a cholecystectomy. EXAM: ULTRASOUND ABDOMEN LIMITED RIGHT UPPER QUADRANT COMPARISON:  CT, 05/01/2019 FINDINGS: Gallbladder: Surgically absent Common bile duct: Diameter: Dilated to 2.7 cm tapering to 12 mm near the pancreatic head. This is a significant increase in size compared to the prior CT. No visualized duct stone. Liver: Mild increased echogenicity. Intrahepatic bile duct dilation, not present on the prior CT. Liver normal in overall size. No mass or focal lesion. Portal vein is patent on color Doppler imaging with normal direction of blood flow towards the liver. Other: Pancreatic duct dilated to 3-4 mm. Incidental note of an anterior cyst from the right kidney near the gallbladder fossa. IMPRESSION: 1. Significant intra and extrahepatic bile duct dilation, which is new compared to the prior abdomen and pelvis CT. Pancreatic head not well visualized, nor is the distal common bile duct. Pancreatic duct is dilated. Findings are concerning for an obstructing pancreatic head mass versus a distal common bile duct stone. Further assessment with MRCP or ERCP is recommended. 2. Probable mild hepatic steatosis. Electronically Signed   By: DLajean ManesM.D.   On: 10/30/2019 10:51    Labs:  CBC: Recent Labs    11/03/19 1432 11/03/19 1810 11/04/19 1055 11/05/19 0249  WBC 5.5 6.0 5.7 4.8  HGB 11.1* 11.0* 11.0* 9.8*  HCT 33.3* 35.1* 35.0* 30.4*  PLT 329.0 358 345 208    COAGS: Recent Labs    11/03/19 1432 11/04/19 1055  INR 1.4* 1.2    BMP: Recent Labs    09/04/19 1754 09/04/19 1754 11/03/19 1432 11/03/19 1810 11/04/19 1055 11/05/19 0249  NA 138   < > 137 135 140 138  K 4.0   < > 3.9 4.2 3.8 3.8  CL 104   < > 107 105 109 109  CO2 23   < > 22 21* 20* 20*  GLUCOSE 115*   < >  122* 92 110* 83  BUN 16   < > '12 11 11 10  '$ CALCIUM 10.0   < > 9.7 9.8 10.1 9.5  CREATININE 1.11*   < >  1.10 1.29* 1.26* 1.08*  GFRNONAA 47*  --   --  39* 40* 48*  GFRAA 54*  --   --  45* 46* 55*   < > = values in this interval not displayed.    LIVER FUNCTION TESTS: Recent Labs    11/03/19 1432 11/03/19 1810 11/04/19 1055 11/05/19 0249  BILITOT 4.4* 4.9* 5.4* 5.4*  AST 272* 319* 274* 220*  ALT 111* 124* 112* 90*  ALKPHOS 1,220* 1,110* 1,178* 991*  PROT 4.9* 5.0* 5.0* 4.2*  ALBUMIN 2.6* 2.2* 2.0* 1.8*    TUMOR MARKERS: No results for input(s): AFPTM, CEA, CA199, CHROMGRNA in the last 8760 hours.  Assessment and Plan: Obstructive jaundice Pancreatic mass Plan for PTC with biliary drain placement, may or may not be able to do brush biopsies today. Unasyn this am. D/w pharmacy, will need to give additional IV dose of solumedrol to continue protection for contrast allergy. Labs reviewed. Risks and benefits of PTC and biliary drainage discussed with the patient including, but not limited to bleeding, infection which may lead to sepsis or even death and damage to adjacent structures.  This interventional procedure involves the use of X-rays and because of the nature of the planned procedure, it is possible that we will have prolonged use of X-ray fluoroscopy.  Potential radiation risks to you include (but are not limited to) the following: - A slightly elevated risk for cancer  several years later in life. This risk is typically less than 0.5% percent. This risk is low in comparison to the normal incidence of human cancer, which is 33% for women and 50% for men according to the [employer]. - Radiation induced injury can include skin redness, resembling a rash, tissue breakdown / ulcers and hair loss (which can be temporary or permanent).   The likelihood of either of these occurring depends on the difficulty of the procedure and whether you are sensitive to radiation due to previous procedures, disease, or genetic conditions.   IF your procedure requires a prolonged  use of radiation, you will be notified and given written instructions for further action.  It is your responsibility to monitor the irradiated area for the 2 weeks following the procedure and to notify your physician if you are concerned that you have suffered a radiation induced injury.    All of the patient's questions were answered, patient is agreeable to proceed.  Consent signed and in chart.    Thank you for this interesting consult.  I greatly enjoyed meeting SHEREDA GRAW and look forward to participating in their care.  A copy of this report was sent to the requesting provider on this date.  Electronically Signed: Ascencion Dike, PA-C 11/06/2019, 1:34 PM   I spent a total of 40 minutes in face to face in clinical consultation, greater than 50% of which was counseling/coordinating care for biliary obstruction

## 2019-11-06 NOTE — Interval H&P Note (Signed)
History and Physical Interval Note:  11/06/2019 8:07 AM  [name]  has presented today for surgery, with the diagnosis of Pancreatic mass.  Elevated LFTs..  The various methods of treatment have been discussed with the patient and family. After consideration of risks, benefits and other options for treatment, the patient has consented to  Procedure(s): UPPER ESOPHAGEAL ENDOSCOPIC ULTRASOUND (EUS) (N/A) ENDOSCOPIC RETROGRADE CHOLANGIOPANCREATOGRAPHY (ERCP) (N/A) ESOPHAGOGASTRODUODENOSCOPY (EGD) WITH PROPOFOL (N/A) as a surgical intervention.  The patient's history has been reviewed, patient examined, no change in status, stable for surgery.  I have reviewed the patient's chart and labs.  Questions were answered to the patient's satisfaction.    The risks of EUS including bleeding, infection, aspiration pneumonia and intestinal perforation were discussed as was the possibility it may not give a definitive diagnosis.  If a biopsy of the pancreas is done as part of the EUS, there is an additional risk of pancreatitis at the rate of about 1%.  It was explained that procedure related pancreatitis is typically mild, although can be severe and even life threatening, which is why we do not perform random pancreatic biopsies and only biopsy a lesion we feel is concerning enough to warrant the risk.   The risks of an ERCP were discussed at length, including but not limited to the risk of perforation, bleeding, abdominal pain, post-ERCP pancreatitis (while usually mild can be severe and even life threatening).   The risks and benefits of endoscopic evaluation were discussed with the patient; these include but are not limited to the risk of perforation, infection, bleeding, missed lesions, lack of diagnosis, severe illness requiring hospitalization, as well as anesthesia and sedation related illnesses.  The patient is agreeable to proceed.      [employer]

## 2019-11-06 NOTE — Anesthesia Procedure Notes (Signed)
Procedure Name: Intubation Date/Time: 11/06/2019 8:29 AM Performed by: Alain Marion, CRNA Pre-anesthesia Checklist: Patient identified, Emergency Drugs available, Suction available and Patient being monitored Patient Re-evaluated:Patient Re-evaluated prior to induction Oxygen Delivery Method: Circle System Utilized Preoxygenation: Pre-oxygenation with 100% oxygen Induction Type: IV induction Ventilation: Mask ventilation without difficulty Laryngoscope Size: Miller and 2 Grade View: Grade I Tube type: Oral Tube size: 7.0 mm Number of attempts: 1 Airway Equipment and Method: Stylet and Oral airway Placement Confirmation: ETT inserted through vocal cords under direct vision,  positive ETCO2 and breath sounds checked- equal and bilateral Secured at: 21 cm Tube secured with: Tape Dental Injury: Teeth and Oropharynx as per pre-operative assessment

## 2019-11-06 NOTE — Sedation Documentation (Signed)
Dr. Anselm Pancoast made aware of pt's BP of 88/62.  Pt to be given a fluid bolus.

## 2019-11-06 NOTE — Plan of Care (Signed)
  Problem: Education: Goal: Knowledge of General Education information will improve Description Including pain rating scale, medication(s)/side effects and non-pharmacologic comfort measures Outcome: Progressing   

## 2019-11-06 NOTE — Progress Notes (Signed)
Dr. Sloan Leiter notified urine output 150. Orders received.

## 2019-11-06 NOTE — Progress Notes (Signed)
PROGRESS NOTE    [name]  [id number] DOB: Sep 28, 1935 DOA: 11/04/2019 PCP: Burnard Bunting, MD    Brief Narrative:  85 year old female with history of hypertension, hyperlipidemia, diet-controlled diabetes, hyperparathyroidism status post parathyroidectomy, obesity and sleep apnea.  Recently treated for pneumonia.  History of gastrojejunostomy for a stricture and J-tube placement who was following outpatient and found to have painless jaundice on routine evaluation. She was been evaluated for outpatient MRCP, however repeat lab work showed worsening liver enzymes and was brought to the hospital. In the emergency room, hemodynamically stable.  Bilirubin 5.4.  AST/ALT, 274/112.  CT scan abdomen showed severe intrahepatic and common bile duct dilatation. MRI, MRCP showed diffuse biliary and pancreatic ductal dilation and 2 cm lesion in the pancreatic head.  Assessment & Plan:   Principal Problem:   Painless jaundice Active Problems:   Hypertension   CKD (chronic kidney disease), stage III   Hypothyroid   Idiopathic peripheral neuropathy   Elevated liver enzymes   Pleural effusion, bilateral  Painless jaundice, pancreatic head mass:  Status post ERCP, EUS and pancreatic mass biopsy by GI 2/4  Status post percutaneous biliary drain by interventional radiology 2/4  Continue monitoring.  Maintenance IV fluids. Further management as per diagnostic outcome. Recheck LFTs and electrolytes in the morning.  Hypertension: Blood pressures are stable.  Chronic kidney disease stage IIIb: Stable.  On maintenance fluid.  Recheck in the morning.  Recently treated community-acquired pneumonia: Finished antibiotic therapy.  Stable.  Hypothyroidism: On Synthroid.  Continue.   DVT prophylaxis: SCDs.  Lovenox to restart tonight. Code Status: Full code Family Communication: None Disposition Plan: patient is from home. Anticipated DC to home, Barriers to discharge, on active treatment.   For multiple procedures today.   Consultants:   Gastroenterology  Interventional radiology  Procedures:   None  Antimicrobials:  Anti-infectives (From admission, onward)   Start     Dose/Rate Route Frequency Ordered Stop   11/06/19 1700  Ampicillin-Sulbactam (UNASYN) 3 g in sodium chloride 0.9 % 100 mL IVPB     3 g 200 mL/hr over 30 Minutes Intravenous  Once 11/06/19 1542     11/06/19 0830  Ampicillin-Sulbactam (UNASYN) 3 g in sodium chloride 0.9 % 100 mL IVPB     3 g 200 mL/hr over 30 Minutes Intravenous  Once 11/06/19 0754 11/06/19 0840   11/04/19 2200  cefdinir (OMNICEF) capsule 300 mg    Note to Pharmacy: DS 10     300 mg Oral 2 times daily 11/04/19 1651 11/04/19 2138        Subjective: Patient went for multiple procedures today.  Went to examine patient in the evening.  She was sleepy after sedation.  Husband at the bedside.  Objective: Vitals:   11/06/19 1505 11/06/19 1510 11/06/19 1516 11/06/19 1549  BP: (!) 124/107 128/76 (!) 134/54 (!) 108/56  Pulse: 94 92 95   Resp: 18 14 16 16   Temp:    97.7 F (36.5 C)  TempSrc:    Oral  SpO2: 100% 100% 99% 94%  Weight:      Height:        Intake/Output Summary (Last 24 hours) at 11/06/2019 1614 Last data filed at 11/06/2019 1042 Gross per 24 hour  Intake 2074.2 ml  Output 800 ml  Net 1274.2 ml   Filed Weights   11/05/19 0912  Weight: 86.2 kg    Examination:  General exam: Appears calm and comfortable , icteric. Sleepy on room air. Gastrointestinal system: Abdomen is  nondistended, soft and nontender.  Bile drain with freeflowing bile.  Data Reviewed: I have personally reviewed following labs and imaging studies  CBC: Recent Labs  Lab 11/03/19 1432 11/03/19 1810 11/04/19 1055 11/05/19 0249  WBC 5.5 6.0 5.7 4.8  NEUTROABS 3.7  --  3.2  --   HGB 11.1* 11.0* 11.0* 9.8*  HCT 33.3* 35.1* 35.0* 30.4*  MCV 91.4 95.4 94.3 92.7  PLT 329.0 358 345 [id number]   Basic Metabolic Panel: Recent Labs  Lab  11/03/19 1432 11/03/19 1810 11/04/19 1055 11/05/19 0249  NA 137 135 140 138  K 3.9 4.2 3.8 3.8  CL 107 105 109 109  CO2 22 21* 20* 20*  GLUCOSE 122* 92 110* 83  BUN 12 11 11 10   CREATININE 1.10 1.29* 1.26* 1.08*  CALCIUM 9.7 9.8 10.1 9.5   GFR: Estimated Creatinine Clearance: 41.8 mL/min (A) (by C-G formula based on SCr of 1.08 mg/dL (H)). Liver Function Tests: Recent Labs  Lab 11/03/19 1432 11/03/19 1810 11/04/19 1055 11/05/19 0249  AST 272* 319* 274* 220*  ALT 111* 124* 112* 90*  ALKPHOS 1,220* 1,110* 1,178* 991*  BILITOT 4.4* 4.9* 5.4* 5.4*  PROT 4.9* 5.0* 5.0* 4.2*  ALBUMIN 2.6* 2.2* 2.0* 1.8*   Recent Labs  Lab 11/03/19 1432 11/03/19 1810  LIPASE 504.0* 213*   Recent Labs  Lab 11/04/19 1055  AMMONIA 50*   Coagulation Profile: Recent Labs  Lab 11/03/19 1432 11/04/19 1055  INR 1.4* 1.2   Cardiac Enzymes: No results for input(s): CKTOTAL, CKMB, CKMBINDEX, TROPONINI in the last 168 hours. BNP (last 3 results) No results for input(s): PROBNP in the last 8760 hours. HbA1C: No results for input(s): HGBA1C in the last 72 hours. CBG: Recent Labs  Lab 11/05/19 0017 11/05/19 0332 11/05/19 0724 11/05/19 1142 11/06/19 0751  GLUCAP 82 71 70 76 135*   Lipid Profile: No results for input(s): CHOL, HDL, LDLCALC, TRIG, CHOLHDL, LDLDIRECT in the last 72 hours. Thyroid Function Tests: No results for input(s): TSH, T4TOTAL, FREET4, T3FREE, THYROIDAB in the last 72 hours. Anemia Panel: No results for input(s): VITAMINB12, FOLATE, FERRITIN, TIBC, IRON, RETICCTPCT in the last 72 hours. Sepsis Labs: No results for input(s): PROCALCITON, LATICACIDVEN in the last 168 hours.  Recent Results (from the past 240 hour(s))  Respiratory Panel by RT PCR (Flu A&B, Covid) - Nasopharyngeal Swab     Status: None   Collection Time: 11/04/19 10:50 AM   Specimen: Nasopharyngeal Swab  Result Value Ref Range Status   SARS Coronavirus 2 by RT PCR NEGATIVE NEGATIVE Final     Comment: (NOTE) SARS-CoV-2 target nucleic acids are NOT DETECTED. The SARS-CoV-2 RNA is generally detectable in upper respiratoy specimens during the acute phase of infection. The lowest concentration of SARS-CoV-2 viral copies this assay can detect is 131 copies/mL. A negative result does not preclude SARS-Cov-2 infection and should not be used as the sole basis for treatment or other patient management decisions. A negative result may occur with  improper specimen collection/handling, submission of specimen other than nasopharyngeal swab, presence of viral mutation(s) within the areas targeted by this assay, and inadequate number of viral copies (<131 copies/mL). A negative result must be combined with clinical observations, patient history, and epidemiological information. The expected result is Negative. Fact Sheet for Patients:  PinkCheek.be Fact Sheet for Healthcare Providers:  GravelBags.it This test is not yet ap proved or cleared by the [place] FDA and  has been authorized for detection and/or diagnosis of SARS-CoV-2 by FDA  under an Emergency Use Authorization (EUA). This EUA will remain  in effect (meaning this test can be used) for the duration of the COVID-19 declaration under Section 564(b)(1) of the Act, 21 U.S.C. section 360bbb-3(b)(1), unless the authorization is terminated or revoked sooner.    Influenza A by PCR NEGATIVE NEGATIVE Final   Influenza B by PCR NEGATIVE NEGATIVE Final    Comment: (NOTE) The Xpert Xpress SARS-CoV-2/FLU/RSV assay is intended as an aid in  the diagnosis of influenza from Nasopharyngeal swab specimens and  should not be used as a sole basis for treatment. Nasal washings and  aspirates are unacceptable for Xpert Xpress SARS-CoV-2/FLU/RSV  testing. Fact Sheet for Patients: PinkCheek.be Fact Sheet for Healthcare  Providers: GravelBags.it This test is not yet approved or cleared by the [place] FDA and  has been authorized for detection and/or diagnosis of SARS-CoV-2 by  FDA under an Emergency Use Authorization (EUA). This EUA will remain  in effect (meaning this test can be used) for the duration of the  Covid-19 declaration under Section 564(b)(1) of the Act, 21  U.S.C. section 360bbb-3(b)(1), unless the authorization is  terminated or revoked. Performed at [facility], [place] [address], [place], [place] [address]          Radiology Studies: MR 3D Recon At Scanner  Result Date: 11/04/2019 CLINICAL DATA:  Painless jaundice. Weight loss. Biliary ductal dilatation is on recent CT. Prior cholecystectomy. EXAM: MRI ABDOMEN WITHOUT AND WITH CONTRAST (INCLUDING MRCP) TECHNIQUE: Multiplanar multisequence MR imaging of the abdomen was performed both before and after the administration of intravenous contrast. Heavily T2-weighted images of the biliary and pancreatic ducts were obtained, and three-dimensional MRCP images were rendered by post processing. CONTRAST:  44mL GADAVIST GADOBUTROL 1 MMOL/ML IV SOLN COMPARISON:  CT on 11/03/2019 and 05/01/2019 FINDINGS: Lower chest: Small bilateral pleural effusions again seen. Hepatobiliary: No hepatic masses identified. Prior cholecystectomy. Severe diffuse biliary ductal dilatation is again seen with common bile duct measuring 31 mm diameter. Stricture of the distal common bile duct is seen in the pancreatic head. No evidence of choledocholithiasis. Pancreas: Mild diffuse pancreatic ductal dilatation is seen. An ill-defined enhancing lesion with central cystic foci is seen in the pancreatic head, which measures 2.1 x 1.8 cm on image 97/series 1201. This also causes obstruction of the distal common bile duct. This is highly suspicious for pancreatic carcinoma. Spleen:  Within normal limits in size and appearance. Adrenals/Urinary  Tract: Bilateral renal cysts are noted. No masses identified. No evidence of hydronephrosis. Stomach/Bowel: Visualized portion unremarkable. Vascular/Lymphatic: No pathologically enlarged lymph nodes identified. No abdominal aortic aneurysm. Other:  None. Musculoskeletal:  No suspicious bone lesions identified. IMPRESSION: 1. Diffuse biliary and pancreatic ductal dilatation due to 2 cm lesion in the pancreatic head. This is highly suspicious for pancreatic carcinoma. Consider endoscopic ultrasound for further evaluation. 2. No evidence of abdominal metastatic disease. 3. Small bilateral pleural effusions. Electronically Signed   By: Marlaine Hind M.D.   On: 11/04/2019 18:41   DG C-Arm 1-60 Min-No Report  Result Date: 11/06/2019 Fluoroscopy was utilized by the requesting physician.  No radiographic interpretation.   MR ABDOMEN MRCP W WO CONTAST  Result Date: 11/04/2019 CLINICAL DATA:  Painless jaundice. Weight loss. Biliary ductal dilatation is on recent CT. Prior cholecystectomy. EXAM: MRI ABDOMEN WITHOUT AND WITH CONTRAST (INCLUDING MRCP) TECHNIQUE: Multiplanar multisequence MR imaging of the abdomen was performed both before and after the administration of intravenous contrast. Heavily T2-weighted images of the biliary and pancreatic  ducts were obtained, and three-dimensional MRCP images were rendered by post processing. CONTRAST:  94mL GADAVIST GADOBUTROL 1 MMOL/ML IV SOLN COMPARISON:  CT on 11/03/2019 and 05/01/2019 FINDINGS: Lower chest: Small bilateral pleural effusions again seen. Hepatobiliary: No hepatic masses identified. Prior cholecystectomy. Severe diffuse biliary ductal dilatation is again seen with common bile duct measuring 31 mm diameter. Stricture of the distal common bile duct is seen in the pancreatic head. No evidence of choledocholithiasis. Pancreas: Mild diffuse pancreatic ductal dilatation is seen. An ill-defined enhancing lesion with central cystic foci is seen in the pancreatic head,  which measures 2.1 x 1.8 cm on image 97/series 1201. This also causes obstruction of the distal common bile duct. This is highly suspicious for pancreatic carcinoma. Spleen:  Within normal limits in size and appearance. Adrenals/Urinary Tract: Bilateral renal cysts are noted. No masses identified. No evidence of hydronephrosis. Stomach/Bowel: Visualized portion unremarkable. Vascular/Lymphatic: No pathologically enlarged lymph nodes identified. No abdominal aortic aneurysm. Other:  None. Musculoskeletal:  No suspicious bone lesions identified. IMPRESSION: 1. Diffuse biliary and pancreatic ductal dilatation due to 2 cm lesion in the pancreatic head. This is highly suspicious for pancreatic carcinoma. Consider endoscopic ultrasound for further evaluation. 2. No evidence of abdominal metastatic disease. 3. Small bilateral pleural effusions. Electronically Signed   By: Marlaine Hind M.D.   On: 11/04/2019 18:41        Scheduled Meds: . dorzolamide  1 drop Both Eyes BID  . [START ON 11/07/2019] enoxaparin (LOVENOX) injection  40 mg Subcutaneous Q24H  . feeding supplement (ENSURE ENLIVE)  237 mL Oral BID BM  . gabapentin  100 mg Oral TID  . indomethacin  100 mg Rectal Once  . levothyroxine  50 mcg Oral Daily  . mirtazapine  15 mg Oral QHS  . multivitamin with minerals  1 tablet Oral Daily  . Netarsudil-Latanoprost  1 drop Both Eyes QHS  . pantoprazole  40 mg Oral Daily  . sodium chloride flush  3 mL Intravenous Q12H  . sodium chloride flush  5 mL Intracatheter Q12H   Continuous Infusions: . sodium chloride 50 mL/hr at 11/06/19 1156  . ampicillin-sulbactam (UNASYN) IV       LOS: 2 days    Time spent: 25 minutes    Barb Merino, MD [facility] Pager [phone]

## 2019-11-06 NOTE — Procedures (Signed)
Interventional Radiology Procedure:   Indications:  Biliary obstruction.  Pancreatic lesion.  Procedure: Percutaneous cholangiogram with placement of EXTERNAL biliary drain.  Findings: Severe biliary dilatation with obstruction in distal CBD.  Able to pass a wire past the obstruction but drain would not pass the obstruction and patient would not tolerate biliary dilatation or additional drain manipulation.  10 Fr biliary drain tip in distal CBD.  Complications: None     EBL: less than 20 ml  Plan: Send bile for cytology and culture.  Will need conversion to internal - external biliary drain in a few days after the biliary system has decompressed. We can try a biopsy at that time.     Anthoney Sheppard R. Anselm Pancoast, MD  Pager: [phone]

## 2019-11-06 NOTE — Anesthesia Postprocedure Evaluation (Signed)
Anesthesia Post Note  Patient: [name]  Procedure(s) Performed: UPPER ESOPHAGEAL ENDOSCOPIC ULTRASOUND (EUS) (N/A ) ESOPHAGOGASTRODUODENOSCOPY (EGD) WITH PROPOFOL (N/A ) BIOPSY SUBMUCOSAL TATTOO INJECTION FINE NEEDLE ASPIRATION (FNA) LINEAR     Patient location during evaluation: PACU Anesthesia Type: General Level of consciousness: awake Pain management: pain level controlled Vital Signs Assessment: post-procedure vital signs reviewed and stable Respiratory status: spontaneous breathing, nonlabored ventilation, respiratory function stable and patient connected to nasal cannula oxygen Cardiovascular status: blood pressure returned to baseline and stable Postop Assessment: no apparent nausea or vomiting Anesthetic complications: no    Last Vitals:  Vitals:   11/06/19 1640 11/06/19 2058  BP: (!) 101/46 (!) 105/22  Pulse: 78 83  Resp: 14 18  Temp: 36.6 C 36.6 C  SpO2: 93% 92%    Last Pain:  Vitals:   11/06/19 2058  TempSrc: Oral  PainSc:                  Karyl Kinnier Earnstine Meinders

## 2019-11-06 NOTE — Anesthesia Preprocedure Evaluation (Addendum)
Anesthesia Evaluation  Patient identified by MRN, date of birth, ID band Patient awake    Reviewed: Allergy & Precautions, NPO status , Patient's Chart, lab work & pertinent test results  History of Anesthesia Complications (+) PONV and history of anesthetic complications  Airway Mallampati: III  TM Distance: >3 FB Neck ROM: Full    Dental no notable dental hx.    Pulmonary asthma , sleep apnea and Continuous Positive Airway Pressure Ventilation , former smoker,    Pulmonary exam normal breath sounds clear to auscultation       Cardiovascular hypertension, Pt. on medications Normal cardiovascular exam Rhythm:Regular Rate:Normal  ECG: SR, rate 71   Neuro/Psych PSYCHIATRIC DISORDERS Anxiety Depression negative neurological ROS     GI/Hepatic Neg liver ROS, GERD  Medicated,  Endo/Other  diabetesHypothyroidism   Renal/GU negative Renal ROS     Musculoskeletal Jaundice   Abdominal (+) + obese,   Peds  Hematology  (+) anemia , HLD   Anesthesia Other Findings Pancreatic mass.  Elevated LFTs.  Reproductive/Obstetrics                           Anesthesia Physical Anesthesia Plan  ASA: III  Anesthesia Plan: General   Post-op Pain Management:    Induction: Intravenous  PONV Risk Score and Plan: 4 or greater and Ondansetron, Dexamethasone and Treatment may vary due to age or medical condition  Airway Management Planned: Oral ETT  Additional Equipment:   Intra-op Plan:   Post-operative Plan: Extubation in OR  Informed Consent: I have reviewed the patients History and Physical, chart, labs and discussed the procedure including the risks, benefits and alternatives for the proposed anesthesia with the patient or authorized representative who has indicated his/her understanding and acceptance.     Dental advisory given  Plan Discussed with: CRNA  Anesthesia Plan Comments:         Anesthesia Quick Evaluation

## 2019-11-06 NOTE — Transfer of Care (Signed)
Immediate Anesthesia Transfer of Care Note  Patient: [name]  Procedure(s) Performed: UPPER ESOPHAGEAL ENDOSCOPIC ULTRASOUND (EUS) (N/A ) ESOPHAGOGASTRODUODENOSCOPY (EGD) WITH PROPOFOL (N/A ) BIOPSY SUBMUCOSAL TATTOO INJECTION FINE NEEDLE ASPIRATION (FNA) LINEAR  Patient Location: PACU, Endo  Anesthesia Type:General  Level of Consciousness: awake, alert  and oriented  Airway & Oxygen Therapy: Patient Spontanous Breathing and Patient connected to face mask oxygen  Post-op Assessment: Report given to RN and Post -op Vital signs reviewed and stable  Post vital signs: Reviewed and stable  Last Vitals:  Vitals Value Taken Time  BP 132/50 11/06/19 1059  Temp 36.9 C 11/06/19 1059  Pulse 92 11/06/19 1105  Resp 19 11/06/19 1105  SpO2 96 % 11/06/19 1105  Vitals shown include unvalidated device data.  Last Pain:  Vitals:   11/06/19 1059  TempSrc: Axillary  PainSc:          Complications: No apparent anesthesia complications

## 2019-11-07 DIAGNOSIS — R748 Abnormal levels of other serum enzymes: Secondary | ICD-10-CM

## 2019-11-07 DIAGNOSIS — K831 Obstruction of bile duct: Secondary | ICD-10-CM

## 2019-11-07 DIAGNOSIS — K8689 Other specified diseases of pancreas: Secondary | ICD-10-CM

## 2019-11-07 LAB — CBC WITH DIFFERENTIAL/PLATELET
Abs Immature Granulocytes: 0.05 10*3/uL (ref 0.00–0.07)
Basophils Absolute: 0 10*3/uL (ref 0.0–0.1)
Basophils Relative: 0 %
Eosinophils Absolute: 0 10*3/uL (ref 0.0–0.5)
Eosinophils Relative: 0 %
HCT: 31.5 % — ABNORMAL LOW (ref 36.0–46.0)
Hemoglobin: 10.3 g/dL — ABNORMAL LOW (ref 12.0–15.0)
Immature Granulocytes: 1 %
Lymphocytes Relative: 10 %
Lymphs Abs: 0.8 10*3/uL (ref 0.7–4.0)
MCH: 29.8 pg (ref 26.0–34.0)
MCHC: 32.7 g/dL (ref 30.0–36.0)
MCV: 91 fL (ref 80.0–100.0)
Monocytes Absolute: 0.2 10*3/uL (ref 0.1–1.0)
Monocytes Relative: 2 %
Neutro Abs: 7.4 10*3/uL (ref 1.7–7.7)
Neutrophils Relative %: 87 %
Platelets: 335 10*3/uL (ref 150–400)
RBC: 3.46 MIL/uL — ABNORMAL LOW (ref 3.87–5.11)
RDW: 17.1 % — ABNORMAL HIGH (ref 11.5–15.5)
WBC: 8.5 10*3/uL (ref 4.0–10.5)
nRBC: 0 % (ref 0.0–0.2)

## 2019-11-07 LAB — CYTOLOGY - NON PAP

## 2019-11-07 LAB — COMPREHENSIVE METABOLIC PANEL
ALT: 86 U/L — ABNORMAL HIGH (ref 0–44)
AST: 149 U/L — ABNORMAL HIGH (ref 15–41)
Albumin: 2 g/dL — ABNORMAL LOW (ref 3.5–5.0)
Alkaline Phosphatase: 1033 U/L — ABNORMAL HIGH (ref 38–126)
Anion gap: 7 (ref 5–15)
BUN: 15 mg/dL (ref 8–23)
CO2: 21 mmol/L — ABNORMAL LOW (ref 22–32)
Calcium: 9.6 mg/dL (ref 8.9–10.3)
Chloride: 109 mmol/L (ref 98–111)
Creatinine, Ser: 1.18 mg/dL — ABNORMAL HIGH (ref 0.44–1.00)
GFR calc Af Amer: 50 mL/min — ABNORMAL LOW (ref >60)
GFR calc non Af Amer: 43 mL/min — ABNORMAL LOW (ref >60)
Glucose, Bld: 147 mg/dL — ABNORMAL HIGH (ref 70–99)
Potassium: 4.7 mmol/L (ref 3.5–5.1)
Sodium: 137 mmol/L (ref 135–145)
Total Bilirubin: 3 mg/dL — ABNORMAL HIGH (ref 0.3–1.2)
Total Protein: 4.7 g/dL — ABNORMAL LOW (ref 6.5–8.1)

## 2019-11-07 LAB — CANCER ANTIGEN 19-9: CA 19-9: 493 U/mL — ABNORMAL HIGH (ref 0–35)

## 2019-11-07 LAB — MAGNESIUM: Magnesium: 1.6 mg/dL — ABNORMAL LOW (ref 1.7–2.4)

## 2019-11-07 LAB — PHOSPHORUS: Phosphorus: 3.5 mg/dL (ref 2.5–4.6)

## 2019-11-07 MED ORDER — PREDNISONE 50 MG PO TABS
50.0000 mg | ORAL_TABLET | Freq: Four times a day (QID) | ORAL | Status: AC
  Administered 2019-11-07 – 2019-11-08 (×3): 50 mg via ORAL
  Filled 2019-11-07 (×3): qty 1

## 2019-11-07 MED ORDER — DIPHENHYDRAMINE HCL 50 MG/ML IJ SOLN: 50.0000 mg | Freq: Once | INTRAMUSCULAR | Status: AC

## 2019-11-07 MED ORDER — DIPHENHYDRAMINE HCL 25 MG PO CAPS
50.0000 mg | ORAL_CAPSULE | Freq: Once | ORAL | Status: AC
  Administered 2019-11-08: 08:00:00 50 mg via ORAL
  Filled 2019-11-07: qty 2

## 2019-11-07 MED ORDER — ENOXAPARIN SODIUM 40 MG/0.4ML ~~LOC~~ SOLN
40.0000 mg | SUBCUTANEOUS | Status: DC
  Administered 2019-11-09 – 2019-11-10 (×2): 40 mg via SUBCUTANEOUS
  Filled 2019-11-07 (×2): qty 0.4

## 2019-11-07 MED ORDER — MAGNESIUM SULFATE 2 GM/50ML IV SOLN
2.0000 g | Freq: Once | INTRAVENOUS | Status: AC
  Administered 2019-11-07: 2 g via INTRAVENOUS
  Filled 2019-11-07: qty 50

## 2019-11-07 NOTE — Progress Notes (Addendum)
        Daily Rounding Note  11/07/2019, 8:27 AM  LOS: 3 days   SUBJECTIVE:   Chief complaint:  Pancreatic mass causing biliary obstruction.       Patient denies abdominal pain.  Appetite fair. 330 mL bile drainage recorded yesterday. Paroxysmal coughing continues but does not feel short of breath. C/O swelling in her feet with her daily furosemide is on hold  OBJECTIVE:         Vital signs in last 24 hours:    Temp:  [97.4 F (36.3 C)-98.4 F (36.9 C)] 97.6 F (36.4 C) (02/05 0551) Pulse Rate:  [76-96] 76 (02/05 0551) Resp:  [12-18] 18 (02/05 0551) BP: (84-150)/(22-107) 109/58 (02/05 0551) SpO2:  [92 %-100 %] 95 % (02/05 0551)   Filed Weights   11/05/19 0912  Weight: 86.2 kg   General: Alert.  Coughing.  Looks moderately unwell.  No obvious jaundice. Heart: RRR. Chest: Clear bilaterally but loose cough at which point she seems dyspneic. Abdomen: Soft.  Biliary drain with about 90 cc of dark bile.  No tenderness.  Active bowel sounds. Extremities: Nonpitting left greater than right lower extremity edema. Neuro/Psych: Alert.  Oriented x3.  Fluent speech.  Good spirits.  No tremors.  Intake/Output from previous day: 02/04 0701 - 02/05 0700 In: 1400 [I.V.:1300; IV Piggyback:100] Out: 730 [Urine:400; Drains:330]  Intake/Output this shift: No intake/output data recorded.  Lab Results: Recent Labs    11/04/19 1055 11/05/19 0249 11/07/19 0231  WBC 5.7 4.8 8.5  HGB 11.0* 9.8* 10.3*  HCT 35.0* 30.4* 31.5*  PLT 345 208 335   BMET Recent Labs    11/04/19 1055 11/05/19 0249 11/07/19 0231  NA 140 138 137  K 3.8 3.8 4.7  CL 109 109 109  CO2 20* 20* 21*  GLUCOSE 110* 83 147*  BUN 11 10 15  CREATININE 1.26* 1.08* 1.18*  CALCIUM 10.1 9.5 9.6   LFT Recent Labs    11/04/19 1055 11/05/19 0249 11/07/19 0231  PROT 5.0* 4.2* 4.7*  ALBUMIN 2.0* 1.8* 2.0*  AST 274* 220* 149*  ALT 112* 90* 86*  ALKPHOS 1,178*  991* 1,033*  BILITOT 5.4* 5.4* 3.0*  BILIDIR 3.6*  --   --   IBILI 1.8*  --   --    PT/INR Recent Labs    11/04/19 1055  LABPROT 15.4*  INR 1.2   Hepatitis Panel Recent Labs    11/04/19 1055  HEPBSAG NON REACTIVE  HCVAB NON REACTIVE  HEPAIGM NON REACTIVE  HEPBIGM NON REACTIVE    Studies/Results: DG C-Arm 1-60 Min-No Report  Result Date: 11/06/2019 Fluoroscopy was utilized by the requesting physician.  No radiographic interpretation.   IR BILIARY DRAIN PLACEMENT WITH CHOLANGIOGRAM  Result Date: 11/06/2019 INDICATION: 84-year-old with biliary obstruction and pancreatic lesion. Biliary system could not be decompressed with ERCP. History of gastrojejunostomy. Patient presents for percutaneous transhepatic cholangiogram and biliary drain placement. EXAM: PERCUTANEOUS TRANSHEPATIC CHOLANGIOGRAM WITH ULTRASOUND AND FLUOROSCOPIC GUIDANCE PLACEMENT OF EXTERNAL BILIARY DRAIN MEDICATIONS: 125 mg Solu-Medrol. Steroid was given due to contrast allergy. Patient was pre-medicated earlier in the day for the ERCP study. ANESTHESIA/SEDATION: Moderate (conscious) sedation was employed during this procedure. A total of Versed 3.0 mg and Dilaudid 2.0 mg was administered intravenously. Moderate Sedation Time: 64 minutes. The patient's level of consciousness and vital signs were monitored continuously by radiology nursing throughout the procedure under my direct supervision. FLUOROSCOPY TIME:  Fluoroscopy Time: 10 minutes, 30 seconds, 107 mGy CONTRAST:  25   mL Omnipaque [id number] system COMPLICATIONS: None immediate. PROCEDURE: Informed written consent was obtained from the patient after a thorough discussion of the procedural risks, benefits and alternatives. All questions were addressed. Maximal Sterile Barrier Technique was utilized including caps, mask, sterile gowns, sterile gloves, sterile drape, hand hygiene and skin antiseptic. A timeout was performed prior to the initiation of the procedure. The  abdomen was prepped and draped in sterile fashion. Ultrasound was used to evaluate the liver. Left lateral hepatic lobe was targeted for percutaneous access. The anterior abdomen was prepped with 1% lidocaine. Using ultrasound guidance, 21 gauge needle was directed into a dilated peripheral bile duct. Contrast injection confirmed placement in the biliary system. 0.018 wire was advanced and a transitional dilator set was placed. Five French catheter and Glidewire were advanced to the distal common bile duct. Additional cholangiograms were performed. Eventually, the Glidewire was able to pass the obstruction in the distal common bile duct. Catheter was advanced into the duodenum with difficulty. Tortuosity of the distal common bile duct made access into the duodenum difficult. A stiff Glidewire was placed. The tract was dilated and a 10.2 [place] biliary drain was advanced over the wire. The drain easily advanced into the liver but would not advance across the distal biliary obstruction. At this point, the patient was very uncomfortable and moving on the table. I was unable to exchange the 10 [place] biliary tube for a smaller tube or try to dilate the obstruction due to patient's discomfort. Therefore, the catheter was advanced into the biliary system and the tip was placed in the distal common bile duct. Contrast injection confirmed that the tube was well positioned within the biliary system. Bile was aspirated. Sample of the bile was sent for culture and cytology. Catheter was sutured to skin and attached to a gravity bag. Dressing was placed over the tube. FINDINGS: Severe intrahepatic and extrahepatic biliary dilatation. A dilated peripheral bile duct was accessed in the lateral left hepatic lobe. There was complete obstruction in the distal common bile duct. Catheter and wire were advanced across the obstruction but the biliary drain could not be advanced due to patient's discomfort. IMPRESSION: 1. Severe  intrahepatic and extrahepatic biliary dilatation due to obstruction in the distal common bile duct. 2. Successful placement of an external biliary drain. Catheter tip in the distal common bile duct. 3. Will plan to convert the external biliary drain to an internal-external biliary drain in the next few days. If the patient still needs a tissue diagnosis, we may be able to perform brush biopsies at the same time. Electronically Signed   By: Markus Daft M.D.   On: 11/06/2019 17:52    Scheduled Meds: . dorzolamide  1 drop Both Eyes BID  . enoxaparin (LOVENOX) injection  40 mg Subcutaneous Q24H  . feeding supplement (ENSURE ENLIVE)  237 mL Oral BID BM  . gabapentin  100 mg Oral TID  . indomethacin  100 mg Rectal Once  . levothyroxine  50 mcg Oral Daily  . mirtazapine  15 mg Oral QHS  . multivitamin with minerals  1 tablet Oral Daily  . Netarsudil-Latanoprost  1 drop Both Eyes QHS  . pantoprazole  40 mg Oral Daily  . sodium chloride flush  3 mL Intravenous Q12H  . sodium chloride flush  5 mL Intracatheter Q12H   Continuous Infusions: . sodium chloride 100 mL/hr at 11/07/19 0125   PRN Meds:.albuterol, benzonatate, fluticasone, ondansetron **OR** ondansetron (ZOFRAN) IV, simethicone   ASSESMENT:   *  Pancreatic mass, obstructing CBD w elevated LFTs EUS 11/07/19: Gastritis, healthy GJ anastomosis.  Biopsy of duodenal stenosis.  Normal efferent jejunal limb, distal extent of exam tattooed.  Jejunal v duodenal mass in what is presumed afferent limb.  This was biopsied and tattoo placed near the lesion.  Fluoroscopy suggests this is duodenal stenosis and mass in the jejunum is likely at the ampulla.  Pancreatic head mass, this was FNA biopsied.  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  CBD dilatatation and CHD measuring up to 16 mm.  Nonmalignant appearing celiac, peripancreatic and porta hepatis region lymph nodes.  Peritoneal ascites.  Diffuse, heterogeneous, abnormal liver  texture but no distinct liver masses. Unable to perform ERCP and place stent 11/06/19 Dr. Henn performed PERC cholangiogram and placed external biliary drain. LFTs, w exception of alk phos, continue to improve. CA 19-9 493.  *    Lipase elevation without clinical or radiographic evidence of pancreatitis.  *    Normocytic anemia.  *    Coagulopathy, resolved.  INR 1.4 >> 1.2.  *   CKD.    PLAN   *   Awaiting results of FNA biopsy/cytology.  *   IR planning eventual version from external to internal biliary drain in the next few days post biliary tree decompressed.  If necessary (i.e. if pndg cytopath inconclusive) can obtain biopsy as well.    Sarah Gribbin  11/07/2019, 8:27 AM Phone [phone] 

## 2019-11-07 NOTE — Progress Notes (Addendum)
Nutrition Follow-up  DOCUMENTATION CODES:   Obesity unspecified, Non-severe (moderate) malnutrition in context of chronic illness  INTERVENTION:   -Continue Ensure Enlive po BID, each supplement provides 350 kcal and 20 grams of protein -Continue MVI with minerals daily -Magic cup TID with meals, each supplement provides 290 kcal and 9 grams of protein  NUTRITION DIAGNOSIS:   Moderate Malnutrition related to chronic illness(pancreatic mass with possible malignancy) as evidenced by energy intake < 75% for > or equal to 1 month, mild fat depletion, mild muscle depletion, percent weight loss.  Ongoing  GOAL:   Patient will meet greater than or equal to 90% of their needs  Progressing   MONITOR:   PO intake, Supplement acceptance, Labs, Weight trends, Skin, I & O's  REASON FOR ASSESSMENT:   Malnutrition Screening Tool    ASSESSMENT:   [name] is a 85 y.o. female with medical history significant of hypertension, hyperlipidemia, diet-controlled diabetes mellitus type 2, hyperparathyroidism s/p parathyroidectomy, hypothyroidism, obesity, and sleep apnea.  Presents after being found to have elevated liver enzymes.  She had been referred to Dr. Fleeta Emmer and had a televisit on 1/29, after her primary care provider had obtained routine lab work.  Denies having any fever, chills, abdominal pain, nausea, vomiting, or change in stool.  She does endorse a intermittent cough with some mild shortness of breath, but reports that she is just recently getting over pneumonia for which she is spending shooting up a 10-day course of antibiotics.  Plan was for patient to be scheduled to have outpatient MRCP, but repeat lab work showed worsening liver enzymes for which she was advised to come to the hospital.  Patient reports that she is previously had her gallbladder removed.  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 Biopsied; Jejunal mass v Duodenal mass in presumed afferent llimb. Biopsied. Tattooed near lesion; Fluoroscopy suggests this duodenal stenosis; A mass was identified in the pancreatic head.  Tissue was obtained from this exam, and results are pending. However, the endosonographic appearance is highly suspicious for adenocarcinoma. 6 mm; s/p percutaneous cholangiogram with placement of external biliary drain                           Reviewed I/O's: +670 ml x 24 hours and +2.7 L since admission  UOP: 400 ml x 24 hours  Drain output: 330 ml x 24 hours  Spoke with pt at bedside, who was pleasant and in good spirits today. She remains positive after EGD and drain placement yesterday. Per GI notes, awaiting biopsy results to further direct care.   Pt endorses a general decline in health over the past 6 months, when she was hospitalized for a prior surgery. Pr shares that her appetite was so poor during that admission that she had to receive nutrition through a feeding tube during that time. Pt reports that appetite continued to be poor after feeding tube removal. Pt was put on a appetite stimulant approximately 3 weeks ago and has notice improvement in her oral intake since then.   Pt reports intake has vastly improved since hospitalization and confirmed that she was consuming 50-75% of meals. Pt also consuming Ensure supplements and states that she was supplementing her diet with them at home PTA.   She reports UBW is around 230# which  she last weighed about 6 months ago prior to her last surgery. She estimates she has lost about 30# since that time period. Pt has experienced a 13% wt loss over the past 5 months, which is significant for time frame. Pt states she has gained some weight, but is suspicious that this is related to fluid retention, as she was recently taken off her blood pressure and diuretic  medications.   Discussed importance of good meal and supplement intake to promote healing. Pt amenable to supplements.   Medications reviewed and include remeron and  0.9% sodium chloride infusion @ 100 ml/hr.   Labs reviewed: CBGS: 135 (inpatient orders for glycemic control are none).   NUTRITION - FOCUSED PHYSICAL EXAM:    Most Recent Value  Orbital Region  Mild depletion  Upper Arm Region  Mild depletion  Thoracic and Lumbar Region  No depletion  Buccal Region  No depletion  Temple Region  Mild depletion  Clavicle Bone Region  No depletion  Clavicle and Acromion Bone Region  No depletion  Scapular Bone Region  No depletion  Dorsal Hand  Mild depletion  Patellar Region  No depletion  Anterior Thigh Region  No depletion  Posterior Calf Region  No depletion  Edema (RD Assessment)  Mild  Hair  Reviewed  Eyes  Reviewed  Mouth  Reviewed  Skin  Reviewed  Nails  Reviewed       Diet Order:   Diet Order            Diet Heart Room service appropriate? Yes; Fluid consistency: Thin  Diet effective now              EDUCATION NEEDS:   Education needs have been addressed  Skin:  Skin Assessment: Reviewed RN Assessment  Last BM:  Unknown  Height:   Ht Readings from Last 1 Encounters:  11/05/19 5\' 3"  (1.6 m)    Weight:   Wt Readings from Last 1 Encounters:  11/05/19 86.2 kg    Ideal Body Weight:  52.3 kg  BMI:  Body mass index is 33.66 kg/m.  Estimated Nutritional Needs:   Kcal:  1800-2000  Protein:  90-105 grams  Fluid:  > 1.8 L    Loistine Chance, RD, LDN, [place] Registered Dietitian II Certified Diabetes Care and Education Specialist Please refer to [facility] for RD and/or RD on-call/weekend/after hours pager

## 2019-11-07 NOTE — Plan of Care (Signed)
  Problem: Clinical Measurements: Goal: Will remain free from infection Outcome: Progressing Note: Pt has shown no signs of infection during my care.    Problem: Activity: Goal: Risk for activity intolerance will decrease Outcome: Progressing Note: Pt has been able to sit in chair for an hour today to eat her lunch during my care.    Problem: Pain Managment: Goal: General experience of comfort will improve Outcome: Progressing Note: Pt has not complained of pain during my care.    Problem: Safety: Goal: Ability to remain free from injury will improve Outcome: Progressing Note: Pt has remained free from falls during my care.

## 2019-11-07 NOTE — Progress Notes (Signed)
Referring Physician(s): Dr. Rush Landmark  Supervising Physician: Aletta Edouard  Patient Status:  [facility] - In-pt  Chief Complaint: Follow up obstructive jaundice s/p percutaneous cholangiogram with placement of external biliary drain 11/06/19 by Dr. Anselm Pancoast  Subjective:  Patient seen in her room, husband at bedside - she is ordering lunch currently and is excited to eat. She is somewhat disoriented to time but is aware of the procedure yesterday and that she is at [facility]. She also states that she remembers she would need to have another procedure soon, however she was under the impression Dr. Rush Landmark would be performing this procedure. She also asks me several times to make sure that it is noted in her chart that she recently had pneumonia and was in the hospital for this not too long ago. She denies any complaints regarding the drain.   Her husband reports that she has been doing well today, had a small bowel movement earlier without any blood or other concerns. He is concerned about dressing changes because after having a different bowel procedure in the past the dressing was never changed and she experienced significant skin irritation and breakdown which lead to an infection, he is requesting that the dressing be changed as frequently as possible to prevent this.   We discussed plan for PTC with possible internal biliary drain placement and brush biopsy tomorrow as well as the procedure risks, benefits and alternatives. They both state understanding and are agreeable to this plan.   Allergies: Codeine, Contrast media [iodinated diagnostic agents], Iohexol, Aspirin, and Fentanyl  Medications: Prior to Admission medications   Medication Sig Start Date End Date Taking? Authorizing Provider  albuterol (VENTOLIN HFA) 108 (90 Base) MCG/ACT inhaler Inhale 2 puffs into the lungs every 6 (six) hours as needed for cough or shortness of breath. 10/07/19  Yes [provider]    benzonatate (TESSALON) 100 MG capsule Take 100 mg by mouth 3 (three) times daily as needed for cough.  06/18/19  Yes [provider]  cefdinir (OMNICEF) 300 MG capsule Take 300 mg by mouth 2 (two) times daily. DS 10 10/30/19  Yes [provider]  dorzolamide (TRUSOPT) 2 % ophthalmic solution Place 1 drop into both eyes 2 (two) times daily.  11/15/15  Yes [provider]  fluticasone (FLONASE) 50 MCG/ACT nasal spray Place 2 sprays into both nostrils daily. Patient taking differently: Place 2 sprays into both nostrils at bedtime as needed for allergies.  09/01/15  Yes Skeet Latch, MD  furosemide (LASIX) 20 MG tablet Take 20 mg by mouth as needed for fluid. Mon, Wed, Fri 07/27/19  Yes [provider]  gabapentin (NEURONTIN) 100 MG capsule Take 100 mg by mouth 3 (three) times daily. 10/16/19  Yes [provider]  levothyroxine (SYNTHROID) 50 MCG tablet Take 50 mcg by mouth daily. 12/18/18  Yes [provider]  mirtazapine (REMERON) 15 MG tablet Take 15 mg by mouth at bedtime. 07/27/19  Yes [provider]  Multiple Vitamins-Minerals (PRESERVISION AREDS 2+MULTI VIT) CAPS Take 1 capsule by mouth 2 (two) times daily.   Yes [provider]  ondansetron (ZOFRAN) 4 MG tablet Take 1 tablet by mouth every 8 (eight) hours as needed for nausea or vomiting.  05/31/19  Yes [provider]  pantoprazole (PROTONIX) 40 MG tablet Take 40 mg by mouth daily.   Yes [provider]  potassium chloride (KLOR-CON) 10 MEQ tablet Take 10 mEq by mouth. Mon, Wed, Friday, if taking lasix 07/27/19  Yes [provider]  ROCKLATAN 0.02-0.005 % SOLN Place 1 drop into both eyes at bedtime. 01/08/19  Yes [provider]  simethicone (MYLICON) 80 MG chewable tablet Chew 1 tablet (80 mg total) by mouth every 4 (four) hours as needed for flatulence (also available OTC). 05/19/19  Yes Rai, Ripudeep K, MD  losartan (COZAAR) 100 MG tablet  TAKE 1 TABLET EACH DAY. Patient taking differently: Take 100 mg by mouth daily.  07/24/16   Skeet Latch, MD  zoledronic acid (RECLAST) 5 MG/100ML SOLN injection Inject 5 mg into the vein yearly. [facility] June 2015)    [provider]     Vital Signs: BP 96/62 (BP Location: Left Wrist)   Pulse 84   Temp 97.9 F (36.6 C) (Axillary)   Resp 16   Ht '5\' 3"'$  (1.6 m)   Wt 190 lb 0.6 oz (86.2 kg)   SpO2 98%   BMI 33.66 kg/m   Physical Exam Vitals and nursing note reviewed.  Constitutional:      General: She is not in acute distress. HENT:     Head: Normocephalic.  Cardiovascular:     Rate and Rhythm: Normal rate.  Pulmonary:     Effort: Pulmonary effort is normal.  Abdominal:     General: There is no distension.     Palpations: Abdomen is soft.     Tenderness: There is no abdominal tenderness.     Comments: (+) biliary drain to gravity with ~100 cc clear, bilious output. No obvious blood noted. Insertion site clean, dry, dressed appropriately. Suture and stat lock are in tact. No bleeding or drainage noted. Flushes easily.  Skin:    General: Skin is warm and dry.  Neurological:     Mental Status: She is alert.     Imaging: CT ABDOMEN PELVIS WO CONTRAST  Result Date: 11/03/2019 CLINICAL DATA:  Abnormal liver labs. EXAM: CT ABDOMEN AND PELVIS WITHOUT CONTRAST TECHNIQUE: Multidetector CT imaging of the abdomen and pelvis was performed following the standard protocol without IV contrast. COMPARISON:  May 01, 2019 FINDINGS: Lower chest: Mild scarring and/or atelectasis is seen within the anterior and posterior aspect of the left lung base. There are small bilateral pleural effusions, left greater than right. Hepatobiliary: No focal liver abnormality is seen. Status post cholecystectomy. There is marked severity intra hepatic biliary dilatation with dilatation of the common bile duct. Pancreas: Unremarkable. No pancreatic ductal dilatation or surrounding inflammatory changes.  Spleen: Normal in size without focal abnormality. Adrenals/Urinary Tract: Adrenal glands are unremarkable. Kidneys are normal in size. Multiple stable bilateral renal cysts are seen. The largest measures approximately 3.7 cm in diameter and is located within the posterior aspect of the mid to lower right kidney. 1.9 cm and 0.5 cm nonobstructing renal stones are seen within the right kidney. 0.3 cm and 0.6 cm nonobstructing renal stones are seen within the left kidney. Bladder is unremarkable. Stomach/Bowel: Stomach is within normal limits. The appendix is not identified. Surgically anastomosed bowel is seen within the anterior aspect of the mid to upper abdomen. No evidence of bowel dilatation. Noninflamed diverticula are seen throughout the sigmoid colon. Vascular/Lymphatic: Mild aortic atherosclerosis. No enlarged abdominal or pelvic lymph nodes. Reproductive: A stable 2.0 cm x 1.5 cm coarse calcification is seen within the posterior aspect of the uterus. The bilateral adnexa are unremarkable. Other: No abdominal wall hernia or abnormality. No abdominopelvic ascites. Musculoskeletal: Bilateral metallic density pedicle screws are seen at the levels of L2, L3 and L4 vertebral bodies.  Moderate to marked severity degenerative changes are noted throughout the remainder of the lumbar spine. IMPRESSION: 1. Marked severity intra hepatic biliary dilatation with dilatation of the common bile duct. This represents a new finding when compared to the prior study dated May 01, 2019 and may be related to the patient's post cholecystectomy state, or possibly a distal common bile duct stricture. 2. Bilateral nonobstructing renal stones. 3. Sigmoid diverticulosis without evidence of diverticulitis. 4. Calcified uterine fibroid. 5. Small bilateral pleural effusions, left greater than right. Electronically Signed   By: Virgina Norfolk M.D.   On: 11/03/2019 23:06   MR 3D Recon At Scanner  Result Date: 11/04/2019 CLINICAL DATA:   Painless jaundice. Weight loss. Biliary ductal dilatation is on recent CT. Prior cholecystectomy. EXAM: MRI ABDOMEN WITHOUT AND WITH CONTRAST (INCLUDING MRCP) TECHNIQUE: Multiplanar multisequence MR imaging of the abdomen was performed both before and after the administration of intravenous contrast. Heavily T2-weighted images of the biliary and pancreatic ducts were obtained, and three-dimensional MRCP images were rendered by post processing. CONTRAST:  37m GADAVIST GADOBUTROL 1 MMOL/ML IV SOLN COMPARISON:  CT on 11/03/2019 and 05/01/2019 FINDINGS: Lower chest: Small bilateral pleural effusions again seen. Hepatobiliary: No hepatic masses identified. Prior cholecystectomy. Severe diffuse biliary ductal dilatation is again seen with common bile duct measuring 31 mm diameter. Stricture of the distal common bile duct is seen in the pancreatic head. No evidence of choledocholithiasis. Pancreas: Mild diffuse pancreatic ductal dilatation is seen. An ill-defined enhancing lesion with central cystic foci is seen in the pancreatic head, which measures 2.1 x 1.8 cm on image 97/series 1201. This also causes obstruction of the distal common bile duct. This is highly suspicious for pancreatic carcinoma. Spleen:  Within normal limits in size and appearance. Adrenals/Urinary Tract: Bilateral renal cysts are noted. No masses identified. No evidence of hydronephrosis. Stomach/Bowel: Visualized portion unremarkable. Vascular/Lymphatic: No pathologically enlarged lymph nodes identified. No abdominal aortic aneurysm. Other:  None. Musculoskeletal:  No suspicious bone lesions identified. IMPRESSION: 1. Diffuse biliary and pancreatic ductal dilatation due to 2 cm lesion in the pancreatic head. This is highly suspicious for pancreatic carcinoma. Consider endoscopic ultrasound for further evaluation. 2. No evidence of abdominal metastatic disease. 3. Small bilateral pleural effusions. Electronically Signed   By: JMarlaine HindM.D.   On:  11/04/2019 18:41   DG C-Arm 1-60 Min-No Report  Result Date: 11/06/2019 Fluoroscopy was utilized by the requesting physician.  No radiographic interpretation.   MR ABDOMEN MRCP W WO CONTAST  Result Date: 11/04/2019 CLINICAL DATA:  Painless jaundice. Weight loss. Biliary ductal dilatation is on recent CT. Prior cholecystectomy. EXAM: MRI ABDOMEN WITHOUT AND WITH CONTRAST (INCLUDING MRCP) TECHNIQUE: Multiplanar multisequence MR imaging of the abdomen was performed both before and after the administration of intravenous contrast. Heavily T2-weighted images of the biliary and pancreatic ducts were obtained, and three-dimensional MRCP images were rendered by post processing. CONTRAST:  839mGADAVIST GADOBUTROL 1 MMOL/ML IV SOLN COMPARISON:  CT on 11/03/2019 and 05/01/2019 FINDINGS: Lower chest: Small bilateral pleural effusions again seen. Hepatobiliary: No hepatic masses identified. Prior cholecystectomy. Severe diffuse biliary ductal dilatation is again seen with common bile duct measuring 31 mm diameter. Stricture of the distal common bile duct is seen in the pancreatic head. No evidence of choledocholithiasis. Pancreas: Mild diffuse pancreatic ductal dilatation is seen. An ill-defined enhancing lesion with central cystic foci is seen in the pancreatic head, which measures 2.1 x 1.8 cm on image 97/series 1201. This also causes obstruction of the distal common  bile duct. This is highly suspicious for pancreatic carcinoma. Spleen:  Within normal limits in size and appearance. Adrenals/Urinary Tract: Bilateral renal cysts are noted. No masses identified. No evidence of hydronephrosis. Stomach/Bowel: Visualized portion unremarkable. Vascular/Lymphatic: No pathologically enlarged lymph nodes identified. No abdominal aortic aneurysm. Other:  None. Musculoskeletal:  No suspicious bone lesions identified. IMPRESSION: 1. Diffuse biliary and pancreatic ductal dilatation due to 2 cm lesion in the pancreatic head. This is  highly suspicious for pancreatic carcinoma. Consider endoscopic ultrasound for further evaluation. 2. No evidence of abdominal metastatic disease. 3. Small bilateral pleural effusions. Electronically Signed   By: Marlaine Hind M.D.   On: 11/04/2019 18:41   IR BILIARY DRAIN PLACEMENT WITH CHOLANGIOGRAM  Result Date: 11/06/2019 INDICATION: 85 year old with biliary obstruction and pancreatic lesion. Biliary system could not be decompressed with ERCP. History of gastrojejunostomy. Patient presents for percutaneous transhepatic cholangiogram and biliary drain placement. EXAM: PERCUTANEOUS TRANSHEPATIC CHOLANGIOGRAM WITH ULTRASOUND AND FLUOROSCOPIC GUIDANCE PLACEMENT OF EXTERNAL BILIARY DRAIN MEDICATIONS: 125 mg Solu-Medrol. Steroid was given due to contrast allergy. Patient was pre-medicated earlier in the day for the ERCP study. ANESTHESIA/SEDATION: Moderate (conscious) sedation was employed during this procedure. A total of Versed 3.0 mg and Dilaudid 2.0 mg was administered intravenously. Moderate Sedation Time: 64 minutes. The patient's level of consciousness and vital signs were monitored continuously by radiology nursing throughout the procedure under my direct supervision. FLUOROSCOPY TIME:  Fluoroscopy Time: 10 minutes, 30 seconds, 107 mGy CONTRAST:  25 mL Omnipaque [id number] system COMPLICATIONS: None immediate. PROCEDURE: Informed written consent was obtained from the patient after a thorough discussion of the procedural risks, benefits and alternatives. All questions were addressed. Maximal Sterile Barrier Technique was utilized including caps, mask, sterile gowns, sterile gloves, sterile drape, hand hygiene and skin antiseptic. A timeout was performed prior to the initiation of the procedure. The abdomen was prepped and draped in sterile fashion. Ultrasound was used to evaluate the liver. Left lateral hepatic lobe was targeted for percutaneous access. The anterior abdomen was prepped with 1% lidocaine.  Using ultrasound guidance, 21 gauge needle was directed into a dilated peripheral bile duct. Contrast injection confirmed placement in the biliary system. 0.018 wire was advanced and a transitional dilator set was placed. Five French catheter and Glidewire were advanced to the distal common bile duct. Additional cholangiograms were performed. Eventually, the Glidewire was able to pass the obstruction in the distal common bile duct. Catheter was advanced into the duodenum with difficulty. Tortuosity of the distal common bile duct made access into the duodenum difficult. A stiff Glidewire was placed. The tract was dilated and a 10.2 [place] biliary drain was advanced over the wire. The drain easily advanced into the liver but would not advance across the distal biliary obstruction. At this point, the patient was very uncomfortable and moving on the table. I was unable to exchange the 10 [place] biliary tube for a smaller tube or try to dilate the obstruction due to patient's discomfort. Therefore, the catheter was advanced into the biliary system and the tip was placed in the distal common bile duct. Contrast injection confirmed that the tube was well positioned within the biliary system. Bile was aspirated. Sample of the bile was sent for culture and cytology. Catheter was sutured to skin and attached to a gravity bag. Dressing was placed over the tube. FINDINGS: Severe intrahepatic and extrahepatic biliary dilatation. A dilated peripheral bile duct was accessed in the lateral left hepatic lobe. There was complete obstruction in the distal common bile  duct. Catheter and wire were advanced across the obstruction but the biliary drain could not be advanced due to patient's discomfort. IMPRESSION: 1. Severe intrahepatic and extrahepatic biliary dilatation due to obstruction in the distal common bile duct. 2. Successful placement of an external biliary drain. Catheter tip in the distal common bile duct. 3. Will plan to  convert the external biliary drain to an internal-external biliary drain in the next few days. If the patient still needs a tissue diagnosis, we may be able to perform brush biopsies at the same time. Electronically Signed   By: Markus Daft M.D.   On: 11/06/2019 17:52    Labs:  CBC: Recent Labs    11/03/19 1810 11/04/19 1055 11/05/19 0249 11/07/19 0231  WBC 6.0 5.7 4.8 8.5  HGB 11.0* 11.0* 9.8* 10.3*  HCT 35.1* 35.0* 30.4* 31.5*  PLT 358 345 208 335    COAGS: Recent Labs    11/03/19 1432 11/04/19 1055  INR 1.4* 1.2    BMP: Recent Labs    11/03/19 1810 11/04/19 1055 11/05/19 0249 11/07/19 0231  NA 135 140 138 137  K 4.2 3.8 3.8 4.7  CL 105 109 109 109  CO2 21* 20* 20* 21*  GLUCOSE 92 110* 83 147*  BUN '11 11 10 15  '$ CALCIUM 9.8 10.1 9.5 9.6  CREATININE 1.29* 1.26* 1.08* 1.18*  GFRNONAA 39* 40* 48* 43*  GFRAA 45* 46* 55* 50*    LIVER FUNCTION TESTS: Recent Labs    11/03/19 1810 11/04/19 1055 11/05/19 0249 11/07/19 0231  BILITOT 4.9* 5.4* 5.4* 3.0*  AST 319* 274* 220* 149*  ALT 124* 112* 90* 86*  ALKPHOS 1,110* 1,178* 991* 1,033*  PROT 5.0* 5.0* 4.2* 4.7*  ALBUMIN 2.2* 2.0* 1.8* 2.0*    Assessment and Plan:  85 y/o F s/p PTC and external drain placement 11/06/19 by Dr. Anselm Pancoast seen today for drain follow up as well as planning for internal biliary drain placement/brush biopsy.   Per I/O 330 cc output since placement, on my exam there is ~100 cc of clear, bilious output without overt blood. Insertion site is unremarkable, the dressing is clean and dry. Drain flushes easily without leakage from insertion site noted. Continued TID flushes with 5 cc NS, record output Qshift, dressing changes QD or PRN if soiled - I discussed with patient's husband today that changing the dressing too frequently carries the possibility of introducing infection which he understands and is agreeable to QD dressing changes/PRN if soiled.   Will plan for PTC with internal biliary drain  placement and brush biopsy tomorrow morning in IR. Patient noted to have a contrast allergy with reaction listed as hives/dyspnea - will plan for 13 hour pre-medication regimen for planned procedure time of 9 am. I have held her Lovenox dose for tonight, this is typically able to be resumed post procedure. Patient to be NPO at midnight, AM labs pending.  Risks and benefits of percutaneous cholangiogram with internal biliary drain placement and possible brush biopsy discussed with the patient and her husband including, but not limited to bleeding, infection which may lead to sepsis or even death and damage to adjacent structures.  This interventional procedure involves the use of X-rays and because of the nature of the planned procedure, it is possible that we will have prolonged use of X-ray fluoroscopy.  Potential radiation risks to you include (but are not limited to) the following: - A slightly elevated risk for cancer  several years later in life. This risk  is typically less than 0.5% percent. This risk is low in comparison to the normal incidence of human cancer, which is 33% for women and 50% for men according to the [employer]. - Radiation induced injury can include skin redness, resembling a rash, tissue breakdown / ulcers and hair loss (which can be temporary or permanent).   The likelihood of either of these occurring depends on the difficulty of the procedure and whether you are sensitive to radiation due to previous procedures, disease, or genetic conditions.   IF your procedure requires a prolonged use of radiation, you will be notified and given written instructions for further action.  It is your responsibility to monitor the irradiated area for the 2 weeks following the procedure and to notify your physician if you are concerned that you have suffered a radiation induced injury.    All of the patient's questions were answered, patient is agreeable to proceed.  Consent  signed by patient's husband today and is in chart.  Electronically Signed: Joaquim Nam, PA-C 11/07/2019, 12:14 PM   I spent a total of 25 Minutes at the the patient's bedside AND on the patient's hospital floor or unit, greater than 50% of which was counseling/coordinating care for external biliary drain follow up/internal biliary drain and brush biopsy.

## 2019-11-07 NOTE — Progress Notes (Signed)
PROGRESS NOTE    PAM FINNIGAN  [id number] DOB: [birth year] DOA: 11/04/2019 PCP: Burnard Bunting, MD    Brief Narrative:  85 year old female with history of hypertension, hyperlipidemia, diet-controlled diabetes, hyperparathyroidism status post parathyroidectomy, obesity and sleep apnea.  Recently treated for pneumonia.  History of gastrojejunostomy for a stricture and J-tube placement who was following outpatient and found to have painless jaundice on routine evaluation. She was been evaluated for outpatient MRCP, however repeat lab work showed worsening liver enzymes and was brought to the hospital. In the emergency room, hemodynamically stable.  Bilirubin 5.4.  AST/ALT, 274/112.  CT scan abdomen showed severe intrahepatic and common bile duct dilatation. MRI, MRCP showed diffuse biliary and pancreatic ductal dilation and 2 cm lesion in the pancreatic head.  Assessment & Plan:   Principal Problem:   Painless jaundice Active Problems:   Hypertension   CKD (chronic kidney disease), stage III   Hypothyroid   Idiopathic peripheral neuropathy   Elevated liver enzymes   Pleural effusion, bilateral   Pancreatic mass   Biliary obstruction  Painless jaundice, pancreatic head mass:  Status post ERCP, EUS and pancreatic mass biopsy by GI 2/4 .  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 Status post percutaneous biliary drain by interventional radiology 2/4 , plan to change to external/internal drain next few days. Continue monitoring.  Maintenance IV fluids. Further management as per diagnostic outcome. Continue to monitor liver function test and electrolytes, recheck tomorrow morning. Advance activities and ambulate.  Hypertension: Blood pressures are stable.  Chronic kidney disease stage IIIb: Stable.  On maintenance fluid.    Hypothyroidism: On Synthroid.  Continue.   DVT prophylaxis: Lovenox Code Status: Full code Family Communication:  None, husband at the bedside 2/4 Disposition Plan: patient is from home. Anticipated DC to home, Barriers to discharge, on active treatment.   Further procedures planned by interventional radiology.   Consultants:   Gastroenterology  Interventional radiology  Procedures:   None  Antimicrobials:  Anti-infectives (From admission, onward)   Start     Dose/Rate Route Frequency Ordered Stop   11/06/19 1700  Ampicillin-Sulbactam (UNASYN) 3 g in sodium chloride 0.9 % 100 mL IVPB     3 g 200 mL/hr over 30 Minutes Intravenous  Once 11/06/19 1542 11/06/19 1707   11/06/19 0830  Ampicillin-Sulbactam (UNASYN) 3 g in sodium chloride 0.9 % 100 mL IVPB     3 g 200 mL/hr over 30 Minutes Intravenous  Once 11/06/19 0754 11/06/19 0840   11/04/19 2200  cefdinir (OMNICEF) capsule 300 mg    Note to Pharmacy: DS 10     300 mg Oral 2 times daily 11/04/19 1651 11/04/19 2138       Subjective: Patient seen and examined in the morning rounds.  No overnight events.  Denies any nausea vomiting.  Denies any abdominal pain.  She was able to have adequate urination overnight.  Objective: Vitals:   11/06/19 2058 11/07/19 0300 11/07/19 0551 11/07/19 1100  BP: (!) 105/22 116/65 (!) 109/58 96/62  Pulse: 83 79 76 84  Resp: 18 15 18 16   Temp: 97.9 F (36.6 C) (!) 97.4 F (36.3 C) 97.6 F (36.4 C) 97.9 F (36.6 C)  TempSrc: Oral Oral Oral Axillary  SpO2: 92% 97% 95% 98%  Weight:      Height:        Intake/Output Summary (Last 24 hours) at 11/07/2019 1316 Last data filed at 11/07/2019 0900 Gross per 24 hour  Intake  1100 ml  Output 730 ml  Net 370 ml   Filed Weights   11/05/19 0912  Weight: 86.2 kg    Examination:  General exam: Appears calm and comfortable , icteric.  Comfortable on room air. Pulmonary: Bilateral clear CV: Regular rate rhythm.  No added sounds.  No murmurs Extremities: Nontender.  No edema. Gastrointestinal system: Abdomen is nondistended, soft and nontender.  Bile drain with  freeflowing bile.  Data Reviewed: I have personally reviewed following labs and imaging studies  CBC: Recent Labs  Lab 11/03/19 1432 11/03/19 1810 11/04/19 1055 11/05/19 0249 11/07/19 0231  WBC 5.5 6.0 5.7 4.8 8.5  NEUTROABS 3.7  --  3.2  --  7.4  HGB 11.1* 11.0* 11.0* 9.8* 10.3*  HCT 33.3* 35.1* 35.0* 30.4* 31.5*  MCV 91.4 95.4 94.3 92.7 91.0  PLT 329.0 358 345 208 [id number]   Basic Metabolic Panel: Recent Labs  Lab 11/03/19 1432 11/03/19 1810 11/04/19 1055 11/05/19 0249 11/07/19 0231  NA 137 135 140 138 137  K 3.9 4.2 3.8 3.8 4.7  CL 107 105 109 109 109  CO2 22 21* 20* 20* 21*  GLUCOSE 122* 92 110* 83 147*  BUN 12 11 11 10 15   CREATININE 1.10 1.29* 1.26* 1.08* 1.18*  CALCIUM 9.7 9.8 10.1 9.5 9.6  MG  --   --   --   --  1.6*  PHOS  --   --   --   --  3.5   GFR: Estimated Creatinine Clearance: 38.2 mL/min (A) (by C-G formula based on SCr of 1.18 mg/dL (H)). Liver Function Tests: Recent Labs  Lab 11/03/19 1432 11/03/19 1810 11/04/19 1055 11/05/19 0249 11/07/19 0231  AST 272* 319* 274* 220* 149*  ALT 111* 124* 112* 90* 86*  ALKPHOS 1,220* 1,110* 1,178* 991* 1,033*  BILITOT 4.4* 4.9* 5.4* 5.4* 3.0*  PROT 4.9* 5.0* 5.0* 4.2* 4.7*  ALBUMIN 2.6* 2.2* 2.0* 1.8* 2.0*   Recent Labs  Lab 11/03/19 1432 11/03/19 1810  LIPASE 504.0* 213*   Recent Labs  Lab 11/04/19 1055  AMMONIA 50*   Coagulation Profile: Recent Labs  Lab 11/03/19 1432 11/04/19 1055  INR 1.4* 1.2   Cardiac Enzymes: No results for input(s): CKTOTAL, CKMB, CKMBINDEX, TROPONINI in the last 168 hours. BNP (last 3 results) No results for input(s): PROBNP in the last 8760 hours. HbA1C: No results for input(s): HGBA1C in the last 72 hours. CBG: Recent Labs  Lab 11/05/19 0017 11/05/19 0332 11/05/19 0724 11/05/19 1142 11/06/19 0751  GLUCAP 82 71 70 76 135*   Lipid Profile: No results for input(s): CHOL, HDL, LDLCALC, TRIG, CHOLHDL, LDLDIRECT in the last 72 hours. Thyroid Function  Tests: No results for input(s): TSH, T4TOTAL, FREET4, T3FREE, THYROIDAB in the last 72 hours. Anemia Panel: No results for input(s): VITAMINB12, FOLATE, FERRITIN, TIBC, IRON, RETICCTPCT in the last 72 hours. Sepsis Labs: No results for input(s): PROCALCITON, LATICACIDVEN in the last 168 hours.  Recent Results (from the past 240 hour(s))  Respiratory Panel by RT PCR (Flu A&B, Covid) - Nasopharyngeal Swab     Status: None   Collection Time: 11/04/19 10:50 AM   Specimen: Nasopharyngeal Swab  Result Value Ref Range Status   SARS Coronavirus 2 by RT PCR NEGATIVE NEGATIVE Final    Comment: (NOTE) SARS-CoV-2 target nucleic acids are NOT DETECTED. The SARS-CoV-2 RNA is generally detectable in upper respiratoy specimens during the acute phase of infection. The lowest concentration of SARS-CoV-2 viral copies this assay can detect is 131 copies/mL.  A negative result does not preclude SARS-Cov-2 infection and should not be used as the sole basis for treatment or other patient management decisions. A negative result may occur with  improper specimen collection/handling, submission of specimen other than nasopharyngeal swab, presence of viral mutation(s) within the areas targeted by this assay, and inadequate number of viral copies (<131 copies/mL). A negative result must be combined with clinical observations, patient history, and epidemiological information. The expected result is Negative. Fact Sheet for Patients:  PinkCheek.be Fact Sheet for Healthcare Providers:  GravelBags.it This test is not yet ap proved or cleared by the [place] FDA and  has been authorized for detection and/or diagnosis of SARS-CoV-2 by FDA under an Emergency Use Authorization (EUA). This EUA will remain  in effect (meaning this test can be used) for the duration of the COVID-19 declaration under Section 564(b)(1) of the Act, 21 U.S.C. section  360bbb-3(b)(1), unless the authorization is terminated or revoked sooner.    Influenza A by PCR NEGATIVE NEGATIVE Final   Influenza B by PCR NEGATIVE NEGATIVE Final    Comment: (NOTE) The Xpert Xpress SARS-CoV-2/FLU/RSV assay is intended as an aid in  the diagnosis of influenza from Nasopharyngeal swab specimens and  should not be used as a sole basis for treatment. Nasal washings and  aspirates are unacceptable for Xpert Xpress SARS-CoV-2/FLU/RSV  testing. Fact Sheet for Patients: PinkCheek.be Fact Sheet for Healthcare Providers: GravelBags.it This test is not yet approved or cleared by the [place] FDA and  has been authorized for detection and/or diagnosis of SARS-CoV-2 by  FDA under an Emergency Use Authorization (EUA). This EUA will remain  in effect (meaning this test can be used) for the duration of the  Covid-19 declaration under Section 564(b)(1) of the Act, 21  U.S.C. section 360bbb-3(b)(1), unless the authorization is  terminated or revoked. Performed at [facility], [place] [address], [place], [place] [address]   Aerobic/Anaerobic Culture (surgical/deep wound)     Status: None (Preliminary result)   Collection Time: 11/06/19  3:25 PM   Specimen: BILE  Result Value Ref Range Status   Specimen Description BILE  Final   Special Requests NONE  Final   Gram Stain   Final    MODERATE WBC PRESENT, PREDOMINANTLY MONONUCLEAR NO ORGANISMS SEEN    Culture   Final    NO GROWTH < 24 HOURS Performed at [facility], [place] [address], [place], [place] [address]    Report Status PENDING  Incomplete         Radiology Studies: DG C-Arm 1-60 Min-No Report  Result Date: 11/06/2019 Fluoroscopy was utilized by the requesting physician.  No radiographic interpretation.   IR BILIARY DRAIN PLACEMENT WITH CHOLANGIOGRAM  Result Date: 11/06/2019 INDICATION: 85 year old with biliary obstruction and pancreatic  lesion. Biliary system could not be decompressed with ERCP. History of gastrojejunostomy. Patient presents for percutaneous transhepatic cholangiogram and biliary drain placement. EXAM: PERCUTANEOUS TRANSHEPATIC CHOLANGIOGRAM WITH ULTRASOUND AND FLUOROSCOPIC GUIDANCE PLACEMENT OF EXTERNAL BILIARY DRAIN MEDICATIONS: 125 mg Solu-Medrol. Steroid was given due to contrast allergy. Patient was pre-medicated earlier in the day for the ERCP study. ANESTHESIA/SEDATION: Moderate (conscious) sedation was employed during this procedure. A total of Versed 3.0 mg and Dilaudid 2.0 mg was administered intravenously. Moderate Sedation Time: 64 minutes. The patient's level of consciousness and vital signs were monitored continuously by radiology nursing throughout the procedure under my direct supervision. FLUOROSCOPY TIME:  Fluoroscopy Time: 10 minutes, 30 seconds, 107 mGy CONTRAST:  25  mL Omnipaque [id number] system COMPLICATIONS: None immediate. PROCEDURE: Informed written consent was obtained from the patient after a thorough discussion of the procedural risks, benefits and alternatives. All questions were addressed. Maximal Sterile Barrier Technique was utilized including caps, mask, sterile gowns, sterile gloves, sterile drape, hand hygiene and skin antiseptic. A timeout was performed prior to the initiation of the procedure. The abdomen was prepped and draped in sterile fashion. Ultrasound was used to evaluate the liver. Left lateral hepatic lobe was targeted for percutaneous access. The anterior abdomen was prepped with 1% lidocaine. Using ultrasound guidance, 21 gauge needle was directed into a dilated peripheral bile duct. Contrast injection confirmed placement in the biliary system. 0.018 wire was advanced and a transitional dilator set was placed. Five French catheter and Glidewire were advanced to the distal common bile duct. Additional cholangiograms were performed. Eventually, the Glidewire was able to pass the  obstruction in the distal common bile duct. Catheter was advanced into the duodenum with difficulty. Tortuosity of the distal common bile duct made access into the duodenum difficult. A stiff Glidewire was placed. The tract was dilated and a 10.2 [place] biliary drain was advanced over the wire. The drain easily advanced into the liver but would not advance across the distal biliary obstruction. At this point, the patient was very uncomfortable and moving on the table. I was unable to exchange the 10 [place] biliary tube for a smaller tube or try to dilate the obstruction due to patient's discomfort. Therefore, the catheter was advanced into the biliary system and the tip was placed in the distal common bile duct. Contrast injection confirmed that the tube was well positioned within the biliary system. Bile was aspirated. Sample of the bile was sent for culture and cytology. Catheter was sutured to skin and attached to a gravity bag. Dressing was placed over the tube. FINDINGS: Severe intrahepatic and extrahepatic biliary dilatation. A dilated peripheral bile duct was accessed in the lateral left hepatic lobe. There was complete obstruction in the distal common bile duct. Catheter and wire were advanced across the obstruction but the biliary drain could not be advanced due to patient's discomfort. IMPRESSION: 1. Severe intrahepatic and extrahepatic biliary dilatation due to obstruction in the distal common bile duct. 2. Successful placement of an external biliary drain. Catheter tip in the distal common bile duct. 3. Will plan to convert the external biliary drain to an internal-external biliary drain in the next few days. If the patient still needs a tissue diagnosis, we may be able to perform brush biopsies at the same time. Electronically Signed   By: Markus Daft M.D.   On: 11/06/2019 17:52        Scheduled Meds: . dorzolamide  1 drop Both Eyes BID  . enoxaparin (LOVENOX) injection  40 mg Subcutaneous Q24H   . feeding supplement (ENSURE ENLIVE)  237 mL Oral BID BM  . gabapentin  100 mg Oral TID  . indomethacin  100 mg Rectal Once  . levothyroxine  50 mcg Oral Daily  . mirtazapine  15 mg Oral QHS  . multivitamin with minerals  1 tablet Oral Daily  . Netarsudil-Latanoprost  1 drop Both Eyes QHS  . pantoprazole  40 mg Oral Daily  . sodium chloride flush  3 mL Intravenous Q12H  . sodium chloride flush  5 mL Intracatheter Q12H   Continuous Infusions: . sodium chloride 100 mL/hr at 11/07/19 0924     LOS: 3 days    Time spent: 25 minutes  Barb Merino, MD [facility] Pager [phone]

## 2019-11-08 ENCOUNTER — Inpatient Hospital Stay (HOSPITAL_COMMUNITY): Payer: Medicare PPO

## 2019-11-08 HISTORY — PX: IR INT EXT BILIARY DRAIN WITH CHOLANGIOGRAM: IMG6044

## 2019-11-08 HISTORY — PX: IR BALLOON DILATION OF BILIARY DUCTS/AMPULLA: IMG6052

## 2019-11-08 LAB — COMPREHENSIVE METABOLIC PANEL
ALT: 65 U/L — ABNORMAL HIGH (ref 0–44)
AST: 60 U/L — ABNORMAL HIGH (ref 15–41)
Albumin: 2 g/dL — ABNORMAL LOW (ref 3.5–5.0)
Alkaline Phosphatase: 810 U/L — ABNORMAL HIGH (ref 38–126)
Anion gap: 6 (ref 5–15)
BUN: 19 mg/dL (ref 8–23)
CO2: 20 mmol/L — ABNORMAL LOW (ref 22–32)
Calcium: 9.8 mg/dL (ref 8.9–10.3)
Chloride: 113 mmol/L — ABNORMAL HIGH (ref 98–111)
Creatinine, Ser: 1.22 mg/dL — ABNORMAL HIGH (ref 0.44–1.00)
GFR calc Af Amer: 48 mL/min — ABNORMAL LOW (ref >60)
GFR calc non Af Amer: 41 mL/min — ABNORMAL LOW (ref >60)
Glucose, Bld: 158 mg/dL — ABNORMAL HIGH (ref 70–99)
Potassium: 4.6 mmol/L (ref 3.5–5.1)
Sodium: 139 mmol/L (ref 135–145)
Total Bilirubin: 2.4 mg/dL — ABNORMAL HIGH (ref 0.3–1.2)
Total Protein: 4.6 g/dL — ABNORMAL LOW (ref 6.5–8.1)

## 2019-11-08 MED ORDER — HYDROMORPHONE HCL 1 MG/ML IJ SOLN
INTRAMUSCULAR | Status: AC
  Filled 2019-11-08: qty 2

## 2019-11-08 MED ORDER — HYDROMORPHONE HCL 1 MG/ML IJ SOLN
INTRAMUSCULAR | Status: DC | PRN
  Administered 2019-11-08 (×5): 0.5 mg via INTRAVENOUS

## 2019-11-08 MED ORDER — PREDNISONE 50 MG PO TABS
50.0000 mg | ORAL_TABLET | Freq: Once | ORAL | Status: DC
  Filled 2019-11-08: qty 1

## 2019-11-08 MED ORDER — KETOROLAC TROMETHAMINE 30 MG/ML IJ SOLN
INTRAMUSCULAR | Status: DC | PRN
  Administered 2019-11-08: 30 mg via INTRAVENOUS

## 2019-11-08 MED ORDER — KETOROLAC TROMETHAMINE 30 MG/ML IJ SOLN
INTRAMUSCULAR | Status: AC
  Filled 2019-11-08: qty 1

## 2019-11-08 MED ORDER — IOHEXOL 300 MG/ML  SOLN
50.0000 mL | Freq: Once | INTRAMUSCULAR | Status: AC | PRN
  Administered 2019-11-08: 15 mL

## 2019-11-08 MED ORDER — MIDAZOLAM HCL 2 MG/2ML IJ SOLN
INTRAMUSCULAR | Status: AC
  Filled 2019-11-08: qty 6

## 2019-11-08 MED ORDER — MIDAZOLAM HCL 2 MG/2ML IJ SOLN
INTRAMUSCULAR | Status: DC | PRN
  Administered 2019-11-08 (×5): 1 mg via INTRAVENOUS

## 2019-11-08 MED ORDER — HYDROMORPHONE HCL 1 MG/ML IJ SOLN
INTRAMUSCULAR | Status: AC
  Filled 2019-11-08: qty 1

## 2019-11-08 MED ORDER — LIDOCAINE HCL 1 % IJ SOLN
INTRAMUSCULAR | Status: AC
  Filled 2019-11-08: qty 20

## 2019-11-08 MED ORDER — FENTANYL CITRATE (PF) 100 MCG/2ML IJ SOLN
INTRAMUSCULAR | Status: AC
  Filled 2019-11-08: qty 4

## 2019-11-08 NOTE — Progress Notes (Signed)
PROGRESS NOTE    [name]  [id number] DOB: Feb 14, 1935 DOA: 11/04/2019 PCP: Burnard Bunting, MD    Brief Narrative:  85 year old female with history of hypertension, hyperlipidemia, diet-controlled diabetes, hyperparathyroidism status post parathyroidectomy, obesity and sleep apnea.  Recently treated for pneumonia.  History of gastrojejunostomy for a stricture and J-tube placement who was following outpatient and found to have painless jaundice on routine evaluation. She was been evaluated for outpatient MRCP, however repeat lab work showed worsening liver enzymes and was brought to the hospital. In the emergency room, hemodynamically stable.  Bilirubin 5.4.  AST/ALT, 274/112.  CT scan abdomen showed severe intrahepatic and common bile duct dilatation. MRI, MRCP showed diffuse biliary and pancreatic ductal dilation and 2 cm lesion in the pancreatic head. ERCP and biopsy consistent with adenocarcinoma of the pancreas.  Assessment & Plan:   Principal Problem:   Painless jaundice Active Problems:   Hypertension   CKD (chronic kidney disease), stage III   Hypothyroid   Idiopathic peripheral neuropathy   Elevated liver enzymes   Pleural effusion, bilateral   Pancreatic mass   Biliary obstruction  Painless jaundice, pancreatic head mass: Pancreatic adenocarcinoma. Status post ERCP, EUS and pancreatic mass biopsy by GI 2/4 .  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  Pathology consistent with adenocarcinoma. Status post percutaneous biliary drain by interventional radiology 2/4 , changed to external/internal drain next few days. Continue monitoring.  Maintenance IV fluids. Continue to monitor liver function test and electrolytes, recheck tomorrow morning. LFTs trending down with evidence of adequate trend. Advance activities and ambulate.  Hypertension: Blood pressures are stable.  Chronic kidney disease stage IIIb: Stable.  On maintenance  fluid.    Hypothyroidism: On Synthroid.  Continue.   DVT prophylaxis: Lovenox Code Status: Full code Family Communication: None. Disposition Plan: patient is from home. Anticipated DC to home, Barriers to discharge, on active treatment.   Further procedures planned by interventional radiology today.   Consultants:   Gastroenterology  Interventional radiology  Procedures:   None  Antimicrobials:  Anti-infectives (From admission, onward)   Start     Dose/Rate Route Frequency Ordered Stop   11/06/19 1700  Ampicillin-Sulbactam (UNASYN) 3 g in sodium chloride 0.9 % 100 mL IVPB     3 g 200 mL/hr over 30 Minutes Intravenous  Once 11/06/19 1542 11/06/19 1707   11/06/19 0830  Ampicillin-Sulbactam (UNASYN) 3 g in sodium chloride 0.9 % 100 mL IVPB     3 g 200 mL/hr over 30 Minutes Intravenous  Once 11/06/19 0754 11/06/19 0840   11/04/19 2200  cefdinir (OMNICEF) capsule 300 mg    Note to Pharmacy: DS 10     300 mg Oral 2 times daily 11/04/19 1651 11/04/19 2138       Subjective: Seen and examined.  No overnight events.  She was seen before going for procedure and denied any complaints.  Objective: Vitals:   11/08/19 0951 11/08/19 0955 11/08/19 1000 11/08/19 1040  BP: (!) 146/64 138/84 139/83 126/69  Pulse: 91 96 99 81  Resp: 16 11 (!) 28 18  Temp:    97.6 F (36.4 C)  TempSrc:    Axillary  SpO2: 100% 96% 99% 99%  Weight:      Height:        Intake/Output Summary (Last 24 hours) at 11/08/2019 1345 Last data filed at 11/08/2019 0840 Gross per 24 hour  Intake 1911.66 ml  Output 875 ml  Net 1036.66 ml  Filed Weights   11/05/19 0912  Weight: 86.2 kg    Examination:  General exam: Appears calm and comfortable.  Comfortable on room air. Pulmonary: Bilateral clear CV: Regular rate rhythm.  No added sounds.  No murmurs Extremities: Nontender.  No edema. Gastrointestinal system: Abdomen is nondistended, soft and nontender.  Bile drain with freeflowing bile.  Data  Reviewed: I have personally reviewed following labs and imaging studies  CBC: Recent Labs  Lab 11/03/19 1432 11/03/19 1810 11/04/19 1055 11/05/19 0249 11/07/19 0231  WBC 5.5 6.0 5.7 4.8 8.5  NEUTROABS 3.7  --  3.2  --  7.4  HGB 11.1* 11.0* 11.0* 9.8* 10.3*  HCT 33.3* 35.1* 35.0* 30.4* 31.5*  MCV 91.4 95.4 94.3 92.7 91.0  PLT 329.0 358 345 208 [id number]   Basic Metabolic Panel: Recent Labs  Lab 11/03/19 1810 11/04/19 1055 11/05/19 0249 11/07/19 0231 11/08/19 0409  NA 135 140 138 137 139  K 4.2 3.8 3.8 4.7 4.6  CL 105 109 109 109 113*  CO2 21* 20* 20* 21* 20*  GLUCOSE 92 110* 83 147* 158*  BUN 11 11 10 15 19   CREATININE 1.29* 1.26* 1.08* 1.18* 1.22*  CALCIUM 9.8 10.1 9.5 9.6 9.8  MG  --   --   --  1.6*  --   PHOS  --   --   --  3.5  --    GFR: Estimated Creatinine Clearance: 37 mL/min (A) (by C-G formula based on SCr of 1.22 mg/dL (H)). Liver Function Tests: Recent Labs  Lab 11/03/19 1810 11/04/19 1055 11/05/19 0249 11/07/19 0231 11/08/19 0409  AST 319* 274* 220* 149* 60*  ALT 124* 112* 90* 86* 65*  ALKPHOS 1,110* 1,178* 991* 1,033* 810*  BILITOT 4.9* 5.4* 5.4* 3.0* 2.4*  PROT 5.0* 5.0* 4.2* 4.7* 4.6*  ALBUMIN 2.2* 2.0* 1.8* 2.0* 2.0*   Recent Labs  Lab 11/03/19 1432 11/03/19 1810  LIPASE 504.0* 213*   Recent Labs  Lab 11/04/19 1055  AMMONIA 50*   Coagulation Profile: Recent Labs  Lab 11/03/19 1432 11/04/19 1055  INR 1.4* 1.2   Cardiac Enzymes: No results for input(s): CKTOTAL, CKMB, CKMBINDEX, TROPONINI in the last 168 hours. BNP (last 3 results) No results for input(s): PROBNP in the last 8760 hours. HbA1C: No results for input(s): HGBA1C in the last 72 hours. CBG: Recent Labs  Lab 11/05/19 0017 11/05/19 0332 11/05/19 0724 11/05/19 1142 11/06/19 0751  GLUCAP 82 71 70 76 135*   Lipid Profile: No results for input(s): CHOL, HDL, LDLCALC, TRIG, CHOLHDL, LDLDIRECT in the last 72 hours. Thyroid Function Tests: No results for input(s):  TSH, T4TOTAL, FREET4, T3FREE, THYROIDAB in the last 72 hours. Anemia Panel: No results for input(s): VITAMINB12, FOLATE, FERRITIN, TIBC, IRON, RETICCTPCT in the last 72 hours. Sepsis Labs: No results for input(s): PROCALCITON, LATICACIDVEN in the last 168 hours.  Recent Results (from the past 240 hour(s))  Respiratory Panel by RT PCR (Flu A&B, Covid) - Nasopharyngeal Swab     Status: None   Collection Time: 11/04/19 10:50 AM   Specimen: Nasopharyngeal Swab  Result Value Ref Range Status   SARS Coronavirus 2 by RT PCR NEGATIVE NEGATIVE Final    Comment: (NOTE) SARS-CoV-2 target nucleic acids are NOT DETECTED. The SARS-CoV-2 RNA is generally detectable in upper respiratoy specimens during the acute phase of infection. The lowest concentration of SARS-CoV-2 viral copies this assay can detect is 131 copies/mL. A negative result does not preclude SARS-Cov-2 infection and should not be used as  the sole basis for treatment or other patient management decisions. A negative result may occur with  improper specimen collection/handling, submission of specimen other than nasopharyngeal swab, presence of viral mutation(s) within the areas targeted by this assay, and inadequate number of viral copies (<131 copies/mL). A negative result must be combined with clinical observations, patient history, and epidemiological information. The expected result is Negative. Fact Sheet for Patients:  PinkCheek.be Fact Sheet for Healthcare Providers:  GravelBags.it This test is not yet ap proved or cleared by the [place] FDA and  has been authorized for detection and/or diagnosis of SARS-CoV-2 by FDA under an Emergency Use Authorization (EUA). This EUA will remain  in effect (meaning this test can be used) for the duration of the COVID-19 declaration under Section 564(b)(1) of the Act, 21 U.S.C. section 360bbb-3(b)(1), unless the authorization is  terminated or revoked sooner.    Influenza A by PCR NEGATIVE NEGATIVE Final   Influenza B by PCR NEGATIVE NEGATIVE Final    Comment: (NOTE) The Xpert Xpress SARS-CoV-2/FLU/RSV assay is intended as an aid in  the diagnosis of influenza from Nasopharyngeal swab specimens and  should not be used as a sole basis for treatment. Nasal washings and  aspirates are unacceptable for Xpert Xpress SARS-CoV-2/FLU/RSV  testing. Fact Sheet for Patients: PinkCheek.be Fact Sheet for Healthcare Providers: GravelBags.it This test is not yet approved or cleared by the [place] FDA and  has been authorized for detection and/or diagnosis of SARS-CoV-2 by  FDA under an Emergency Use Authorization (EUA). This EUA will remain  in effect (meaning this test can be used) for the duration of the  Covid-19 declaration under Section 564(b)(1) of the Act, 21  U.S.C. section 360bbb-3(b)(1), unless the authorization is  terminated or revoked. Performed at [facility], [place] [address] [place], [place] [address]   Aerobic/Anaerobic Culture (surgical/deep wound)     Status: None (Preliminary result)   Collection Time: 11/06/19  3:25 PM   Specimen: BILE  Result Value Ref Range Status   Specimen Description BILE  Final   Special Requests NONE  Final   Gram Stain   Final    MODERATE WBC PRESENT, PREDOMINANTLY MONONUCLEAR NO ORGANISMS SEEN    Culture   Final    NO GROWTH 2 DAYS NO ANAEROBES ISOLATED; CULTURE IN PROGRESS FOR 5 DAYS Performed at [facility], [place] [address] [place], [place] [address]    Report Status PENDING  Incomplete         Radiology Studies: IR BILIARY DRAIN PLACEMENT WITH CHOLANGIOGRAM  Result Date: 11/06/2019 INDICATION: 85 year old with biliary obstruction and pancreatic lesion. Biliary system could not be decompressed with ERCP. History of gastrojejunostomy. Patient presents for percutaneous transhepatic  cholangiogram and biliary drain placement. EXAM: PERCUTANEOUS TRANSHEPATIC CHOLANGIOGRAM WITH ULTRASOUND AND FLUOROSCOPIC GUIDANCE PLACEMENT OF EXTERNAL BILIARY DRAIN MEDICATIONS: 125 mg Solu-Medrol. Steroid was given due to contrast allergy. Patient was pre-medicated earlier in the day for the ERCP study. ANESTHESIA/SEDATION: Moderate (conscious) sedation was employed during this procedure. A total of Versed 3.0 mg and Dilaudid 2.0 mg was administered intravenously. Moderate Sedation Time: 64 minutes. The patient's level of consciousness and vital signs were monitored continuously by radiology nursing throughout the procedure under my direct supervision. FLUOROSCOPY TIME:  Fluoroscopy Time: 10 minutes, 30 seconds, 107 mGy CONTRAST:  25 mL Omnipaque [id number] system COMPLICATIONS: None immediate. PROCEDURE: Informed written consent was obtained from the patient after a thorough discussion of the procedural risks, benefits and alternatives. All  questions were addressed. Maximal Sterile Barrier Technique was utilized including caps, mask, sterile gowns, sterile gloves, sterile drape, hand hygiene and skin antiseptic. A timeout was performed prior to the initiation of the procedure. The abdomen was prepped and draped in sterile fashion. Ultrasound was used to evaluate the liver. Left lateral hepatic lobe was targeted for percutaneous access. The anterior abdomen was prepped with 1% lidocaine. Using ultrasound guidance, 21 gauge needle was directed into a dilated peripheral bile duct. Contrast injection confirmed placement in the biliary system. 0.018 wire was advanced and a transitional dilator set was placed. Five French catheter and Glidewire were advanced to the distal common bile duct. Additional cholangiograms were performed. Eventually, the Glidewire was able to pass the obstruction in the distal common bile duct. Catheter was advanced into the duodenum with difficulty. Tortuosity of the distal common bile duct  made access into the duodenum difficult. A stiff Glidewire was placed. The tract was dilated and a 10.2 [place] biliary drain was advanced over the wire. The drain easily advanced into the liver but would not advance across the distal biliary obstruction. At this point, the patient was very uncomfortable and moving on the table. I was unable to exchange the 10 [place] biliary tube for a smaller tube or try to dilate the obstruction due to patient's discomfort. Therefore, the catheter was advanced into the biliary system and the tip was placed in the distal common bile duct. Contrast injection confirmed that the tube was well positioned within the biliary system. Bile was aspirated. Sample of the bile was sent for culture and cytology. Catheter was sutured to skin and attached to a gravity bag. Dressing was placed over the tube. FINDINGS: Severe intrahepatic and extrahepatic biliary dilatation. A dilated peripheral bile duct was accessed in the lateral left hepatic lobe. There was complete obstruction in the distal common bile duct. Catheter and wire were advanced across the obstruction but the biliary drain could not be advanced due to patient's discomfort. IMPRESSION: 1. Severe intrahepatic and extrahepatic biliary dilatation due to obstruction in the distal common bile duct. 2. Successful placement of an external biliary drain. Catheter tip in the distal common bile duct. 3. Will plan to convert the external biliary drain to an internal-external biliary drain in the next few days. If the patient still needs a tissue diagnosis, we may be able to perform brush biopsies at the same time. Electronically Signed   By: Markus Daft M.D.   On: 11/06/2019 17:52   IR INT EXT BILIARY DRAIN WITH CHOLANGIOGRAM  Result Date: 11/08/2019 INDICATION: 85 year old female with a history obstructed common bile duct, adenocarcinoma positive on brush biopsy EXAM: EXCHANGE OF BILIARY DRAIN WITH PLACEMENT OF INTERNAL/EXTERNAL BILIARY DRAIN  BALLOON ANGIOPLASTY OF MALIGNANT STRICTURE OF THE COMMON BILE DUCT MEDICATIONS: None ANESTHESIA/SEDATION: Moderate (conscious) sedation was employed during this procedure. A total of Versed 5.0 mg and 2.5 mg Dilaudid was administered intravenously. Moderate Sedation Time: 37 minutes. The patient's level of consciousness and vital signs were monitored continuously by radiology nursing throughout the procedure under my direct supervision. FLUOROSCOPY TIME:  Fluoroscopy Time: 8 minutes 18 seconds (123.7 mGy). COMPLICATIONS: None PROCEDURE: Informed written consent was obtained from the patient after a thorough discussion of the procedural risks, benefits and alternatives. All questions were addressed. Maximal Sterile Barrier Technique was utilized including caps, mask, sterile gowns, sterile gloves, sterile drape, hand hygiene and skin antiseptic. A timeout was performed prior to the initiation of the procedure. Patient positioned supine position on the  fluoroscopy table. Scout images were acquired. The upper abdomen and the indwelling drain were prepped and draped in the usual sterile fashion. 1% lidocaine was used for local anesthesia. The indwelling catheter from a left-sided approach was ligated and a Bentson wire was passed through the catheter into the dilated common hepatic duct. A 9 French short sheath was placed on the Bentson wire into left-sided ductal system. Combination of a Glidewire and a Kumpe the catheter were used to navigate through the malignant obstruction, into the duodenum. Gentle contrast injection confirmed location the duodenum. Rose in wire was placed in the duodenum. Eight French 55 cm introducer from a sheath was advanced over the wire through the malignant stricture for dilation. They internal external biliary drain was then attempted to pass on the Rose an wire encountering resistance at the malignant stricture. Balloon angioplasty was then performed with a 4 mm x 40 mm balloon through  the common bile duct and common hepatic duct. Attempt at placement of the drain was unsuccessful encountering resistance at the malignant stricture. We then selected 6 mm x 40 mm balloon, passed on the Rose an wire through the stricture into the duodenum. We then exchanged the [facility] an wire for an Amplatz wire, placed into the duodenum. Balloon angioplasty of the malignant stricture was then performed with the 6 mm balloon. Balloon was removed and then the 12 French internal/external drain was placed into the duodenum. Contrast injection confirmed location. The drain was sutured in position and attached to gravity drainage. IMPRESSION: Status post exchange of internal biliary drain for a new 12 [place] internal/external biliary drain, after balloon angioplasty of malignant stricture of the common bile duct, 6 mm. Signed, Dulcy Fanny. Dellia Nims, RPVI [facility] Radiology Electronically Signed   By: Corrie Mckusick D.O.   On: 11/08/2019 12:38   IR BALLOON DILATION OF BILIARY DUCTS/AMPULLA  Result Date: 11/08/2019 INDICATION: 85 year old female with a history obstructed common bile duct, adenocarcinoma positive on brush biopsy EXAM: EXCHANGE OF BILIARY DRAIN WITH PLACEMENT OF INTERNAL/EXTERNAL BILIARY DRAIN BALLOON ANGIOPLASTY OF MALIGNANT STRICTURE OF THE COMMON BILE DUCT MEDICATIONS: None ANESTHESIA/SEDATION: Moderate (conscious) sedation was employed during this procedure. A total of Versed 5.0 mg and 2.5 mg Dilaudid was administered intravenously. Moderate Sedation Time: 37 minutes. The patient's level of consciousness and vital signs were monitored continuously by radiology nursing throughout the procedure under my direct supervision. FLUOROSCOPY TIME:  Fluoroscopy Time: 8 minutes 18 seconds (123.7 mGy). COMPLICATIONS: None PROCEDURE: Informed written consent was obtained from the patient after a thorough discussion of the procedural risks, benefits and alternatives.  All questions were addressed. Maximal Sterile Barrier Technique was utilized including caps, mask, sterile gowns, sterile gloves, sterile drape, hand hygiene and skin antiseptic. A timeout was performed prior to the initiation of the procedure. Patient positioned supine position on the fluoroscopy table. Scout images were acquired. The upper abdomen and the indwelling drain were prepped and draped in the usual sterile fashion. 1% lidocaine was used for local anesthesia. The indwelling catheter from a left-sided approach was ligated and a Bentson wire was passed through the catheter into the dilated common hepatic duct. A 9 French short sheath was placed on the Bentson wire into left-sided ductal system. Combination of a Glidewire and a Kumpe the catheter were used to navigate through the malignant obstruction, into the duodenum. Gentle contrast injection confirmed location the duodenum. Rose in wire was placed in the duodenum. Eight French 55 cm introducer from a sheath  was advanced over the wire through the malignant stricture for dilation. They internal external biliary drain was then attempted to pass on the Rose an wire encountering resistance at the malignant stricture. Balloon angioplasty was then performed with a 4 mm x 40 mm balloon through the common bile duct and common hepatic duct. Attempt at placement of the drain was unsuccessful encountering resistance at the malignant stricture. We then selected 6 mm x 40 mm balloon, passed on the Rose an wire through the stricture into the duodenum. We then exchanged the [facility] an wire for an Amplatz wire, placed into the duodenum. Balloon angioplasty of the malignant stricture was then performed with the 6 mm balloon. Balloon was removed and then the 12 French internal/external drain was placed into the duodenum. Contrast injection confirmed location. The drain was sutured in position and attached to gravity drainage. IMPRESSION: Status post exchange of internal  biliary drain for a new 12 [place] internal/external biliary drain, after balloon angioplasty of malignant stricture of the common bile duct, 6 mm. Signed, Dulcy Fanny. Dellia Nims, RPVI [facility] Radiology Electronically Signed   By: Corrie Mckusick D.O.   On: 11/08/2019 12:38        Scheduled Meds: . dorzolamide  1 drop Both Eyes BID  . [START ON 11/09/2019] enoxaparin (LOVENOX) injection  40 mg Subcutaneous Q24H  . feeding supplement (ENSURE ENLIVE)  237 mL Oral BID BM  . gabapentin  100 mg Oral TID  . HYDROmorphone      . HYDROmorphone      . indomethacin  100 mg Rectal Once  . ketorolac      . levothyroxine  50 mcg Oral Daily  . lidocaine      . midazolam      . mirtazapine  15 mg Oral QHS  . multivitamin with minerals  1 tablet Oral Daily  . Netarsudil-Latanoprost  1 drop Both Eyes QHS  . pantoprazole  40 mg Oral Daily  . predniSONE  50 mg Oral Once  . sodium chloride flush  3 mL Intravenous Q12H  . sodium chloride flush  5 mL Intracatheter Q12H   Continuous Infusions: . sodium chloride 50 mL/hr at 11/07/19 1432     LOS: 4 days    Time spent: 25 minutes    Barb Merino, MD [facility] Pager [phone]

## 2019-11-08 NOTE — Procedures (Signed)
Interventional Radiology Procedure Note  Procedure: Image guided exchange of biliary drain, with PTA of malignant CBD stricture (76mm & 37mm), and placement of 46F Int/Ext Biliary drain.  To gravity  Previous cytology sample confirms malignancy.  Confirmed with GI team that no biopsy necessary today.   Complications: None  Recommendations:  - To gravity.  May attempt capping trial before DC home - Initiate routine exchanges for future - Do not submerge - Routine wound care   Signed,  Dulcy Fanny. Earleen Newport, DO

## 2019-11-09 ENCOUNTER — Encounter: Payer: Self-pay | Admitting: Gastroenterology

## 2019-11-09 LAB — COMPREHENSIVE METABOLIC PANEL
ALT: 52 U/L — ABNORMAL HIGH (ref 0–44)
AST: 64 U/L — ABNORMAL HIGH (ref 15–41)
Albumin: 2.2 g/dL — ABNORMAL LOW (ref 3.5–5.0)
Alkaline Phosphatase: 821 U/L — ABNORMAL HIGH (ref 38–126)
Anion gap: 9 (ref 5–15)
BUN: 25 mg/dL — ABNORMAL HIGH (ref 8–23)
CO2: 19 mmol/L — ABNORMAL LOW (ref 22–32)
Calcium: 9.8 mg/dL (ref 8.9–10.3)
Chloride: 110 mmol/L (ref 98–111)
Creatinine, Ser: 1.24 mg/dL — ABNORMAL HIGH (ref 0.44–1.00)
GFR calc Af Amer: 47 mL/min — ABNORMAL LOW (ref >60)
GFR calc non Af Amer: 40 mL/min — ABNORMAL LOW (ref >60)
Glucose, Bld: 139 mg/dL — ABNORMAL HIGH (ref 70–99)
Potassium: 5.2 mmol/L — ABNORMAL HIGH (ref 3.5–5.1)
Sodium: 138 mmol/L (ref 135–145)
Total Bilirubin: 2.8 mg/dL — ABNORMAL HIGH (ref 0.3–1.2)
Total Protein: 4.9 g/dL — ABNORMAL LOW (ref 6.5–8.1)

## 2019-11-09 LAB — LIPASE, BLOOD: Lipase: 1113 U/L — ABNORMAL HIGH (ref 11–51)

## 2019-11-09 MED ORDER — HYDROMORPHONE HCL 1 MG/ML IJ SOLN
0.5000 mg | Freq: Once | INTRAMUSCULAR | Status: AC
  Administered 2019-11-09: 06:00:00 0.5 mg via INTRAVENOUS
  Filled 2019-11-09: qty 1

## 2019-11-09 MED ORDER — GUAIFENESIN 100 MG/5ML PO SOLN
5.0000 mL | ORAL | Status: DC | PRN
  Filled 2019-11-09: qty 5

## 2019-11-09 MED ORDER — HYDROMORPHONE HCL 1 MG/ML IJ SOLN
0.5000 mg | INTRAMUSCULAR | Status: DC | PRN
  Administered 2019-11-09 (×3): 0.5 mg via INTRAVENOUS
  Filled 2019-11-09 (×3): qty 1

## 2019-11-09 MED ORDER — LACTATED RINGERS IV SOLN: INTRAVENOUS | Status: AC

## 2019-11-09 NOTE — Progress Notes (Signed)
PROGRESS NOTE    [name]  [id number] DOB: 1935-05-19 DOA: 11/04/2019 PCP: Burnard Bunting, MD    Brief Narrative:  85 year old female with history of hypertension, hyperlipidemia, diet-controlled diabetes, hyperparathyroidism status post parathyroidectomy, obesity and sleep apnea.  Recently treated for pneumonia.  History of gastrojejunostomy for a stricture and J-tube placement who was following outpatient and found to have painless jaundice on routine evaluation. She was been evaluated for outpatient MRCP, however repeat lab work showed worsening liver enzymes and was brought to the hospital. In the emergency room, hemodynamically stable.  Bilirubin 5.4.  AST/ALT, 274/112.  CT scan abdomen showed severe intrahepatic and common bile duct dilatation. MRI, MRCP showed diffuse biliary and pancreatic ductal dilation and 2 cm lesion in the pancreatic head. ERCP and biopsy consistent with adenocarcinoma of the pancreas.  Assessment & Plan:   Principal Problem:   Painless jaundice Active Problems:   Hypertension   CKD (chronic kidney disease), stage III   Hypothyroid   Idiopathic peripheral neuropathy   Elevated liver enzymes   Pleural effusion, bilateral   Pancreatic mass   Biliary obstruction  Painless jaundice, pancreatic head mass: found to have pancreatic adenocarcinoma. - Status post ERCP, EUS and pancreatic mass biopsy by GI 2/4.  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  Pathology consistent with adenocarcinoma. - Status post percutaneous biliary drain by interventional radiology 2/4 , changed to external/internal drain 2/6. - Developed post procedure pancreatitis with severe abdominal pain and nausea today.   Adequate pain medications. Continue IV fluids. Continue monitoring LFTs and lipase.  Acute biliary pancreatitis: Lipase 1100.  Severe pain and vomiting.  IV fluids, clears, adequate IV opiates.  Hypertension: Blood  pressures are stable.  Chronic kidney disease stage IIIb: Stable.  On maintenance fluid.    Hypothyroidism: On Synthroid.  Continue.   DVT prophylaxis: Lovenox Code Status: Full code Family Communication: Husband on the phone, 2/6.  Will talk to him today. Disposition Plan: patient is from home. Anticipated DC to home, Barriers to discharge, on active treatment.   Now developed pancreatitis and severe abdominal pain.   Consultants:   Gastroenterology  Interventional radiology  Procedures:   ERCP/EUS and biopsy  Percutaneous biliary drain  Internal and external drain  Antimicrobials:  Anti-infectives (From admission, onward)   Start     Dose/Rate Route Frequency Ordered Stop   11/06/19 1700  Ampicillin-Sulbactam (UNASYN) 3 g in sodium chloride 0.9 % 100 mL IVPB     3 g 200 mL/hr over 30 Minutes Intravenous  Once 11/06/19 1542 11/06/19 1707   11/06/19 0830  Ampicillin-Sulbactam (UNASYN) 3 g in sodium chloride 0.9 % 100 mL IVPB     3 g 200 mL/hr over 30 Minutes Intravenous  Once 11/06/19 0754 11/06/19 0840   11/04/19 2200  cefdinir (OMNICEF) capsule 300 mg    Note to Pharmacy: DS 10     300 mg Oral 2 times daily 11/04/19 1651 11/04/19 2138       Subjective: Patient seen and examined.  No overnight events.  Early morning today she started having severe abdomen pain, she never had this type of pain in the past.  Nauseated with no vomiting.  Coughing.  Afebrile.  Objective: Vitals:   11/08/19 1040 11/08/19 1514 11/08/19 2129 11/09/19 0457  BP: 126/69 112/69 120/62 121/73  Pulse: 81 64 88 87  Resp: 18 18 19 15   Temp: 97.6 F (36.4 C) 97.6 F (36.4 C) 98.2 F (36.8 C)  98.6 F (37 C)  TempSrc: Axillary Axillary Oral Oral  SpO2: 99% 100% 96% 94%  Weight:      Height:        Intake/Output Summary (Last 24 hours) at 11/09/2019 1112 Last data filed at 11/09/2019 0538 Gross per 24 hour  Intake 10 ml  Output 575 ml  Net -565 ml   Filed Weights   11/05/19 0912    Weight: 86.2 kg    Examination:  General exam: Patient in severe abdominal pain on exam, distressed. Anxious. Pulmonary: Bilateral clear CV: Regular rate rhythm.  No added sounds.  No murmurs Extremities: Nontender.  No edema. Gastrointestinal system: Abdomen is nondistended, soft and mild tender along the biliary tube. Biliary tube with minimal turbid bile with sediments, freely flowing.  Data Reviewed: I have personally reviewed following labs and imaging studies  CBC: Recent Labs  Lab 11/03/19 1432 11/03/19 1810 11/04/19 1055 11/05/19 0249 11/07/19 0231  WBC 5.5 6.0 5.7 4.8 8.5  NEUTROABS 3.7  --  3.2  --  7.4  HGB 11.1* 11.0* 11.0* 9.8* 10.3*  HCT 33.3* 35.1* 35.0* 30.4* 31.5*  MCV 91.4 95.4 94.3 92.7 91.0  PLT 329.0 358 345 208 [id number]   Basic Metabolic Panel: Recent Labs  Lab 11/04/19 1055 11/05/19 0249 11/07/19 0231 11/08/19 0409 11/09/19 0309  NA 140 138 137 139 138  K 3.8 3.8 4.7 4.6 5.2*  CL 109 109 109 113* 110  CO2 20* 20* 21* 20* 19*  GLUCOSE 110* 83 147* 158* 139*  BUN 11 10 15 19  25*  CREATININE 1.26* 1.08* 1.18* 1.22* 1.24*  CALCIUM 10.1 9.5 9.6 9.8 9.8  MG  --   --  1.6*  --   --   PHOS  --   --  3.5  --   --    GFR: Estimated Creatinine Clearance: 36.4 mL/min (A) (by C-G formula based on SCr of 1.24 mg/dL (H)). Liver Function Tests: Recent Labs  Lab 11/04/19 1055 11/05/19 0249 11/07/19 0231 11/08/19 0409 11/09/19 0309  AST 274* 220* 149* 60* 64*  ALT 112* 90* 86* 65* 52*  ALKPHOS 1,178* 991* 1,033* 810* 821*  BILITOT 5.4* 5.4* 3.0* 2.4* 2.8*  PROT 5.0* 4.2* 4.7* 4.6* 4.9*  ALBUMIN 2.0* 1.8* 2.0* 2.0* 2.2*   Recent Labs  Lab 11/03/19 1432 11/03/19 1810 11/09/19 0309  LIPASE 504.0* 213* 1,113*   Recent Labs  Lab 11/04/19 1055  AMMONIA 50*   Coagulation Profile: Recent Labs  Lab 11/03/19 1432 11/04/19 1055  INR 1.4* 1.2   Cardiac Enzymes: No results for input(s): CKTOTAL, CKMB, CKMBINDEX, TROPONINI in the last 168  hours. BNP (last 3 results) No results for input(s): PROBNP in the last 8760 hours. HbA1C: No results for input(s): HGBA1C in the last 72 hours. CBG: Recent Labs  Lab 11/05/19 0017 11/05/19 0332 11/05/19 0724 11/05/19 1142 11/06/19 0751  GLUCAP 82 71 70 76 135*   Lipid Profile: No results for input(s): CHOL, HDL, LDLCALC, TRIG, CHOLHDL, LDLDIRECT in the last 72 hours. Thyroid Function Tests: No results for input(s): TSH, T4TOTAL, FREET4, T3FREE, THYROIDAB in the last 72 hours. Anemia Panel: No results for input(s): VITAMINB12, FOLATE, FERRITIN, TIBC, IRON, RETICCTPCT in the last 72 hours. Sepsis Labs: No results for input(s): PROCALCITON, LATICACIDVEN in the last 168 hours.  Recent Results (from the past 240 hour(s))  Respiratory Panel by RT PCR (Flu A&B, Covid) - Nasopharyngeal Swab     Status: None   Collection Time: 11/04/19 10:50 AM  Specimen: Nasopharyngeal Swab  Result Value Ref Range Status   SARS Coronavirus 2 by RT PCR NEGATIVE NEGATIVE Final    Comment: (NOTE) SARS-CoV-2 target nucleic acids are NOT DETECTED. The SARS-CoV-2 RNA is generally detectable in upper respiratoy specimens during the acute phase of infection. The lowest concentration of SARS-CoV-2 viral copies this assay can detect is 131 copies/mL. A negative result does not preclude SARS-Cov-2 infection and should not be used as the sole basis for treatment or other patient management decisions. A negative result may occur with  improper specimen collection/handling, submission of specimen other than nasopharyngeal swab, presence of viral mutation(s) within the areas targeted by this assay, and inadequate number of viral copies (<131 copies/mL). A negative result must be combined with clinical observations, patient history, and epidemiological information. The expected result is Negative. Fact Sheet for Patients:  PinkCheek.be Fact Sheet for Healthcare Providers:   GravelBags.it This test is not yet ap proved or cleared by the [place] FDA and  has been authorized for detection and/or diagnosis of SARS-CoV-2 by FDA under an Emergency Use Authorization (EUA). This EUA will remain  in effect (meaning this test can be used) for the duration of the COVID-19 declaration under Section 564(b)(1) of the Act, 21 U.S.C. section 360bbb-3(b)(1), unless the authorization is terminated or revoked sooner.    Influenza A by PCR NEGATIVE NEGATIVE Final   Influenza B by PCR NEGATIVE NEGATIVE Final    Comment: (NOTE) The Xpert Xpress SARS-CoV-2/FLU/RSV assay is intended as an aid in  the diagnosis of influenza from Nasopharyngeal swab specimens and  should not be used as a sole basis for treatment. Nasal washings and  aspirates are unacceptable for Xpert Xpress SARS-CoV-2/FLU/RSV  testing. Fact Sheet for Patients: PinkCheek.be Fact Sheet for Healthcare Providers: GravelBags.it This test is not yet approved or cleared by the [place] FDA and  has been authorized for detection and/or diagnosis of SARS-CoV-2 by  FDA under an Emergency Use Authorization (EUA). This EUA will remain  in effect (meaning this test can be used) for the duration of the  Covid-19 declaration under Section 564(b)(1) of the Act, 21  U.S.C. section 360bbb-3(b)(1), unless the authorization is  terminated or revoked. Performed at [facility], [place] [address], [place], [place] [address]   Aerobic/Anaerobic Culture (surgical/deep wound)     Status: None (Preliminary result)   Collection Time: 11/06/19  3:25 PM   Specimen: BILE  Result Value Ref Range Status   Specimen Description BILE  Final   Special Requests NONE  Final   Gram Stain   Final    MODERATE WBC PRESENT, PREDOMINANTLY MONONUCLEAR NO ORGANISMS SEEN    Culture   Final    NO GROWTH 2 DAYS NO ANAEROBES ISOLATED; CULTURE IN  PROGRESS FOR 5 DAYS Performed at [facility], [place] [address], [place], [place] [address]    Report Status PENDING  Incomplete         Radiology Studies: IR INT EXT BILIARY DRAIN WITH CHOLANGIOGRAM  Result Date: 11/08/2019 INDICATION: 85 year old female with a history obstructed common bile duct, adenocarcinoma positive on brush biopsy EXAM: EXCHANGE OF BILIARY DRAIN WITH PLACEMENT OF INTERNAL/EXTERNAL BILIARY DRAIN BALLOON ANGIOPLASTY OF MALIGNANT STRICTURE OF THE COMMON BILE DUCT MEDICATIONS: None ANESTHESIA/SEDATION: Moderate (conscious) sedation was employed during this procedure. A total of Versed 5.0 mg and 2.5 mg Dilaudid was administered intravenously. Moderate Sedation Time: 37 minutes. The patient's level of consciousness and vital signs were monitored continuously by radiology  nursing throughout the procedure under my direct supervision. FLUOROSCOPY TIME:  Fluoroscopy Time: 8 minutes 18 seconds (123.7 mGy). COMPLICATIONS: None PROCEDURE: Informed written consent was obtained from the patient after a thorough discussion of the procedural risks, benefits and alternatives. All questions were addressed. Maximal Sterile Barrier Technique was utilized including caps, mask, sterile gowns, sterile gloves, sterile drape, hand hygiene and skin antiseptic. A timeout was performed prior to the initiation of the procedure. Patient positioned supine position on the fluoroscopy table. Scout images were acquired. The upper abdomen and the indwelling drain were prepped and draped in the usual sterile fashion. 1% lidocaine was used for local anesthesia. The indwelling catheter from a left-sided approach was ligated and a Bentson wire was passed through the catheter into the dilated common hepatic duct. A 9 French short sheath was placed on the Bentson wire into left-sided ductal system. Combination of a Glidewire and a Kumpe the catheter were used to navigate through the malignant obstruction, into the  duodenum. Gentle contrast injection confirmed location the duodenum. Rose in wire was placed in the duodenum. Eight French 55 cm introducer from a sheath was advanced over the wire through the malignant stricture for dilation. They internal external biliary drain was then attempted to pass on the Rose an wire encountering resistance at the malignant stricture. Balloon angioplasty was then performed with a 4 mm x 40 mm balloon through the common bile duct and common hepatic duct. Attempt at placement of the drain was unsuccessful encountering resistance at the malignant stricture. We then selected 6 mm x 40 mm balloon, passed on the Rose an wire through the stricture into the duodenum. We then exchanged the [facility] an wire for an Amplatz wire, placed into the duodenum. Balloon angioplasty of the malignant stricture was then performed with the 6 mm balloon. Balloon was removed and then the 12 French internal/external drain was placed into the duodenum. Contrast injection confirmed location. The drain was sutured in position and attached to gravity drainage. IMPRESSION: Status post exchange of internal biliary drain for a new 12 [place] internal/external biliary drain, after balloon angioplasty of malignant stricture of the common bile duct, 6 mm. Signed, Dulcy Fanny. Dellia Nims, RPVI [facility] 2 Radiology Electronically Signed   By: Corrie Mckusick D.O.   On: 11/08/2019 12:38   IR BALLOON DILATION OF BILIARY DUCTS/AMPULLA  Result Date: 11/08/2019 INDICATION: 85 year old female with a history obstructed common bile duct, adenocarcinoma positive on brush biopsy EXAM: EXCHANGE OF BILIARY DRAIN WITH PLACEMENT OF INTERNAL/EXTERNAL BILIARY DRAIN BALLOON ANGIOPLASTY OF MALIGNANT STRICTURE OF THE COMMON BILE DUCT MEDICATIONS: None ANESTHESIA/SEDATION: Moderate (conscious) sedation was employed during this procedure. A total of Versed 5.0 mg and 2.5 mg Dilaudid was administered  intravenously. Moderate Sedation Time: 37 minutes. The patient's level of consciousness and vital signs were monitored continuously by radiology nursing throughout the procedure under my direct supervision. FLUOROSCOPY TIME:  Fluoroscopy Time: 8 minutes 18 seconds (123.7 mGy). COMPLICATIONS: None PROCEDURE: Informed written consent was obtained from the patient after a thorough discussion of the procedural risks, benefits and alternatives. All questions were addressed. Maximal Sterile Barrier Technique was utilized including caps, mask, sterile gowns, sterile gloves, sterile drape, hand hygiene and skin antiseptic. A timeout was performed prior to the initiation of the procedure. Patient positioned supine position on the fluoroscopy table. Scout images were acquired. The upper abdomen and the indwelling drain were prepped and draped in the usual sterile fashion. 1% lidocaine was used for local anesthesia.  The indwelling catheter from a left-sided approach was ligated and a Bentson wire was passed through the catheter into the dilated common hepatic duct. A 9 French short sheath was placed on the Bentson wire into left-sided ductal system. Combination of a Glidewire and a Kumpe the catheter were used to navigate through the malignant obstruction, into the duodenum. Gentle contrast injection confirmed location the duodenum. Rose in wire was placed in the duodenum. Eight French 55 cm introducer from a sheath was advanced over the wire through the malignant stricture for dilation. They internal external biliary drain was then attempted to pass on the Rose an wire encountering resistance at the malignant stricture. Balloon angioplasty was then performed with a 4 mm x 40 mm balloon through the common bile duct and common hepatic duct. Attempt at placement of the drain was unsuccessful encountering resistance at the malignant stricture. We then selected 6 mm x 40 mm balloon, passed on the Rose an wire through the stricture  into the duodenum. We then exchanged the [facility] an wire for an Amplatz wire, placed into the duodenum. Balloon angioplasty of the malignant stricture was then performed with the 6 mm balloon. Balloon was removed and then the 12 French internal/external drain was placed into the duodenum. Contrast injection confirmed location. The drain was sutured in position and attached to gravity drainage. IMPRESSION: Status post exchange of internal biliary drain for a new 12 [place] internal/external biliary drain, after balloon angioplasty of malignant stricture of the common bile duct, 6 mm. Signed, Dulcy Fanny. Dellia Nims, RPVI [facility] Radiology Electronically Signed   By: Corrie Mckusick D.O.   On: 11/08/2019 12:38        Scheduled Meds: . dorzolamide  1 drop Both Eyes BID  . enoxaparin (LOVENOX) injection  40 mg Subcutaneous Q24H  . feeding supplement (ENSURE ENLIVE)  237 mL Oral BID BM  . gabapentin  100 mg Oral TID  . indomethacin  100 mg Rectal Once  . levothyroxine  50 mcg Oral Daily  . mirtazapine  15 mg Oral QHS  . multivitamin with minerals  1 tablet Oral Daily  . Netarsudil-Latanoprost  1 drop Both Eyes QHS  . pantoprazole  40 mg Oral Daily  . predniSONE  50 mg Oral Once  . sodium chloride flush  3 mL Intravenous Q12H  . sodium chloride flush  5 mL Intracatheter Q12H   Continuous Infusions: . sodium chloride 75 mL/hr at 11/09/19 1047     LOS: 5 days    Time spent: 25 minutes    Barb Merino, MD [facility] Pager [phone]

## 2019-11-09 NOTE — Progress Notes (Signed)
Referring Physician(s): Mansouraty, Telford Nab.  Supervising Physician: Corrie Mckusick  Patient Status:  [facility] - In-pt  Chief Complaint: None  Subjective:  History of biliary obstruction s/p external biliary drain placement in IR 11/06/2019 by Dr. Anselm Pancoast; s/p internalization in IR 11/08/2019 by Dr. Earleen Newport. Patient awake and alert laying in bed with no complaints at this time. Biliary drain site c/d/i.   Allergies: Codeine, Contrast media [iodinated diagnostic agents], Iohexol, Aspirin, and Fentanyl  Medications: Prior to Admission medications   Medication Sig Start Date End Date Taking? Authorizing Provider  albuterol (VENTOLIN HFA) 108 (90 Base) MCG/ACT inhaler Inhale 2 puffs into the lungs every 6 (six) hours as needed for cough or shortness of breath. 10/07/19  Yes [provider]  benzonatate (TESSALON) 100 MG capsule Take 100 mg by mouth 3 (three) times daily as needed for cough.  06/18/19  Yes [provider]  cefdinir (OMNICEF) 300 MG capsule Take 300 mg by mouth 2 (two) times daily. DS 10 10/30/19  Yes [provider]  dorzolamide (TRUSOPT) 2 % ophthalmic solution Place 1 drop into both eyes 2 (two) times daily.  11/15/15  Yes [provider]  fluticasone (FLONASE) 50 MCG/ACT nasal spray Place 2 sprays into both nostrils daily. Patient taking differently: Place 2 sprays into both nostrils at bedtime as needed for allergies.  09/01/15  Yes Skeet Latch, MD  furosemide (LASIX) 20 MG tablet Take 20 mg by mouth as needed for fluid. Mon, Wed, Fri 07/27/19  Yes [provider]  gabapentin (NEURONTIN) 100 MG capsule Take 100 mg by mouth 3 (three) times daily. 10/16/19  Yes [provider]  levothyroxine (SYNTHROID) 50 MCG tablet Take 50 mcg by mouth daily. 12/18/18  Yes [provider]  mirtazapine (REMERON) 15 MG tablet Take 15 mg by mouth at bedtime. 07/27/19  Yes [provider]  Multiple Vitamins-Minerals  (PRESERVISION AREDS 2+MULTI VIT) CAPS Take 1 capsule by mouth 2 (two) times daily.   Yes [provider]  ondansetron (ZOFRAN) 4 MG tablet Take 1 tablet by mouth every 8 (eight) hours as needed for nausea or vomiting.  05/31/19  Yes [provider]  pantoprazole (PROTONIX) 40 MG tablet Take 40 mg by mouth daily.   Yes [provider]  potassium chloride (KLOR-CON) 10 MEQ tablet Take 10 mEq by mouth. Mon, Wed, Friday, if taking lasix 07/27/19  Yes [provider]  ROCKLATAN 0.02-0.005 % SOLN Place 1 drop into both eyes at bedtime. 01/08/19  Yes [provider]  simethicone (MYLICON) 80 MG chewable tablet Chew 1 tablet (80 mg total) by mouth every 4 (four) hours as needed for flatulence (also available OTC). 05/19/19  Yes Rai, Ripudeep K, MD  losartan (COZAAR) 100 MG tablet TAKE 1 TABLET EACH DAY. Patient taking differently: Take 100 mg by mouth daily.  07/24/16   Skeet Latch, MD  zoledronic acid (RECLAST) 5 MG/100ML SOLN injection Inject 5 mg into the vein yearly. [facility] June 2015)    [provider]     Vital Signs: BP 121/73   Pulse 87   Temp 98.6 F (37 C) (Oral)   Resp 15   Ht 5\' 3"  (1.6 m)   Wt 190 lb 0.6 oz (86.2 kg)   SpO2 94%   BMI 33.66 kg/m   Physical Exam Vitals and nursing note reviewed.  Constitutional:      General: She is not in acute distress.    Appearance: Normal appearance.  Pulmonary:  Effort: Pulmonary effort is normal. No respiratory distress.  Abdominal:     Comments: Biliary drain site without tenderness, erythema, drainage, or active bleeding; approximately 50 cc dark bile with debris in gravity bag.  Skin:    General: Skin is warm and dry.  Neurological:     Mental Status: She is alert.     Imaging: DG C-Arm 1-60 Min-No Report  Result Date: 11/06/2019 Fluoroscopy was utilized by the requesting physician.  No radiographic interpretation.   IR BILIARY DRAIN PLACEMENT WITH  CHOLANGIOGRAM  Result Date: 11/06/2019 INDICATION: 85 year old with biliary obstruction and pancreatic lesion. Biliary system could not be decompressed with ERCP. History of gastrojejunostomy. Patient presents for percutaneous transhepatic cholangiogram and biliary drain placement. EXAM: PERCUTANEOUS TRANSHEPATIC CHOLANGIOGRAM WITH ULTRASOUND AND FLUOROSCOPIC GUIDANCE PLACEMENT OF EXTERNAL BILIARY DRAIN MEDICATIONS: 125 mg Solu-Medrol. Steroid was given due to contrast allergy. Patient was pre-medicated earlier in the day for the ERCP study. ANESTHESIA/SEDATION: Moderate (conscious) sedation was employed during this procedure. A total of Versed 3.0 mg and Dilaudid 2.0 mg was administered intravenously. Moderate Sedation Time: 64 minutes. The patient's level of consciousness and vital signs were monitored continuously by radiology nursing throughout the procedure under my direct supervision. FLUOROSCOPY TIME:  Fluoroscopy Time: 10 minutes, 30 seconds, 107 mGy CONTRAST:  25 mL Omnipaque [id number] system COMPLICATIONS: None immediate. PROCEDURE: Informed written consent was obtained from the patient after a thorough discussion of the procedural risks, benefits and alternatives. All questions were addressed. Maximal Sterile Barrier Technique was utilized including caps, mask, sterile gowns, sterile gloves, sterile drape, hand hygiene and skin antiseptic. A timeout was performed prior to the initiation of the procedure. The abdomen was prepped and draped in sterile fashion. Ultrasound was used to evaluate the liver. Left lateral hepatic lobe was targeted for percutaneous access. The anterior abdomen was prepped with 1% lidocaine. Using ultrasound guidance, 21 gauge needle was directed into a dilated peripheral bile duct. Contrast injection confirmed placement in the biliary system. 0.018 wire was advanced and a transitional dilator set was placed. Five French catheter and Glidewire were advanced to the distal common  bile duct. Additional cholangiograms were performed. Eventually, the Glidewire was able to pass the obstruction in the distal common bile duct. Catheter was advanced into the duodenum with difficulty. Tortuosity of the distal common bile duct made access into the duodenum difficult. A stiff Glidewire was placed. The tract was dilated and a 10.2 [place] biliary drain was advanced over the wire. The drain easily advanced into the liver but would not advance across the distal biliary obstruction. At this point, the patient was very uncomfortable and moving on the table. I was unable to exchange the 10 [place] biliary tube for a smaller tube or try to dilate the obstruction due to patient's discomfort. Therefore, the catheter was advanced into the biliary system and the tip was placed in the distal common bile duct. Contrast injection confirmed that the tube was well positioned within the biliary system. Bile was aspirated. Sample of the bile was sent for culture and cytology. Catheter was sutured to skin and attached to a gravity bag. Dressing was placed over the tube. FINDINGS: Severe intrahepatic and extrahepatic biliary dilatation. A dilated peripheral bile duct was accessed in the lateral left hepatic lobe. There was complete obstruction in the distal common bile duct. Catheter and wire were advanced across the obstruction but the biliary drain could not be advanced due to patient's discomfort. IMPRESSION: 1. Severe intrahepatic and extrahepatic biliary dilatation due to  obstruction in the distal common bile duct. 2. Successful placement of an external biliary drain. Catheter tip in the distal common bile duct. 3. Will plan to convert the external biliary drain to an internal-external biliary drain in the next few days. If the patient still needs a tissue diagnosis, we may be able to perform brush biopsies at the same time. Electronically Signed   By: Markus Daft M.D.   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 A total of Versed 5.0 mg and 2.5 mg Dilaudid was administered intravenously. Moderate Sedation Time: 37 minutes. The patient's level of consciousness and vital signs were monitored continuously by radiology nursing throughout the procedure under my direct supervision. FLUOROSCOPY TIME:  Fluoroscopy Time: 8 minutes 18 seconds (123.7 mGy). COMPLICATIONS: None PROCEDURE: Informed written consent was obtained from the patient after a thorough discussion of the procedural risks, benefits and alternatives. All questions were addressed. Maximal Sterile Barrier Technique was utilized including caps, mask, sterile gowns, sterile gloves, sterile drape, hand hygiene and skin antiseptic. A timeout was performed prior to the initiation of the procedure. Patient positioned supine position on the fluoroscopy table. Scout images were acquired. The upper abdomen and the indwelling drain were prepped and draped in the usual sterile fashion. 1% lidocaine was used for local anesthesia. The indwelling catheter from a left-sided approach was ligated and a Bentson wire was passed through the catheter into the dilated common hepatic duct. A 9 French short sheath was placed on the Bentson wire into left-sided ductal system. Combination of a Glidewire and a Kumpe the catheter were used to navigate through the malignant obstruction, into the duodenum. Gentle contrast injection confirmed location the duodenum. Rose in wire was placed in the duodenum. Eight French 55 cm introducer from a sheath was advanced over the wire through the  malignant stricture for dilation. They internal external biliary drain was then attempted to pass on the Rose an wire encountering resistance at the malignant stricture. Balloon angioplasty was then performed with a 4 mm x 40 mm balloon through the common bile duct and common hepatic duct. Attempt at placement of the drain was unsuccessful encountering resistance at the malignant stricture. We then selected 6 mm x 40 mm balloon, passed on the Rose an wire through the stricture into the duodenum. We then exchanged the [facility] an wire for an Amplatz wire, placed into the duodenum. Balloon angioplasty of the malignant stricture was then performed with the 6 mm balloon. Balloon was removed and then the 12 French internal/external drain was placed into the duodenum. Contrast injection confirmed location. The drain was sutured in position and attached to gravity drainage. IMPRESSION: Status post exchange of internal biliary drain for a new 12 [place] internal/external biliary drain, after balloon angioplasty of malignant stricture of the common bile duct, 6 mm. Signed, Dulcy Fanny. Dellia Nims, RPVI [facility] Radiology Electronically Signed   By: Corrie Mckusick D.O.   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 A total of Versed 5.0 mg and 2.5 mg Dilaudid was administered intravenously. Moderate Sedation Time: 37 minutes. The patient's level of consciousness and vital signs were monitored continuously by radiology nursing throughout the procedure under my direct  supervision. FLUOROSCOPY TIME:  Fluoroscopy Time: 8 minutes 18 seconds (123.7 mGy). COMPLICATIONS: None PROCEDURE: Informed written consent was obtained from the patient after a thorough discussion of the procedural risks, benefits and alternatives. All questions were addressed. Maximal Sterile Barrier Technique was utilized including caps, mask, sterile gowns, sterile gloves, sterile drape, hand hygiene and skin antiseptic. A timeout was performed prior to the initiation of the procedure. Patient positioned supine position on the fluoroscopy table. Scout images were acquired. The upper abdomen and the indwelling drain were prepped and draped in the usual sterile fashion. 1% lidocaine was used for local anesthesia. The indwelling catheter from a left-sided approach was ligated and a Bentson wire was passed through the catheter into the dilated common hepatic duct. A 9 French short sheath was placed on the Bentson wire into left-sided ductal system. Combination of a Glidewire and a Kumpe the catheter were used to navigate through the malignant obstruction, into the duodenum. Gentle contrast injection confirmed location the duodenum. Rose in wire was placed in the duodenum. Eight French 55 cm introducer from a sheath was advanced over the wire through the malignant stricture for dilation. They internal external biliary drain was then attempted to pass on the Rose an wire encountering resistance at the malignant stricture. Balloon angioplasty was then performed with a 4 mm x 40 mm balloon through the common bile duct and common hepatic duct. Attempt at placement of the drain was unsuccessful encountering resistance at the malignant stricture. We then selected 6 mm x 40 mm balloon, passed on the Rose an wire through the stricture into the duodenum. We then exchanged the [facility] an wire for an Amplatz wire, placed into the duodenum. Balloon angioplasty of the malignant stricture was then performed with the 6 mm balloon.  Balloon was removed and then the 12 French internal/external drain was placed into the duodenum. Contrast injection confirmed location. The drain was sutured in position and attached to gravity drainage. IMPRESSION: Status post exchange of internal biliary drain for a new 12 [place] internal/external biliary drain, after balloon angioplasty of malignant stricture of the common bile duct, 6 mm. Signed, Dulcy Fanny. Dellia Nims, RPVI [facility] Radiology Electronically Signed   By: Corrie Mckusick D.O.   On: 11/08/2019 12:38    Labs:  CBC: Recent Labs    11/03/19 1810 11/04/19 1055 11/05/19 0249 11/07/19 0231  WBC 6.0 5.7 4.8 8.5  HGB 11.0* 11.0* 9.8* 10.3*  HCT 35.1* 35.0* 30.4* 31.5*  PLT 358 345 208 335    COAGS: Recent Labs    11/03/19 1432 11/04/19 1055  INR 1.4* 1.2    BMP: Recent Labs    11/05/19 0249 11/07/19 0231 11/08/19 0409 11/09/19 0309  NA 138 137 139 138  K 3.8 4.7 4.6 5.2*  CL 109 109 113* 110  CO2 20* 21* 20* 19*  GLUCOSE 83 147* 158* 139*  BUN 10 15 19  25*  CALCIUM 9.5 9.6 9.8 9.8  CREATININE 1.08* 1.18* 1.22* 1.24*  GFRNONAA 48* 43* 41* 40*  GFRAA 55* 50* 48* 47*    LIVER FUNCTION TESTS: Recent Labs  11/05/19 0249 11/07/19 0231 11/08/19 0409 11/09/19 0309  BILITOT 5.4* 3.0* 2.4* 2.8*  AST 220* 149* 60* 64*  ALT 90* 86* 65* 52*  ALKPHOS 991* 1,033* 810* 821*  PROT 4.2* 4.7* 4.6* 4.9*  ALBUMIN 1.8* 2.0* 2.0* 2.2*    Assessment and Plan:  History of biliary obstruction s/p external biliary drain placement in IR 11/06/2019 by Dr. Anselm Pancoast; s/p internalization in IR 11/08/2019 by Dr. Earleen Newport. Biliary drain placement stable with approximately 50 cc dark bile with debris in gravity bag (additional 375 cc output in past 24 hours per chart). Tbili 2.8 today- continue to monitor. Continue current drain management- continue with Qshift flushes/monitor of output. Further plans per TRH/GI- appreciate and agree  with management. IR to follow.   Electronically Signed: Earley Abide, PA-C 11/09/2019, 10:37 AM   I spent a total of 25 Minutes at the the patient's bedside AND on the patient's hospital floor or unit, greater than 50% of which was counseling/coordinating care for biliary obstruction s/p internal/external biliary drain placement.

## 2019-11-09 NOTE — Plan of Care (Signed)
  Problem: Education: Goal: Knowledge of General Education information will improve Description: Including pain rating scale, medication(s)/side effects and non-pharmacologic comfort measures Outcome: Progressing   Problem: Clinical Measurements: Goal: Will remain free from infection Outcome: Progressing   

## 2019-11-09 NOTE — Progress Notes (Signed)
Gastroenterology Inpatient Follow-up Note   PATIENT IDENTIFICATION  [name] is a 85 y.o. female with a pmh significant for hypertension, hyperlipidemia, diabetes, sleep apnea, obesity, duodenal stricture status post gastrojejunostomy, new diagnosis of pancreatic adenocarcinoma and now status post PVD (as a result of anatomical inability to perform ERCP for biliary obstruction). Hospital Day: 6  SUBJECTIVE  The patient underwent a PPD internalization yesterday. Patient seems to have potential pancreatitis is having some abdominal pain today. Patient is not having any vomiting but has some mild nausea. LFTs stable from yesterday into today. CA 19-9 has returned at 493. Patient has an appetite still. No fevers or chills.   OBJECTIVE  Scheduled Inpatient Medications:  . dorzolamide  1 drop Both Eyes BID  . enoxaparin (LOVENOX) injection  40 mg Subcutaneous Q24H  . feeding supplement (ENSURE ENLIVE)  237 mL Oral BID BM  . gabapentin  100 mg Oral TID  . indomethacin  100 mg Rectal Once  . levothyroxine  50 mcg Oral Daily  . mirtazapine  15 mg Oral QHS  . multivitamin with minerals  1 tablet Oral Daily  . Netarsudil-Latanoprost  1 drop Both Eyes QHS  . pantoprazole  40 mg Oral Daily  . predniSONE  50 mg Oral Once  . sodium chloride flush  3 mL Intravenous Q12H  . sodium chloride flush  5 mL Intracatheter Q12H   Continuous Inpatient Infusions:  . sodium chloride 75 mL/hr at 11/09/19 1047   PRN Inpatient Medications: albuterol, benzonatate, fluticasone, guaiFENesin, HYDROmorphone (DILAUDID) injection, midazolam, ondansetron **OR** ondansetron (ZOFRAN) IV, simethicone   Physical Examination  Temp:  [97.6 F (36.4 C)-98.6 F (37 C)] 98.6 F (37 C) (02/07 0457) Pulse Rate:  [64-88] 87 (02/07 0457) Resp:  [15-19] 15 (02/07 0457) BP: (112-121)/(62-73) 121/73 (02/07 0457) SpO2:  [94 %-100 %] 94 % (02/07 0457) Temp (24hrs), Avg:98.1 F (36.7 C), Min:97.6 F (36.4 C),  Max:98.6 F (37 C)  Weight: 86.2 kg GEN: Elderly, no acute distress, nontoxic PSYCH: Cooperative EYE: Sclerae anicteric CV: Nontachycardic RESP: No wheezing present GI: NABS, obese, rounded, tenderness to palpation in midepigastrium radiating to back upon the palpation, volitional guarding present, no rebound  MSK/EXT: Lower extremity edema present SKIN: Jaundice NEURO:  Alert & Oriented x 3, no focal deficits   Review of Data   Laboratory Studies   Recent Labs  Lab 11/07/19 0231 11/08/19 0409 11/09/19 0309  NA 137   < > 138  K 4.7   < > 5.2*  CL 109   < > 110  CO2 21*   < > 19*  BUN 15   < > 25*  CREATININE 1.18*   < > 1.24*  GLUCOSE 147*   < > 139*  CALCIUM 9.6   < > 9.8  MG 1.6*  --   --   PHOS 3.5  --   --    < > = values in this interval not displayed.   Recent Labs  Lab 11/09/19 0309  AST 64*  ALT 52*  ALKPHOS 821*    Recent Labs  Lab 11/04/19 1055 11/04/19 1055 11/05/19 0249 11/05/19 0249 11/07/19 0231  WBC 5.7   < > 4.8   < > 8.5  HGB 11.0*   < > 9.8*   < > 10.3*  HCT 35.0*   < > 30.4*   < > 31.5*  PLT 345  --  208  --  335   < > = values in this interval not  displayed.   Recent Labs  Lab 11/03/19 1432 11/04/19 1055  INR 1.4* 1.2    Imaging Studies  IR INT EXT BILIARY DRAIN WITH CHOLANGIOGRAM  Result Date: 11/08/2019 INDICATION: 85 year old female with a history obstructed common bile duct, adenocarcinoma positive on brush biopsy EXAM: EXCHANGE OF BILIARY DRAIN WITH PLACEMENT OF INTERNAL/EXTERNAL BILIARY DRAIN BALLOON ANGIOPLASTY OF MALIGNANT STRICTURE OF THE COMMON BILE DUCT MEDICATIONS: None ANESTHESIA/SEDATION: Moderate (conscious) sedation was employed during this procedure. A total of Versed 5.0 mg and 2.5 mg Dilaudid was administered intravenously. Moderate Sedation Time: 37 minutes. The patient's level of consciousness and vital signs were monitored continuously by radiology nursing throughout the procedure under my direct supervision.  FLUOROSCOPY TIME:  Fluoroscopy Time: 8 minutes 18 seconds (123.7 mGy). COMPLICATIONS: None PROCEDURE: Informed written consent was obtained from the patient after a thorough discussion of the procedural risks, benefits and alternatives. All questions were addressed. Maximal Sterile Barrier Technique was utilized including caps, mask, sterile gowns, sterile gloves, sterile drape, hand hygiene and skin antiseptic. A timeout was performed prior to the initiation of the procedure. Patient positioned supine position on the fluoroscopy table. Scout images were acquired. The upper abdomen and the indwelling drain were prepped and draped in the usual sterile fashion. 1% lidocaine was used for local anesthesia. The indwelling catheter from a left-sided approach was ligated and a Bentson wire was passed through the catheter into the dilated common hepatic duct. A 9 French short sheath was placed on the Bentson wire into left-sided ductal system. Combination of a Glidewire and a Kumpe the catheter were used to navigate through the malignant obstruction, into the duodenum. Gentle contrast injection confirmed location the duodenum. Rose in wire was placed in the duodenum. Eight French 55 cm introducer from a sheath was advanced over the wire through the malignant stricture for dilation. They internal external biliary drain was then attempted to pass on the Rose an wire encountering resistance at the malignant stricture. Balloon angioplasty was then performed with a 4 mm x 40 mm balloon through the common bile duct and common hepatic duct. Attempt at placement of the drain was unsuccessful encountering resistance at the malignant stricture. We then selected 6 mm x 40 mm balloon, passed on the Rose an wire through the stricture into the duodenum. We then exchanged the [facility] an wire for an Amplatz wire, placed into the duodenum. Balloon angioplasty of the malignant stricture was then performed with the 6 mm balloon. Balloon was  removed and then the 12 French internal/external drain was placed into the duodenum. Contrast injection confirmed location. The drain was sutured in position and attached to gravity drainage. IMPRESSION: Status post exchange of internal biliary drain for a new 12 [place] internal/external biliary drain, after balloon angioplasty of malignant stricture of the common bile duct, 6 mm. Signed, Dulcy Fanny. Dellia Nims, RPVI [facility] Radiology Electronically Signed   By: Corrie Mckusick D.O.   On: 11/08/2019 12:38   IR BALLOON DILATION OF BILIARY DUCTS/AMPULLA  Result Date: 11/08/2019 INDICATION: 85 year old female with a history obstructed common bile duct, adenocarcinoma positive on brush biopsy EXAM: EXCHANGE OF BILIARY DRAIN WITH PLACEMENT OF INTERNAL/EXTERNAL BILIARY DRAIN BALLOON ANGIOPLASTY OF MALIGNANT STRICTURE OF THE COMMON BILE DUCT MEDICATIONS: None ANESTHESIA/SEDATION: Moderate (conscious) sedation was employed during this procedure. A total of Versed 5.0 mg and 2.5 mg Dilaudid was administered intravenously. Moderate Sedation Time: 37 minutes. The patient's level of consciousness and vital signs were monitored continuously by radiology nursing  throughout the procedure under my direct supervision. FLUOROSCOPY TIME:  Fluoroscopy Time: 8 minutes 18 seconds (123.7 mGy). COMPLICATIONS: None PROCEDURE: Informed written consent was obtained from the patient after a thorough discussion of the procedural risks, benefits and alternatives. All questions were addressed. Maximal Sterile Barrier Technique was utilized including caps, mask, sterile gowns, sterile gloves, sterile drape, hand hygiene and skin antiseptic. A timeout was performed prior to the initiation of the procedure. Patient positioned supine position on the fluoroscopy table. Scout images were acquired. The upper abdomen and the indwelling drain were prepped and draped in the usual sterile fashion. 1%  lidocaine was used for local anesthesia. The indwelling catheter from a left-sided approach was ligated and a Bentson wire was passed through the catheter into the dilated common hepatic duct. A 9 French short sheath was placed on the Bentson wire into left-sided ductal system. Combination of a Glidewire and a Kumpe the catheter were used to navigate through the malignant obstruction, into the duodenum. Gentle contrast injection confirmed location the duodenum. Rose in wire was placed in the duodenum. Eight French 55 cm introducer from a sheath was advanced over the wire through the malignant stricture for dilation. They internal external biliary drain was then attempted to pass on the Rose an wire encountering resistance at the malignant stricture. Balloon angioplasty was then performed with a 4 mm x 40 mm balloon through the common bile duct and common hepatic duct. Attempt at placement of the drain was unsuccessful encountering resistance at the malignant stricture. We then selected 6 mm x 40 mm balloon, passed on the Rose an wire through the stricture into the duodenum. We then exchanged the [facility] an wire for an Amplatz wire, placed into the duodenum. Balloon angioplasty of the malignant stricture was then performed with the 6 mm balloon. Balloon was removed and then the 12 French internal/external drain was placed into the duodenum. Contrast injection confirmed location. The drain was sutured in position and attached to gravity drainage. IMPRESSION: Status post exchange of internal biliary drain for a new 12 [place] internal/external biliary drain, after balloon angioplasty of malignant stricture of the common bile duct, 6 mm. Signed, Dulcy Fanny. Dellia Nims, RPVI [facility] Radiology Electronically Signed   By: Corrie Mckusick D.O.   On: 11/08/2019 12:38     ASSESSMENT  Ms. Orsi is a 85 y.o. female with a pmh significant for hypertension, hyperlipidemia,  diabetes, sleep apnea, obesity, duodenal stricture status post gastrojejunostomy, new diagnosis of pancreatic adenocarcinoma and now status post PVD (as a result of anatomical inability to perform ERCP for biliary obstruction).  The patient has post procedural pancreatitis from recent internalization of PBD but overall still is looking well. Aggressive IV fluids as well as pain control is important at this time. Patient and husband aware of the diagnosis of pancreatic adenocarcinoma. Oncology consultation reasonable if patient is willing to remain in hospital for a few more days otherwise we will work on expediting this as an outpatient. CA 19-9 elevated all consistent with pancreatic adenocarcinoma. Eventually the internalized PBD may be able to be exchanged to a stented the patient is a noncandidate for surgery. Patient and husband appreciative for all the care that Dr. Silverio Decamp and  GI has provided for her at this point   PLAN/RECOMMENDATIONS  Pain control as per primary medical service IV fluid maintenance has been ordered as LR at 125 an hour and I will time out after 12 hours LFTs while inpatient to  monitor newly internalized PVD If patient remains in hospital for next couple of days would recommend oncology consultation otherwise we will work on placing as an outpatient referral GI will sign off at this time but happy to be available if needed in the near future just please call us back. Patient and husband appreciative for all the care that Dr. Silverio Decamp and [facility] has provided for her at this point   Please page/call with questions or concerns.   Justice Britain, MD [facility] # [phone]    LOS: 5 days  Irving Copas  11/09/2019, 12:52 PM

## 2019-11-10 ENCOUNTER — Telehealth: Payer: Self-pay

## 2019-11-10 ENCOUNTER — Encounter (HOSPITAL_COMMUNITY): Payer: Medicare PPO

## 2019-11-10 ENCOUNTER — Ambulatory Visit (HOSPITAL_COMMUNITY): Admission: RE | Admit: 2019-11-10 | Payer: Medicare PPO | Source: Ambulatory Visit

## 2019-11-10 DIAGNOSIS — C259 Malignant neoplasm of pancreas, unspecified: Secondary | ICD-10-CM

## 2019-11-10 LAB — CBC WITH DIFFERENTIAL/PLATELET
Abs Immature Granulocytes: 0.05 10*3/uL (ref 0.00–0.07)
Basophils Absolute: 0 10*3/uL (ref 0.0–0.1)
Basophils Relative: 0 %
Eosinophils Absolute: 0.1 10*3/uL (ref 0.0–0.5)
Eosinophils Relative: 1 %
HCT: 30.5 % — ABNORMAL LOW (ref 36.0–46.0)
Hemoglobin: 9.9 g/dL — ABNORMAL LOW (ref 12.0–15.0)
Immature Granulocytes: 1 %
Lymphocytes Relative: 10 %
Lymphs Abs: 1 10*3/uL (ref 0.7–4.0)
MCH: 31 pg (ref 26.0–34.0)
MCHC: 32.5 g/dL (ref 30.0–36.0)
MCV: 95.6 fL (ref 80.0–100.0)
Monocytes Absolute: 0.6 10*3/uL (ref 0.1–1.0)
Monocytes Relative: 6 %
Neutro Abs: 8 10*3/uL — ABNORMAL HIGH (ref 1.7–7.7)
Neutrophils Relative %: 82 %
Platelets: 245 10*3/uL (ref 150–400)
RBC: 3.19 MIL/uL — ABNORMAL LOW (ref 3.87–5.11)
RDW: 17.2 % — ABNORMAL HIGH (ref 11.5–15.5)
WBC: 9.8 10*3/uL (ref 4.0–10.5)
nRBC: 0 % (ref 0.0–0.2)

## 2019-11-10 LAB — COMPREHENSIVE METABOLIC PANEL
ALT: 39 U/L (ref 0–44)
AST: 42 U/L — ABNORMAL HIGH (ref 15–41)
Albumin: 1.9 g/dL — ABNORMAL LOW (ref 3.5–5.0)
Alkaline Phosphatase: 548 U/L — ABNORMAL HIGH (ref 38–126)
Anion gap: 6 (ref 5–15)
BUN: 23 mg/dL (ref 8–23)
CO2: 19 mmol/L — ABNORMAL LOW (ref 22–32)
Calcium: 9.5 mg/dL (ref 8.9–10.3)
Chloride: 114 mmol/L — ABNORMAL HIGH (ref 98–111)
Creatinine, Ser: 1.07 mg/dL — ABNORMAL HIGH (ref 0.44–1.00)
GFR calc Af Amer: 56 mL/min — ABNORMAL LOW (ref >60)
GFR calc non Af Amer: 48 mL/min — ABNORMAL LOW (ref >60)
Glucose, Bld: 91 mg/dL (ref 70–99)
Potassium: 4.9 mmol/L (ref 3.5–5.1)
Sodium: 139 mmol/L (ref 135–145)
Total Bilirubin: 2.7 mg/dL — ABNORMAL HIGH (ref 0.3–1.2)
Total Protein: 4.1 g/dL — ABNORMAL LOW (ref 6.5–8.1)

## 2019-11-10 LAB — PHOSPHORUS: Phosphorus: 2.9 mg/dL (ref 2.5–4.6)

## 2019-11-10 LAB — MAGNESIUM: Magnesium: 1.8 mg/dL (ref 1.7–2.4)

## 2019-11-10 LAB — LIPASE, BLOOD: Lipase: 112 U/L — ABNORMAL HIGH (ref 11–51)

## 2019-11-10 MED ORDER — HYDROMORPHONE HCL 2 MG PO TABS
2.0000 mg | ORAL_TABLET | ORAL | Status: DC | PRN
  Administered 2019-11-10: 14:00:00 2 mg via ORAL
  Filled 2019-11-10: qty 1

## 2019-11-10 MED ORDER — LACTATED RINGERS IV SOLN: INTRAVENOUS | Status: AC

## 2019-11-10 NOTE — Plan of Care (Signed)
  Problem: Education: Goal: Knowledge of General Education information will improve Description Including pain rating scale, medication(s)/side effects and non-pharmacologic comfort measures Outcome: Progressing   

## 2019-11-10 NOTE — Progress Notes (Signed)
PROGRESS NOTE    [name]  [id number] DOB: 1935/07/11 DOA: 11/04/2019 PCP: Burnard Bunting, MD    Brief Narrative:  85 year old female with history of hypertension, hyperlipidemia, diet-controlled diabetes, hyperparathyroidism status post parathyroidectomy, obesity and sleep apnea.  Recently treated for pneumonia.  History of gastrojejunostomy for a stricture and J-tube placement who was following outpatient and found to have painless jaundice on routine evaluation.  She was been evaluated for outpatient MRCP, however repeat lab work showed worsening liver enzymes and was brought to the hospital. In the emergency room, hemodynamically stable.  Bilirubin 5.4.  AST/ALT, 274/112.  CT scan abdomen showed severe intrahepatic and common bile duct dilatation. MRI, MRCP showed diffuse biliary and pancreatic ductal dilation and 2 cm lesion in the pancreatic head. ERCP and biopsy consistent with adenocarcinoma of the pancreas.   Assessment & Plan:   Principal Problem:   Painless jaundice Active Problems:   Hypertension   CKD (chronic kidney disease), stage III   Hypothyroid   Idiopathic peripheral neuropathy   Elevated liver enzymes   Pleural effusion, bilateral   Pancreatic mass   Biliary obstruction  Painless jaundice, pancreatic head mass: found to have pancreatic adenocarcinoma. Status post ERCP, EUS and pancreatic mass biopsy by GI 2/4.  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  Pathology consistent with adenocarcinoma. Status post percutaneous biliary drain by interventional radiology 2/4 , changed to external/internal drain 2/6. Developed post procedure pancreatitis with severe abdominal pain and nausea. Continue monitoring LFTs and lipase. Plan is to go home with biliary drain, outpatient oncology follow-up for discussion of further treatment options.  Acute biliary pancreatitis: Patient developed severe abdominal pain and  pancreatitis with lipase of 1100 after procedure.  Treated with IV fluids and IV pain medications.   She had some clinical improvement today, advance to soft diet and patient had recurrent abdominal pain.  Change back to liquid diet, continue maintenance fluid and IV opiates for pain relief.   Patient has multiple allergies to oral opiates, will try oral Dilaudid that she has tolerated by injection.   If able to maintain on oral Dilaudid, that she will be discharged on oral Dilaudid.  Hypertension: Blood pressures are stable.  Chronic kidney disease stage IIIb: Stable.  On maintenance fluid.    Hypothyroidism: On Synthroid.  Continue.   DVT prophylaxis: Lovenox Code Status: Full code Family Communication: Husband at the bedside. Disposition Plan: patient is from home. Anticipated DC to home, Barriers to discharge, developed abdominal pain and could not tolerate advancing diet. Continues to need IV pain medications and fluids.   Consultants:   Gastroenterology  Interventional radiology  Procedures:   ERCP/EUS and biopsy  Percutaneous biliary drain  Internal and external drain  Antimicrobials:  Anti-infectives (From admission, onward)   Start     Dose/Rate Route Frequency Ordered Stop   11/06/19 1700  Ampicillin-Sulbactam (UNASYN) 3 g in sodium chloride 0.9 % 100 mL IVPB     3 g 200 mL/hr over 30 Minutes Intravenous  Once 11/06/19 1542 11/06/19 1707   11/06/19 0830  Ampicillin-Sulbactam (UNASYN) 3 g in sodium chloride 0.9 % 100 mL IVPB     3 g 200 mL/hr over 30 Minutes Intravenous  Once 11/06/19 0754 11/06/19 0840   11/04/19 2200  cefdinir (OMNICEF) capsule 300 mg    Note to Pharmacy: DS 10     300 mg Oral 2 times daily 11/04/19 1651 11/04/19 2138       Subjective:  Patient seen and examined.  Early morning she was comfortable with minimal drain site pain. Later in the afternoon, patient started having more spasmodic pain with no nausea or vomiting.  No bowel movement  yet.  Passing flatus. Husband at the bedside in afternoon.  Objective: Vitals:   11/08/19 2129 11/09/19 0457 11/09/19 1646 11/10/19 0505  BP: 120/62 121/73 (!) 119/58 129/65  Pulse: 88 87 79 75  Resp: 19 15 18 18   Temp: 98.2 F (36.8 C) 98.6 F (37 C) 98.5 F (36.9 C) 98.6 F (37 C)  TempSrc: Oral Oral Oral Oral  SpO2: 96% 94% 100% 97%  Weight:      Height:        Intake/Output Summary (Last 24 hours) at 11/10/2019 1419 Last data filed at 11/10/2019 0930 Gross per 24 hour  Intake 240 ml  Output 1100 ml  Net -860 ml   Filed Weights   11/05/19 0912  Weight: 86.2 kg    Examination:  General exam: Patient in moderate distress with pain. Pulmonary: Bilateral clear CV: Regular rate rhythm.  No added sounds.  No murmurs Extremities: Nontender.  No edema. Gastrointestinal system: Abdomen is nondistended, soft and mild tender along the epigastrium. Biliary tube with turbid bile with sediments.  Data Reviewed: I have personally reviewed following labs and imaging studies  CBC: Recent Labs  Lab 11/03/19 1432 11/03/19 1432 11/03/19 1810 11/04/19 1055 11/05/19 0249 11/07/19 0231 11/10/19 0506  WBC 5.5   < > 6.0 5.7 4.8 8.5 9.8  NEUTROABS 3.7  --   --  3.2  --  7.4 8.0*  HGB 11.1*   < > 11.0* 11.0* 9.8* 10.3* 9.9*  HCT 33.3*   < > 35.1* 35.0* 30.4* 31.5* 30.5*  MCV 91.4   < > 95.4 94.3 92.7 91.0 95.6  PLT 329.0   < > 358 345 208 335 245   < > = values in this interval not displayed.   Basic Metabolic Panel: Recent Labs  Lab 11/05/19 0249 11/07/19 0231 11/08/19 0409 11/09/19 0309 11/10/19 0506  NA 138 137 139 138 139  K 3.8 4.7 4.6 5.2* 4.9  CL 109 109 113* 110 114*  CO2 20* 21* 20* 19* 19*  GLUCOSE 83 147* 158* 139* 91  BUN 10 15 19  25* 23  CREATININE 1.08* 1.18* 1.22* 1.24* 1.07*  CALCIUM 9.5 9.6 9.8 9.8 9.5  MG  --  1.6*  --   --  1.8  PHOS  --  3.5  --   --  2.9   GFR: Estimated Creatinine Clearance: 42.2 mL/min (A) (by C-G formula based on SCr of  1.07 mg/dL (H)). Liver Function Tests: Recent Labs  Lab 11/05/19 0249 11/07/19 0231 11/08/19 0409 11/09/19 0309 11/10/19 0506  AST 220* 149* 60* 64* 42*  ALT 90* 86* 65* 52* 39  ALKPHOS 991* 1,033* 810* 821* 548*  BILITOT 5.4* 3.0* 2.4* 2.8* 2.7*  PROT 4.2* 4.7* 4.6* 4.9* 4.1*  ALBUMIN 1.8* 2.0* 2.0* 2.2* 1.9*   Recent Labs  Lab 11/03/19 1432 11/03/19 1810 11/09/19 0309 11/10/19 0506  LIPASE 504.0* 213* 1,113* 112*   Recent Labs  Lab 11/04/19 1055  AMMONIA 50*   Coagulation Profile: Recent Labs  Lab 11/03/19 1432 11/04/19 1055  INR 1.4* 1.2   Cardiac Enzymes: No results for input(s): CKTOTAL, CKMB, CKMBINDEX, TROPONINI in the last 168 hours. BNP (last 3 results) No results for input(s): PROBNP in the last 8760 hours. HbA1C: No results for input(s): HGBA1C in the last  72 hours. CBG: Recent Labs  Lab 11/05/19 0017 11/05/19 0332 11/05/19 0724 11/05/19 1142 11/06/19 0751  GLUCAP 82 71 70 76 135*   Lipid Profile: No results for input(s): CHOL, HDL, LDLCALC, TRIG, CHOLHDL, LDLDIRECT in the last 72 hours. Thyroid Function Tests: No results for input(s): TSH, T4TOTAL, FREET4, T3FREE, THYROIDAB in the last 72 hours. Anemia Panel: No results for input(s): VITAMINB12, FOLATE, FERRITIN, TIBC, IRON, RETICCTPCT in the last 72 hours. Sepsis Labs: No results for input(s): PROCALCITON, LATICACIDVEN in the last 168 hours.  Recent Results (from the past 240 hour(s))  Respiratory Panel by RT PCR (Flu A&B, Covid) - Nasopharyngeal Swab     Status: None   Collection Time: 11/04/19 10:50 AM   Specimen: Nasopharyngeal Swab  Result Value Ref Range Status   SARS Coronavirus 2 by RT PCR NEGATIVE NEGATIVE Final    Comment: (NOTE) SARS-CoV-2 target nucleic acids are NOT DETECTED. The SARS-CoV-2 RNA is generally detectable in upper respiratoy specimens during the acute phase of infection. The lowest concentration of SARS-CoV-2 viral copies this assay can detect is 131  copies/mL. A negative result does not preclude SARS-Cov-2 infection and should not be used as the sole basis for treatment or other patient management decisions. A negative result may occur with  improper specimen collection/handling, submission of specimen other than nasopharyngeal swab, presence of viral mutation(s) within the areas targeted by this assay, and inadequate number of viral copies (<131 copies/mL). A negative result must be combined with clinical observations, patient history, and epidemiological information. The expected result is Negative. Fact Sheet for Patients:  PinkCheek.be Fact Sheet for Healthcare Providers:  GravelBags.it This test is not yet ap proved or cleared by the [place] FDA and  has been authorized for detection and/or diagnosis of SARS-CoV-2 by FDA under an Emergency Use Authorization (EUA). This EUA will remain  in effect (meaning this test can be used) for the duration of the COVID-19 declaration under Section 564(b)(1) of the Act, 21 U.S.C. section 360bbb-3(b)(1), unless the authorization is terminated or revoked sooner.    Influenza A by PCR NEGATIVE NEGATIVE Final   Influenza B by PCR NEGATIVE NEGATIVE Final    Comment: (NOTE) The Xpert Xpress SARS-CoV-2/FLU/RSV assay is intended as an aid in  the diagnosis of influenza from Nasopharyngeal swab specimens and  should not be used as a sole basis for treatment. Nasal washings and  aspirates are unacceptable for Xpert Xpress SARS-CoV-2/FLU/RSV  testing. Fact Sheet for Patients: PinkCheek.be Fact Sheet for Healthcare Providers: GravelBags.it This test is not yet approved or cleared by the [place] FDA and  has been authorized for detection and/or diagnosis of SARS-CoV-2 by  FDA under an Emergency Use Authorization (EUA). This EUA will remain  in effect (meaning this test can  be used) for the duration of the  Covid-19 declaration under Section 564(b)(1) of the Act, 21  U.S.C. section 360bbb-3(b)(1), unless the authorization is  terminated or revoked. Performed at [facility], [place] [address], [place], [place] [address]   Aerobic/Anaerobic Culture (surgical/deep wound)     Status: None (Preliminary result)   Collection Time: 11/06/19  3:25 PM   Specimen: BILE  Result Value Ref Range Status   Specimen Description BILE  Final   Special Requests NONE  Final   Gram Stain   Final    MODERATE WBC PRESENT, PREDOMINANTLY MONONUCLEAR NO ORGANISMS SEEN    Culture   Final    NO GROWTH 4 DAYS NO ANAEROBES ISOLATED; CULTURE  IN PROGRESS FOR 5 DAYS Performed at [facility], [place] [address], [place], [place] [address]    Report Status PENDING  Incomplete         Radiology Studies: No results found.      Scheduled Meds: . dorzolamide  1 drop Both Eyes BID  . enoxaparin (LOVENOX) injection  40 mg Subcutaneous Q24H  . feeding supplement (ENSURE ENLIVE)  237 mL Oral BID BM  . gabapentin  100 mg Oral TID  . indomethacin  100 mg Rectal Once  . levothyroxine  50 mcg Oral Daily  . mirtazapine  15 mg Oral QHS  . multivitamin with minerals  1 tablet Oral Daily  . Netarsudil-Latanoprost  1 drop Both Eyes QHS  . pantoprazole  40 mg Oral Daily  . predniSONE  50 mg Oral Once  . sodium chloride flush  3 mL Intravenous Q12H  . sodium chloride flush  5 mL Intracatheter Q12H   Continuous Infusions: . lactated ringers       LOS: 6 days    Time spent: 25 minutes    Barb Merino, MD [facility] Pager [phone]

## 2019-11-10 NOTE — Telephone Encounter (Signed)
-----   Message from Irving Copas., MD sent at 11/09/2019 12:58 PM EST ----- Regarding: New diagnosis of pancreatic adenocarcinoma [name] and [name],Patient has a new diagnosis of pancreatic adenocarcinoma.She is now had a PPD placed due to anatomy not allowing ERCP to be performed.She remains in the hospital on Sunday and potentially into Monday with an outpatient referral to oncology expedited will be helpful.  If the patient remains in the hospital a few more days than inpatient medical service may ask for a oncology consultation but I expect this will be done as an outpatient.[name], I am happy to present this patient at an upcoming [place] in the next 2 to 3 weeks.Thanks.GM

## 2019-11-10 NOTE — Progress Notes (Signed)
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  To gravity  Subjective:  FINAL MICROSCOPIC DIAGNOSIS:  A. PANCREAS, HEAD, FINE NEEDLE ASPIRATION:  - Malignant cells consistent with adenocarcinoma   OP is bile ~500 cc daily Flushing well  No pain per pt Minimal Nausea Uncomfortable in bed  TB 2.7   Allergies: Codeine, Contrast media [iodinated diagnostic agents], Iohexol, Aspirin, and Fentanyl  Medications: Prior to Admission medications   Medication Sig Start Date End Date Taking? Authorizing Provider  albuterol (VENTOLIN HFA) 108 (90 Base) MCG/ACT inhaler Inhale 2 puffs into the lungs every 6 (six) hours as needed for cough or shortness of breath. 10/07/19  Yes [provider]  benzonatate (TESSALON) 100 MG capsule Take 100 mg by mouth 3 (three) times daily as needed for cough.  06/18/19  Yes [provider]  cefdinir (OMNICEF) 300 MG capsule Take 300 mg by mouth 2 (two) times daily. DS 10 10/30/19  Yes [provider]  dorzolamide (TRUSOPT) 2 % ophthalmic solution Place 1 drop into both eyes 2 (two) times daily.  11/15/15  Yes [provider]  fluticasone (FLONASE) 50 MCG/ACT nasal spray Place 2 sprays into both nostrils daily. Patient taking differently: Place 2 sprays into both nostrils at bedtime as needed for allergies.  09/01/15  Yes Skeet Latch, MD  furosemide (LASIX) 20 MG tablet Take 20 mg by mouth as needed for fluid. Mon, Wed, Fri 07/27/19  Yes [provider]  gabapentin (NEURONTIN) 100 MG capsule Take 100 mg by mouth 3 (three) times daily. 10/16/19  Yes [provider]  levothyroxine (SYNTHROID) 50 MCG tablet Take 50 mcg by mouth daily. 12/18/18  Yes [provider]  mirtazapine (REMERON) 15 MG tablet Take 15 mg by mouth at bedtime. 07/27/19  Yes [provider]  Multiple Vitamins-Minerals (PRESERVISION AREDS 2+MULTI VIT) CAPS Take 1 capsule by mouth 2 (two) times daily.   Yes [provider]  ondansetron (ZOFRAN) 4 MG tablet Take 1 tablet by mouth every 8 (eight) hours as needed for nausea or vomiting.  05/31/19  Yes [provider]  pantoprazole (PROTONIX) 40 MG tablet Take 40 mg by mouth daily.   Yes [provider]  potassium chloride (KLOR-CON) 10 MEQ tablet Take 10 mEq by mouth. Mon, Wed, Friday, if taking lasix 07/27/19  Yes [provider]  ROCKLATAN 0.02-0.005 % SOLN Place 1 drop into both eyes at bedtime. 01/08/19  Yes [provider]  simethicone (MYLICON) 80 MG chewable tablet Chew 1 tablet (80 mg total) by mouth every 4 (four) hours as needed for flatulence (also available OTC). 05/19/19  Yes Rai, Ripudeep K, MD  losartan (COZAAR) 100 MG tablet TAKE 1 TABLET EACH DAY. Patient taking differently: Take 100 mg by mouth daily.  07/24/16   Skeet Latch, MD  zoledronic acid (RECLAST) 5 MG/100ML SOLN injection Inject 5 mg into the vein yearly. [facility] June 2015)    [provider]     Vital Signs: BP 129/65 (BP Location: Right Wrist)    Pulse 75    Temp 98.6 F (37 C) (Oral)    Resp 18    Ht 5\' 3"  (1.6 m)    Wt 190 lb 0.6 oz (86.2 kg)    SpO2 97%  BMI 33.66 kg/m   Physical Exam Vitals reviewed.  Skin:    General: Skin is warm and dry.     Comments: Site is clean and dry NT no bleeding OP bilious   Psychiatric:     Comments: Husband at bedside     Imaging: IR BILIARY DRAIN PLACEMENT WITH CHOLANGIOGRAM  Result Date: 11/06/2019 INDICATION: 85 year old with biliary obstruction and pancreatic lesion. Biliary system could not be decompressed with ERCP. History of gastrojejunostomy. Patient presents for percutaneous transhepatic cholangiogram and biliary drain  placement. EXAM: PERCUTANEOUS TRANSHEPATIC CHOLANGIOGRAM WITH ULTRASOUND AND FLUOROSCOPIC GUIDANCE PLACEMENT OF EXTERNAL BILIARY DRAIN MEDICATIONS: 125 mg Solu-Medrol. Steroid was given due to contrast allergy. Patient was pre-medicated earlier in the day for the ERCP study. ANESTHESIA/SEDATION: Moderate (conscious) sedation was employed during this procedure. A total of Versed 3.0 mg and Dilaudid 2.0 mg was administered intravenously. Moderate Sedation Time: 64 minutes. The patient's level of consciousness and vital signs were monitored continuously by radiology nursing throughout the procedure under my direct supervision. FLUOROSCOPY TIME:  Fluoroscopy Time: 10 minutes, 30 seconds, 107 mGy CONTRAST:  25 mL Omnipaque [id number] system COMPLICATIONS: None immediate. PROCEDURE: Informed written consent was obtained from the patient after a thorough discussion of the procedural risks, benefits and alternatives. All questions were addressed. Maximal Sterile Barrier Technique was utilized including caps, mask, sterile gowns, sterile gloves, sterile drape, hand hygiene and skin antiseptic. A timeout was performed prior to the initiation of the procedure. The abdomen was prepped and draped in sterile fashion. Ultrasound was used to evaluate the liver. Left lateral hepatic lobe was targeted for percutaneous access. The anterior abdomen was prepped with 1% lidocaine. Using ultrasound guidance, 21 gauge needle was directed into a dilated peripheral bile duct. Contrast injection confirmed placement in the biliary system. 0.018 wire was advanced and a transitional dilator set was placed. Five French catheter and Glidewire were advanced to the distal common bile duct. Additional cholangiograms were performed. Eventually, the Glidewire was able to pass the obstruction in the distal common bile duct. Catheter was advanced into the duodenum with difficulty. Tortuosity of the distal common bile duct made access into the duodenum  difficult. A stiff Glidewire was placed. The tract was dilated and a 10.2 [place] biliary drain was advanced over the wire. The drain easily advanced into the liver but would not advance across the distal biliary obstruction. At this point, the patient was very uncomfortable and moving on the table. I was unable to exchange the 10 [place] biliary tube for a smaller tube or try to dilate the obstruction due to patient's discomfort. Therefore, the catheter was advanced into the biliary system and the tip was placed in the distal common bile duct. Contrast injection confirmed that the tube was well positioned within the biliary system. Bile was aspirated. Sample of the bile was sent for culture and cytology. Catheter was sutured to skin and attached to a gravity bag. Dressing was placed over the tube. FINDINGS: Severe intrahepatic and extrahepatic biliary dilatation. A dilated peripheral bile duct was accessed in the lateral left hepatic lobe. There was complete obstruction in the distal common bile duct. Catheter and wire were advanced across the obstruction but the biliary drain could not be advanced due to patient's discomfort. IMPRESSION: 1. Severe intrahepatic and extrahepatic biliary dilatation due to obstruction in the distal common bile duct. 2. Successful placement of an external biliary drain. Catheter tip in the distal common bile duct. 3. Will plan to convert the external biliary drain to  an internal-external biliary drain in the next few days. If the patient still needs a tissue diagnosis, we may be able to perform brush biopsies at the same time. Electronically Signed   By: Markus Daft M.D.   On: 11/06/2019 17:52   IR INT EXT BILIARY DRAIN WITH CHOLANGIOGRAM  Result Date: 11/08/2019 INDICATION: 85 year old female with a history obstructed common bile duct, adenocarcinoma positive on brush biopsy EXAM: EXCHANGE OF BILIARY DRAIN WITH PLACEMENT OF INTERNAL/EXTERNAL BILIARY DRAIN BALLOON ANGIOPLASTY OF  MALIGNANT STRICTURE OF THE COMMON BILE DUCT MEDICATIONS: None ANESTHESIA/SEDATION: Moderate (conscious) sedation was employed during this procedure. A total of Versed 5.0 mg and 2.5 mg Dilaudid was administered intravenously. Moderate Sedation Time: 37 minutes. The patient's level of consciousness and vital signs were monitored continuously by radiology nursing throughout the procedure under my direct supervision. FLUOROSCOPY TIME:  Fluoroscopy Time: 8 minutes 18 seconds (123.7 mGy). COMPLICATIONS: None PROCEDURE: Informed written consent was obtained from the patient after a thorough discussion of the procedural risks, benefits and alternatives. All questions were addressed. Maximal Sterile Barrier Technique was utilized including caps, mask, sterile gowns, sterile gloves, sterile drape, hand hygiene and skin antiseptic. A timeout was performed prior to the initiation of the procedure. Patient positioned supine position on the fluoroscopy table. Scout images were acquired. The upper abdomen and the indwelling drain were prepped and draped in the usual sterile fashion. 1% lidocaine was used for local anesthesia. The indwelling catheter from a left-sided approach was ligated and a Bentson wire was passed through the catheter into the dilated common hepatic duct. A 9 French short sheath was placed on the Bentson wire into left-sided ductal system. Combination of a Glidewire and a Kumpe the catheter were used to navigate through the malignant obstruction, into the duodenum. Gentle contrast injection confirmed location the duodenum. Rose in wire was placed in the duodenum. Eight French 55 cm introducer from a sheath was advanced over the wire through the malignant stricture for dilation. They internal external biliary drain was then attempted to pass on the Rose an wire encountering resistance at the malignant stricture. Balloon angioplasty was then performed with a 4 mm x 40 mm balloon through the common bile duct and  common hepatic duct. Attempt at placement of the drain was unsuccessful encountering resistance at the malignant stricture. We then selected 6 mm x 40 mm balloon, passed on the Rose an wire through the stricture into the duodenum. We then exchanged the [facility] an wire for an Amplatz wire, placed into the duodenum. Balloon angioplasty of the malignant stricture was then performed with the 6 mm balloon. Balloon was removed and then the 12 French internal/external drain was placed into the duodenum. Contrast injection confirmed location. The drain was sutured in position and attached to gravity drainage. IMPRESSION: Status post exchange of internal biliary drain for a new 12 [place] internal/external biliary drain, after balloon angioplasty of malignant stricture of the common bile duct, 6 mm. Signed, Dulcy Fanny. Dellia Nims, RPVI [facility] Radiology Electronically Signed   By: Corrie Mckusick D.O.   On: 11/08/2019 12:38   IR BALLOON DILATION OF BILIARY DUCTS/AMPULLA  Result Date: 11/08/2019 INDICATION: 85 year old female with a history obstructed common bile duct, adenocarcinoma positive on brush biopsy EXAM: EXCHANGE OF BILIARY DRAIN WITH PLACEMENT OF INTERNAL/EXTERNAL BILIARY DRAIN BALLOON ANGIOPLASTY OF MALIGNANT STRICTURE OF THE COMMON BILE DUCT MEDICATIONS: None ANESTHESIA/SEDATION: Moderate (conscious) sedation was employed during this procedure. A total of Versed 5.0 mg and 2.5  mg Dilaudid was administered intravenously. Moderate Sedation Time: 37 minutes. The patient's level of consciousness and vital signs were monitored continuously by radiology nursing throughout the procedure under my direct supervision. FLUOROSCOPY TIME:  Fluoroscopy Time: 8 minutes 18 seconds (123.7 mGy). COMPLICATIONS: None PROCEDURE: Informed written consent was obtained from the patient after a thorough discussion of the procedural risks, benefits and alternatives. All questions were  addressed. Maximal Sterile Barrier Technique was utilized including caps, mask, sterile gowns, sterile gloves, sterile drape, hand hygiene and skin antiseptic. A timeout was performed prior to the initiation of the procedure. Patient positioned supine position on the fluoroscopy table. Scout images were acquired. The upper abdomen and the indwelling drain were prepped and draped in the usual sterile fashion. 1% lidocaine was used for local anesthesia. The indwelling catheter from a left-sided approach was ligated and a Bentson wire was passed through the catheter into the dilated common hepatic duct. A 9 French short sheath was placed on the Bentson wire into left-sided ductal system. Combination of a Glidewire and a Kumpe the catheter were used to navigate through the malignant obstruction, into the duodenum. Gentle contrast injection confirmed location the duodenum. Rose in wire was placed in the duodenum. Eight French 55 cm introducer from a sheath was advanced over the wire through the malignant stricture for dilation. They internal external biliary drain was then attempted to pass on the Rose an wire encountering resistance at the malignant stricture. Balloon angioplasty was then performed with a 4 mm x 40 mm balloon through the common bile duct and common hepatic duct. Attempt at placement of the drain was unsuccessful encountering resistance at the malignant stricture. We then selected 6 mm x 40 mm balloon, passed on the Rose an wire through the stricture into the duodenum. We then exchanged the [facility] an wire for an Amplatz wire, placed into the duodenum. Balloon angioplasty of the malignant stricture was then performed with the 6 mm balloon. Balloon was removed and then the 12 French internal/external drain was placed into the duodenum. Contrast injection confirmed location. The drain was sutured in position and attached to gravity drainage. IMPRESSION: Status post exchange of internal biliary drain for a new  12 [place] internal/external biliary drain, after balloon angioplasty of malignant stricture of the common bile duct, 6 mm. Signed, Dulcy Fanny. Dellia Nims, RPVI [facility] Radiology Electronically Signed   By: Corrie Mckusick D.O.   On: 11/08/2019 12:38    Labs:  CBC: Recent Labs    11/04/19 1055 11/05/19 0249 11/07/19 0231 11/10/19 0506  WBC 5.7 4.8 8.5 9.8  HGB 11.0* 9.8* 10.3* 9.9*  HCT 35.0* 30.4* 31.5* 30.5*  PLT 345 208 335 245    COAGS: Recent Labs    11/03/19 1432 11/04/19 1055  INR 1.4* 1.2    BMP: Recent Labs    11/07/19 0231 11/08/19 0409 11/09/19 0309 11/10/19 0506  NA 137 139 138 139  K 4.7 4.6 5.2* 4.9  CL 109 113* 110 114*  CO2 21* 20* 19* 19*  GLUCOSE 147* 158* 139* 91  BUN 15 19 25* 23  CALCIUM 9.6 9.8 9.8 9.5  CREATININE 1.18* 1.22* 1.24* 1.07*  GFRNONAA 43* 41* 40* 48*  GFRAA 50* 48* 47* 56*    LIVER FUNCTION TESTS: Recent Labs    11/07/19 0231 11/08/19 0409 11/09/19 0309 11/10/19 0506  BILITOT 3.0* 2.4* 2.8* 2.7*  AST 149* 60* 64* 42*  ALT 86* 65* 52* 39  ALKPHOS 1,033* 810*  821* 548*  PROT 4.7* 4.6* 4.9* 4.1*  ALBUMIN 2.0* 2.0* 2.2* 1.9*    Assessment and Plan:  Panc ca Bili drain intact OP 500 cc daily Capping trial? To discuss with IR Rad  Electronically Signed: Lavonia Drafts, PA-C 11/10/2019, 2:13 PM   I spent a total of 15 Minutes at the the patient's bedside AND on the patient's hospital floor or unit, greater than 50% of which was counseling/coordinating care for biliary drain

## 2019-11-10 NOTE — Progress Notes (Signed)
This RN demonstrated flushing the patient's drain to her husband. Husband is comfortable flushing and emptying her drain.

## 2019-11-10 NOTE — Progress Notes (Signed)
The pt referral has been made to med oncology

## 2019-11-11 ENCOUNTER — Telehealth: Payer: Self-pay | Admitting: Oncology

## 2019-11-11 LAB — COMPREHENSIVE METABOLIC PANEL
ALT: 33 U/L (ref 0–44)
AST: 19 U/L (ref 15–41)
Albumin: 1.8 g/dL — ABNORMAL LOW (ref 3.5–5.0)
Alkaline Phosphatase: 464 U/L — ABNORMAL HIGH (ref 38–126)
Anion gap: 7 (ref 5–15)
BUN: 17 mg/dL (ref 8–23)
CO2: 22 mmol/L (ref 22–32)
Calcium: 9.5 mg/dL (ref 8.9–10.3)
Chloride: 109 mmol/L (ref 98–111)
Creatinine, Ser: 1.02 mg/dL — ABNORMAL HIGH (ref 0.44–1.00)
GFR calc Af Amer: 59 mL/min — ABNORMAL LOW (ref >60)
GFR calc non Af Amer: 51 mL/min — ABNORMAL LOW (ref >60)
Glucose, Bld: 94 mg/dL (ref 70–99)
Potassium: 3.6 mmol/L (ref 3.5–5.1)
Sodium: 138 mmol/L (ref 135–145)
Total Bilirubin: 1.8 mg/dL — ABNORMAL HIGH (ref 0.3–1.2)
Total Protein: 4.2 g/dL — ABNORMAL LOW (ref 6.5–8.1)

## 2019-11-11 LAB — AEROBIC/ANAEROBIC CULTURE W GRAM STAIN (SURGICAL/DEEP WOUND): Culture: NO GROWTH

## 2019-11-11 LAB — SURGICAL PATHOLOGY

## 2019-11-11 LAB — CYTOLOGY - NON PAP

## 2019-11-11 LAB — LIPASE, BLOOD: Lipase: 96 U/L — ABNORMAL HIGH (ref 11–51)

## 2019-11-11 MED ORDER — NORMAL SALINE FLUSH 0.9 % IV SOLN: 5.0000 mL | Freq: Every day | INTRAVENOUS | 0 refills | Status: AC

## 2019-11-11 MED ORDER — HYDROMORPHONE HCL 2 MG PO TABS: 2.0000 mg | ORAL_TABLET | Freq: Four times a day (QID) | ORAL | 0 refills | Status: AC | PRN

## 2019-11-11 NOTE — Progress Notes (Signed)
Discharged patient to home, instructions given and explained with husband at bedside. Concerns addressed and questions answered to patient's satisfaction. Belongings returned accordingly.

## 2019-11-11 NOTE — TOC Transition Note (Addendum)
Transition of Care [facility]) - CM/SW Discharge Note   Patient Details  Name: [name] MRN: [id number] Date of Birth: May 30, 1935  [facility] CM/SW Contact:  Marilu Favre, RN Phone Number: 11/11/2019, 12:03 PM   Clinical Narrative:     Discussed home health with patient and husband. Husband has been taught drain care and is aware HHRN unable to make daily visits   Final next level of care: [place] Barriers to Discharge: No Barriers Identified   Patient Goals and CMS Choice Patient states their goals for this hospitalization and ongoing recovery are:: to return to home CMS Medicare.gov Compare Post Acute Care list provided to:: Patient Choice offered to / list presented to : Spouse, Patient  Discharge Placement                       Discharge Plan and Services     Post Acute Care Choice: Home Health          DME Arranged: N/A         HH Arranged: RN [place] Agency: [place] Date [facility] Agency Contacted: 11/11/19 Time [place]: 1203 Representative spoke with at [place]: [place] Interventions     Readmission Risk Interventions No flowsheet data found.

## 2019-11-11 NOTE — Progress Notes (Signed)
Referring Physician(s): Dr Rush Landmark  Supervising Physician: Earleen Newport  Patient Status:  Oper[facility] - In-pt  Chief Complaint:  Panc cancer 2/6: Procedure: Image guided exchange of biliary drain, with PTA of malignant CBD stricture (23mm & 67mm), and placement of 23F Int/Ext Biliary drain.  To gravity  Subjective:  FINAL MICROSCOPIC DIAGNOSIS:  A. PANCREAS, HEAD, FINE NEEDLE ASPIRATION:  - Malignant cells consistent with adenocarcinoma   OP is bilious Flushing well  No pain per pt Minimal Nausea   TB 1.8   Allergies: Codeine, Contrast media [iodinated diagnostic agents], Iohexol, Aspirin, and Fentanyl  Medications:  Current Facility-Administered Medications:  .  albuterol (PROVENTIL) (2.5 MG/3ML) 0.083% nebulizer solution 2.5 mg, 2.5 mg, Nebulization, Q6H PRN, Smith, Rondell A, MD .  benzonatate (TESSALON) capsule 100 mg, 100 mg, Oral, TID PRN, Fuller Plan A, MD, 100 mg at 11/10/19 0540 .  dorzolamide (TRUSOPT) 2 % ophthalmic solution 1 drop, 1 drop, Both Eyes, BID, Smith, Rondell A, MD, 1 drop at 11/11/19 0934 .  enoxaparin (LOVENOX) injection 40 mg, 40 mg, Subcutaneous, Q24H, Candiss Norse A, PA-C, 40 mg at 11/10/19 1752 .  feeding supplement (ENSURE ENLIVE) (ENSURE ENLIVE) liquid 237 mL, 237 mL, Oral, BID BM, Smith, Rondell A, MD, 237 mL at 11/11/19 0933 .  fluticasone (FLONASE) 50 MCG/ACT nasal spray 2 spray, 2 spray, Each Nare, QHS PRN, Smith, Rondell A, MD .  gabapentin (NEURONTIN) capsule 100 mg, 100 mg, Oral, TID, Tamala Julian, Rondell A, MD, 100 mg at 11/11/19 0934 .  guaiFENesin (ROBITUSSIN) 100 MG/5ML solution 100 mg, 5 mL, Oral, Q4H PRN, Barb Merino, MD .  HYDROmorphone (DILAUDID) injection 0.5 mg, 0.5 mg, Intravenous, Q3H PRN, Barb Merino, MD, 0.5 mg at 11/09/19 2019 .  HYDROmorphone (DILAUDID) tablet 2 mg, 2 mg, Oral, Q4H PRN, Barb Merino, MD, 2 mg at 11/10/19 1428 .  indomethacin (INDOCIN) 50 MG suppository 100 mg, 100 mg, Rectal, Once, Gribbin, Sarah J,  PA-C .  levothyroxine (SYNTHROID) tablet 50 mcg, 50 mcg, Oral, Daily, Fuller Plan A, MD, 50 mcg at 11/11/19 0529 .  midazolam (VERSED) injection, , Intravenous, PRN, Corrie Mckusick, DO, 1 mg at 11/08/19 0954 .  mirtazapine (REMERON) tablet 15 mg, 15 mg, Oral, QHS, Smith, Rondell A, MD, 15 mg at 11/10/19 2124 .  multivitamin with minerals tablet 1 tablet, 1 tablet, Oral, Daily, Barb Merino, MD, 1 tablet at 11/11/19 0934 .  Netarsudil-Latanoprost 0.02-0.005 % SOLN 1 drop, 1 drop, Both Eyes, QHS, Smith, Rondell A, MD, 1 drop at 11/10/19 2126 .  ondansetron (ZOFRAN) tablet 4 mg, 4 mg, Oral, Q6H PRN **OR** ondansetron (ZOFRAN) injection 4 mg, 4 mg, Intravenous, Q6H PRN, Fuller Plan A, MD, 4 mg at 11/09/19 0829 .  pantoprazole (PROTONIX) EC tablet 40 mg, 40 mg, Oral, Daily, Tamala Julian, Rondell A, MD, 40 mg at 11/11/19 0934 .  predniSONE (DELTASONE) tablet 50 mg, 50 mg, Oral, Once, Ghimire, Dante Gang, MD .  simethicone (MYLICON) chewable tablet 80 mg, 80 mg, Oral, Q4H PRN, Fuller Plan A, MD, 80 mg at 11/07/19 1937 .  sodium chloride flush (NS) 0.9 % injection 3 mL, 3 mL, Intravenous, Q12H, Smith, Rondell A, MD, 3 mL at 11/11/19 0934 .  sodium chloride flush (NS) 0.9 % injection 5 mL, 5 mL, Intracatheter, Q12H, Henn, Adam, MD, 5 mL at 11/11/19 0934    Vital Signs: BP (!) 142/62 (BP Location: Right Arm)   Pulse 71   Temp 99 F (37.2 C) (Oral)   Resp 20   Ht 5\' 3"  (  1.6 m)   Wt 86.2 kg   SpO2 96%   BMI 33.66 kg/m   Physical Exam Vitals reviewed.  Skin:    General: Skin is warm and dry.     Comments: Site is clean and dry NT no bleeding OP bilious   Psychiatric:     Comments: Husband at bedside     Imaging: IR INT EXT BILIARY DRAIN WITH CHOLANGIOGRAM  Result Date: 11/08/2019 INDICATION: 85 year old female with a history obstructed common bile duct, adenocarcinoma positive on brush biopsy EXAM: EXCHANGE OF BILIARY DRAIN WITH PLACEMENT OF INTERNAL/EXTERNAL BILIARY DRAIN BALLOON  ANGIOPLASTY OF MALIGNANT STRICTURE OF THE COMMON BILE DUCT MEDICATIONS: None ANESTHESIA/SEDATION: Moderate (conscious) sedation was employed during this procedure. A total of Versed 5.0 mg and 2.5 mg Dilaudid was administered intravenously. Moderate Sedation Time: 37 minutes. The patient's level of consciousness and vital signs were monitored continuously by radiology nursing throughout the procedure under my direct supervision. FLUOROSCOPY TIME:  Fluoroscopy Time: 8 minutes 18 seconds (123.7 mGy). COMPLICATIONS: None PROCEDURE: Informed written consent was obtained from the patient after a thorough discussion of the procedural risks, benefits and alternatives. All questions were addressed. Maximal Sterile Barrier Technique was utilized including caps, mask, sterile gowns, sterile gloves, sterile drape, hand hygiene and skin antiseptic. A timeout was performed prior to the initiation of the procedure. Patient positioned supine position on the fluoroscopy table. Scout images were acquired. The upper abdomen and the indwelling drain were prepped and draped in the usual sterile fashion. 1% lidocaine was used for local anesthesia. The indwelling catheter from a left-sided approach was ligated and a Bentson wire was passed through the catheter into the dilated common hepatic duct. A 9 French short sheath was placed on the Bentson wire into left-sided ductal system. Combination of a Glidewire and a Kumpe the catheter were used to navigate through the malignant obstruction, into the duodenum. Gentle contrast injection confirmed location the duodenum. Rose in wire was placed in the duodenum. Eight French 55 cm introducer from a sheath was advanced over the wire through the malignant stricture for dilation. They internal external biliary drain was then attempted to pass on the Rose an wire encountering resistance at the malignant stricture. Balloon angioplasty was then performed with a 4 mm x 40 mm balloon through the common  bile duct and common hepatic duct. Attempt at placement of the drain was unsuccessful encountering resistance at the malignant stricture. We then selected 6 mm x 40 mm balloon, passed on the Rose an wire through the stricture into the duodenum. We then exchanged the [facility] an wire for an Amplatz wire, placed into the duodenum. Balloon angioplasty of the malignant stricture was then performed with the 6 mm balloon. Balloon was removed and then the 12 French internal/external drain was placed into the duodenum. Contrast injection confirmed location. The drain was sutured in position and attached to gravity drainage. IMPRESSION: Status post exchange of internal biliary drain for a new 12 [place] internal/external biliary drain, after balloon angioplasty of malignant stricture of the common bile duct, 6 mm. Signed, Dulcy Fanny. Dellia Nims, RPVI [facility] Electronically Signed   By: Corrie Mckusick D.O.   On: 11/08/2019 12:38   IR BALLOON DILATION OF BILIARY DUCTS/AMPULLA  Result Date: 11/08/2019 INDICATION: 85 year old female with a history obstructed common bile duct, adenocarcinoma positive on brush biopsy EXAM: EXCHANGE OF BILIARY DRAIN WITH PLACEMENT OF INTERNAL/EXTERNAL BILIARY DRAIN BALLOON ANGIOPLASTY OF MALIGNANT STRICTURE OF THE COMMON BILE DUCT  MEDICATIONS: None ANESTHESIA/SEDATION: Moderate (conscious) sedation was employed during this procedure. A total of Versed 5.0 mg and 2.5 mg Dilaudid was administered intravenously. Moderate Sedation Time: 37 minutes. The patient's level of consciousness and vital signs were monitored continuously by radiology nursing throughout the procedure under my direct supervision. FLUOROSCOPY TIME:  Fluoroscopy Time: 8 minutes 18 seconds (123.7 mGy). COMPLICATIONS: None PROCEDURE: Informed written consent was obtained from the patient after a thorough discussion of the procedural risks, benefits and alternatives. All  questions were addressed. Maximal Sterile Barrier Technique was utilized including caps, mask, sterile gowns, sterile gloves, sterile drape, hand hygiene and skin antiseptic. A timeout was performed prior to the initiation of the procedure. Patient positioned supine position on the fluoroscopy table. Scout images were acquired. The upper abdomen and the indwelling drain were prepped and draped in the usual sterile fashion. 1% lidocaine was used for local anesthesia. The indwelling catheter from a left-sided approach was ligated and a Bentson wire was passed through the catheter into the dilated common hepatic duct. A 9 French short sheath was placed on the Bentson wire into left-sided ductal system. Combination of a Glidewire and a Kumpe the catheter were used to navigate through the malignant obstruction, into the duodenum. Gentle contrast injection confirmed location the duodenum. Rose in wire was placed in the duodenum. Eight French 55 cm introducer from a sheath was advanced over the wire through the malignant stricture for dilation. They internal external biliary drain was then attempted to pass on the Rose an wire encountering resistance at the malignant stricture. Balloon angioplasty was then performed with a 4 mm x 40 mm balloon through the common bile duct and common hepatic duct. Attempt at placement of the drain was unsuccessful encountering resistance at the malignant stricture. We then selected 6 mm x 40 mm balloon, passed on the Rose an wire through the stricture into the duodenum. We then exchanged the [facility] an wire for an Amplatz wire, placed into the duodenum. Balloon angioplasty of the malignant stricture was then performed with the 6 mm balloon. Balloon was removed and then the 12 French internal/external drain was placed into the duodenum. Contrast injection confirmed location. The drain was sutured in position and attached to gravity drainage. IMPRESSION: Status post exchange of internal biliary  drain for a new 12 [place] internal/external biliary drain, after balloon angioplasty of malignant stricture of the common bile duct, 6 mm. Signed, Dulcy Fanny. Dellia Nims, RPVI [facility] Electronically Signed   By: Corrie Mckusick D.O.   On: 11/08/2019 12:38    Labs:  CBC: Recent Labs    11/04/19 1055 11/05/19 0249 11/07/19 0231 11/10/19 0506  WBC 5.7 4.8 8.5 9.8  HGB 11.0* 9.8* 10.3* 9.9*  HCT 35.0* 30.4* 31.5* 30.5*  PLT 345 208 335 245    COAGS: Recent Labs    11/03/19 1432 11/04/19 1055  INR 1.4* 1.2    BMP: Recent Labs    11/08/19 0409 11/09/19 0309 11/10/19 0506 11/11/19 0144  NA 139 138 139 138  K 4.6 5.2* 4.9 3.6  CL 113* 110 114* 109  CO2 20* 19* 19* 22  GLUCOSE 158* 139* 91 94  BUN 19 25* 23 17  CALCIUM 9.8 9.8 9.5 9.5  CREATININE 1.22* 1.24* 1.07* 1.02*  GFRNONAA 41* 40* 48* 51*  GFRAA 48* 47* 56* 59*    LIVER FUNCTION TESTS: Recent Labs    11/08/19 0409 11/09/19 0309 11/10/19 0506 11/11/19 0144  BILITOT 2.4*  2.8* 2.7* 1.8*  AST 60* 64* 42* 19  ALT 65* 52* 39 33  ALKPHOS 810* 821* 548* 464*  PROT 4.6* 4.9* 4.1* 4.2*  ALBUMIN 2.0* 2.2* 1.9* 1.8*    Assessment and Plan: Panc ca S/p I/E bili drain T. Bili down 1.8 D/w Dr. Earleen Newport, pt and husband with good understanding of drain care. Drain is capped. Additional bag given to husband. They understand that they need to reconnect to gravity bag if increased pain, nausea, or drainage from skin site. Plans to follow up with Oncology to formulate treatment plan. IR will set her for 6-8 weeks cholangiogram with drain exchange. They can certainly contact us with any drain related issues.  Electronically Signed: Ascencion Dike, PA-C 11/11/2019, 11:30 AM   I spent a total of 15 Minutes at the the patient's bedside AND on the patient's hospital floor or unit, greater than 50% of which was counseling/coordinating care for biliary drain

## 2019-11-11 NOTE — Discharge Instructions (Signed)
Biliary Drainage Catheter Home Guide A biliary drainage catheter is a thin, flexible tube that is inserted through your skin into the bile ducts in your liver. Bile is a thick yellow or green fluid that helps digest fat in foods. The purpose of a biliary drainage catheter is to keep bile from backing up into your liver. Backup of bile can occur when there is a blockage that prevents bile from moving from the bile ducts into the small intestine as it should. The blockage can be caused by gallstones, a tumor, or scar tissue. There are three types of biliary drainage:  External biliary drainage. With this type, bile is only drained into a collection bag outside your body (external collection bag).  Internal-external biliary drainage. With this type, bile is drained to an external collection bag as well as into your small intestine.  Internal biliary drainage. With this type, bile is only drained into your small intestine. General home care includes these daily actions:   Inspection of your drainage catheter.  Flushing your drainage catheter with saline.  Emptying drainage from the collection bag (if present).  Recording the amount of drainage.  Checking the catheter insertion site for signs of infection. Check for: ? Redness, swelling, or pain. ? Fluid or blood. ? Warmth. ? Pus or a bad smell. How do I inspect my drainage catheter?  Check the dressing to make sure that it is dry and clean.  Look at the skin around the drainage catheter when changing the dressing for any problems such as redness, rash, or skin breakdown.  Check the drainage bag to make sure that drainage fluid is flowing into the bag well. Note the color and amount compared to other days.  Check the drainage catheter and bag for any cracks or kinks in the tubing. How do I change my dressing? The dressing over the drainage catheter should be changed every other day, or more often if needed to keep the dressing dry. Your  health care provider will instruct you about how often to change your dressing. Supplies needed:  Mild soap and warm water.  Split gauze pads, 4 x 4 inches (10 x 10 cm) to use as a dressing sponge.  Gauze pads, 4 x 4 inches (10 x 10 cm) or adhesive dressing cover.  Paper tape. How to change the dressing: 1. Wash your hands with soap and water. 2. Gently remove the old dressing. Avoid using scissors to remove the dressing because they may damage the drainage catheter. 3. Wash the skin around the insertion site with mild soap and warm water, rinse well, then pat the area dry with a clean cloth. 4. Check the skin around the drainage catheter for redness or swelling, or for yellow or green discharge that has a bad smell. 5. If the drainage catheter was stitched (sutured) to the skin, inspect the suture to make sure it is still anchored in the skin. 6. Do not apply creams, ointments, or alcohol to the site. Allow the skin to air-dry completely before you apply a new dressing. 7. Place the drainage catheter through the slit in a dressing sponge. The dressing sponge should slide under the disk that holds the drainage catheter in place. 8. Cover the drainage catheter and the dressing sponge with a 4 x 4 inch (10 x 10 cm) gauze. The drainage catheter should rest on the gauze and not on the skin. 9. Tape the dressing to the skin. 10. You may be instructed to use an  adhesive dressing covering over the top of this in place of the gauze and tape. 11. Wash your hands with soap and water. How do I flush my drainage catheter? Biliary drainagecatheters should be flushed daily, or as often as told by your health care provider. The end of the drainage catheter is closed using an IV cap. A syringe can be directly connected to the IV cap. Supplies needed:  Alcohol swab.  10 mL prefilled normal saline syringe. How to flush the drainage catheter: 1. Wash your hands with soap and water. 2. If your drainage  catheter has a stopcock attached to it, turn the stopcock toward the drainage bag. This will allow the saline to flow in the direction of your body. 3. Clean the IV cap with an alcohol swab. 4. Screw the tip of a 10 mL normal saline syringe onto the IV cap. 5. Inject the saline over 5-10 seconds. If you feel resistance while injecting, stop immediately. Avoid  pulling back on the plunger. Doing that could increase your risk of infection. 6. Remove the syringe from the cap. Turn the stopcock so that fluid flows from your body into the drainage bag. You may notice more fluid flowing into the bag after you have completed the flush. How do I attach a bag to my drainage catheter? If you are having trouble with your internal biliary drain, you may be directed by your health care provider to use bag drainage until you can be seen to fix the problem. For this reason, you should always have a collection bag and connecting tubing at home. If you do not have these supplies, remember to ask for them at your next appointment. 1. Remove the bag and the connecting tubing from their packaging. 2. Connect the funnel end of the tubing to the bag's cone-shaped stem. 3. Remove the IV cap from the biliary drain. To do this, unscrew it and replace it with the screw-on end of the tubing. 4. Save the IV cap in a plastic storage bag that can be sealed. How do I empty my collection bag? Empty the collection bag whenever it becomes 2/3 full. Also empty it before you go to sleep. Most collection bags have a drainage valve at the bottom so the bag can be that allows them to be emptied easily. 1. Wash your hands with soap and water. 2. Hold the collection bag over the toilet, basin, or collection container. Use a measuring container if your health care provider told you to measure the drainage. 3. Unscrew the valve to open it, and allow the bag to drain. 4. Close the valve securely to avoid leakage. 5. Use a tissue or disposable  napkin to wipe the valve clean. 6. Wash the measuring container with soap and water. 7. Record the amount of drainage as told by your health care provider. Contact a health care provider if:  Your pain gets worse after it had improved, and it is not relieved with pain medicines.  You have any questions about caring for your drainage catheter or collection bag.  You have any of these around your catheter insertion site or coming from it: ? Skin breakdown. ? Redness, swelling, or pain. ? Fluid or blood. ? Warmth to the touch. ? Pus or a bad smell. Get help right away if:  You have a fever or chills.  Your redness, swelling, or pain at the catheter insertion site gets worse, even though you are cleaning it well.  You have leakage of  bile around the drainage catheter.  Your drainage catheter becomes blocked or clogged.  Your drainage catheter comes out. This information is not intended to replace advice given to you by your health care provider. Make sure you discuss any questions you have with your health care provider. Document Revised: 08/31/2017 Document Reviewed: 08/07/2016 [employer] Patient Education  2020 [employer]  

## 2019-11-11 NOTE — Discharge Summary (Signed)
Physician Discharge Summary  [name] [id number] DOB: 1935-03-17 DOA: 11/04/2019  PCP: Burnard Bunting, MD  Admit date: 11/04/2019 Discharge date: 11/11/2019  Admitted From: Home Disposition: Home  Recommendations for Outpatient Follow-up:  1. Follow up with PCP in 1-2 weeks 2. Outpatient follow-up with oncology and gastroenterology as scheduled.  Home Health: RN Equipment/Devices: None  Discharge Condition: Fair CODE STATUS: Full code Diet recommendation: Still full liquid diet until abdominal pain is improved then go up to soft diet.  Discharge summary: 85 year old female with history of hypertension, hyperlipidemia, diet-controlled diabetes, hyperparathyroidism status post parathyroidectomy, obesity and sleep apnea.  Recently treated for pneumonia.  History of gastrojejunostomy for a stricture and J-tube placement who was following outpatient and found to have painless jaundice on routine evaluation.  She was been evaluated for outpatient MRCP, however repeat lab work showed worsening liver enzymes and was brought to the hospital. In the emergency room, hemodynamically stable.  Bilirubin 5.4.  AST/ALT, 274/112.  CT scan abdomen showed severe intrahepatic and common bile duct dilatation. MRI, MRCP showed diffuse biliary and pancreatic ductal dilation and 2 cm lesion in the pancreatic head. ERCP and biopsy consistent with adenocarcinoma of the pancreas.  # Painless jaundice, pancreatic head mass that is biopsy-proven pancreatic adenocarcinoma. Status post ERCP, EUS and pancreatic mass biopsy by GI 2/4.  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  Pathology consistent with adenocarcinoma. Status post percutaneous biliary drain by interventional radiology 2/4 , changed to external/internal drain 2/6. Developed post procedure pancreatitis with severe abdominal pain and nausea.  That has improved now. Lipase normalized.  LFTs trending  down. External biliary drain capped today, going home with instructions to catheter care, flushing and outpatient follow-up. Patient has cancer related pain and procedure related pain, oral hydromorphone was prescribed. Since her symptoms are well controlled today, she is going home to recover, there is outpatient oncology follow-up to discuss further about her treatment of pancreatic cancer.  Renal functions remained stable.  Blood pressures are stable, she will not need multiple blood pressure medications.  TSH is normal.  On Synthroid.  Discharge Diagnoses:  Principal Problem:   Painless jaundice Active Problems:   Hypertension   CKD (chronic kidney disease), stage III   Hypothyroid   Idiopathic peripheral neuropathy   Elevated liver enzymes   Pleural effusion, bilateral   Pancreatic mass   Biliary obstruction    Discharge Instructions  Discharge Instructions    Call MD for:  persistant nausea and vomiting   Complete by: As directed    Call MD for:  severe uncontrolled pain   Complete by: As directed    Call MD for:  temperature >100.4   Complete by: As directed    Diet full liquid   Complete by: As directed    Discharge instructions   Complete by: As directed    Flush biliary catheter once daily with sterile water or normal saline as instructed.   Increase activity slowly   Complete by: As directed    No dressing needed   Complete by: As directed      Allergies as of 11/11/2019      Reactions   Codeine Nausea And Vomiting   Contrast Media [iodinated Diagnostic Agents] Hives, Shortness Of Breath, Itching   Iohexol Hives, Other (See Comments)   REACTION: " TROUBLE BREATHING"   Aspirin    In high doses bleeding   Fentanyl    Altered mental state  Medication List    STOP taking these medications   cefdinir 300 MG capsule Commonly known as: OMNICEF   losartan 100 MG tablet Commonly known as: COZAAR   potassium chloride 10 MEQ tablet Commonly known as:  KLOR-CON     TAKE these medications   albuterol 108 (90 Base) MCG/ACT inhaler Commonly known as: VENTOLIN HFA Inhale 2 puffs into the lungs every 6 (six) hours as needed for cough or shortness of breath.   benzonatate 100 MG capsule Commonly known as: TESSALON Take 100 mg by mouth 3 (three) times daily as needed for cough.   dorzolamide 2 % ophthalmic solution Commonly known as: TRUSOPT Place 1 drop into both eyes 2 (two) times daily.   fluticasone 50 MCG/ACT nasal spray Commonly known as: Flonase Place 2 sprays into both nostrils daily. What changed:   when to take this  reasons to take this   furosemide 20 MG tablet Commonly known as: LASIX Take 20 mg by mouth as needed for fluid. Mon, Wed, Fri   gabapentin 100 MG capsule Commonly known as: NEURONTIN Take 100 mg by mouth 3 (three) times daily.   HYDROmorphone 2 MG tablet Commonly known as: DILAUDID Take 1 tablet (2 mg total) by mouth every 6 (six) hours as needed for up to 8 days for moderate pain.   levothyroxine 50 MCG tablet Commonly known as: SYNTHROID Take 50 mcg by mouth daily.   mirtazapine 15 MG tablet Commonly known as: REMERON Take 15 mg by mouth at bedtime.   Normal Saline Flush 0.9 % Soln Inject 5 mLs into the vein daily for 30 doses. 5 ml into the catheter every day   ondansetron 4 MG tablet Commonly known as: ZOFRAN Take 1 tablet by mouth every 8 (eight) hours as needed for nausea or vomiting.   pantoprazole 40 MG tablet Commonly known as: PROTONIX Take 40 mg by mouth daily.   PreserVision AREDS 2+Multi Vit Caps Take 1 capsule by mouth 2 (two) times daily.   Reclast 5 MG/100ML Soln injection Generic drug: zoledronic acid Inject 5 mg into the vein yearly. (Start June 2015)   Rocklatan 0.02-0.005 % Soln Generic drug: Netarsudil-Latanoprost Place 1 drop into both eyes at bedtime.   simethicone 80 MG chewable tablet Commonly known as: MYLICON Chew 1 tablet (80 mg total) by mouth every 4  (four) hours as needed for flatulence (also available OTC).       Allergies  Allergen Reactions  . Codeine Nausea And Vomiting  . Contrast Media [Iodinated Diagnostic Agents] Hives, Shortness Of Breath and Itching  . Iohexol Hives and Other (See Comments)    REACTION: " TROUBLE BREATHING"  . Aspirin     In high doses bleeding  . Fentanyl     Altered mental state    Consultations:  Gastroenterology  Interventional radiology   Procedures/Studies: CT ABDOMEN PELVIS WO CONTRAST  Result Date: 11/03/2019 CLINICAL DATA:  Abnormal liver labs. EXAM: CT ABDOMEN AND PELVIS WITHOUT CONTRAST TECHNIQUE: Multidetector CT imaging of the abdomen and pelvis was performed following the standard protocol without IV contrast. COMPARISON:  May 01, 2019 FINDINGS: Lower chest: Mild scarring and/or atelectasis is seen within the anterior and posterior aspect of the left lung base. There are small bilateral pleural effusions, left greater than right. Hepatobiliary: No focal liver abnormality is seen. Status post cholecystectomy. There is marked severity intra hepatic biliary dilatation with dilatation of the common bile duct. Pancreas: Unremarkable. No pancreatic ductal dilatation or surrounding inflammatory changes. Spleen: Normal  in size without focal abnormality. Adrenals/Urinary Tract: Adrenal glands are unremarkable. Kidneys are normal in size. Multiple stable bilateral renal cysts are seen. The largest measures approximately 3.7 cm in diameter and is located within the posterior aspect of the mid to lower right kidney. 1.9 cm and 0.5 cm nonobstructing renal stones are seen within the right kidney. 0.3 cm and 0.6 cm nonobstructing renal stones are seen within the left kidney. Bladder is unremarkable. Stomach/Bowel: Stomach is within normal limits. The appendix is not identified. Surgically anastomosed bowel is seen within the anterior aspect of the mid to upper abdomen. No evidence of bowel dilatation.  Noninflamed diverticula are seen throughout the sigmoid colon. Vascular/Lymphatic: Mild aortic atherosclerosis. No enlarged abdominal or pelvic lymph nodes. Reproductive: A stable 2.0 cm x 1.5 cm coarse calcification is seen within the posterior aspect of the uterus. The bilateral adnexa are unremarkable. Other: No abdominal wall hernia or abnormality. No abdominopelvic ascites. Musculoskeletal: Bilateral metallic density pedicle screws are seen at the levels of L2, L3 and L4 vertebral bodies. Moderate to marked severity degenerative changes are noted throughout the remainder of the lumbar spine. IMPRESSION: 1. Marked severity intra hepatic biliary dilatation with dilatation of the common bile duct. This represents a new finding when compared to the prior study dated May 01, 2019 and may be related to the patient's post cholecystectomy state, or possibly a distal common bile duct stricture. 2. Bilateral nonobstructing renal stones. 3. Sigmoid diverticulosis without evidence of diverticulitis. 4. Calcified uterine fibroid. 5. Small bilateral pleural effusions, left greater than right. Electronically Signed   By: Virgina Norfolk M.D.   On: 11/03/2019 23:06   MR 3D Recon At Scanner  Result Date: 11/04/2019 CLINICAL DATA:  Painless jaundice. Weight loss. Biliary ductal dilatation is on recent CT. Prior cholecystectomy. EXAM: MRI ABDOMEN WITHOUT AND WITH CONTRAST (INCLUDING MRCP) TECHNIQUE: Multiplanar multisequence MR imaging of the abdomen was performed both before and after the administration of intravenous contrast. Heavily T2-weighted images of the biliary and pancreatic ducts were obtained, and three-dimensional MRCP images were rendered by post processing. CONTRAST:  15mL GADAVIST GADOBUTROL 1 MMOL/ML IV SOLN COMPARISON:  CT on 11/03/2019 and 05/01/2019 FINDINGS: Lower chest: Small bilateral pleural effusions again seen. Hepatobiliary: No hepatic masses identified. Prior cholecystectomy. Severe diffuse  biliary ductal dilatation is again seen with common bile duct measuring 31 mm diameter. Stricture of the distal common bile duct is seen in the pancreatic head. No evidence of choledocholithiasis. Pancreas: Mild diffuse pancreatic ductal dilatation is seen. An ill-defined enhancing lesion with central cystic foci is seen in the pancreatic head, which measures 2.1 x 1.8 cm on image 97/series 1201. This also causes obstruction of the distal common bile duct. This is highly suspicious for pancreatic carcinoma. Spleen:  Within normal limits in size and appearance. Adrenals/Urinary Tract: Bilateral renal cysts are noted. No masses identified. No evidence of hydronephrosis. Stomach/Bowel: Visualized portion unremarkable. Vascular/Lymphatic: No pathologically enlarged lymph nodes identified. No abdominal aortic aneurysm. Other:  None. Musculoskeletal:  No suspicious bone lesions identified. IMPRESSION: 1. Diffuse biliary and pancreatic ductal dilatation due to 2 cm lesion in the pancreatic head. This is highly suspicious for pancreatic carcinoma. Consider endoscopic ultrasound for further evaluation. 2. No evidence of abdominal metastatic disease. 3. Small bilateral pleural effusions. Electronically Signed   By: Marlaine Hind M.D.   On: 11/04/2019 18:41   DG C-Arm 1-60 Min-No Report  Result Date: 11/06/2019 Fluoroscopy was utilized by the requesting physician.  No radiographic interpretation.   MR  ABDOMEN MRCP W WO CONTAST  Result Date: 11/04/2019 CLINICAL DATA:  Painless jaundice. Weight loss. Biliary ductal dilatation is on recent CT. Prior cholecystectomy. EXAM: MRI ABDOMEN WITHOUT AND WITH CONTRAST (INCLUDING MRCP) TECHNIQUE: Multiplanar multisequence MR imaging of the abdomen was performed both before and after the administration of intravenous contrast. Heavily T2-weighted images of the biliary and pancreatic ducts were obtained, and three-dimensional MRCP images were rendered by post processing. CONTRAST:  71mL  GADAVIST GADOBUTROL 1 MMOL/ML IV SOLN COMPARISON:  CT on 11/03/2019 and 05/01/2019 FINDINGS: Lower chest: Small bilateral pleural effusions again seen. Hepatobiliary: No hepatic masses identified. Prior cholecystectomy. Severe diffuse biliary ductal dilatation is again seen with common bile duct measuring 31 mm diameter. Stricture of the distal common bile duct is seen in the pancreatic head. No evidence of choledocholithiasis. Pancreas: Mild diffuse pancreatic ductal dilatation is seen. An ill-defined enhancing lesion with central cystic foci is seen in the pancreatic head, which measures 2.1 x 1.8 cm on image 97/series 1201. This also causes obstruction of the distal common bile duct. This is highly suspicious for pancreatic carcinoma. Spleen:  Within normal limits in size and appearance. Adrenals/Urinary Tract: Bilateral renal cysts are noted. No masses identified. No evidence of hydronephrosis. Stomach/Bowel: Visualized portion unremarkable. Vascular/Lymphatic: No pathologically enlarged lymph nodes identified. No abdominal aortic aneurysm. Other:  None. Musculoskeletal:  No suspicious bone lesions identified. IMPRESSION: 1. Diffuse biliary and pancreatic ductal dilatation due to 2 cm lesion in the pancreatic head. This is highly suspicious for pancreatic carcinoma. Consider endoscopic ultrasound for further evaluation. 2. No evidence of abdominal metastatic disease. 3. Small bilateral pleural effusions. Electronically Signed   By: Marlaine Hind M.D.   On: 11/04/2019 18:41   IR BILIARY DRAIN PLACEMENT WITH CHOLANGIOGRAM  Result Date: 11/06/2019 INDICATION: 85 year old with biliary obstruction and pancreatic lesion. Biliary system could not be decompressed with ERCP. History of gastrojejunostomy. Patient presents for percutaneous transhepatic cholangiogram and biliary drain placement. EXAM: PERCUTANEOUS TRANSHEPATIC CHOLANGIOGRAM WITH ULTRASOUND AND FLUOROSCOPIC GUIDANCE PLACEMENT OF EXTERNAL BILIARY DRAIN  MEDICATIONS: 125 mg Solu-Medrol. Steroid was given due to contrast allergy. Patient was pre-medicated earlier in the day for the ERCP study. ANESTHESIA/SEDATION: Moderate (conscious) sedation was employed during this procedure. A total of Versed 3.0 mg and Dilaudid 2.0 mg was administered intravenously. Moderate Sedation Time: 64 minutes. The patient's level of consciousness and vital signs were monitored continuously by radiology nursing throughout the procedure under my direct supervision. FLUOROSCOPY TIME:  Fluoroscopy Time: 10 minutes, 30 seconds, 107 mGy CONTRAST:  25 mL Omnipaque [id number] system COMPLICATIONS: None immediate. PROCEDURE: Informed written consent was obtained from the patient after a thorough discussion of the procedural risks, benefits and alternatives. All questions were addressed. Maximal Sterile Barrier Technique was utilized including caps, mask, sterile gowns, sterile gloves, sterile drape, hand hygiene and skin antiseptic. A timeout was performed prior to the initiation of the procedure. The abdomen was prepped and draped in sterile fashion. Ultrasound was used to evaluate the liver. Left lateral hepatic lobe was targeted for percutaneous access. The anterior abdomen was prepped with 1% lidocaine. Using ultrasound guidance, 21 gauge needle was directed into a dilated peripheral bile duct. Contrast injection confirmed placement in the biliary system. 0.018 wire was advanced and a transitional dilator set was placed. Five French catheter and Glidewire were advanced to the distal common bile duct. Additional cholangiograms were performed. Eventually, the Glidewire was able to pass the obstruction in the distal common bile duct. Catheter was advanced into the duodenum with difficulty.  Tortuosity of the distal common bile duct made access into the duodenum difficult. A stiff Glidewire was placed. The tract was dilated and a 10.2 [place] biliary drain was advanced over the wire. The drain  easily advanced into the liver but would not advance across the distal biliary obstruction. At this point, the patient was very uncomfortable and moving on the table. I was unable to exchange the 10 [place] biliary tube for a smaller tube or try to dilate the obstruction due to patient's discomfort. Therefore, the catheter was advanced into the biliary system and the tip was placed in the distal common bile duct. Contrast injection confirmed that the tube was well positioned within the biliary system. Bile was aspirated. Sample of the bile was sent for culture and cytology. Catheter was sutured to skin and attached to a gravity bag. Dressing was placed over the tube. FINDINGS: Severe intrahepatic and extrahepatic biliary dilatation. A dilated peripheral bile duct was accessed in the lateral left hepatic lobe. There was complete obstruction in the distal common bile duct. Catheter and wire were advanced across the obstruction but the biliary drain could not be advanced due to patient's discomfort. IMPRESSION: 1. Severe intrahepatic and extrahepatic biliary dilatation due to obstruction in the distal common bile duct. 2. Successful placement of an external biliary drain. Catheter tip in the distal common bile duct. 3. Will plan to convert the external biliary drain to an internal-external biliary drain in the next few days. If the patient still needs a tissue diagnosis, we may be able to perform brush biopsies at the same time. Electronically Signed   By: Markus Daft M.D.   On: 11/06/2019 17:52   IR INT EXT BILIARY DRAIN WITH CHOLANGIOGRAM  Result Date: 11/08/2019 INDICATION: 85 year old female with a history obstructed common bile duct, adenocarcinoma positive on brush biopsy EXAM: EXCHANGE OF BILIARY DRAIN WITH PLACEMENT OF INTERNAL/EXTERNAL BILIARY DRAIN BALLOON ANGIOPLASTY OF MALIGNANT STRICTURE OF THE COMMON BILE DUCT MEDICATIONS: None ANESTHESIA/SEDATION: Moderate (conscious) sedation was employed during this  procedure. A total of Versed 5.0 mg and 2.5 mg Dilaudid was administered intravenously. Moderate Sedation Time: 37 minutes. The patient's level of consciousness and vital signs were monitored continuously by radiology nursing throughout the procedure under my direct supervision. FLUOROSCOPY TIME:  Fluoroscopy Time: 8 minutes 18 seconds (123.7 mGy). COMPLICATIONS: None PROCEDURE: Informed written consent was obtained from the patient after a thorough discussion of the procedural risks, benefits and alternatives. All questions were addressed. Maximal Sterile Barrier Technique was utilized including caps, mask, sterile gowns, sterile gloves, sterile drape, hand hygiene and skin antiseptic. A timeout was performed prior to the initiation of the procedure. Patient positioned supine position on the fluoroscopy table. Scout images were acquired. The upper abdomen and the indwelling drain were prepped and draped in the usual sterile fashion. 1% lidocaine was used for local anesthesia. The indwelling catheter from a left-sided approach was ligated and a Bentson wire was passed through the catheter into the dilated common hepatic duct. A 9 French short sheath was placed on the Bentson wire into left-sided ductal system. Combination of a Glidewire and a Kumpe the catheter were used to navigate through the malignant obstruction, into the duodenum. Gentle contrast injection confirmed location the duodenum. Rose in wire was placed in the duodenum. Eight French 55 cm introducer from a sheath was advanced over the wire through the malignant stricture for dilation. They internal external biliary drain was then attempted to pass on the Rose an wire encountering resistance at  the malignant stricture. Balloon angioplasty was then performed with a 4 mm x 40 mm balloon through the common bile duct and common hepatic duct. Attempt at placement of the drain was unsuccessful encountering resistance at the malignant stricture. We then  selected 6 mm x 40 mm balloon, passed on the Rose an wire through the stricture into the duodenum. We then exchanged the [facility] an wire for an Amplatz wire, placed into the duodenum. Balloon angioplasty of the malignant stricture was then performed with the 6 mm balloon. Balloon was removed and then the 12 French internal/external drain was placed into the duodenum. Contrast injection confirmed location. The drain was sutured in position and attached to gravity drainage. IMPRESSION: Status post exchange of internal biliary drain for a new 12 [place] internal/external biliary drain, after balloon angioplasty of malignant stricture of the common bile duct, 6 mm. Signed, Dulcy Fanny. Dellia Nims, RPVI [facility] Radiology Electronically Signed   By: Corrie Mckusick D.O.   On: 11/08/2019 12:38   IR BALLOON DILATION OF BILIARY DUCTS/AMPULLA  Result Date: 11/08/2019 INDICATION: 85 year old female with a history obstructed common bile duct, adenocarcinoma positive on brush biopsy EXAM: EXCHANGE OF BILIARY DRAIN WITH PLACEMENT OF INTERNAL/EXTERNAL BILIARY DRAIN BALLOON ANGIOPLASTY OF MALIGNANT STRICTURE OF THE COMMON BILE DUCT MEDICATIONS: None ANESTHESIA/SEDATION: Moderate (conscious) sedation was employed during this procedure. A total of Versed 5.0 mg and 2.5 mg Dilaudid was administered intravenously. Moderate Sedation Time: 37 minutes. The patient's level of consciousness and vital signs were monitored continuously by radiology nursing throughout the procedure under my direct supervision. FLUOROSCOPY TIME:  Fluoroscopy Time: 8 minutes 18 seconds (123.7 mGy). COMPLICATIONS: None PROCEDURE: Informed written consent was obtained from the patient after a thorough discussion of the procedural risks, benefits and alternatives. All questions were addressed. Maximal Sterile Barrier Technique was utilized including caps, mask, sterile gowns, sterile gloves, sterile drape, hand hygiene  and skin antiseptic. A timeout was performed prior to the initiation of the procedure. Patient positioned supine position on the fluoroscopy table. Scout images were acquired. The upper abdomen and the indwelling drain were prepped and draped in the usual sterile fashion. 1% lidocaine was used for local anesthesia. The indwelling catheter from a left-sided approach was ligated and a Bentson wire was passed through the catheter into the dilated common hepatic duct. A 9 French short sheath was placed on the Bentson wire into left-sided ductal system. Combination of a Glidewire and a Kumpe the catheter were used to navigate through the malignant obstruction, into the duodenum. Gentle contrast injection confirmed location the duodenum. Rose in wire was placed in the duodenum. Eight French 55 cm introducer from a sheath was advanced over the wire through the malignant stricture for dilation. They internal external biliary drain was then attempted to pass on the Rose an wire encountering resistance at the malignant stricture. Balloon angioplasty was then performed with a 4 mm x 40 mm balloon through the common bile duct and common hepatic duct. Attempt at placement of the drain was unsuccessful encountering resistance at the malignant stricture. We then selected 6 mm x 40 mm balloon, passed on the Rose an wire through the stricture into the duodenum. We then exchanged the [facility] an wire for an Amplatz wire, placed into the duodenum. Balloon angioplasty of the malignant stricture was then performed with the 6 mm balloon. Balloon was removed and then the 12 French internal/external drain was placed into the duodenum. Contrast injection confirmed location. The drain was  sutured in position and attached to gravity drainage. IMPRESSION: Status post exchange of internal biliary drain for a new 12 [place] internal/external biliary drain, after balloon angioplasty of malignant stricture of the common bile duct, 6 mm. Signed, Dulcy Fanny. Dellia Nims, RPVI [facility] Radiology Electronically Signed   By: Corrie Mckusick D.O.   On: 11/08/2019 12:38   US Abdomen Limited RUQ  Result Date: 10/30/2019 CLINICAL DATA:  Elevated liver function test. History of a cholecystectomy. EXAM: ULTRASOUND ABDOMEN LIMITED RIGHT UPPER QUADRANT COMPARISON:  CT, 05/01/2019 FINDINGS: Gallbladder: Surgically absent Common bile duct: Diameter: Dilated to 2.7 cm tapering to 12 mm near the pancreatic head. This is a significant increase in size compared to the prior CT. No visualized duct stone. Liver: Mild increased echogenicity. Intrahepatic bile duct dilation, not present on the prior CT. Liver normal in overall size. No mass or focal lesion. Portal vein is patent on color Doppler imaging with normal direction of blood flow towards the liver. Other: Pancreatic duct dilated to 3-4 mm. Incidental note of an anterior cyst from the right kidney near the gallbladder fossa. IMPRESSION: 1. Significant intra and extrahepatic bile duct dilation, which is new compared to the prior abdomen and pelvis CT. Pancreatic head not well visualized, nor is the distal common bile duct. Pancreatic duct is dilated. Findings are concerning for an obstructing pancreatic head mass versus a distal common bile duct stone. Further assessment with MRCP or ERCP is recommended. 2. Probable mild hepatic steatosis. Electronically Signed   By: Lajean Manes M.D.   On: 10/30/2019 10:51     Subjective: Patient was seen and examined.  She was able to eat full liquid diet, Ensure with no recurrence of abdominal pain.  No bowel movement yet.  Passing flatus. Husband at the bedside, patient on chair, Biliary Tube Was capped and instructions given.  Eager to go home.   Discharge Exam: Vitals:   11/10/19 2037 11/11/19 0519  BP: 140/69 (!) 142/62  Pulse: 73 71  Resp: 17 20  Temp: 98.9 F (37.2 C) 99 F (37.2 C)  SpO2: 96% 96%   Vitals:    11/10/19 0505 11/10/19 1812 11/10/19 2037 11/11/19 0519  BP: 129/65 126/62 140/69 (!) 142/62  Pulse: 75 77 73 71  Resp: 18 19 17 20   Temp: 98.6 F (37 C) 98.3 F (36.8 C) 98.9 F (37.2 C) 99 F (37.2 C)  TempSrc: Oral Oral Oral Oral  SpO2: 97% 97% 96% 96%  Weight:      Height:        General: Pt is alert, awake, not in acute distress, on room air. Cardiovascular: RRR, S1/S2 +, no rubs, no gallops Respiratory: CTA bilaterally, no wheezing, no rhonchi Abdominal: Soft, mild tenderness along the epigastrium and along the tube site.  ND, bowel sounds + Extremities: no edema, no cyanosis    The results of significant diagnostics from this hospitalization (including imaging, microbiology, ancillary and laboratory) are listed below for reference.     Microbiology: Recent Results (from the past 240 hour(s))  Respiratory Panel by RT PCR (Flu A&B, Covid) - Nasopharyngeal Swab     Status: None   Collection Time: 11/04/19 10:50 AM   Specimen: Nasopharyngeal Swab  Result Value Ref Range Status   SARS Coronavirus 2 by RT PCR NEGATIVE NEGATIVE Final    Comment: (NOTE) SARS-CoV-2 target nucleic acids are NOT DETECTED. The SARS-CoV-2 RNA is generally detectable in upper respiratoy specimens during the acute phase of  infection. The lowest concentration of SARS-CoV-2 viral copies this assay can detect is 131 copies/mL. A negative result does not preclude SARS-Cov-2 infection and should not be used as the sole basis for treatment or other patient management decisions. A negative result may occur with  improper specimen collection/handling, submission of specimen other than nasopharyngeal swab, presence of viral mutation(s) within the areas targeted by this assay, and inadequate number of viral copies (<131 copies/mL). A negative result must be combined with clinical observations, patient history, and epidemiological information. The expected result is Negative. Fact Sheet for Patients:   PinkCheek.be Fact Sheet for Healthcare Providers:  GravelBags.it This test is not yet ap proved or cleared by the [place] FDA and  has been authorized for detection and/or diagnosis of SARS-CoV-2 by FDA under an Emergency Use Authorization (EUA). This EUA will remain  in effect (meaning this test can be used) for the duration of the COVID-19 declaration under Section 564(b)(1) of the Act, 21 U.S.C. section 360bbb-3(b)(1), unless the authorization is terminated or revoked sooner.    Influenza A by PCR NEGATIVE NEGATIVE Final   Influenza B by PCR NEGATIVE NEGATIVE Final    Comment: (NOTE) The Xpert Xpress SARS-CoV-2/FLU/RSV assay is intended as an aid in  the diagnosis of influenza from Nasopharyngeal swab specimens and  should not be used as a sole basis for treatment. Nasal washings and  aspirates are unacceptable for Xpert Xpress SARS-CoV-2/FLU/RSV  testing. Fact Sheet for Patients: PinkCheek.be Fact Sheet for Healthcare Providers: GravelBags.it This test is not yet approved or cleared by the [place] FDA and  has been authorized for detection and/or diagnosis of SARS-CoV-2 by  FDA under an Emergency Use Authorization (EUA). This EUA will remain  in effect (meaning this test can be used) for the duration of the  Covid-19 declaration under Section 564(b)(1) of the Act, 21  U.S.C. section 360bbb-3(b)(1), unless the authorization is  terminated or revoked. Performed at [facility], [place] [address], [place], [place] [address]   Aerobic/Anaerobic Culture (surgical/deep wound)     Status: None (Preliminary result)   Collection Time: 11/06/19  3:25 PM   Specimen: BILE  Result Value Ref Range Status   Specimen Description BILE  Final   Special Requests NONE  Final   Gram Stain   Final    MODERATE WBC PRESENT, PREDOMINANTLY MONONUCLEAR NO ORGANISMS  SEEN    Culture   Final    NO GROWTH 4 DAYS NO ANAEROBES ISOLATED; CULTURE IN PROGRESS FOR 5 DAYS Performed at [facility], [place] [address], [place], [place] [address]    Report Status PENDING  Incomplete     Labs: BNP (last 3 results) Recent Labs    09/04/19 1723  BNP [id number]   Basic Metabolic Panel: Recent Labs  Lab 11/07/19 0231 11/08/19 0409 11/09/19 0309 11/10/19 0506 11/11/19 0144  NA 137 139 138 139 138  K 4.7 4.6 5.2* 4.9 3.6  CL 109 113* 110 114* 109  CO2 21* 20* 19* 19* 22  GLUCOSE 147* 158* 139* 91 94  BUN 15 19 25* 23 17  CREATININE 1.18* 1.22* 1.24* 1.07* 1.02*  CALCIUM 9.6 9.8 9.8 9.5 9.5  MG 1.6*  --   --  1.8  --   PHOS 3.5  --   --  2.9  --    Liver Function Tests: Recent Labs  Lab 11/07/19 0231 11/08/19 0409 11/09/19 0309 11/10/19 0506 11/11/19 0144  AST 149* 60* 64* 42* 19  ALT 86* 65* 52* 39 33  ALKPHOS 1,033* 810* 821* 548* 464*  BILITOT 3.0* 2.4* 2.8* 2.7* 1.8*  PROT 4.7* 4.6* 4.9* 4.1* 4.2*  ALBUMIN 2.0* 2.0* 2.2* 1.9* 1.8*   Recent Labs  Lab 11/09/19 0309 11/10/19 0506 11/11/19 0144  LIPASE 1,113* 112* 96*   No results for input(s): AMMONIA in the last 168 hours. CBC: Recent Labs  Lab 11/05/19 0249 11/07/19 0231 11/10/19 0506  WBC 4.8 8.5 9.8  NEUTROABS  --  7.4 8.0*  HGB 9.8* 10.3* 9.9*  HCT 30.4* 31.5* 30.5*  MCV 92.7 91.0 95.6  PLT 208 335 245   Cardiac Enzymes: No results for input(s): CKTOTAL, CKMB, CKMBINDEX, TROPONINI in the last 168 hours. BNP: Invalid input(s): POCBNP CBG: Recent Labs  Lab 11/05/19 0017 11/05/19 0332 11/05/19 0724 11/05/19 1142 11/06/19 0751  GLUCAP 82 71 70 76 135*   D-Dimer No results for input(s): DDIMER in the last 72 hours. Hgb A1c No results for input(s): HGBA1C in the last 72 hours. Lipid Profile No results for input(s): CHOL, HDL, LDLCALC, TRIG, CHOLHDL, LDLDIRECT in the last 72 hours. Thyroid function studies No results for input(s): TSH, T4TOTAL, T3FREE, THYROIDAB  in the last 72 hours.  Invalid input(s): FREET3 Anemia work up No results for input(s): VITAMINB12, FOLATE, FERRITIN, TIBC, IRON, RETICCTPCT in the last 72 hours. Urinalysis    Component Value Date/Time   COLORURINE YELLOW 05/02/2019 0832   APPEARANCEUR CLEAR 05/02/2019 0832   LABSPEC 1.017 05/02/2019 0832   PHURINE 5.0 05/02/2019 0832   GLUCOSEU NEGATIVE 05/02/2019 0832   HGBUR SMALL (A) 05/02/2019 0832   BILIRUBINUR NEGATIVE 05/02/2019 0832   KETONESUR NEGATIVE 05/02/2019 0832   PROTEINUR NEGATIVE 05/02/2019 0832   UROBILINOGEN 0.2 06/01/2010 0915   NITRITE NEGATIVE 05/02/2019 0832   LEUKOCYTESUR SMALL (A) 05/02/2019 0832   Sepsis Labs Invalid input(s): PROCALCITONIN,  WBC,  LACTICIDVEN Microbiology Recent Results (from the past 240 hour(s))  Respiratory Panel by RT PCR (Flu A&B, Covid) - Nasopharyngeal Swab     Status: None   Collection Time: 11/04/19 10:50 AM   Specimen: Nasopharyngeal Swab  Result Value Ref Range Status   SARS Coronavirus 2 by RT PCR NEGATIVE NEGATIVE Final    Comment: (NOTE) SARS-CoV-2 target nucleic acids are NOT DETECTED. The SARS-CoV-2 RNA is generally detectable in upper respiratoy specimens during the acute phase of infection. The lowest concentration of SARS-CoV-2 viral copies this assay can detect is 131 copies/mL. A negative result does not preclude SARS-Cov-2 infection and should not be used as the sole basis for treatment or other patient management decisions. A negative result may occur with  improper specimen collection/handling, submission of specimen other than nasopharyngeal swab, presence of viral mutation(s) within the areas targeted by this assay, and inadequate number of viral copies (<131 copies/mL). A negative result must be combined with clinical observations, patient history, and epidemiological information. The expected result is Negative. Fact Sheet for Patients:  PinkCheek.be Fact Sheet for  Healthcare Providers:  GravelBags.it This test is not yet ap proved or cleared by the [place] FDA and  has been authorized for detection and/or diagnosis of SARS-CoV-2 by FDA under an Emergency Use Authorization (EUA). This EUA will remain  in effect (meaning this test can be used) for the duration of the COVID-19 declaration under Section 564(b)(1) of the Act, 21 U.S.C. section 360bbb-3(b)(1), unless the authorization is terminated or revoked sooner.    Influenza A by PCR NEGATIVE NEGATIVE Final   Influenza B by PCR NEGATIVE  NEGATIVE Final    Comment: (NOTE) The Xpert Xpress SARS-CoV-2/FLU/RSV assay is intended as an aid in  the diagnosis of influenza from Nasopharyngeal swab specimens and  should not be used as a sole basis for treatment. Nasal washings and  aspirates are unacceptable for Xpert Xpress SARS-CoV-2/FLU/RSV  testing. Fact Sheet for Patients: PinkCheek.be Fact Sheet for Healthcare Providers: GravelBags.it This test is not yet approved or cleared by the [place] FDA and  has been authorized for detection and/or diagnosis of SARS-CoV-2 by  FDA under an Emergency Use Authorization (EUA). This EUA will remain  in effect (meaning this test can be used) for the duration of the  Covid-19 declaration under Section 564(b)(1) of the Act, 21  U.S.C. section 360bbb-3(b)(1), unless the authorization is  terminated or revoked. Performed at [facility], [place] [address], [place], [place] [address]   Aerobic/Anaerobic Culture (surgical/deep wound)     Status: None (Preliminary result)   Collection Time: 11/06/19  3:25 PM   Specimen: BILE  Result Value Ref Range Status   Specimen Description BILE  Final   Special Requests NONE  Final   Gram Stain   Final    MODERATE WBC PRESENT, PREDOMINANTLY MONONUCLEAR NO ORGANISMS SEEN    Culture   Final    NO GROWTH 4 DAYS NO ANAEROBES  ISOLATED; CULTURE IN PROGRESS FOR 5 DAYS Performed at [facility], [place] [address], [place], [place] [address]    Report Status PENDING  Incomplete     Time coordinating discharge:  40 minutes  SIGNED:   Barb Merino, MD  [facility] 11/11/2019, 11:39 AM

## 2019-11-11 NOTE — Telephone Encounter (Signed)
[name] has been cld and scheduled to see Dr. Benay Spice on 2/11 at 2pm. Appt date and time has been given to the pt's spouse who's aware to arrive 15 minutes early.

## 2019-11-11 NOTE — Consult Note (Signed)
   [facility] CM Inpatient Consult   11/11/2019  [name] 1934/12/31 [id number]    Patient screened for potential [facility]) Care Management servicesneeded [facility] Medicare benefits with23%highrisk score forunplanned readmissionand hospitalizations.  Review of medical record and MDnotesreveal as: 85 year old female, recently treated for pneumonia. History of gastrojejunostomy for a stricture and J-tube placement who was following outpatient and found to have painless jaundice on routine evaluation.ERCP and biopsy consistent with adenocarcinoma of the pancreas. Status post percutaneous biliary drain by interventional radiology 2/4, changed to external/internal drain 2/6.  Called to speak to patient in the roombut husband [name]) answered and reports that patient is getting dressed for discharge.HIPAA information verified. Hestates that patient will discharge home today and waiting to talk to home health RN. [place] care management services with him and heverbalizedhaving noneeds/ barriers identified regarding medications (husband helping to manage);pharmacy (using [facility] at [place]); transportation (husband provides);and states thattheir daughter is there to assist him with patient's needs. Patient's husband made awareof need for follow-up withprimary care provider post hospitalization.    Patient's husband indicated no current needsforTHNservices at this time, however, would like [facility]) brochure and informationbe mailed to their address for future needs that may arise.   Husband endorses Dr. Burnard Bunting with [facility] astheprimary care provider, office listed as providing transitionof care.  Per transition of care CM note reviewed, shows that current disposition is home with home health RN- biliary drain care Alvis Lemmings). Husband has been taught drain care and is aware that  home health RN unable to make daily visits.    Of note, [facility] Care Management services does not replace or interfere with any services that are arranged by transition of care case management or social work.    For questions and referral, please contact:  Ezelle Surprenant A. Tache Bobst, BSN, RN-BC [facility] Liaison Cell: [phone]

## 2019-11-13 ENCOUNTER — Inpatient Hospital Stay: Payer: Medicare PPO | Attending: Oncology | Admitting: Oncology

## 2019-11-13 ENCOUNTER — Other Ambulatory Visit (HOSPITAL_COMMUNITY): Payer: Self-pay | Admitting: Interventional Radiology

## 2019-11-13 ENCOUNTER — Other Ambulatory Visit: Payer: Self-pay

## 2019-11-13 VITALS — BP 136/71 | HR 70 | Temp 98.0°F | Resp 18 | Ht 63.0 in | Wt 205.4 lb

## 2019-11-13 DIAGNOSIS — R634 Abnormal weight loss: Secondary | ICD-10-CM

## 2019-11-13 DIAGNOSIS — C259 Malignant neoplasm of pancreas, unspecified: Secondary | ICD-10-CM

## 2019-11-13 DIAGNOSIS — E119 Type 2 diabetes mellitus without complications: Secondary | ICD-10-CM

## 2019-11-13 DIAGNOSIS — R109 Unspecified abdominal pain: Secondary | ICD-10-CM

## 2019-11-13 DIAGNOSIS — K831 Obstruction of bile duct: Secondary | ICD-10-CM

## 2019-11-13 DIAGNOSIS — Z85820 Personal history of malignant melanoma of skin: Secondary | ICD-10-CM

## 2019-11-13 DIAGNOSIS — I10 Essential (primary) hypertension: Secondary | ICD-10-CM

## 2019-11-13 DIAGNOSIS — R601 Generalized edema: Secondary | ICD-10-CM

## 2019-11-13 DIAGNOSIS — C250 Malignant neoplasm of head of pancreas: Secondary | ICD-10-CM

## 2019-11-13 DIAGNOSIS — E43 Unspecified severe protein-calorie malnutrition: Secondary | ICD-10-CM | POA: Diagnosis not present

## 2019-11-13 DIAGNOSIS — R229 Localized swelling, mass and lump, unspecified: Secondary | ICD-10-CM

## 2019-11-13 DIAGNOSIS — R531 Weakness: Secondary | ICD-10-CM

## 2019-11-13 NOTE — Progress Notes (Signed)
[facility] New Patient Consult   Requesting MD: Burnard Bunting, [place],  [place] [address]   [name] 85 y.o.  11/30/1934    Reason for Consult: Pancreas cancer   HPI: [name] developed nausea/vomiting, early satiety, and weight loss 20.  An upper endoscopy on 04/28/2019 revealed a large amount of food residue in the gastric fundus and body.  There was food residue in the duodenum.  The endoscope could not be advanced to the second part of the duodenum. A CT of the abdomen and pelvis on 05/01/2019 revealed a dilated fluid-filled stomach with gradual tapering to the gastroduodenal junction.  A repeat upper endoscopy on 05/03/2019 found retained gastric fluid.  Multiple gastric polyps were biopsied.  There was duodenal stenosis.  This was biopsied.  The stomach gland polyp returned as a fundic gland polyp with no evidence of malignancy.  There was inflamed small bowel mucosa and no evidence of malignancy duodenal biopsy.  Dr. Johney Maine was consulted and performed a gastrojejunostomy and gastrojejunostomy feeding tube on 05/06/2019.  The stomach was dilated.  No evidence of carcinomatosis.   She reports feeling better after the gastrojejunostomy.  A repeat upper endoscopy on 06/13/2019 found evidence of a gastrojejunostomy and was otherwise unremarkable.  She was admitted on 11/04/2019 with elevated liver enzymes and jaundice.  A CT of the abdomen pelvis on 11/03/2019 revealed marked intrahepatic biliary dilatation with dilatation of the common bile duct.  An MRI on 11/04/2019 revealed mild diffuse pancreatic ductal dilatation and ill-defined enhancing lesion in the pancreas head measuring 2.1 x 1.8 cm.  There is obstruction of the distal common bile duct.  Severe diffuse biliary ductal dilatation.  No pathologic enlarged lymph nodes.  An EUS on 11/05/1998 85 year old duodenal stenosis with abnormal tissue present.  This was biopsied.  Jejunal versus duodenal mass  in the presumed a ferret limb.  A mass in the pancreas head is suspicious for adenocarcinoma.  No malignant appearing lymph nodes in the celiac, peripancreatic region, or porta hepatis.  Ascites was present. A biopsy from the duodenum stricture revealed duodenal adenoma, another biopsy from the small bowel returned as a duodenal adenoma with focal high-grade dysplasia.  No invasive carcinoma.  The biopsy from the pancreas head revealed malignant cells consistent with adenocarcinoma. Fluid after placement of a biliary drain revealed atypical cells suspicious for malignancy.  On 11/06/2019 she underwent placement of an external biliary drain.  The drain was converted to an internal/external biliary drain with balloon angioplasty of a malignant stricture of the common bile duct on 11/08/2019.  [name] was discharged home on 11/11/2019.  She reports feeling very weak.  She developed leg edema while in the hospital. Past Medical History:  Diagnosis Date  . Allergy   . Anemia   . Anxiety   . Bronchiectasis ([facility]) 10/01/2015  . Cataract    removed both eyes  . Depression   . Diabetes mellitus without complication ([facility])    diet controlled DM- no meds   . Diverticulosis of colon (without mention of hemorrhage) 07/17/2007  . Gastritis 07/17/2007  . GERD (gastroesophageal reflux disease)   . Glaucoma   . History of fatty infiltration of liver   . Hyperlipemia   . Hyperparathyroidism ([facility])   . Hyperparathyroidism, primary ([facility]) 01/01/2017  . Hypertension    under control   . Jaundice 11/04/2019  . Lumbosacral root lesions, not elsewhere classified 04/13/2014  . Obese   . Osteoarthritis   .  Osteoporosis   . PONV (postoperative nausea and vomiting)    Not in last several surgeries  . Renal calculus, right    severe  . Sleep apnea    uses cpap at night   . Thyroid disease     .  G2 P2   .  Melanoma at the upper back-1970s  Past Surgical History:  Procedure Laterality Date  . APPENDECTOMY  1974   . BIOPSY  05/03/2019   Procedure: BIOPSY;  Surgeon: Rush Landmark Telford Nab., MD;  Location: [facility];  Service: Gastroenterology;;  . BIOPSY  11/06/2019   Procedure: BIOPSY;  Surgeon: Irving Copas., MD;  Location: [facility];  Service: Gastroenterology;;  . [place]   LEFT--BENIGN   . CESAREAN SECTION  1974  . CESAREAN SECTION  1971  . CHOLECYSTECTOMY  2000  . COLONOSCOPY  2008  . ESOPHAGOGASTRODUODENOSCOPY  2008  . ESOPHAGOGASTRODUODENOSCOPY (EGD) WITH PROPOFOL N/A 05/03/2019   Procedure: ESOPHAGOGASTRODUODENOSCOPY (EGD) WITH PROPOFOL;  Surgeon: Rush Landmark Telford Nab., MD;  Location: [facility];  Service: Gastroenterology;  Laterality: N/A;  . ESOPHAGOGASTRODUODENOSCOPY (EGD) WITH PROPOFOL N/A 11/06/2019   Procedure: ESOPHAGOGASTRODUODENOSCOPY (EGD) WITH PROPOFOL;  Surgeon: Rush Landmark Telford Nab., MD;  Location: [facility];  Service: Gastroenterology;  Laterality: N/A;  . EYE SURGERY     Lasik, Cataract, Hx of Glaucoma  . FINE NEEDLE ASPIRATION  11/06/2019   Procedure: FINE NEEDLE ASPIRATION (FNA) LINEAR;  Surgeon: Irving Copas., MD;  Location: [facility];  Service: Gastroenterology;;  . Johann Capers N/A 05/06/2019   Procedure: LAPAROSCOPIC GASTROJEJUNOSTOMY and feedling gastrojejunostomy tube;  Surgeon: Michael Boston, MD;  Location: [facility];  Service: General;  Laterality: N/A;  . IR BALLOON DILATION OF BILIARY DUCTS/AMPULLA  11/08/2019  . IR BILIARY DRAIN PLACEMENT WITH CHOLANGIOGRAM  11/06/2019  . IR GJ TUBE CHANGE  06/03/2019  . IR INT EXT BILIARY DRAIN WITH CHOLANGIOGRAM  11/08/2019  . KNEE ARTHROPLASTY  2004   RIGHT-TOTAL   . LUMBAR FUSION  2008  . PARATHYROIDECTOMY  2009   X 2--RIGHT INFERIOR AND LEFT SUPERIOR  . PARATHYROIDECTOMY N/A 01/04/2017   Procedure: RIGHT NECK EXPLORATION WITH PARATHYROIDECTOMY;  Surgeon: Armandina Gemma, MD;  Location: [facility];  Service: General;  Laterality: N/A;  . ROTATOR CUFF REPAIR  2000   RIGHT  . SUBMUCOSAL TATTOO  INJECTION  11/06/2019   Procedure: SUBMUCOSAL TATTOO INJECTION;  Surgeon: Irving Copas., MD;  Location: [facility];  Service: Gastroenterology;;  . TUBAL LIGATION  1974  . UPPER ESOPHAGEAL ENDOSCOPIC ULTRASOUND (EUS) N/A 11/06/2019   Procedure: UPPER ESOPHAGEAL ENDOSCOPIC ULTRASOUND (EUS);  Surgeon: Irving Copas., MD;  Location: [facility];  Service: Gastroenterology;  Laterality: N/A;  . UPPER GASTROINTESTINAL ENDOSCOPY      Medications: Reviewed  Allergies:  Allergies  Allergen Reactions  . Codeine Nausea And Vomiting  . Contrast Media [Iodinated Diagnostic Agents] Hives, Shortness Of Breath and Itching  . Iohexol Hives and Other (See Comments)    REACTION: " TROUBLE BREATHING"  . Aspirin     In high doses bleeding  . Fentanyl     Altered mental state     Social History:   She lives with her husband in [place].  She is retired Gaffer.  She quit smoking cigarettes 20 years ago.  She reports rare alcohol use.  No transfusion history.  No risk factor for HIV or hepatitis.  ROS:   Positives include: Anorexia, 50 pound weight loss, occasional abdominal pain, generalized weakness, pain at the jejunostomy tube-resolved  after removal, leg swelling  A complete ROS was otherwise negative.  Physical Exam:  Blood pressure 136/71, pulse 70, temperature 98 F (36.7 C), temperature source Temporal, resp. rate 18, height 5\' 3"  (1.6 m), weight 205 lb 6.4 oz (93.2 kg), SpO2 100 %.  HEENT: Neck without mass Lungs: Inspiratory rales of the left posterior base, no respiratory distress Cardiac: Regular rate and rhythm Abdomen: Mid upper abdomen biliary drain Vascular: Pitting edema at the lower legs and hands Lymph nodes: No cervical, supraclavicular, axillary, or inguinal nodes Neurologic: Alert and oriented, all extremities, slow gait, ambulates a short distance with assistance, unable to get onto the examination table Skin: No rash Musculoskeletal: No  spine tenderness   LAB:  CBC  Lab Results  Component Value Date   WBC 9.8 11/10/2019   HGB 9.9 (L) 11/10/2019   HCT 30.5 (L) 11/10/2019   MCV 95.6 11/10/2019   PLT 245 11/10/2019   NEUTROABS 8.0 (H) 11/10/2019        CMP  Lab Results  Component Value Date   NA 138 11/11/2019   K 3.6 11/11/2019   CL 109 11/11/2019   CO2 22 11/11/2019   GLUCOSE 94 11/11/2019   BUN 17 11/11/2019   CREATININE 1.02 (H) 11/11/2019   CALCIUM 9.5 11/11/2019   PROT 4.2 (L) 11/11/2019   ALBUMIN 1.8 (L) 11/11/2019   AST 19 11/11/2019   ALT 33 11/11/2019   ALKPHOS 464 (H) 11/11/2019   BILITOT 1.8 (H) 11/11/2019   GFRNONAA 51 (L) 11/11/2019   GFRAA 59 (L) 11/11/2019   CA 19-9 on 11/06/2019: 493   Imaging:  As per HPI   Assessment/Plan:   1. Pancreas cancer, pancreas head mass, FNA biopsy 11/06/2019-adenocarcinoma  EGD/EUS 11/06/2019-pancreas head mass,T2N0, ascites, duodenal stenosis with abnormal tissue present-biopsy revealed a duodenal adenoma, afferernt limb revealed high-grade dysplasia  MRI/MRCP 11/04/2019-diffuse biliary and pancreatic ductal dilatation, 2 cm lesion in the pancreas head, no evidence of metastatic disease  CT abdomen/pelvis 11/03/2019-severe intrahepatic biliary dilatation and dilatation of the common bile duct, calcified uterine fibroid 2. Biliary obstruction secondary to #1, status post placement of an internal/external biliary drain and dilatation of malignant bile duct stricture on 11/08/2019 3. Gastric outlet obstruction, malignant appearing stricture noted on upper endoscopy 05/11/2019-biopsy negative for malignancy  Gastrojejunostomy and gastrojejunal feeding tube placement 05/06/2019, feeding tube subsequently removed  4.  Diet-controlled diabetes 5.  History of hyperparathyroidism, status post parathyroidectomy 6.  Hypertension 7.  History of melanoma of the back 8.  Anorexia/weight loss secondary to #1   Disposition:   [name] has been diagnosed with  adenocarcinoma of the pancreas.  She appears to have locally advanced disease.  It appears the tumor has caused a duodenal stricture with gastric outlet obstruction and bile duct obstruction.  She has undergone palliative procedures with a gastrojejunostomy and placement of an internal/external biliary drain.  Hyperbilirubinemia and elevated liver enzymes have improved biliary drain.  [name] appears to have severe deconditioning and malnutrition.  I discussed the diagnosis of pancreas cancer and treatment options with [name] and her family.  She does not appear to be a surgical candidate.  We discussed supportive care versus a trial of palliative systemic chemotherapy.  We can consider single agent gemcitabine or gemcitabine/Abraxane if her performance status improves.  We reviewed potential toxicities associated with these chemotherapeutic agents.  [name] will return for an office visit and further discussion in 2-3 weeks.  I will present her case at the GI tumor  conference to review the endoscopic findings, images, and pathology.  Betsy Coder, MD  11/13/2019, 2:21 PM

## 2019-11-14 ENCOUNTER — Encounter: Payer: Self-pay | Admitting: Licensed Clinical Social Worker

## 2019-11-14 ENCOUNTER — Telehealth: Payer: Self-pay

## 2019-11-14 NOTE — Telephone Encounter (Signed)
Received message from [facility] that patient was having increased swelling in her extremities.  Spoke with patient's husband patient is urinating sufficiently, no increased SOB, currently takes Furosemide 20 mg M/W/F.  I consulted with Dr. Benay Spice and per his recommendations advised patient to take Furosemide 20 mg daily until swelling goes down, elevated lower extremities when sitting. If does not improve by beginning of the week call Dr. Reynaldo Minium her PCP.  He verbalized an understanding.

## 2019-11-14 NOTE — Progress Notes (Signed)
[facility] Psychosocial Distress Screening Clinical Social Work  Clinical Social Work was referred by distress screening protocol.  The patient scored a 10 on the Psychosocial Distress Thermometer which indicates severe distress. Clinical Social Worker contacted patient by phone to assess for distress and other psychosocial needs. Patient reports feeling "much better" after speaking with the doctor yesterday and learning more about her diagnosis and treatment plan. Waiting for PT to come to her home today.  She receives significant support from her husband and two adult children. Her daughter lives in [place] but is currently staying with her for at least a week. Son lives locally with his family.   Main concern today is continued swelling in arms, legs, and feet and noticing puffiness in her face. States it feels "uncomfortable". CSW will notify medical team.  CSW informed patient of role of social worker and available support services, including virtual support groups.   ONCBCN DISTRESS SCREENING 11/13/2019  Screening Type Initial Screening  Distress experienced in past week (1-10) 10  Emotional problem type Depression;Nervousness/Anxiety;Adjusting to illness  Information Concerns Type Lack of info about diagnosis;Lack of info about treatment;Lack of info about complementary therapy choices;Lack of info about maintaining fitness  Physical Problem type Getting around;Bathing/dressing;Loss of appetitie;Swollen arms/legs;Skin dry/itchy;Tingling hands/feet  Physician notified of physical symptoms Yes  Referral to clinical psychology No  Referral to clinical social work Yes  Referral to dietition Yes  Referral to financial advocate No  Referral to support programs No  Referral to palliative care No     Clinical Social Worker follow up needed: No.    Shirlette Scarber E, LCSW

## 2019-11-15 ENCOUNTER — Encounter: Payer: Self-pay | Admitting: Gastroenterology

## 2019-11-17 ENCOUNTER — Telehealth: Payer: Self-pay | Admitting: Oncology

## 2019-11-17 NOTE — Telephone Encounter (Signed)
Scheduled per 2/11 sch msg. Called and left a msg. Mailing printout 

## 2019-11-18 NOTE — Progress Notes (Signed)
Patient's husband calls with concern over cough getting increasingly worse.  Sputum is clear, no fever, O2 sats between 95 to 100%.  Consulted with Dr. Benay Spice and call him back advised patient to take Mucinex, if develops fever, chills, increased SOB will need to be evaluated at ED.  He states she also has inhalers, advised her to use inhalers as prescribed.  The patient's husband and daughter both verbalized an understanding.

## 2019-11-26 ENCOUNTER — Other Ambulatory Visit: Payer: Self-pay

## 2019-12-01 DIAGNOSIS — K3189 Other diseases of stomach and duodenum: Secondary | ICD-10-CM | POA: Diagnosis not present

## 2019-12-01 DIAGNOSIS — J90 Pleural effusion, not elsewhere classified: Secondary | ICD-10-CM | POA: Diagnosis not present

## 2019-12-01 DIAGNOSIS — K9189 Other postprocedural complications and disorders of digestive system: Secondary | ICD-10-CM | POA: Diagnosis not present

## 2019-12-01 DIAGNOSIS — N200 Calculus of kidney: Secondary | ICD-10-CM | POA: Diagnosis not present

## 2019-12-01 DIAGNOSIS — N281 Cyst of kidney, acquired: Secondary | ICD-10-CM | POA: Diagnosis not present

## 2019-12-01 DIAGNOSIS — G893 Neoplasm related pain (acute) (chronic): Secondary | ICD-10-CM | POA: Diagnosis not present

## 2019-12-01 DIAGNOSIS — C250 Malignant neoplasm of head of pancreas: Secondary | ICD-10-CM | POA: Diagnosis not present

## 2019-12-01 DIAGNOSIS — I129 Hypertensive chronic kidney disease with stage 1 through stage 4 chronic kidney disease, or unspecified chronic kidney disease: Secondary | ICD-10-CM | POA: Diagnosis not present

## 2019-12-01 DIAGNOSIS — E1122 Type 2 diabetes mellitus with diabetic chronic kidney disease: Secondary | ICD-10-CM | POA: Diagnosis not present

## 2019-12-02 DIAGNOSIS — J90 Pleural effusion, not elsewhere classified: Secondary | ICD-10-CM | POA: Diagnosis not present

## 2019-12-02 DIAGNOSIS — K9189 Other postprocedural complications and disorders of digestive system: Secondary | ICD-10-CM | POA: Diagnosis not present

## 2019-12-02 DIAGNOSIS — C250 Malignant neoplasm of head of pancreas: Secondary | ICD-10-CM | POA: Diagnosis not present

## 2019-12-02 DIAGNOSIS — G893 Neoplasm related pain (acute) (chronic): Secondary | ICD-10-CM | POA: Diagnosis not present

## 2019-12-02 DIAGNOSIS — E1122 Type 2 diabetes mellitus with diabetic chronic kidney disease: Secondary | ICD-10-CM | POA: Diagnosis not present

## 2019-12-02 DIAGNOSIS — N281 Cyst of kidney, acquired: Secondary | ICD-10-CM | POA: Diagnosis not present

## 2019-12-02 DIAGNOSIS — K3189 Other diseases of stomach and duodenum: Secondary | ICD-10-CM | POA: Diagnosis not present

## 2019-12-02 DIAGNOSIS — N200 Calculus of kidney: Secondary | ICD-10-CM | POA: Diagnosis not present

## 2019-12-02 DIAGNOSIS — I129 Hypertensive chronic kidney disease with stage 1 through stage 4 chronic kidney disease, or unspecified chronic kidney disease: Secondary | ICD-10-CM | POA: Diagnosis not present

## 2019-12-03 ENCOUNTER — Other Ambulatory Visit: Payer: Self-pay

## 2019-12-03 ENCOUNTER — Inpatient Hospital Stay: Payer: Medicare PPO | Admitting: Nutrition

## 2019-12-03 ENCOUNTER — Inpatient Hospital Stay: Payer: Medicare PPO | Attending: Oncology | Admitting: Oncology

## 2019-12-03 VITALS — BP 132/71 | HR 81 | Temp 98.3°F | Resp 18 | Ht 63.0 in | Wt 173.7 lb

## 2019-12-03 DIAGNOSIS — I129 Hypertensive chronic kidney disease with stage 1 through stage 4 chronic kidney disease, or unspecified chronic kidney disease: Secondary | ICD-10-CM | POA: Diagnosis not present

## 2019-12-03 DIAGNOSIS — C259 Malignant neoplasm of pancreas, unspecified: Secondary | ICD-10-CM | POA: Diagnosis not present

## 2019-12-03 DIAGNOSIS — K3189 Other diseases of stomach and duodenum: Secondary | ICD-10-CM | POA: Diagnosis not present

## 2019-12-03 DIAGNOSIS — J90 Pleural effusion, not elsewhere classified: Secondary | ICD-10-CM | POA: Diagnosis not present

## 2019-12-03 DIAGNOSIS — Z9089 Acquired absence of other organs: Secondary | ICD-10-CM | POA: Insufficient documentation

## 2019-12-03 DIAGNOSIS — R630 Anorexia: Secondary | ICD-10-CM | POA: Diagnosis not present

## 2019-12-03 DIAGNOSIS — K9189 Other postprocedural complications and disorders of digestive system: Secondary | ICD-10-CM | POA: Diagnosis not present

## 2019-12-03 DIAGNOSIS — C250 Malignant neoplasm of head of pancreas: Secondary | ICD-10-CM | POA: Insufficient documentation

## 2019-12-03 DIAGNOSIS — G893 Neoplasm related pain (acute) (chronic): Secondary | ICD-10-CM | POA: Diagnosis not present

## 2019-12-03 DIAGNOSIS — I10 Essential (primary) hypertension: Secondary | ICD-10-CM | POA: Diagnosis not present

## 2019-12-03 DIAGNOSIS — Z85820 Personal history of malignant melanoma of skin: Secondary | ICD-10-CM | POA: Insufficient documentation

## 2019-12-03 DIAGNOSIS — R634 Abnormal weight loss: Secondary | ICD-10-CM | POA: Insufficient documentation

## 2019-12-03 DIAGNOSIS — E1140 Type 2 diabetes mellitus with diabetic neuropathy, unspecified: Secondary | ICD-10-CM | POA: Insufficient documentation

## 2019-12-03 DIAGNOSIS — N281 Cyst of kidney, acquired: Secondary | ICD-10-CM | POA: Diagnosis not present

## 2019-12-03 DIAGNOSIS — Z8639 Personal history of other endocrine, nutritional and metabolic disease: Secondary | ICD-10-CM | POA: Diagnosis not present

## 2019-12-03 DIAGNOSIS — N200 Calculus of kidney: Secondary | ICD-10-CM | POA: Diagnosis not present

## 2019-12-03 DIAGNOSIS — E1122 Type 2 diabetes mellitus with diabetic chronic kidney disease: Secondary | ICD-10-CM | POA: Diagnosis not present

## 2019-12-03 NOTE — Progress Notes (Signed)
85 year old female diagnosed with pancreas cancer, biliary obstruction, gastric outlet obstruction with gastrojejunostomy and gastrojejunal feeding tube which was subsequently removed.  She is a patient of Dr. Julieanne Manson.  Past medical history includes diabetes type 2, hypertension, anemia, depression, GERD, hyperlipidemia, hypertension, osteoporosis, and tobacco.  Medications include Lasix, Synthroid, Remeron, Zofran, and Protonix.  Labs include creatinine 1.02, albumin 1.8 on February 9.  Height: 5 feet 3 inches. Weight: 173.7 pounds March 3. Patient weighed 205.4 pounds on November 13, 2019. Patient weighed 219 pounds June 13, 2019. BMI: 30.77.  I spoke with patient and her husband in my office and her daughter via [place] [facility]. Patient reports she has had nausea, vomiting, early satiety, and indigestion. She reports her nausea and her pain has improved.  Edema has improved. She has not had much of an appetite or a taste for food. Reports she has been tolerating some flounder, baked potato, pizza, cold cereal, eggs and egg salad. She likes sugar and salt on her food. She drinks soft drinks and has tried some oral nutrition supplements.  She reports some intolerance to oral nutrition supplements. Family requesting additional information on how to increase fluids. Patient has lost 15% of her body weight over 3 weeks which is significant.  Nutrition diagnosis: Unintended weight loss related to pancreas cancer and associated treatments as evidenced by 20% weight loss over 6 months.  Intervention: Educated patient on consuming smaller more frequent meals and snacks utilizing high-calorie, high-protein foods. Reviewed high-protein food options and snack choices. Encourage patient to try oral nutrition supplements in small amounts for improved tolerance.  Provided samples. Brief education provided on improving indigestion/gas. Provided fact sheets.  Questions were answered.  Teach  back method used.  Contact information given.  Monitoring, evaluation, goals: Patient will tolerate increased calories and protein to minimize weight loss.  Next visit: Patient will contact me with questions.  **Disclaimer: This note was dictated with voice recognition software. Similar sounding words can inadvertently be transcribed and this note may contain transcription errors which may not have been corrected upon publication of note.**

## 2019-12-03 NOTE — Progress Notes (Signed)
  [facility] OFFICE PROGRESS NOTE   Diagnosis: Pancreas cancer  INTERVAL HISTORY:   Ms. [name] returns for a scheduled visit.  She reports feeling much better compared to when we saw her last month.  Peripheral edema has resolved.  She has lost a significant amount of weight.  She continues to have a poor appetite.  No nausea or abdominal pain.  Objective:  Vital signs in last 24 hours:  Blood pressure 132/71, pulse 81, temperature 98.3 F (36.8 C), temperature source Temporal, resp. rate 18, height 5\' 3"  (1.6 m), weight 173 lb 11.2 oz (78.8 kg), SpO2 98 %.    HEENT: Sclera anicteric Resp: Lungs clear bilaterally Cardio: Regular rate and rhythm GI: No hepatosplenomegaly, nontender, upper abdomen biliary drain with a gauze dressing Vascular: Trace edema at the left greater than right lower leg   Lab Results:  Lab Results  Component Value Date   WBC 9.8 11/10/2019   HGB 9.9 (L) 11/10/2019   HCT 30.5 (L) 11/10/2019   MCV 95.6 11/10/2019   PLT 245 11/10/2019   NEUTROABS 8.0 (H) 11/10/2019    CMP  Lab Results  Component Value Date   NA 138 11/11/2019   K 3.6 11/11/2019   CL 109 11/11/2019   CO2 22 11/11/2019   GLUCOSE 94 11/11/2019   BUN 17 11/11/2019   CREATININE 1.02 (H) 11/11/2019   CALCIUM 9.5 11/11/2019   PROT 4.2 (L) 11/11/2019   ALBUMIN 1.8 (L) 11/11/2019   AST 19 11/11/2019   ALT 33 11/11/2019   ALKPHOS 464 (H) 11/11/2019   BILITOT 1.8 (H) 11/11/2019   GFRNONAA 51 (L) 11/11/2019   GFRAA 59 (L) 11/11/2019     Medications: I have reviewed the patient's current medications.   Assessment/Plan: 1. Pancreas cancer, pancreas head mass, FNA biopsy 11/06/2019-adenocarcinoma  EGD/EUS 11/06/2019-pancreas head mass,T2N0, ascites, duodenal stenosis with abnormal tissue present-biopsy revealed a duodenal adenoma, afferernt limb revealed high-grade dysplasia  MRI/MRCP 11/04/2019-diffuse biliary and pancreatic ductal dilatation, 2 cm lesion in the  pancreas head, no evidence of metastatic disease  CT abdomen/pelvis 11/03/2019-severe intrahepatic biliary dilatation and dilatation of the common bile duct, calcified uterine fibroid 2. Biliary obstruction secondary to #1, status post placement of an internal/external biliary drain and dilatation of malignant bile duct stricture on 11/08/2019 3. Gastric outlet obstruction, malignant appearing stricture noted on upper endoscopy 05/11/2019-biopsy negative for malignancy  Gastrojejunostomy and gastrojejunal feeding tube placement 05/06/2019, feeding tube subsequently removed  4.  Diet-controlled diabetes 5.  History of hyperparathyroidism, status post parathyroidectomy 6.  Hypertension 7.  History of melanoma of the back 8.  Anorexia/weight loss secondary to #1 9.  Neuropathy   Disposition: [name] has locally advanced pancreas cancer.  Her performance status appears significantly improved compared to when I saw her last month.  She continues to have anorexia.  She will meet with the Cancer center nutritionist today.  The weight loss is likely in large part due to resolution of peripheral edema.  We discussed treatment options including supportive care versus a trial of chemotherapy.  We discussed side effects associated with gemcitabine and Abraxane.  She has pre-existing neuropathy which may limit her ability to tolerate Abraxane.  The plan is to continue observation for now.  She will work on her activity level and diet.  She will return for an office visit and further discussion in approximately 3 weeks.  Betsy Coder, MD  12/03/2019  10:45 AM

## 2019-12-04 ENCOUNTER — Telehealth: Payer: Self-pay | Admitting: Oncology

## 2019-12-04 DIAGNOSIS — N281 Cyst of kidney, acquired: Secondary | ICD-10-CM | POA: Diagnosis not present

## 2019-12-04 DIAGNOSIS — J90 Pleural effusion, not elsewhere classified: Secondary | ICD-10-CM | POA: Diagnosis not present

## 2019-12-04 DIAGNOSIS — N200 Calculus of kidney: Secondary | ICD-10-CM | POA: Diagnosis not present

## 2019-12-04 DIAGNOSIS — K9189 Other postprocedural complications and disorders of digestive system: Secondary | ICD-10-CM | POA: Diagnosis not present

## 2019-12-04 DIAGNOSIS — C250 Malignant neoplasm of head of pancreas: Secondary | ICD-10-CM | POA: Diagnosis not present

## 2019-12-04 DIAGNOSIS — G893 Neoplasm related pain (acute) (chronic): Secondary | ICD-10-CM | POA: Diagnosis not present

## 2019-12-04 DIAGNOSIS — K3189 Other diseases of stomach and duodenum: Secondary | ICD-10-CM | POA: Diagnosis not present

## 2019-12-04 DIAGNOSIS — E1122 Type 2 diabetes mellitus with diabetic chronic kidney disease: Secondary | ICD-10-CM | POA: Diagnosis not present

## 2019-12-04 DIAGNOSIS — I129 Hypertensive chronic kidney disease with stage 1 through stage 4 chronic kidney disease, or unspecified chronic kidney disease: Secondary | ICD-10-CM | POA: Diagnosis not present

## 2019-12-04 NOTE — Telephone Encounter (Signed)
Scheduled per los. Called and spoke with patients husband. Confirmed appt  °

## 2019-12-10 DIAGNOSIS — K9189 Other postprocedural complications and disorders of digestive system: Secondary | ICD-10-CM | POA: Diagnosis not present

## 2019-12-10 DIAGNOSIS — C250 Malignant neoplasm of head of pancreas: Secondary | ICD-10-CM | POA: Diagnosis not present

## 2019-12-10 DIAGNOSIS — K3189 Other diseases of stomach and duodenum: Secondary | ICD-10-CM | POA: Diagnosis not present

## 2019-12-10 DIAGNOSIS — E1122 Type 2 diabetes mellitus with diabetic chronic kidney disease: Secondary | ICD-10-CM | POA: Diagnosis not present

## 2019-12-10 DIAGNOSIS — I129 Hypertensive chronic kidney disease with stage 1 through stage 4 chronic kidney disease, or unspecified chronic kidney disease: Secondary | ICD-10-CM | POA: Diagnosis not present

## 2019-12-10 DIAGNOSIS — N281 Cyst of kidney, acquired: Secondary | ICD-10-CM | POA: Diagnosis not present

## 2019-12-10 DIAGNOSIS — G893 Neoplasm related pain (acute) (chronic): Secondary | ICD-10-CM | POA: Diagnosis not present

## 2019-12-10 DIAGNOSIS — J90 Pleural effusion, not elsewhere classified: Secondary | ICD-10-CM | POA: Diagnosis not present

## 2019-12-10 DIAGNOSIS — N200 Calculus of kidney: Secondary | ICD-10-CM | POA: Diagnosis not present

## 2019-12-12 DIAGNOSIS — N200 Calculus of kidney: Secondary | ICD-10-CM | POA: Diagnosis not present

## 2019-12-12 DIAGNOSIS — E1122 Type 2 diabetes mellitus with diabetic chronic kidney disease: Secondary | ICD-10-CM | POA: Diagnosis not present

## 2019-12-12 DIAGNOSIS — J90 Pleural effusion, not elsewhere classified: Secondary | ICD-10-CM | POA: Diagnosis not present

## 2019-12-12 DIAGNOSIS — K3189 Other diseases of stomach and duodenum: Secondary | ICD-10-CM | POA: Diagnosis not present

## 2019-12-12 DIAGNOSIS — K9189 Other postprocedural complications and disorders of digestive system: Secondary | ICD-10-CM | POA: Diagnosis not present

## 2019-12-12 DIAGNOSIS — I129 Hypertensive chronic kidney disease with stage 1 through stage 4 chronic kidney disease, or unspecified chronic kidney disease: Secondary | ICD-10-CM | POA: Diagnosis not present

## 2019-12-12 DIAGNOSIS — N281 Cyst of kidney, acquired: Secondary | ICD-10-CM | POA: Diagnosis not present

## 2019-12-12 DIAGNOSIS — C250 Malignant neoplasm of head of pancreas: Secondary | ICD-10-CM | POA: Diagnosis not present

## 2019-12-12 DIAGNOSIS — G893 Neoplasm related pain (acute) (chronic): Secondary | ICD-10-CM | POA: Diagnosis not present

## 2019-12-15 DIAGNOSIS — N281 Cyst of kidney, acquired: Secondary | ICD-10-CM | POA: Diagnosis not present

## 2019-12-15 DIAGNOSIS — C250 Malignant neoplasm of head of pancreas: Secondary | ICD-10-CM | POA: Diagnosis not present

## 2019-12-15 DIAGNOSIS — G893 Neoplasm related pain (acute) (chronic): Secondary | ICD-10-CM | POA: Diagnosis not present

## 2019-12-15 DIAGNOSIS — E1122 Type 2 diabetes mellitus with diabetic chronic kidney disease: Secondary | ICD-10-CM | POA: Diagnosis not present

## 2019-12-15 DIAGNOSIS — N200 Calculus of kidney: Secondary | ICD-10-CM | POA: Diagnosis not present

## 2019-12-15 DIAGNOSIS — K9189 Other postprocedural complications and disorders of digestive system: Secondary | ICD-10-CM | POA: Diagnosis not present

## 2019-12-15 DIAGNOSIS — J90 Pleural effusion, not elsewhere classified: Secondary | ICD-10-CM | POA: Diagnosis not present

## 2019-12-15 DIAGNOSIS — I129 Hypertensive chronic kidney disease with stage 1 through stage 4 chronic kidney disease, or unspecified chronic kidney disease: Secondary | ICD-10-CM | POA: Diagnosis not present

## 2019-12-15 DIAGNOSIS — K3189 Other diseases of stomach and duodenum: Secondary | ICD-10-CM | POA: Diagnosis not present

## 2019-12-18 ENCOUNTER — Other Ambulatory Visit: Payer: Self-pay

## 2019-12-18 ENCOUNTER — Encounter (HOSPITAL_COMMUNITY): Payer: Self-pay | Admitting: Radiology

## 2019-12-18 ENCOUNTER — Other Ambulatory Visit (HOSPITAL_COMMUNITY): Payer: Self-pay | Admitting: Interventional Radiology

## 2019-12-18 ENCOUNTER — Ambulatory Visit (HOSPITAL_COMMUNITY)
Admission: RE | Admit: 2019-12-18 | Discharge: 2019-12-18 | Disposition: A | Discharge status: Home / Self Care | Payer: Medicare PPO | Source: Ambulatory Visit | Attending: Interventional Radiology | Admitting: Interventional Radiology

## 2019-12-18 DIAGNOSIS — I129 Hypertensive chronic kidney disease with stage 1 through stage 4 chronic kidney disease, or unspecified chronic kidney disease: Secondary | ICD-10-CM | POA: Diagnosis not present

## 2019-12-18 DIAGNOSIS — K831 Obstruction of bile duct: Secondary | ICD-10-CM

## 2019-12-18 DIAGNOSIS — N200 Calculus of kidney: Secondary | ICD-10-CM | POA: Diagnosis not present

## 2019-12-18 DIAGNOSIS — J90 Pleural effusion, not elsewhere classified: Secondary | ICD-10-CM | POA: Diagnosis not present

## 2019-12-18 DIAGNOSIS — N281 Cyst of kidney, acquired: Secondary | ICD-10-CM | POA: Diagnosis not present

## 2019-12-18 DIAGNOSIS — K3189 Other diseases of stomach and duodenum: Secondary | ICD-10-CM | POA: Diagnosis not present

## 2019-12-18 DIAGNOSIS — K9189 Other postprocedural complications and disorders of digestive system: Secondary | ICD-10-CM | POA: Diagnosis not present

## 2019-12-18 DIAGNOSIS — C250 Malignant neoplasm of head of pancreas: Secondary | ICD-10-CM | POA: Diagnosis not present

## 2019-12-18 DIAGNOSIS — G893 Neoplasm related pain (acute) (chronic): Secondary | ICD-10-CM | POA: Diagnosis not present

## 2019-12-18 DIAGNOSIS — E1122 Type 2 diabetes mellitus with diabetic chronic kidney disease: Secondary | ICD-10-CM | POA: Diagnosis not present

## 2019-12-18 HISTORY — PX: IR PATIENT EVAL TECH 0-60 MINS: IMG5564

## 2019-12-18 MED ORDER — LIDOCAINE HCL 1 % IJ SOLN
INTRAMUSCULAR | Status: AC
  Filled 2019-12-18: qty 20

## 2019-12-18 NOTE — Procedures (Signed)
Patient arrived for drain change. Unable to complete procedure due to patient not being pretreated for contrast allergy.  MD spoke with patient about scheduling for next week.

## 2019-12-19 ENCOUNTER — Other Ambulatory Visit (HOSPITAL_COMMUNITY): Payer: Self-pay | Admitting: Interventional Radiology

## 2019-12-19 DIAGNOSIS — K831 Obstruction of bile duct: Secondary | ICD-10-CM

## 2019-12-22 DIAGNOSIS — N200 Calculus of kidney: Secondary | ICD-10-CM | POA: Diagnosis not present

## 2019-12-22 DIAGNOSIS — J90 Pleural effusion, not elsewhere classified: Secondary | ICD-10-CM | POA: Diagnosis not present

## 2019-12-22 DIAGNOSIS — K3189 Other diseases of stomach and duodenum: Secondary | ICD-10-CM | POA: Diagnosis not present

## 2019-12-22 DIAGNOSIS — C250 Malignant neoplasm of head of pancreas: Secondary | ICD-10-CM | POA: Diagnosis not present

## 2019-12-22 DIAGNOSIS — I129 Hypertensive chronic kidney disease with stage 1 through stage 4 chronic kidney disease, or unspecified chronic kidney disease: Secondary | ICD-10-CM | POA: Diagnosis not present

## 2019-12-22 DIAGNOSIS — K9189 Other postprocedural complications and disorders of digestive system: Secondary | ICD-10-CM | POA: Diagnosis not present

## 2019-12-22 DIAGNOSIS — E1122 Type 2 diabetes mellitus with diabetic chronic kidney disease: Secondary | ICD-10-CM | POA: Diagnosis not present

## 2019-12-22 DIAGNOSIS — N281 Cyst of kidney, acquired: Secondary | ICD-10-CM | POA: Diagnosis not present

## 2019-12-22 DIAGNOSIS — G893 Neoplasm related pain (acute) (chronic): Secondary | ICD-10-CM | POA: Diagnosis not present

## 2019-12-23 ENCOUNTER — Other Ambulatory Visit: Payer: Self-pay | Admitting: Radiology

## 2019-12-23 ENCOUNTER — Inpatient Hospital Stay: Payer: Medicare PPO | Admitting: Nurse Practitioner

## 2019-12-23 ENCOUNTER — Encounter: Payer: Self-pay | Admitting: Nurse Practitioner

## 2019-12-23 ENCOUNTER — Telehealth: Payer: Self-pay | Admitting: Internal Medicine

## 2019-12-23 ENCOUNTER — Other Ambulatory Visit: Payer: Self-pay

## 2019-12-23 VITALS — BP 133/67 | HR 75 | Temp 98.5°F | Resp 18 | Ht 63.0 in | Wt 184.6 lb

## 2019-12-23 DIAGNOSIS — E1140 Type 2 diabetes mellitus with diabetic neuropathy, unspecified: Secondary | ICD-10-CM | POA: Diagnosis not present

## 2019-12-23 DIAGNOSIS — R634 Abnormal weight loss: Secondary | ICD-10-CM | POA: Diagnosis not present

## 2019-12-23 DIAGNOSIS — Z8639 Personal history of other endocrine, nutritional and metabolic disease: Secondary | ICD-10-CM | POA: Diagnosis not present

## 2019-12-23 DIAGNOSIS — R630 Anorexia: Secondary | ICD-10-CM | POA: Diagnosis not present

## 2019-12-23 DIAGNOSIS — C259 Malignant neoplasm of pancreas, unspecified: Secondary | ICD-10-CM

## 2019-12-23 DIAGNOSIS — C250 Malignant neoplasm of head of pancreas: Secondary | ICD-10-CM | POA: Diagnosis not present

## 2019-12-23 DIAGNOSIS — Z85820 Personal history of malignant melanoma of skin: Secondary | ICD-10-CM | POA: Diagnosis not present

## 2019-12-23 DIAGNOSIS — Z9089 Acquired absence of other organs: Secondary | ICD-10-CM | POA: Diagnosis not present

## 2019-12-23 DIAGNOSIS — I10 Essential (primary) hypertension: Secondary | ICD-10-CM | POA: Diagnosis not present

## 2019-12-23 NOTE — Telephone Encounter (Signed)
Scheduled per los. Gave avs and calendar  

## 2019-12-23 NOTE — Progress Notes (Addendum)
  [facility] OFFICE PROGRESS NOTE   Diagnosis: Pancreas cancer  INTERVAL HISTORY:   Ms. [name] returns as scheduled.  Overall she feels she is doing better.  Appetite has improved.  Energy is "a little bit better".  She spends most of the day in the recliner.  She is able to get out of the house a few times a week.  No nausea or vomiting.  No pain.  Bowels moving regularly.  Objective:  Vital signs in last 24 hours:  Blood pressure 133/67, pulse 75, temperature 98.5 F (36.9 C), temperature source Temporal, resp. rate 18, height 5\' 3"  (1.6 m), weight 184 lb 9.6 oz (83.7 kg), SpO2 99 %.    HEENT: Neck without mass.  Sclera anicteric. Lymphatics: No palpable cervical or supraclavicular lymph nodes. GI: Abdomen soft and nontender.  No hepatosplenomegaly.  Upper abdomen biliary drain. Vascular: Trace edema at the lower legs bilaterally left slightly greater than right. Neuro: Alert and oriented.   Lab Results:  Lab Results  Component Value Date   WBC 9.8 11/10/2019   HGB 9.9 (L) 11/10/2019   HCT 30.5 (L) 11/10/2019   MCV 95.6 11/10/2019   PLT 245 11/10/2019   NEUTROABS 8.0 (H) 11/10/2019    Imaging:  No results found.  Medications: I have reviewed the patient's current medications.  Assessment/Plan: 1. Pancreas cancer, pancreas head mass, FNA biopsy 11/06/2019-adenocarcinoma ? EGD/EUS 11/06/2019-pancreas head mass,T2N0, ascites, duodenal stenosis with abnormal tissue present-biopsy revealed a duodenal adenoma, afferernt limb revealed high-grade dysplasia ? MRI/MRCP 11/04/2019-diffuse biliary and pancreatic ductal dilatation, 2 cm lesion in the pancreas head, no evidence of metastatic disease ? CT abdomen/pelvis 11/03/2019-severe intrahepatic biliary dilatation and dilatation of the common bile duct, calcified uterine fibroid 2. Biliary obstruction secondary to #1, status post placement of an internal/external biliary drain and dilatation of malignant bile duct  stricture on 11/08/2019 3. Gastric outlet obstruction, malignant appearing stricture noted on upper endoscopy 05/11/2019-biopsy negative for malignancy  Gastrojejunostomy and gastrojejunal feeding tube placement 05/06/2019, feeding tube subsequently removed  4.  Diet-controlled diabetes 5.  History of hyperparathyroidism, status post parathyroidectomy 6.  Hypertension 7.  History of melanoma of the back 8.  Anorexia/weight loss secondary to #1 9.  Neuropathy  Disposition: Ms. [name] performance status has improved.  We again discussed options including supportive care versus a trial of chemotherapy.  Dr. Benay Spice recommends single agent gemcitabine if she would like to pursue chemotherapy.  We reviewed potential toxicities including bone marrow toxicity, nausea, hair loss, rash, fever, pneumonitis.  She understands she would need a Port-A-Cath.  For now she would like to continue observation, working on activity level and nutrition.  She will return for lab and follow-up in approximately 2 weeks.  Patient seen with Dr. Benay Spice.    Ned Card ANP/GNP-BC   12/23/2019  1:08 PM This was a shared visit with Ned Card.  [name] has an improved performance status.  She is scheduled to undergo an attempt at internalization of the biliary drain tomorrow.  We discussed options of supportive care versus a trial of single agent gemcitabine.  We discussed the expected benefit of gemcitabine.  The plan is to continue observation for now.  Julieanne Manson, MD

## 2019-12-24 ENCOUNTER — Other Ambulatory Visit (HOSPITAL_COMMUNITY): Payer: Self-pay | Admitting: Interventional Radiology

## 2019-12-24 ENCOUNTER — Encounter (HOSPITAL_COMMUNITY): Payer: Self-pay

## 2019-12-24 ENCOUNTER — Ambulatory Visit (HOSPITAL_COMMUNITY)
Admission: RE | Admit: 2019-12-24 | Discharge: 2019-12-24 | Disposition: A | Discharge status: Home / Self Care | Payer: Medicare PPO | Source: Ambulatory Visit | Attending: Interventional Radiology | Admitting: Interventional Radiology

## 2019-12-24 ENCOUNTER — Other Ambulatory Visit: Payer: Self-pay

## 2019-12-24 DIAGNOSIS — G4733 Obstructive sleep apnea (adult) (pediatric): Secondary | ICD-10-CM | POA: Diagnosis not present

## 2019-12-24 DIAGNOSIS — E785 Hyperlipidemia, unspecified: Secondary | ICD-10-CM | POA: Diagnosis not present

## 2019-12-24 DIAGNOSIS — F329 Major depressive disorder, single episode, unspecified: Secondary | ICD-10-CM | POA: Diagnosis not present

## 2019-12-24 DIAGNOSIS — E119 Type 2 diabetes mellitus without complications: Secondary | ICD-10-CM | POA: Diagnosis not present

## 2019-12-24 DIAGNOSIS — E079 Disorder of thyroid, unspecified: Secondary | ICD-10-CM | POA: Diagnosis not present

## 2019-12-24 DIAGNOSIS — E213 Hyperparathyroidism, unspecified: Secondary | ICD-10-CM | POA: Insufficient documentation

## 2019-12-24 DIAGNOSIS — Z79890 Hormone replacement therapy (postmenopausal): Secondary | ICD-10-CM | POA: Diagnosis not present

## 2019-12-24 DIAGNOSIS — Z79899 Other long term (current) drug therapy: Secondary | ICD-10-CM | POA: Insufficient documentation

## 2019-12-24 DIAGNOSIS — Z87891 Personal history of nicotine dependence: Secondary | ICD-10-CM | POA: Diagnosis not present

## 2019-12-24 DIAGNOSIS — I10 Essential (primary) hypertension: Secondary | ICD-10-CM | POA: Insufficient documentation

## 2019-12-24 DIAGNOSIS — C259 Malignant neoplasm of pancreas, unspecified: Secondary | ICD-10-CM | POA: Insufficient documentation

## 2019-12-24 DIAGNOSIS — C801 Malignant (primary) neoplasm, unspecified: Secondary | ICD-10-CM

## 2019-12-24 DIAGNOSIS — K219 Gastro-esophageal reflux disease without esophagitis: Secondary | ICD-10-CM | POA: Insufficient documentation

## 2019-12-24 DIAGNOSIS — K831 Obstruction of bile duct: Secondary | ICD-10-CM | POA: Insufficient documentation

## 2019-12-24 DIAGNOSIS — F419 Anxiety disorder, unspecified: Secondary | ICD-10-CM | POA: Diagnosis not present

## 2019-12-24 HISTORY — PX: IR EXCHANGE BILIARY DRAIN: IMG6046

## 2019-12-24 HISTORY — PX: IR BILIARY STENT(S) EXISTING ACCESS INC DILATION CATH EXCHANGE: IMG6048

## 2019-12-24 HISTORY — PX: IR BALLOON DILATION OF BILIARY DUCTS/AMPULLA: IMG6052

## 2019-12-24 MED ORDER — LIDOCAINE HCL 1 % IJ SOLN
INTRAMUSCULAR | Status: AC
  Filled 2019-12-24: qty 20

## 2019-12-24 MED ORDER — FENTANYL CITRATE (PF) 100 MCG/2ML IJ SOLN
INTRAMUSCULAR | Status: AC | PRN
  Administered 2019-12-24: 50 ug via INTRAVENOUS

## 2019-12-24 MED ORDER — PIPERACILLIN-TAZOBACTAM 3.375 G IVPB 30 MIN
3.3750 g | Freq: Once | INTRAVENOUS | Status: AC
  Filled 2019-12-24: qty 50

## 2019-12-24 MED ORDER — MIDAZOLAM HCL 2 MG/2ML IJ SOLN
INTRAMUSCULAR | Status: AC
  Filled 2019-12-24: qty 6

## 2019-12-24 MED ORDER — PIPERACILLIN-TAZOBACTAM 3.375 G IVPB
INTRAVENOUS | Status: AC
  Administered 2019-12-24: 12:00:00 3.375 g via INTRAVENOUS
  Filled 2019-12-24: qty 50

## 2019-12-24 MED ORDER — HYDROMORPHONE HCL 1 MG/ML IJ SOLN
INTRAMUSCULAR | Status: AC | PRN
  Administered 2019-12-24: 0.5 mg via INTRAVENOUS
  Administered 2019-12-24 (×2): 1 mg via INTRAVENOUS

## 2019-12-24 MED ORDER — CEFAZOLIN SODIUM-DEXTROSE 2-4 GM/100ML-% IV SOLN: 2.0000 g | Freq: Once | INTRAVENOUS | Status: DC

## 2019-12-24 MED ORDER — IOHEXOL 300 MG/ML  SOLN
50.0000 mL | Freq: Once | INTRAMUSCULAR | Status: AC | PRN
  Administered 2019-12-24: 40 mL

## 2019-12-24 MED ORDER — LIDOCAINE HCL (PF) 1 % IJ SOLN
INTRAMUSCULAR | Status: AC | PRN
  Administered 2019-12-24: 10 mL via INTRADERMAL

## 2019-12-24 MED ORDER — HYDROMORPHONE HCL 1 MG/ML IJ SOLN
INTRAMUSCULAR | Status: AC
  Filled 2019-12-24: qty 3

## 2019-12-24 MED ORDER — SODIUM CHLORIDE 0.9 % IV SOLN: INTRAVENOUS | Status: DC

## 2019-12-24 MED ORDER — MIDAZOLAM HCL 2 MG/2ML IJ SOLN
INTRAMUSCULAR | Status: AC | PRN
  Administered 2019-12-24 (×2): 1 mg via INTRAVENOUS
  Administered 2019-12-24: 2 mg via INTRAVENOUS
  Administered 2019-12-24: 1 mg via INTRAVENOUS

## 2019-12-24 NOTE — Discharge Instructions (Addendum)
Please call Interventional Radiology clinic [phone] with any questions or concerns about your drain.  Moderate Conscious Sedation, Adult, Care After These instructions provide you with information about caring for yourself after your procedure. Your health care provider may also give you more specific instructions. Your treatment has been planned according to current medical practices, but problems sometimes occur. Call your health care provider if you have any problems or questions after your procedure. What can I expect after the procedure? After your procedure, it is common:  To feel sleepy for several hours.  To feel clumsy and have poor balance for several hours.  To have poor judgment for several hours.  To vomit if you eat too soon. Follow these instructions at home: For at least 24 hours after the procedure:   Do not: ? Participate in activities where you could fall or become injured. ? Drive. ? Use heavy machinery. ? Drink alcohol. ? Take sleeping pills or medicines that cause drowsiness. ? Make important decisions or sign legal documents. ? Take care of children on your own.  Rest. Eating and drinking  Follow the diet recommended by your health care provider.  If you vomit: ? Drink water, juice, or soup when you can drink without vomiting. ? Make sure you have little or no nausea before eating solid foods. General instructions  Have a responsible adult stay with you until you are awake and alert.  Take over-the-counter and prescription medicines only as told by your health care provider.  If you smoke, do not smoke without supervision.  Keep all follow-up visits as told by your health care provider. This is important. Contact a health care provider if:  You keep feeling nauseous or you keep vomiting.  You feel light-headed.  You develop a rash.  You have a fever. Get help right away if:  You have trouble breathing. This information is not  intended to replace advice given to you by your health care provider. Make sure you discuss any questions you have with your health care provider. Document Revised: 08/31/2017 Document Reviewed: 01/08/2016 Elsevier Patient Education  2020 [place] Drainage Catheter Placement, Care After This sheet gives you information about how to care for yourself after your procedure. Your health care provider may also give you more specific instructions. If you have problems or questions, contact your health care provider. What can I expect after the procedure?    Leave drain capped as directed.   After the procedure, it is common to have:  Pain or soreness at the catheter insertion site.  Tiredness and sleepiness for several hours.  Some bruising at the catheter insertion site. Follow these instructions at home: Medicines   Take over-the-counter and prescription medicines for pain, discomfort, or fever only as told by your health care provider.  Do not take aspirin or blood thinners unless your health care provider says that you can. These can make bleeding worse.  Do not drive or use heavy machinery while taking prescription pain medicine. Catheter insertion site care  Clean the catheter insertion site as told by your health care provider.  Change dressing daily.  Do not take baths, swim, or use a hot tub until your health care provider approves.  Take showers only. Before showering, cover the catheter insertion area with a watertight covering to keep the area dry.  Keep the skin around the catheter insertion site dry. If the area gets wet, dry the skin completely.  Check your catheter insertion site  every day for signs of infection. Check for: ? Redness, swelling, or pain. ? Fluid or blood. ? Warmth. ? Pus or a bad smell. General instructions   Rest for the remainder of the day.  Do not drive, use machinery, or make legal decisions for 24 hours after your  procedure.  Resume your usual diet. Avoid alcoholic beverages for 24 hours after your procedure.  Keep all follow-up visits as told by your health care provider. This is important.  Drink enough fluid to keep your urine clear or pale yellow. Contact a health care provider if:  Your pain gets worse after it had improved, and it is not relieved with pain medicines.  You have any questions about caring for your drainage catheter or collection bag.  You have any of these around your catheter insertion site or coming from it: ? Skin breakdown. ? Redness, swelling, or pain. ? Fluid or blood. ? Warmth to the touch. ? Pus or a bad smell. Get help right away if:  You have a fever or chills.  Your redness, swelling, or pain at the catheter insertion site gets worse, even though you are cleaning it well.  You have leakage of bile around the drainage catheter.  Your drainage catheter becomes blocked or clogged.  Your drainage catheter comes out. This information is not intended to replace advice given to you by your health care provider. Make sure you discuss any questions you have with your health care provider. Document Revised: 08/31/2017 Document Reviewed: 08/07/2016 Elsevier Patient Education  2020 [employer].

## 2019-12-24 NOTE — Consult Note (Signed)
Chief Complaint: Pancreatic adenocarcinoma with biliary obstruction  Referring Physician(s): Dr. Steffanie Dunn  Supervising Physician: Corrie Mckusick  Patient Status: [facility] - Out-pt  History of Present Illness: [name] is a 85 y.o. female   History of pancreatic adenocarcinoma with positive brushings, bilary obstruction with dilation not compressible with ERCP and jaundice.Patient presents for biliary drain stent placement possible exchange possible dilation. Team is discussing one agent chemotherapy at this time.   Past Medical History:  Diagnosis Date  . Allergy   . Anemia   . Anxiety   . Bronchiectasis ([place]) 10/01/2015  . Cataract    removed both eyes  . Depression   . Diabetes mellitus without complication ([place])    diet controlled DM- no meds   . Diverticulosis of colon (without mention of hemorrhage) 07/17/2007  . Gastritis 07/17/2007  . GERD (gastroesophageal reflux disease)   . Glaucoma   . History of fatty infiltration of liver   . Hyperlipemia   . Hyperparathyroidism ([place])   . Hyperparathyroidism, primary ([place]) 01/01/2017  . Hypertension    under control   . Jaundice 11/04/2019  . Lumbosacral root lesions, not elsewhere classified 04/13/2014  . Obese   . Osteoarthritis   . Osteoporosis   . PONV (postoperative nausea and vomiting)    Not in last several surgeries  . Renal calculus, right    severe  . Sleep apnea    uses cpap at night   . Thyroid disease     Past Surgical History:  Procedure Laterality Date  . APPENDECTOMY  1974  . BIOPSY  05/03/2019   Procedure: BIOPSY;  Surgeon: Rush Landmark Telford Nab., MD;  Location: [facility];  Service: Gastroenterology;;  . BIOPSY  11/06/2019   Procedure: BIOPSY;  Surgeon: Irving Copas., MD;  Location: [place];  Service: Gastroenterology;;  . [place]   LEFT--BENIGN   . CESAREAN SECTION  1974  . CESAREAN SECTION  1971  . CHOLECYSTECTOMY  2000  . COLONOSCOPY  2008  .  ESOPHAGOGASTRODUODENOSCOPY  2008  . ESOPHAGOGASTRODUODENOSCOPY (EGD) WITH PROPOFOL N/A 05/03/2019   Procedure: ESOPHAGOGASTRODUODENOSCOPY (EGD) WITH PROPOFOL;  Surgeon: Rush Landmark Telford Nab., MD;  Location: [facility];  Service: Gastroenterology;  Laterality: N/A;  . ESOPHAGOGASTRODUODENOSCOPY (EGD) WITH PROPOFOL N/A 11/06/2019   Procedure: ESOPHAGOGASTRODUODENOSCOPY (EGD) WITH PROPOFOL;  Surgeon: Rush Landmark Telford Nab., MD;  Location: [place];  Service: Gastroenterology;  Laterality: N/A;  . EYE SURGERY     Lasik, Cataract, Hx of Glaucoma  . FINE NEEDLE ASPIRATION  11/06/2019   Procedure: FINE NEEDLE ASPIRATION (FNA) LINEAR;  Surgeon: Irving Copas., MD;  Location: [place];  Service: Gastroenterology;;  . Johann Capers N/A 05/06/2019   Procedure: LAPAROSCOPIC GASTROJEJUNOSTOMY and feedling gastrojejunostomy tube;  Surgeon: Michael Boston, MD;  Location: [facility];  Service: General;  Laterality: N/A;  . IR BALLOON DILATION OF BILIARY DUCTS/AMPULLA  11/08/2019  . IR BILIARY DRAIN PLACEMENT WITH CHOLANGIOGRAM  11/06/2019  . IR GJ TUBE CHANGE  06/03/2019  . IR INT EXT BILIARY DRAIN WITH CHOLANGIOGRAM  11/08/2019  . IR PATIENT EVAL TECH 0-60 MINS  12/18/2019  . KNEE ARTHROPLASTY  2004   RIGHT-TOTAL   . LUMBAR FUSION  2008  . PARATHYROIDECTOMY  2009   X 2--RIGHT INFERIOR AND LEFT SUPERIOR  . PARATHYROIDECTOMY N/A 01/04/2017   Procedure: RIGHT NECK EXPLORATION WITH PARATHYROIDECTOMY;  Surgeon: Armandina Gemma, MD;  Location: [facility];  Service: General;  Laterality: N/A;  . ROTATOR CUFF REPAIR  2000   RIGHT  .  SUBMUCOSAL TATTOO INJECTION  11/06/2019   Procedure: SUBMUCOSAL TATTOO INJECTION;  Surgeon: Irving Copas., MD;  Location: [place];  Service: Gastroenterology;;  . TUBAL LIGATION  1974  . UPPER ESOPHAGEAL ENDOSCOPIC ULTRASOUND (EUS) N/A 11/06/2019   Procedure: UPPER ESOPHAGEAL ENDOSCOPIC ULTRASOUND (EUS);  Surgeon: Irving Copas., MD;  Location: [place];  Service:  Gastroenterology;  Laterality: N/A;  . UPPER GASTROINTESTINAL ENDOSCOPY      Allergies: Codeine, Contrast media [iodinated diagnostic agents], Iohexol, Aspirin, and Fentanyl  Medications: Prior to Admission medications   Medication Sig Start Date End Date Taking? Authorizing Provider  albuterol (VENTOLIN HFA) 108 (90 Base) MCG/ACT inhaler Inhale 2 puffs into the lungs every 6 (six) hours as needed for cough or shortness of breath. 10/07/19  Yes [provider]  benzonatate (TESSALON) 100 MG capsule Take 100 mg by mouth 3 (three) times daily as needed for cough.  06/18/19  Yes [provider]  dorzolamide (TRUSOPT) 2 % ophthalmic solution Place 1 drop into both eyes 2 (two) times daily.  11/15/15  Yes [provider]  ROCKLATAN 0.02-0.005 % SOLN Place 1 drop into both eyes at bedtime. 01/08/19  Yes [provider]  simethicone (MYLICON) 80 MG chewable tablet Chew 1 tablet (80 mg total) by mouth every 4 (four) hours as needed for flatulence (also available OTC). 05/19/19  Yes Rai, Ripudeep Raliegh Ip, MD  famotidine (PEPCID) 20 MG tablet  12/19/19   [provider]  fluticasone (FLONASE) 50 MCG/ACT nasal spray Place 2 sprays into both nostrils daily. Patient taking differently: Place 2 sprays into both nostrils at bedtime as needed for allergies.  09/01/15   Skeet Latch, MD  furosemide (LASIX) 20 MG tablet Take 20 mg by mouth as needed for fluid. Mon, Wed, Fri 07/27/19   [provider]  gabapentin (NEURONTIN) 100 MG capsule Take 100 mg by mouth 3 (three) times daily. 10/16/19   [provider]  levothyroxine (SYNTHROID) 50 MCG tablet Take 50 mcg by mouth daily. 12/18/18   [provider]  mirtazapine (REMERON) 15 MG tablet Take 15 mg by mouth at bedtime. 07/27/19   [provider]  Morphine Sulfate (MORPHINE CONCENTRATE) 10 mg / 0.5 ml concentrated solution 1 drop sublingual as needed 11/24/19   [provider]   Multiple Vitamins-Minerals (PRESERVISION AREDS 2+MULTI VIT) CAPS Take 1 capsule by mouth 2 (two) times daily.    [provider]  ondansetron (ZOFRAN) 4 MG tablet Take 1 tablet by mouth every 8 (eight) hours as needed for nausea or vomiting.  05/31/19   [provider]  pantoprazole (PROTONIX) 40 MG tablet Take 40 mg by mouth daily.    [provider]  predniSONE (DELTASONE) 50 MG tablet  12/19/19   [provider]  zoledronic acid (RECLAST) 5 MG/100ML SOLN injection Inject 5 mg into the vein yearly. [facility] June 2015)    [provider]     Family History  Problem Relation Age of Onset  . Colon polyps Mother   . Hypertension Mother   . Osteoarthritis Mother   . Diabetes Father   . Stroke Father   . Hernia Father        Esophageal hernia  . Osteoarthritis Maternal Grandmother   . Stroke Maternal Grandfather   . Liver disease Paternal Grandmother   . Stroke Paternal Grandfather   . Colon cancer Neg Hx   . Esophageal cancer Neg Hx   . Rectal cancer Neg Hx   . Stomach cancer Neg  Hx     Social History   Socioeconomic History  . Marital status: Married    Spouse name: Not on file  . Number of children: 2  . Years of education: Not on file  . Highest education level: Not on file  Occupational History  . Occupation: retired  Tobacco Use  . Smoking status: Former Smoker    Packs/day: 0.25    Years: 15.00    Pack years: 3.75    Types: Cigarettes    Quit date: 09/01/2014    Years since quitting: 5.3  . Smokeless tobacco: Never Used  Substance and Sexual Activity  . Alcohol use: Yes    Alcohol/week: 1.0 standard drinks    Types: 1 Glasses of wine per week    Comment: occasional  . Drug use: No  . Sexual activity: Not on file  Other Topics Concern  . Not on file  Social History Narrative   Married, with 2 children   Right handed   Some college   No caffeine      Epworth Sleepiness Scale = 7 (as of 07/21/2015)      Social  Determinants of Health   Financial Resource Strain:   . Difficulty of Paying Living Expenses:   Food Insecurity:   . Worried About Charity fundraiser in the Last Year:   . Arboriculturist in the Last Year:   Transportation Needs:   . Film/video editor (Medical):   Marland Kitchen Lack of Transportation (Non-Medical):   Physical Activity:   . Days of Exercise per Week:   . Minutes of Exercise per Session:   Stress:   . Feeling of Stress :   Social Connections:   . Frequency of Communication with Friends and Family:   . Frequency of Social Gatherings with Friends and Family:   . Attends Religious Services:   . Active Member of Clubs or Organizations:   . Attends Archivist Meetings:   Marland Kitchen Marital Status:      Review of Systems: A 12 point ROS discussed and pertinent positives are indicated in the HPI above.  All other systems are negative.  Review of Systems  Constitutional: Negative for fatigue and fever.  HENT: Negative for congestion.   Respiratory: Negative for cough and shortness of breath.   Gastrointestinal: Positive for nausea ( resolved earlier with zofron prior to arrival). Negative for abdominal pain, diarrhea and vomiting.    Vital Signs: BP 128/90 (BP Location: Left Arm)   Pulse 87   Temp 98.2 F (36.8 C) (Oral)   Resp 18   SpO2 100%   Physical Exam Vitals and nursing note reviewed.  Constitutional:      Appearance: She is well-developed.  HENT:     Head: Normocephalic and atraumatic.  Eyes:     Conjunctiva/sclera: Conjunctivae normal.  Cardiovascular:     Rate and Rhythm: Normal rate and regular rhythm.  Pulmonary:     Effort: Pulmonary effort is normal.     Breath sounds: Normal breath sounds.  Abdominal:     Comments: Internal external biliary drain present capped.   Musculoskeletal:        General: Normal range of motion.     Cervical back: Normal range of motion.  Skin:    General: Skin is warm.  Neurological:     Mental Status: She is  alert and oriented to person, place, and time.     Imaging: IR PATIENT EVAL TECH 0-60 MINS  Result  Date: 12/18/2019 Rosann Auerbach, RT     12/18/2019 10:39 AM Patient arrived for drain change. Unable to complete procedure due to patient not being pretreated for contrast allergy.  MD spoke with patient about scheduling for next week.   Labs:  CBC: Recent Labs    11/04/19 1055 11/05/19 0249 11/07/19 0231 11/10/19 0506  WBC 5.7 4.8 8.5 9.8  HGB 11.0* 9.8* 10.3* 9.9*  HCT 35.0* 30.4* 31.5* 30.5*  PLT 345 208 335 245    COAGS: Recent Labs    11/03/19 1432 11/04/19 1055  INR 1.4* 1.2    BMP: Recent Labs    11/08/19 0409 11/09/19 0309 11/10/19 0506 11/11/19 0144  NA 139 138 139 138  K 4.6 5.2* 4.9 3.6  CL 113* 110 114* 109  CO2 20* 19* 19* 22  GLUCOSE 158* 139* 91 94  BUN 19 25* 23 17  CALCIUM 9.8 9.8 9.5 9.5  CREATININE 1.22* 1.24* 1.07* 1.02*  GFRNONAA 41* 40* 48* 51*  GFRAA 48* 47* 56* 59*    LIVER FUNCTION TESTS: Recent Labs    11/08/19 0409 11/09/19 0309 11/10/19 0506 11/11/19 0144  BILITOT 2.4* 2.8* 2.7* 1.8*  AST 60* 64* 42* 19  ALT 65* 52* 39 33  ALKPHOS 810* 821* 548* 464*  PROT 4.6* 4.9* 4.1* 4.2*  ALBUMIN 2.0* 2.2* 1.9* 1.8*    TUMOR MARKERS: No results for input(s): AFPTM, CEA, CA199, CHROMGRNA in the last 8760 hours.  Assessment and Plan: 85 y.o, female outpatient. History of pancreatic adenocarcinoma with positive brushings, bilary obstruction with dilation not compressible with ERCP and jaundice.Patient presents for biliary drain stent placement possible exchange possible dilation. Team is discussing one agent chemotherapy at this time.  Pertinent Imaging None since biliary drain exchange and upsize.  Pertinent IR History 2.6.21 - IR biliary drain exchnage with dilation - 12 Fr 2.4.21 - Biliary drain placement 9.1.20 - GJ tube exchange  Pertinent Allergies contrast dye reaction hives Fentanyl reaction AMS Codeine    All  labs and medications are within acceptable parameters.  Patient is afebrile.  Patient has been medicated for contrast allergy  Thank you for this interesting consult.  I greatly enjoyed meeting [name] and look forward to participating in their care.  A copy of this report was sent to the requesting provider on this date.  Electronically Signed: Avel Peace, NP 12/24/2019, 11:35 AM   I spent a total of  25 Minutes in face to face in clinical consultation, greater than 50% of which was counseling/coordinating care for biliary drain exhcnage possible upsize with dilation possible biliary stent.

## 2019-12-24 NOTE — Procedures (Signed)
Interventional Radiology Procedure Note  Procedure:   Cholangiogram Stent and angioplasty of malignant stricture of the CBD.  27mm x 90mm stent, with 8mm PTA.  Exchange of int/ext biliary drain with new 12 F drain.  Capped   Complications: None  Recommendations:  - 2 hour observation - advance diet - Follow up in 1-2 weeks in VIR for a fluoro guided removal vs exchange of the drain. CMP needed at that visit - Do not submerge - Routine drain care   Signed,  Dulcy Fanny. Earleen Newport, DO

## 2019-12-25 ENCOUNTER — Ambulatory Visit: Payer: Medicare PPO | Attending: Internal Medicine

## 2019-12-25 DIAGNOSIS — N281 Cyst of kidney, acquired: Secondary | ICD-10-CM | POA: Diagnosis not present

## 2019-12-25 DIAGNOSIS — G893 Neoplasm related pain (acute) (chronic): Secondary | ICD-10-CM | POA: Diagnosis not present

## 2019-12-25 DIAGNOSIS — K3189 Other diseases of stomach and duodenum: Secondary | ICD-10-CM | POA: Diagnosis not present

## 2019-12-25 DIAGNOSIS — C250 Malignant neoplasm of head of pancreas: Secondary | ICD-10-CM | POA: Diagnosis not present

## 2019-12-25 DIAGNOSIS — N200 Calculus of kidney: Secondary | ICD-10-CM | POA: Diagnosis not present

## 2019-12-25 DIAGNOSIS — E1122 Type 2 diabetes mellitus with diabetic chronic kidney disease: Secondary | ICD-10-CM | POA: Diagnosis not present

## 2019-12-25 DIAGNOSIS — J90 Pleural effusion, not elsewhere classified: Secondary | ICD-10-CM | POA: Diagnosis not present

## 2019-12-25 DIAGNOSIS — K9189 Other postprocedural complications and disorders of digestive system: Secondary | ICD-10-CM | POA: Diagnosis not present

## 2019-12-25 DIAGNOSIS — I129 Hypertensive chronic kidney disease with stage 1 through stage 4 chronic kidney disease, or unspecified chronic kidney disease: Secondary | ICD-10-CM | POA: Diagnosis not present

## 2019-12-25 DIAGNOSIS — Z23 Encounter for immunization: Secondary | ICD-10-CM

## 2019-12-25 NOTE — Progress Notes (Signed)
   Covid-19 Vaccination Clinic  Name:  MITHILA RIOLO    MRN: [id number] DOB: Jul 02, 1935  12/25/2019  Ms. Persaud was observed post Covid-19 immunization for 15 minutes without incident. She was provided with Vaccine Information Sheet and instruction to access the V-Safe system.   Ms. Hilder was instructed to call 911 with any severe reactions post vaccine: Marland Kitchen Difficulty breathing  . Swelling of face and throat  . A fast heartbeat  . A bad rash all over body  . Dizziness and weakness   Immunizations Administered    Name Date Dose VIS Date Route   Pfizer COVID-19 Vaccine 12/25/2019  1:46 PM 0.3 mL 09/12/2019 Intramuscular   Manufacturer: [place]   Lot: [id number]   [place]: [id number]

## 2019-12-30 NOTE — Progress Notes (Signed)
Patient's husband Dylilah Staser calls stating the onset a couple of days ago patient c/o increased pain in upper abdominal area, some shivering, fatigue and weakness.  Had biliary drain exchanged on 3/24.  He denies any fevers, no N/V/D, no congestion, bowels are moving, drinking adequately and eating some.  Consulted with Dr. Benay Spice and per his directions advised Mr. Ervin that if patient starts developing fever to take to [facility] ED for evaluation.  He will give her pain medication to see if this eases her pain.  I advised him to call back with any other questions or concerns.  He verbalized an understanding.

## 2020-01-01 ENCOUNTER — Other Ambulatory Visit (HOSPITAL_COMMUNITY): Payer: Self-pay | Admitting: Interventional Radiology

## 2020-01-01 ENCOUNTER — Ambulatory Visit (HOSPITAL_COMMUNITY)
Admission: RE | Admit: 2020-01-01 | Discharge: 2020-01-01 | Disposition: A | Discharge status: Home / Self Care | Payer: Medicare PPO | Source: Ambulatory Visit | Attending: Interventional Radiology | Admitting: Interventional Radiology

## 2020-01-01 ENCOUNTER — Other Ambulatory Visit: Payer: Self-pay

## 2020-01-01 DIAGNOSIS — C801 Malignant (primary) neoplasm, unspecified: Secondary | ICD-10-CM

## 2020-01-01 DIAGNOSIS — K831 Obstruction of bile duct: Secondary | ICD-10-CM | POA: Diagnosis not present

## 2020-01-01 DIAGNOSIS — Z4803 Encounter for change or removal of drains: Secondary | ICD-10-CM | POA: Diagnosis not present

## 2020-01-01 HISTORY — PX: IR REMOVAL BILIARY DRAIN: IMG6047

## 2020-01-01 MED ORDER — IOHEXOL 300 MG/ML  SOLN
50.0000 mL | Freq: Once | INTRAMUSCULAR | Status: AC | PRN
  Administered 2020-01-01: 7 mL

## 2020-01-02 DIAGNOSIS — E1122 Type 2 diabetes mellitus with diabetic chronic kidney disease: Secondary | ICD-10-CM | POA: Diagnosis not present

## 2020-01-02 DIAGNOSIS — N281 Cyst of kidney, acquired: Secondary | ICD-10-CM | POA: Diagnosis not present

## 2020-01-02 DIAGNOSIS — G893 Neoplasm related pain (acute) (chronic): Secondary | ICD-10-CM | POA: Diagnosis not present

## 2020-01-02 DIAGNOSIS — J90 Pleural effusion, not elsewhere classified: Secondary | ICD-10-CM | POA: Diagnosis not present

## 2020-01-02 DIAGNOSIS — C250 Malignant neoplasm of head of pancreas: Secondary | ICD-10-CM | POA: Diagnosis not present

## 2020-01-02 DIAGNOSIS — I129 Hypertensive chronic kidney disease with stage 1 through stage 4 chronic kidney disease, or unspecified chronic kidney disease: Secondary | ICD-10-CM | POA: Diagnosis not present

## 2020-01-02 DIAGNOSIS — K3189 Other diseases of stomach and duodenum: Secondary | ICD-10-CM | POA: Diagnosis not present

## 2020-01-02 DIAGNOSIS — N200 Calculus of kidney: Secondary | ICD-10-CM | POA: Diagnosis not present

## 2020-01-02 DIAGNOSIS — K9189 Other postprocedural complications and disorders of digestive system: Secondary | ICD-10-CM | POA: Diagnosis not present

## 2020-01-05 ENCOUNTER — Other Ambulatory Visit: Payer: Self-pay

## 2020-01-05 DIAGNOSIS — C259 Malignant neoplasm of pancreas, unspecified: Secondary | ICD-10-CM

## 2020-01-05 NOTE — Progress Notes (Signed)
Patient's husband [name] calls stating that he spoke with Dr. Benay Spice Friday evening and he recommended Hospice services.  The family spoke with patient over the weekend and they would like to proceed.    Verified with Dr. Benay Spice, placed order for referral to hospice services.  Faxed demographics, insurance card, OV notes and referral to [facility] in [place].

## 2020-01-06 ENCOUNTER — Telehealth: Payer: Self-pay | Admitting: *Deleted

## 2020-01-06 NOTE — Telephone Encounter (Signed)
[facility] called to say they have been in touch with [name]. Plan to do Admission visit after her appt with Dr Benay Spice on Thursday. Wanted to wait to see if she decides to continue with treatment. Plan to see her Friday AM

## 2020-01-07 DIAGNOSIS — E1122 Type 2 diabetes mellitus with diabetic chronic kidney disease: Secondary | ICD-10-CM | POA: Diagnosis not present

## 2020-01-07 DIAGNOSIS — K3189 Other diseases of stomach and duodenum: Secondary | ICD-10-CM | POA: Diagnosis not present

## 2020-01-07 DIAGNOSIS — K9189 Other postprocedural complications and disorders of digestive system: Secondary | ICD-10-CM | POA: Diagnosis not present

## 2020-01-07 DIAGNOSIS — J90 Pleural effusion, not elsewhere classified: Secondary | ICD-10-CM | POA: Diagnosis not present

## 2020-01-07 DIAGNOSIS — N200 Calculus of kidney: Secondary | ICD-10-CM | POA: Diagnosis not present

## 2020-01-07 DIAGNOSIS — N281 Cyst of kidney, acquired: Secondary | ICD-10-CM | POA: Diagnosis not present

## 2020-01-07 DIAGNOSIS — C250 Malignant neoplasm of head of pancreas: Secondary | ICD-10-CM | POA: Diagnosis not present

## 2020-01-07 DIAGNOSIS — I129 Hypertensive chronic kidney disease with stage 1 through stage 4 chronic kidney disease, or unspecified chronic kidney disease: Secondary | ICD-10-CM | POA: Diagnosis not present

## 2020-01-07 DIAGNOSIS — G893 Neoplasm related pain (acute) (chronic): Secondary | ICD-10-CM | POA: Diagnosis not present

## 2020-01-08 ENCOUNTER — Encounter: Payer: Self-pay | Admitting: Nurse Practitioner

## 2020-01-08 ENCOUNTER — Telehealth: Payer: Self-pay | Admitting: Nurse Practitioner

## 2020-01-08 ENCOUNTER — Inpatient Hospital Stay: Payer: Medicare PPO

## 2020-01-08 ENCOUNTER — Other Ambulatory Visit: Payer: Self-pay

## 2020-01-08 ENCOUNTER — Inpatient Hospital Stay: Payer: Medicare PPO | Attending: Oncology | Admitting: Nurse Practitioner

## 2020-01-08 VITALS — BP 112/64 | HR 87 | Temp 98.7°F | Resp 17 | Ht 63.0 in | Wt 181.6 lb

## 2020-01-08 DIAGNOSIS — I10 Essential (primary) hypertension: Secondary | ICD-10-CM | POA: Diagnosis not present

## 2020-01-08 DIAGNOSIS — C250 Malignant neoplasm of head of pancreas: Secondary | ICD-10-CM | POA: Diagnosis not present

## 2020-01-08 DIAGNOSIS — R634 Abnormal weight loss: Secondary | ICD-10-CM | POA: Insufficient documentation

## 2020-01-08 DIAGNOSIS — C259 Malignant neoplasm of pancreas, unspecified: Secondary | ICD-10-CM

## 2020-01-08 DIAGNOSIS — G629 Polyneuropathy, unspecified: Secondary | ICD-10-CM | POA: Diagnosis not present

## 2020-01-08 DIAGNOSIS — R630 Anorexia: Secondary | ICD-10-CM | POA: Insufficient documentation

## 2020-01-08 DIAGNOSIS — E119 Type 2 diabetes mellitus without complications: Secondary | ICD-10-CM | POA: Diagnosis not present

## 2020-01-08 DIAGNOSIS — Z85820 Personal history of malignant melanoma of skin: Secondary | ICD-10-CM | POA: Insufficient documentation

## 2020-01-08 LAB — CMP (CANCER CENTER ONLY)
ALT: 12 U/L (ref 0–44)
AST: 13 U/L — ABNORMAL LOW (ref 15–41)
Albumin: 2 g/dL — ABNORMAL LOW (ref 3.5–5.0)
Alkaline Phosphatase: 194 U/L — ABNORMAL HIGH (ref 38–126)
Anion gap: 5 (ref 5–15)
BUN: 10 mg/dL (ref 8–23)
CO2: 28 mmol/L (ref 22–32)
Calcium: 9.9 mg/dL (ref 8.9–10.3)
Chloride: 104 mmol/L (ref 98–111)
Creatinine: 1.35 mg/dL — ABNORMAL HIGH (ref 0.44–1.00)
GFR, Est AFR Am: 42 mL/min — ABNORMAL LOW (ref >60)
GFR, Estimated: 36 mL/min — ABNORMAL LOW (ref >60)
Glucose, Bld: 121 mg/dL — ABNORMAL HIGH (ref 70–99)
Potassium: 4.6 mmol/L (ref 3.5–5.1)
Sodium: 137 mmol/L (ref 135–145)
Total Bilirubin: 0.6 mg/dL (ref 0.3–1.2)
Total Protein: 5.6 g/dL — ABNORMAL LOW (ref 6.5–8.1)

## 2020-01-08 NOTE — Telephone Encounter (Signed)
Scheduled appt per 4/8 los.  Printed avs per pt request

## 2020-01-08 NOTE — Progress Notes (Addendum)
  [facility] OFFICE PROGRESS NOTE   Diagnosis: Pancreas cancer  INTERVAL HISTORY:   [name] returns as scheduled.  Biliary drain was removed on January 01, 2020.  Overall she is feeling some better.  She notes improvement in her energy level.  Appetite continues to be borderline.  She denies pain at present.  She occasionally takes ibuprofen.  No fever.  No nausea.  Objective:  Vital signs in last 24 hours:  Blood pressure 112/64, pulse 87, temperature 98.7 F (37.1 C), temperature source Temporal, resp. rate 17, height 5\' 3"  (1.6 m), weight 181 lb 9.6 oz (82.4 kg), SpO2 99 %.    HEENT: Sclera anicteric. GI: Abdomen soft and nontender.  No hepatomegaly. Vascular: No leg edema. Neuro: Alert and oriented. Skin: Warm and dry.   Lab Results:  Lab Results  Component Value Date   WBC 9.8 11/10/2019   HGB 9.9 (L) 11/10/2019   HCT 30.5 (L) 11/10/2019   MCV 95.6 11/10/2019   PLT 245 11/10/2019   NEUTROABS 8.0 (H) 11/10/2019    Imaging:  No results found.  Medications: I have reviewed the patient's current medications.  Assessment/Plan: 1. Pancreas cancer, pancreas head mass, FNA biopsy 11/06/2019-adenocarcinoma ? EGD/EUS 11/06/2019-pancreas head mass,T2N0, ascites, duodenal stenosis with abnormal tissue present-biopsy revealed a duodenal adenoma, afferernt limb revealed high-grade dysplasia ? MRI/MRCP 11/04/2019-diffuse biliary and pancreatic ductal dilatation, 2 cm lesion in the pancreas head, no evidence of metastatic disease ? CT abdomen/pelvis 11/03/2019-severe intrahepatic biliary dilatation and dilatation of the common bile duct, calcified uterine fibroid 2. Biliary obstruction secondary to #1, status post placement of an internal/external biliary drain and dilatation of malignant bile duct stricture on 11/08/2019; biliary drain removed 01/01/2020. 3. Gastric outlet obstruction, malignant appearing stricture noted on upper endoscopy 05/11/2019-biopsy negative for  malignancy  Gastrojejunostomy and gastrojejunal feeding tube placement 05/06/2019, feeding tube subsequently removed  4. Diet-controlled diabetes 5. History of hyperparathyroidism, status post parathyroidectomy 6. Hypertension 7. History of melanoma of the back 8. Anorexia/weight loss secondary to #1 9.Neuropathy  Disposition: [name] appears stable.  Her performance status continues to improve.  We discussed options of supportive care versus a trial of single agent gemcitabine.  She has decided on a comfort care approach with a hospice referral.  She plans to enroll in the hospice program tomorrow.  We discussed end-of-life issues/CODE STATUS.  She is undecided regarding CODE STATUS.  She will return for a follow-up appointment in 6 weeks.  She will contact the office in the interim with any problems.  Patient seen with Dr. Benay Spice.    Ned Card ANP/GNP-BC   01/08/2020  3:48 PM This was a shared visit with Ned Card.  We discussed treatment options with [name] and her husband.  I discussed the case with her husband and daughter by telephone last week.  [name] has decided against chemotherapy.  She would like to enroll in home hospice care.  Her quality of life has improved and when she would like to preserve this as long as possible.  Julieanne Manson, MD

## 2020-01-09 LAB — CANCER ANTIGEN 19-9: CA 19-9: 179 U/mL — ABNORMAL HIGH (ref 0–35)

## 2020-01-14 ENCOUNTER — Other Ambulatory Visit: Payer: Self-pay | Admitting: *Deleted

## 2020-01-14 MED ORDER — ONDANSETRON HCL 4 MG PO TABS: 4.0000 mg | ORAL_TABLET | Freq: Three times a day (TID) | ORAL | 1 refills | Status: AC | PRN

## 2020-01-14 NOTE — Progress Notes (Signed)
Call from Hospice RN, Sam that patient going out of town and needs refill on Zofran 4 mg. Refill approved.

## 2020-01-19 ENCOUNTER — Ambulatory Visit: Payer: Medicare PPO

## 2020-01-19 ENCOUNTER — Ambulatory Visit: Payer: Medicare PPO | Attending: Internal Medicine

## 2020-01-19 DIAGNOSIS — Z23 Encounter for immunization: Secondary | ICD-10-CM

## 2020-01-19 NOTE — Progress Notes (Signed)
   Covid-19 Vaccination Clinic  Name:  [name]    MRN: [id number] DOB: 1935-08-06  01/19/2020  [name] was observed post Covid-19 immunization for 15 minutes without incident. She was provided with Vaccine Information Sheet and instruction to access the V-Safe system.   [name] was instructed to call 911 with any severe reactions post vaccine: Marland Kitchen Difficulty breathing  . Swelling of face and throat  . A fast heartbeat  . A bad rash all over body  . Dizziness and weakness   Immunizations Administered    Name Date Dose VIS Date Route   Pfizer COVID-19 Vaccine 01/19/2020  4:15 PM 0.3 mL 11/26/2018 Intramuscular   Manufacturer: [place]   Lot: [id number]   [place]: [id number]

## 2020-01-20 ENCOUNTER — Telehealth: Payer: Self-pay | Admitting: *Deleted

## 2020-01-20 NOTE — Telephone Encounter (Signed)
Husband is asking if MD willing to order Cymbalta the antidepressant she had taken in the past?  She seems to not be enjoying her family and is more withdrawn. Review of medication history notes she was on 30 mg daily in past.

## 2020-01-21 ENCOUNTER — Telehealth: Payer: Self-pay | Admitting: *Deleted

## 2020-01-21 MED ORDER — MIRTAZAPINE 30 MG PO TABS: 30.0000 mg | ORAL_TABLET | Freq: Every day | ORAL | 2 refills | Status: AC

## 2020-01-21 NOTE — Telephone Encounter (Signed)
Pharmacist research shows she should not be on Remeron and Cymbalta. Per Dr. Benay Spice, Santa Claus to increase the Remeron to 30 mg nightly. Hospice RN, Manuela Schwartz will relay this to patient/husband.

## 2020-02-24 ENCOUNTER — Other Ambulatory Visit: Payer: Self-pay

## 2020-02-24 ENCOUNTER — Telehealth: Payer: Self-pay | Admitting: Oncology

## 2020-02-24 ENCOUNTER — Inpatient Hospital Stay: Payer: Medicare Other | Attending: Oncology | Admitting: Oncology

## 2020-02-24 VITALS — BP 109/62 | HR 95 | Temp 98.1°F | Resp 19 | Ht 63.0 in | Wt 174.9 lb

## 2020-02-24 DIAGNOSIS — R627 Adult failure to thrive: Secondary | ICD-10-CM | POA: Insufficient documentation

## 2020-02-24 DIAGNOSIS — C259 Malignant neoplasm of pancreas, unspecified: Secondary | ICD-10-CM

## 2020-02-24 DIAGNOSIS — C250 Malignant neoplasm of head of pancreas: Secondary | ICD-10-CM | POA: Diagnosis present

## 2020-02-24 NOTE — Telephone Encounter (Signed)
Scheduled appt per 5/25 los - pt is aware of appt date and time

## 2020-02-24 NOTE — Progress Notes (Signed)
  [facility] OFFICE PROGRESS NOTE   Diagnosis: Pancreas cancer  INTERVAL HISTORY:   [name] returns for a scheduled visit.  She is here with her husband.  The hospice nurse is visiting weekly.  She denies pain.  She had an episode of vomiting this morning after drinking Ensure.  She does not have consistent nausea or vomiting.  She is ambulatory in the home.  She was able to go to the beach recently.  Objective:  Vital signs in last 24 hours:  Blood pressure 109/62, pulse 95, temperature 98.1 F (36.7 C), temperature source Temporal, resp. rate 19, height 5\' 3"  (1.6 m), weight 174 lb 14.4 oz (79.3 kg), SpO2 98 %.    HEENT: Sclera anicteric Resp: Lungs clear bilaterally Cardio: Regular rate and rhythm GI: No hepatomegaly, no mass, nontender Vascular: 1-2+ edema at the lower leg bilaterally   Lab Results:  Lab Results  Component Value Date   WBC 9.8 11/10/2019   HGB 9.9 (L) 11/10/2019   HCT 30.5 (L) 11/10/2019   MCV 95.6 11/10/2019   PLT 245 11/10/2019   NEUTROABS 8.0 (H) 11/10/2019    CMP  Lab Results  Component Value Date   NA 137 01/08/2020   K 4.6 01/08/2020   CL 104 01/08/2020   CO2 28 01/08/2020   GLUCOSE 121 (H) 01/08/2020   BUN 10 01/08/2020   CREATININE 1.35 (H) 01/08/2020   CALCIUM 9.9 01/08/2020   PROT 5.6 (L) 01/08/2020   ALBUMIN 2.0 (L) 01/08/2020   AST 13 (L) 01/08/2020   ALT 12 01/08/2020   ALKPHOS 194 (H) 01/08/2020   BILITOT 0.6 01/08/2020   GFRNONAA 36 (L) 01/08/2020   GFRAA 42 (L) 01/08/2020     Medications: I have reviewed the patient's current medications.   Assessment/Plan: 1. Pancreas cancer, pancreas head mass, FNA biopsy 11/06/2019-adenocarcinoma ? EGD/EUS 11/06/2019-pancreas head mass,T2N0, ascites, duodenal stenosis with abnormal tissue present-biopsy revealed a duodenal adenoma, afferernt limb revealed high-grade dysplasia ? MRI/MRCP 11/04/2019-diffuse biliary and pancreatic ductal dilatation, 2 cm lesion in the  pancreas head, no evidence of metastatic disease ? CT abdomen/pelvis 11/03/2019-severe intrahepatic biliary dilatation and dilatation of the common bile duct, calcified uterine fibroid 2. Biliary obstruction secondary to #1, status post placement of an internal/external biliary drain and dilatation of malignant bile duct stricture on 11/08/2019; biliary drain removed 01/01/2020. 3. Gastric outlet obstruction, malignant appearing stricture noted on upper endoscopy 05/11/2019-biopsy negative for malignancy  Gastrojejunostomy and gastrojejunal feeding tube placement 05/06/2019, feeding tube subsequently removed  4. Diet-controlled diabetes 5. History of hyperparathyroidism, status post parathyroidectomy 6. Hypertension 7. History of melanoma of the back 8. Anorexia/weight loss secondary to #1 9.Neuropathy    Disposition: [name] has locally advanced pancreas cancer.  Her clinical status appears to be slowly declining.  The plan is to continue comfort care measures.  She will continue follow-up with a home hospice team.  [name] will return for an office visit in 6 weeks.  We are available to see her in the interim as needed.  Betsy Coder, MD  02/24/2020  1:05 PM

## 2020-02-26 ENCOUNTER — Telehealth: Payer: Self-pay | Admitting: *Deleted

## 2020-02-26 DIAGNOSIS — C259 Malignant neoplasm of pancreas, unspecified: Secondary | ICD-10-CM

## 2020-02-26 NOTE — Telephone Encounter (Signed)
RN reports drastic change in status over last 48 hours. She is now barely responsive. Had to call EMS last night to get her upstairs into the bed. Husband is asking for DNR if MD feels it is appropriate. OK per Dr. Benay Spice for DNR.

## 2020-03-02 ENCOUNTER — Telehealth: Payer: Self-pay | Admitting: *Deleted

## 2020-03-02 NOTE — Telephone Encounter (Signed)
Patient died a peaceful death in home with family at her side on 02/15/2020. Family expressed appreciation for Dr. Gearldine Shown care.

## 2020-03-02 DEATH — deceased

## 2020-04-08 ENCOUNTER — Ambulatory Visit: Payer: Medicare PPO | Admitting: Oncology

## 2021-03-02 IMAGING — XA IR REPLACE G/J TUBE W/ FLUORO
3 series · 3 of 3 positions shown · non-contrast
Comparison: CT abdomen and pelvis-05/01/2019

INDICATION: History of gastric outlet obstruction, post creation
gastrojejunostomy and surgical placement of a feeding
gastrojejunostomy tube placement on 05/06/2019.

Patient now presents with occlusion of the jejunal lumen of the 30
French balloon retention gastrojejunostomy catheter.
EXAM:
FLUOROSCOPIC GUIDED REPLACEMENT OF GASTROJEJUNOSTOMY TUBE

[Series 2: fl (-) angio · 1 of 1 slices shown (1 of 3)]
[im 1/1]
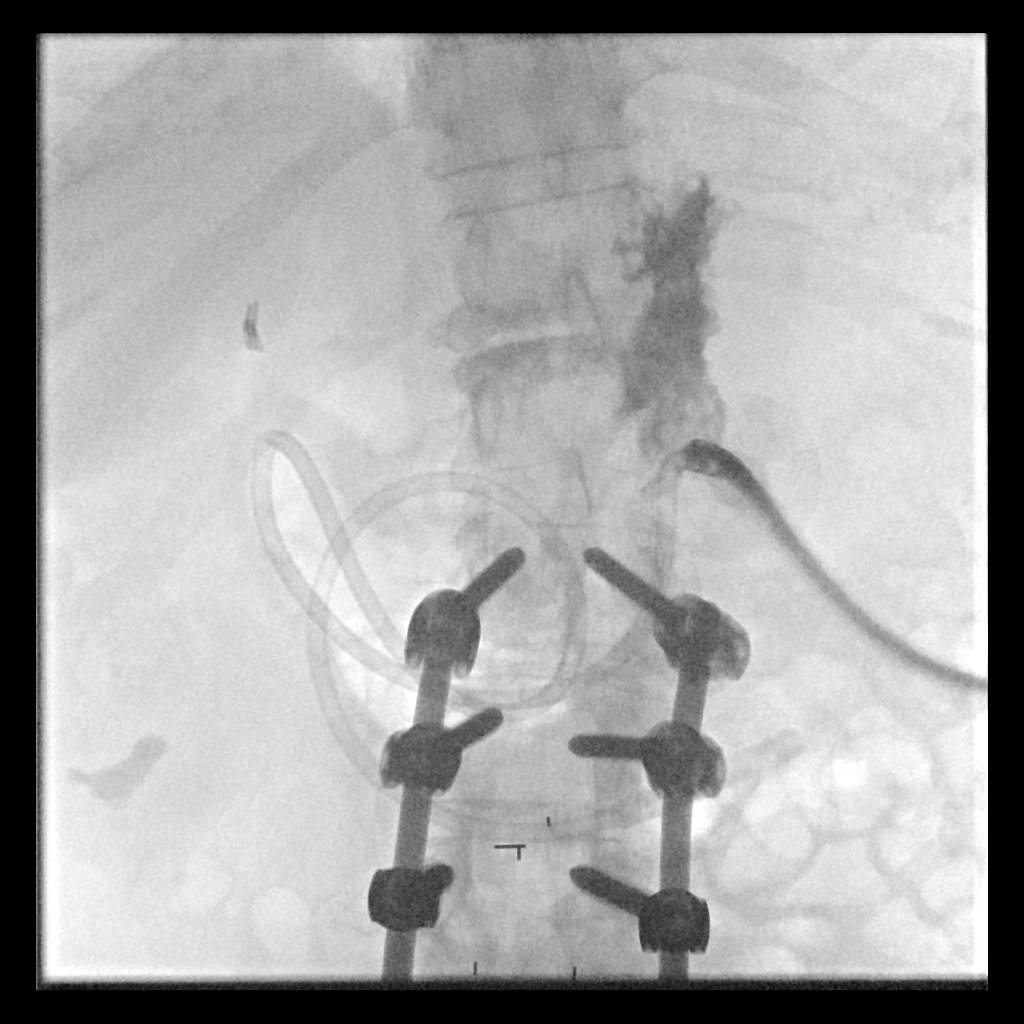

[Series 3: fl (-) angio · 1 of 1 slices shown (2 of 3)]
[im 1/1]
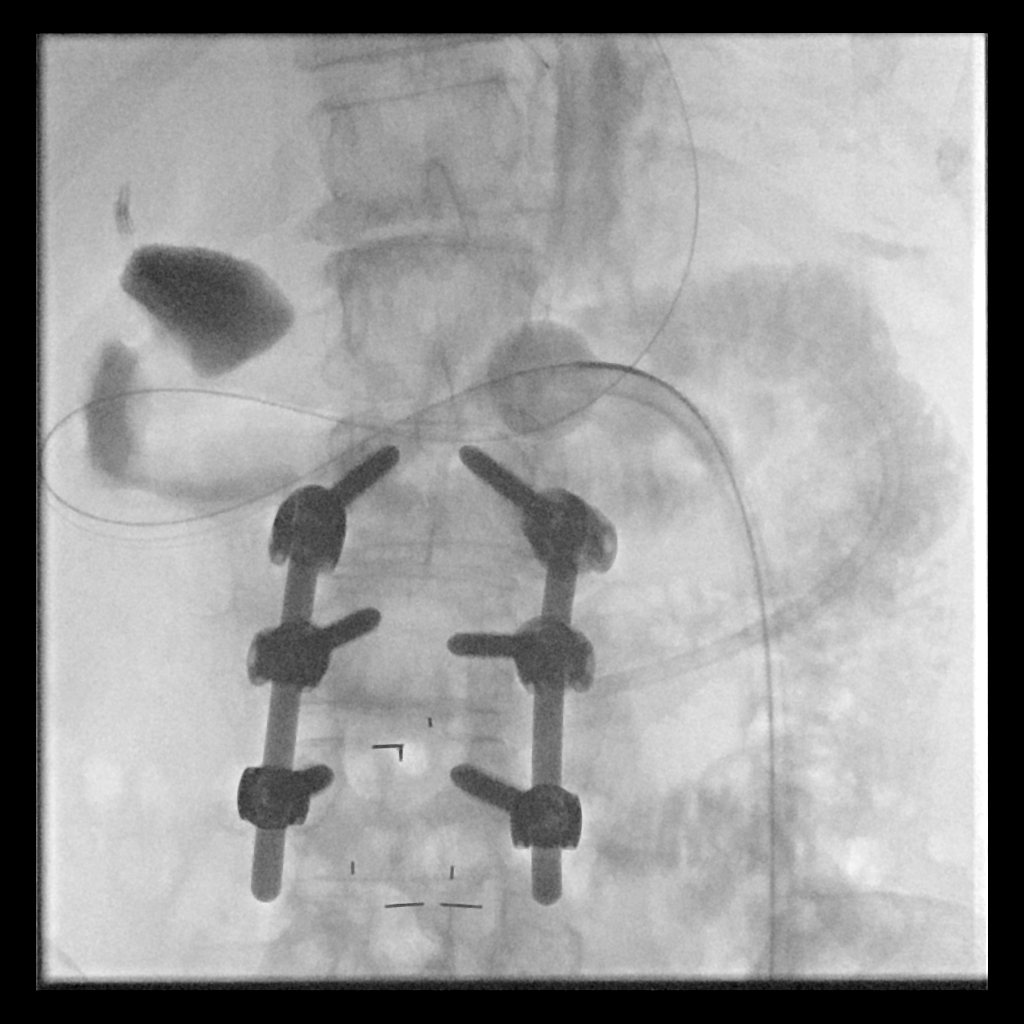

[Series 4: fl (-) angio · 1 of 1 slices shown (3 of 3)]
[im 1/1]
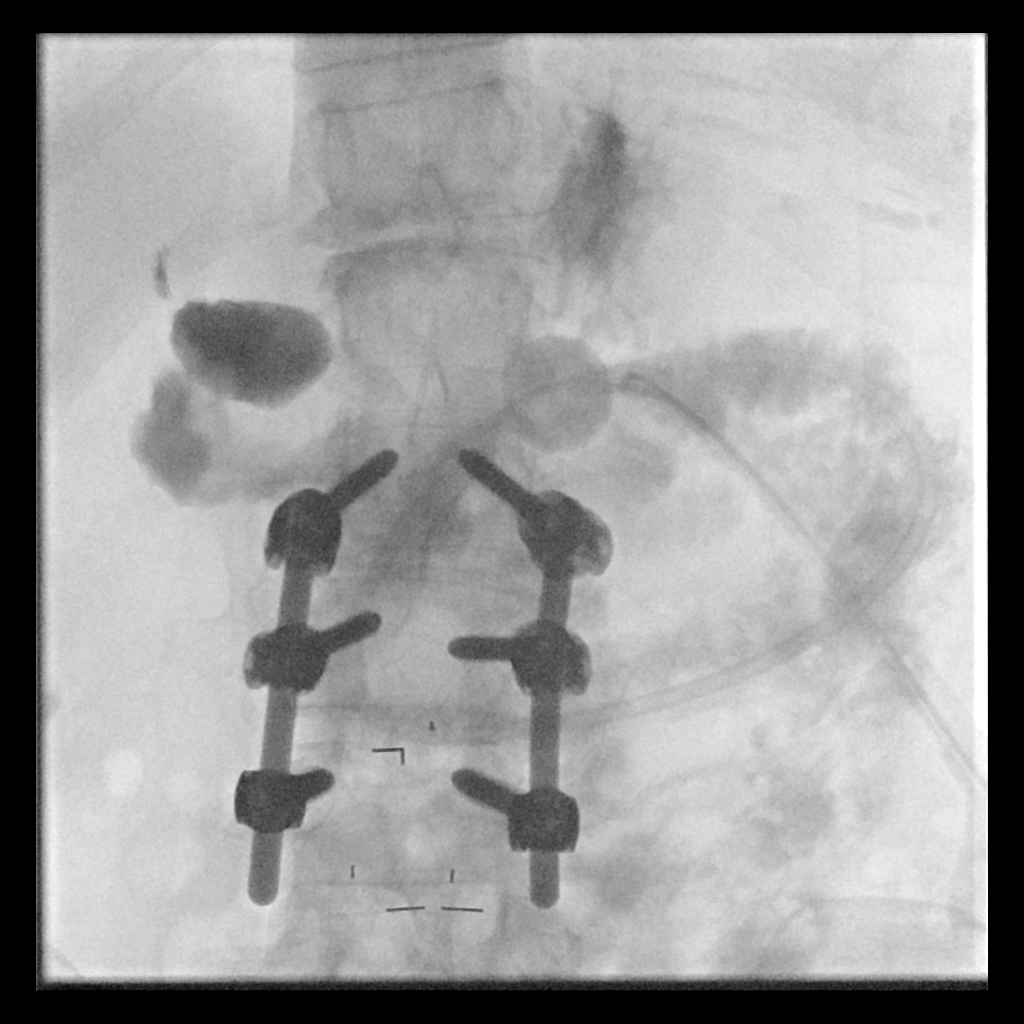

[3 of 3 positions shown; findings below may reference images not displayed]

MEDICATIONS:
None.

CONTRAST:  35 mL OMNIPAQUE IOHEXOL 300 MG/ML SOLN - administered
into the stomach and proximal small bowel

FLUOROSCOPY TIME:  7 minutes (138 mGy)

COMPLICATIONS:
None.

PROCEDURE:
Informed written consent was obtained from the patient after a
discussion of the risks and benefits. The upper abdomen and the
external portion of the existing gastrojejunostomy tube was prepped
and draped in the usual sterile fashion, and a sterile drape was
applied covering the operative field. Maximum barrier sterile
technique with sterile gowns and gloves were used for the procedure.
A timeout was performed prior to the initiation of the procedure.

Contrast was injected via the gastrostomy lumen demonstrating
appropriate positioning however there was complete occlusion the
jejunal limb refractory to attempted contrast injection despite
cannulation a stiff Glidewire.

As such, the external portion of gastrojejunostomy catheter was cut
and the balloon was deflated. The G-tube was slightly retracted and
then the jejunal lumen was cut and cannulated with a stiff Glidewire
which was advanced through the entirety of the remaining jejunal
limb to the level of proximal small bowel.

Next, under intermittent fluoroscopic guidance, the remainder of the
jejunal limb was exchanged for a Kumpe the catheter. Contrast
injection confirmed appropriate positioning within the proximal
small bowel.

Next, under intermittent fluoroscopic guidance, a new 30 French
balloon retention gastrojejunostomy catheter was placed with the
distal approximate 10 cm trimmed decrease the likelihood of early
recurrent occlusion.

After placement, the retention balloon was insufflated with
approximately 10 cc of saline and dilute contrast.

Contrast injection was performed via both lumens demonstrated
appropriate positioning and functionality.

A dressing was placed. The patient tolerated procedure well without
immediate postprocedural complication.
IMPRESSION: Successful fluoroscopic guided exchange of 30 French balloon
retention feeding gastrojejunostomy catheter. Both lumens of the
feeding tube are ready for immediate use.

## 2021-08-07 IMAGING — XA IR INT-EXT BILIARY DRAIN W/ CHOLANGIOGRAM
6 of 8 series · 13 of 19 positions shown · non-contrast
Comparison: none

INDICATION: 84-year-old female with a history obstructed common bile duct,
adenocarcinoma positive on brush biopsy

[Series 1: fl (-) angio · 1 of 1 slices shown (1 of 6)]
[im 1/1]
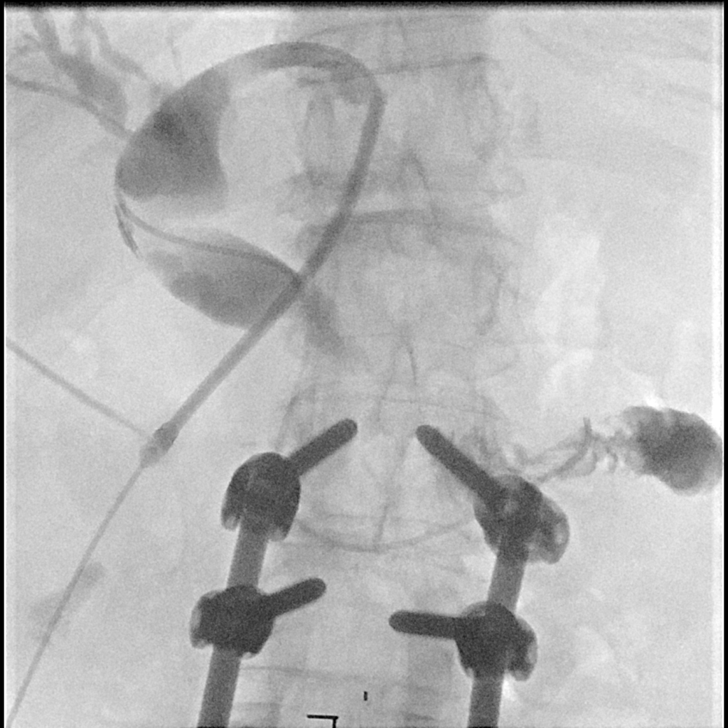

[Series 3: fl (-) angio · 1 of 1 slices shown (2 of 6)]
[im 1/1]
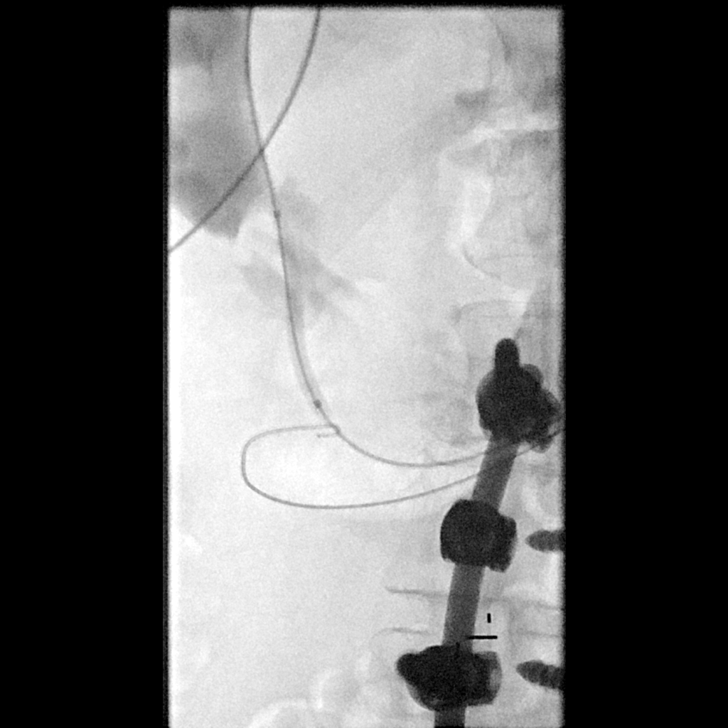

[Series 4: fl (-) angio · 1 of 1 slices shown (3 of 6)]
[im 1/1]
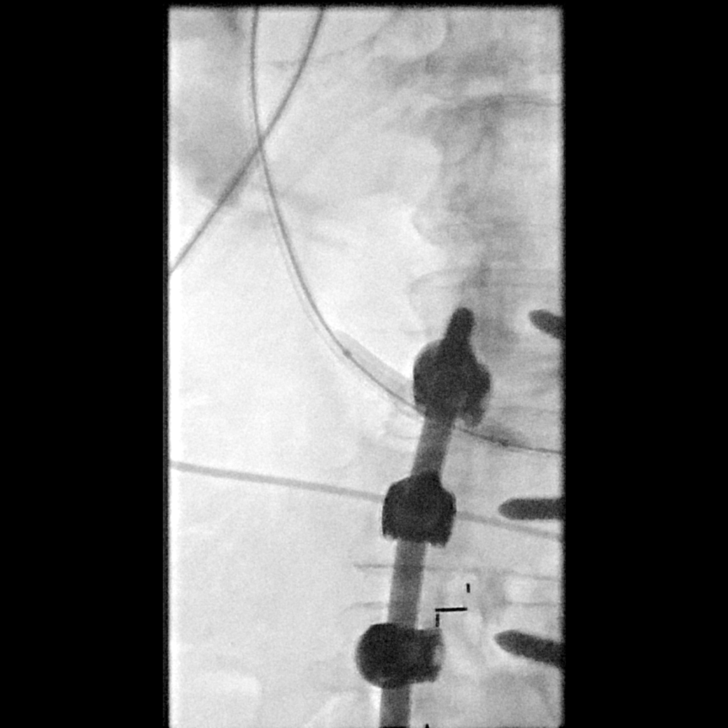

[Series 6: fl (-) angio · 1 of 1 slices shown (4 of 6)]
[im 1/1]
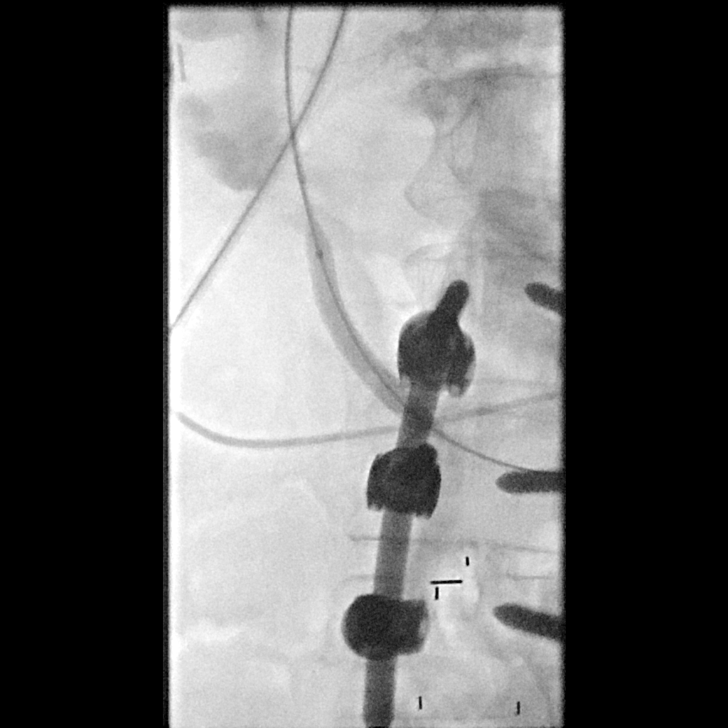

[Series 7: fl (-) angio · 1 of 1 slices shown (5 of 6)]
[im 1/1]
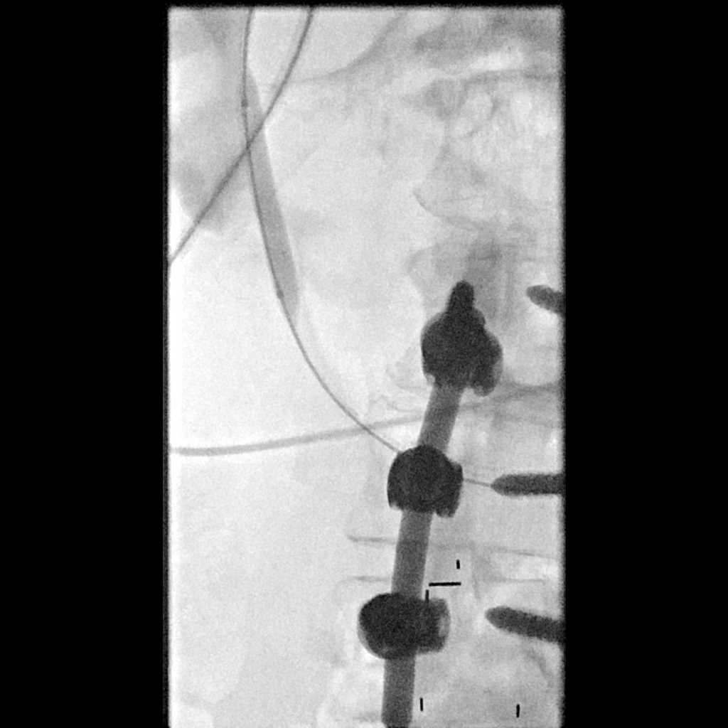

[Series 8: fl (-) angio · 9 acquisitions, 8 frames shown (6 of 6)]
[im 1/9]
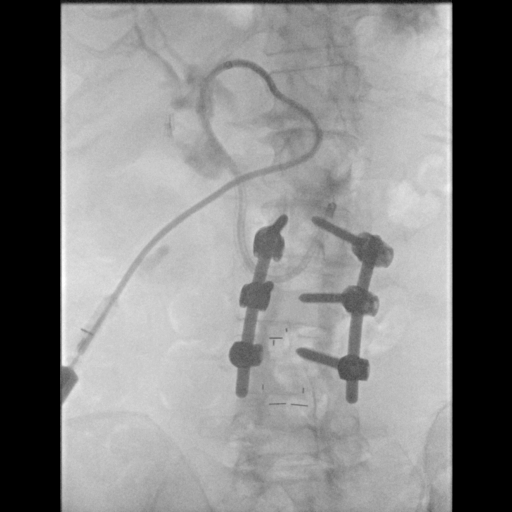
[im 1/9]
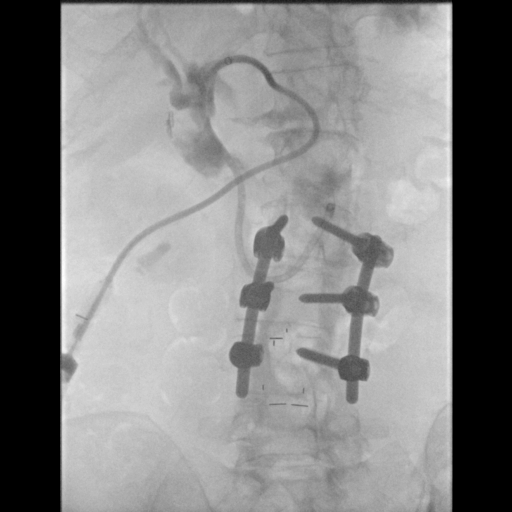
[im 1/9]
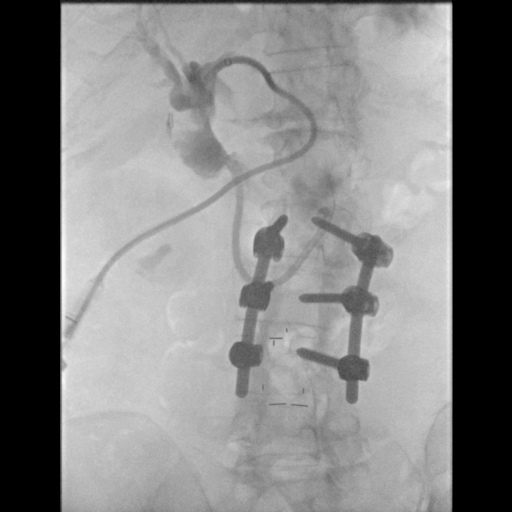
[im 3/9]
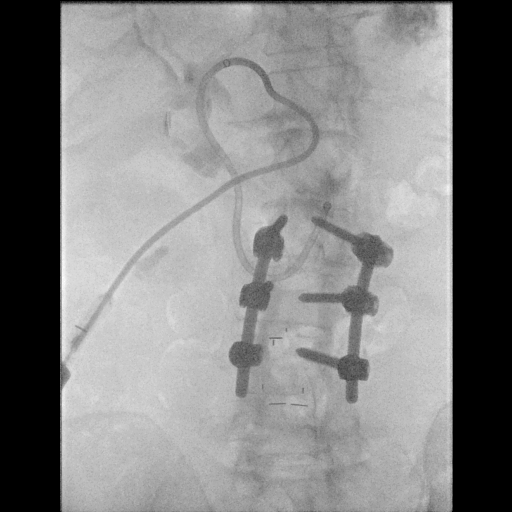
[im 4/9]
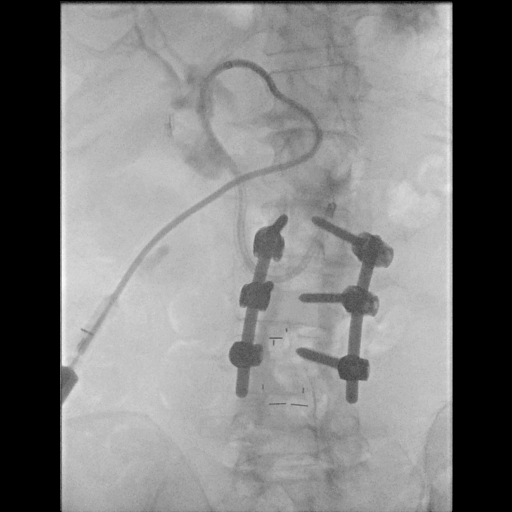
[im 6/9]
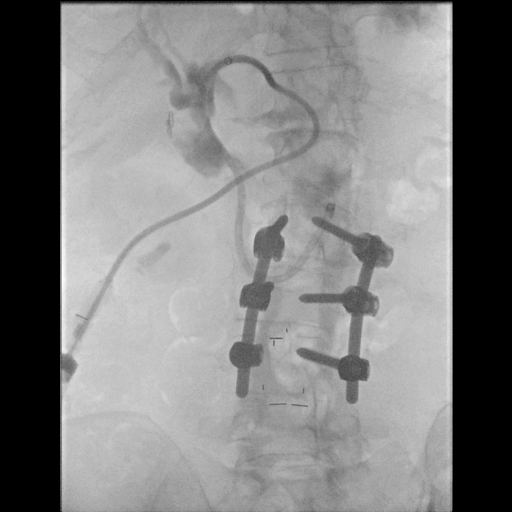
[im 7/9]
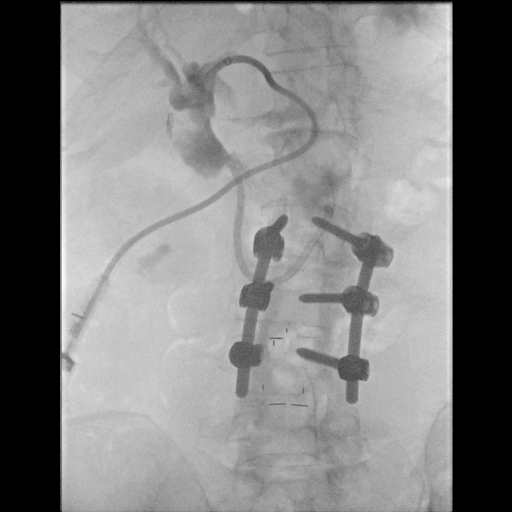
[im 9/9]
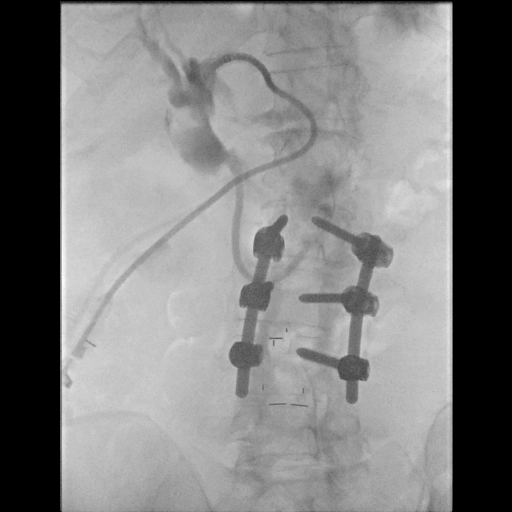

[13 of 19 positions shown; findings below may reference images not displayed]

EXAM:
EXCHANGE OF BILIARY DRAIN WITH PLACEMENT OF INTERNAL/EXTERNAL
BILIARY DRAIN

BALLOON ANGIOPLASTY OF MALIGNANT STRICTURE OF THE COMMON BILE DUCT

MEDICATIONS:
None

ANESTHESIA/SEDATION:
Moderate (conscious) sedation was employed during this procedure. A
total of Versed 5.0 mg and 2.5 mg Dilaudid was administered
intravenously.

Moderate Sedation Time: 37 minutes. The patient's level of
consciousness and vital signs were monitored continuously by
radiology nursing throughout the procedure under my direct
supervision.

FLUOROSCOPY TIME:  Fluoroscopy Time: 8 minutes 18 seconds (123.7
mGy).

COMPLICATIONS:
None

PROCEDURE:
Informed written consent was obtained from the patient after a
thorough discussion of the procedural risks, benefits and
alternatives. All questions were addressed. Maximal Sterile Barrier
Technique was utilized including caps, mask, sterile gowns, sterile
gloves, sterile drape, hand hygiene and skin antiseptic. A timeout
was performed prior to the initiation of the procedure.

Patient positioned supine position on the fluoroscopy table. Scout
images were acquired. The upper abdomen and the indwelling drain
were prepped and draped in the usual sterile fashion.

1% lidocaine was used for local anesthesia.

The indwelling catheter from a left-sided approach was ligated and a
Bentson wire was passed through the catheter into the dilated common
hepatic duct.

A 9 French short sheath was placed on the Bentson wire into
left-sided ductal system.

Combination of a Glidewire and a Kumpe the catheter were used to
navigate through the malignant obstruction, into the duodenum.
Gentle contrast injection confirmed location the duodenum.

Rose in wire was placed in the duodenum.

Eight French 55 cm introducer from a sheath was advanced over the
wire through the malignant stricture for dilation.

They internal external biliary drain was then attempted to pass on
the Rose an wire encountering resistance at the malignant stricture.

Balloon angioplasty was then performed with a 4 mm x 40 mm balloon
through the common bile duct and common hepatic duct.

Attempt at placement of the drain was unsuccessful encountering
resistance at the malignant stricture.

We then selected 6 mm x 40 mm balloon, passed on the Rose an wire
through the stricture into the duodenum. We then exchanged the Rose
an wire for an Amplatz wire, placed into the duodenum.

Balloon angioplasty of the malignant stricture was then performed
with the 6 mm balloon.

Balloon was removed and then the 12 French internal/external drain
was placed into the duodenum.

Contrast injection confirmed location.

The drain was sutured in position and attached to gravity drainage.
IMPRESSION: Status post exchange of internal biliary drain for a new 12 French
internal/external biliary drain, after balloon angioplasty of
malignant stricture of the common bile duct, 6 mm.
